# Patient Record
Sex: Male | Born: 1955 | Race: White | Hispanic: No | Marital: Married | Smoking: Former smoker
Health system: Southern US, Community
[De-identification: ages and names within clinical notes are randomized; demographics above are authoritative.]

## PROBLEM LIST (undated history)

## (undated) DIAGNOSIS — Z9289 Personal history of other medical treatment: Secondary | ICD-10-CM

## (undated) DIAGNOSIS — G4700 Insomnia, unspecified: Secondary | ICD-10-CM

## (undated) DIAGNOSIS — T7840XA Allergy, unspecified, initial encounter: Secondary | ICD-10-CM

## (undated) DIAGNOSIS — R030 Elevated blood-pressure reading, without diagnosis of hypertension: Secondary | ICD-10-CM

## (undated) DIAGNOSIS — I493 Ventricular premature depolarization: Secondary | ICD-10-CM

## (undated) DIAGNOSIS — E785 Hyperlipidemia, unspecified: Secondary | ICD-10-CM

## (undated) DIAGNOSIS — M549 Dorsalgia, unspecified: Secondary | ICD-10-CM

## (undated) DIAGNOSIS — K219 Gastro-esophageal reflux disease without esophagitis: Secondary | ICD-10-CM

## (undated) HISTORY — DX: Elevated blood-pressure reading, without diagnosis of hypertension: R03.0

## (undated) HISTORY — DX: Insomnia, unspecified: G47.00

## (undated) HISTORY — PX: TONSILLECTOMY: SUR1361

## (undated) HISTORY — DX: Gastro-esophageal reflux disease without esophagitis: K21.9

## (undated) HISTORY — PX: POLYPECTOMY: SHX149

## (undated) HISTORY — PX: CHOLECYSTECTOMY: SHX55

## (undated) HISTORY — DX: Ventricular premature depolarization: I49.3

## (undated) HISTORY — DX: Personal history of other medical treatment: Z92.89

## (undated) HISTORY — DX: Allergy, unspecified, initial encounter: T78.40XA

## (undated) HISTORY — DX: Dorsalgia, unspecified: M54.9

## (undated) HISTORY — DX: Hyperlipidemia, unspecified: E78.5

---

## 2003-04-24 DIAGNOSIS — Z9289 Personal history of other medical treatment: Secondary | ICD-10-CM

## 2003-04-24 HISTORY — DX: Personal history of other medical treatment: Z92.89

## 2005-07-06 ENCOUNTER — Ambulatory Visit: Payer: Self-pay | Admitting: Internal Medicine

## 2005-07-12 ENCOUNTER — Ambulatory Visit: Payer: Self-pay | Admitting: Internal Medicine

## 2005-07-19 ENCOUNTER — Ambulatory Visit: Payer: Self-pay | Admitting: Internal Medicine

## 2006-01-23 ENCOUNTER — Ambulatory Visit: Payer: Self-pay | Admitting: Internal Medicine

## 2006-02-06 ENCOUNTER — Ambulatory Visit: Payer: Self-pay | Admitting: Internal Medicine

## 2006-02-26 ENCOUNTER — Encounter: Admission: RE | Admit: 2006-02-26 | Discharge: 2006-02-26 | Payer: Self-pay | Admitting: Internal Medicine

## 2006-02-26 IMAGING — RF DG UGI W/ HIGH DENSITY W/KUB
19 of 22 series · 19 of 22 positions shown · non-contrast
Comparison: none

CLINICAL DATA: Difficulty swallowing.  Some reflux symptoms. 
 KUB:
 A preliminary film of the abdomen shows a nonspecific bowel gas pattern.  
 UPPER GI SERIES WITH HIGH DENSITY:
 A double contrast upper GI was performed.  The mucosa of the esophagus appears normal.  There is a small hiatal hernia present with moderate gastroesophageal reflux noted.  A barium pill was given at the end of the study which passed into the stomach without delay.  The stomach is normal in contour and peristalsis.   The duodenal bulb fills well with no ulceration but there is some duodenal bulb spasm present.  The duodenal loop is in normal position.

[Series 1: run · 1 of 1 slices shown (1 of 19)]
[im 1/1]
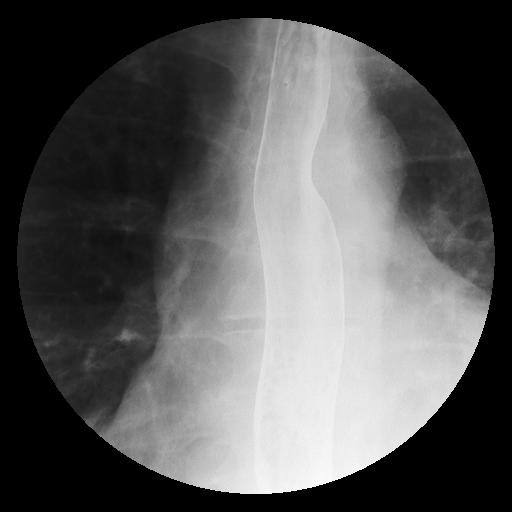

[Series 2: run · 1 of 1 slices shown (2 of 19)]
[im 1/1]
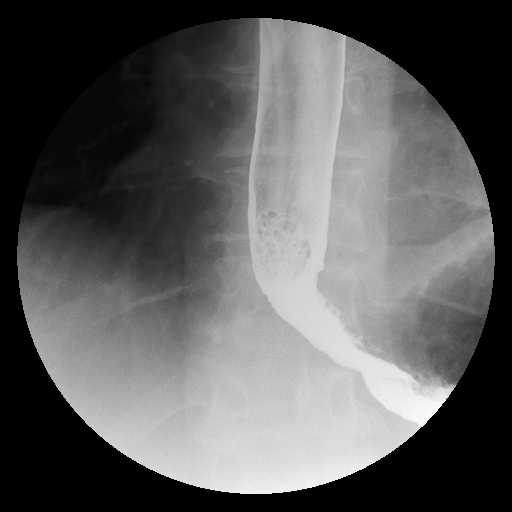

[Series 4: run · 1 of 1 slices shown (3 of 19)]
[im 1/1]
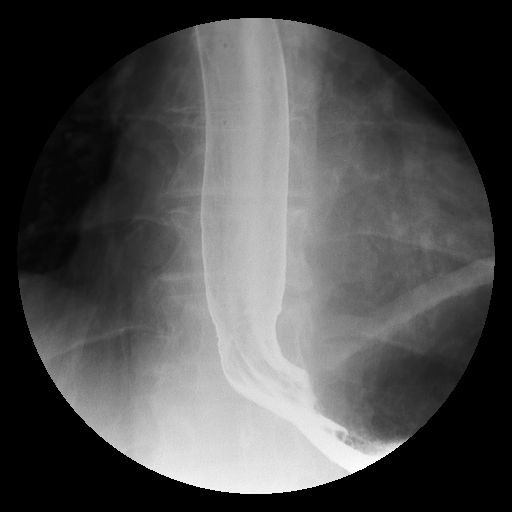

[Series 6: run · 1 of 1 slices shown (4 of 19)]
[im 1/1]
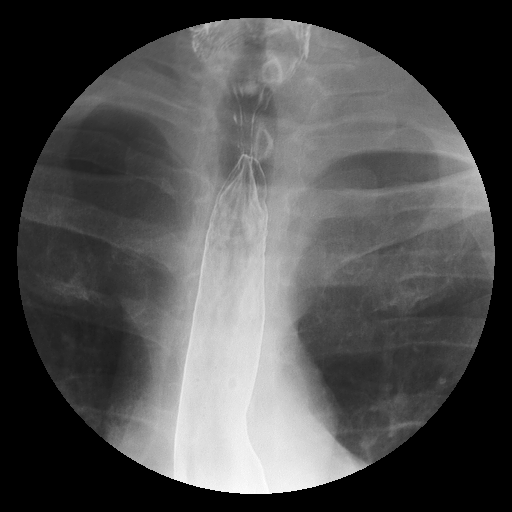

[Series 7: run · 1 of 1 slices shown (5 of 19)]
[im 1/1]
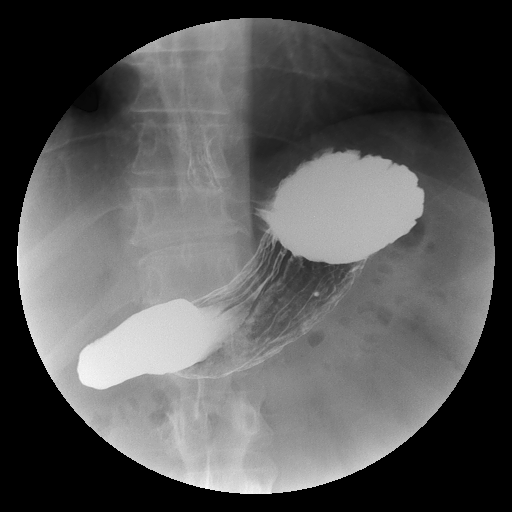

[Series 8: run · 1 of 1 slices shown (6 of 19)]
[im 1/1]
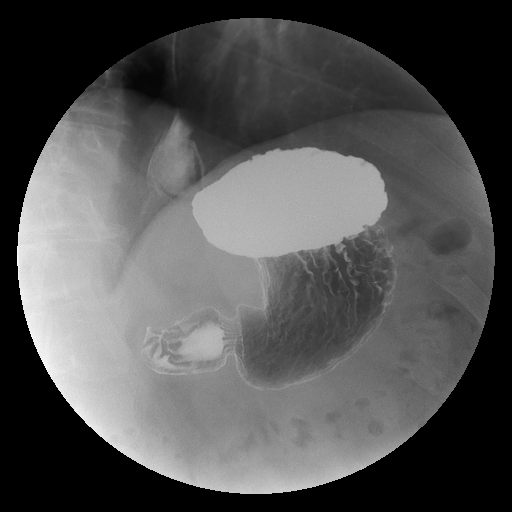

[Series 9: run · 1 of 1 slices shown (7 of 19)]
[im 1/1]
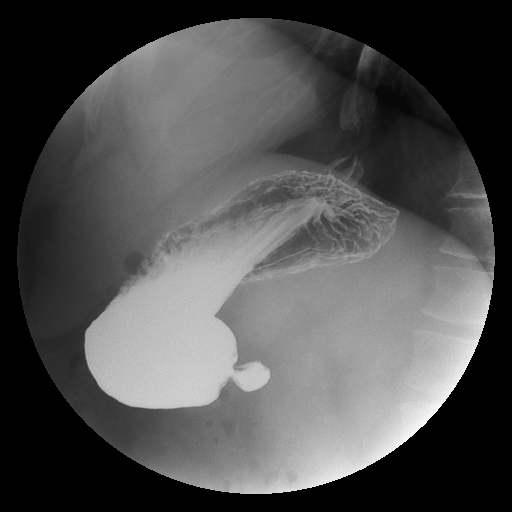

[Series 10: run · 1 of 1 slices shown (8 of 19)]
[im 1/1]
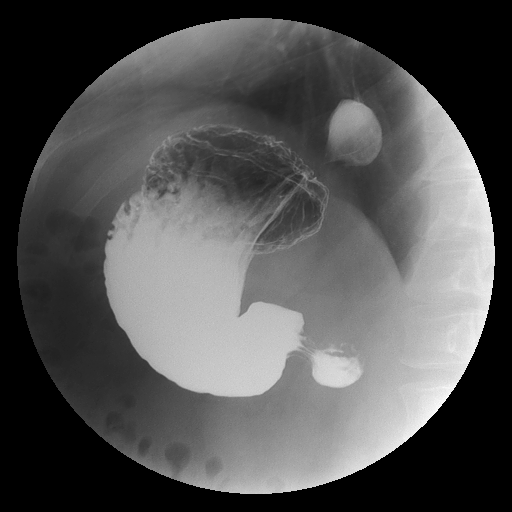

[Series 11: run · 1 of 1 slices shown (9 of 19)]
[im 1/1]
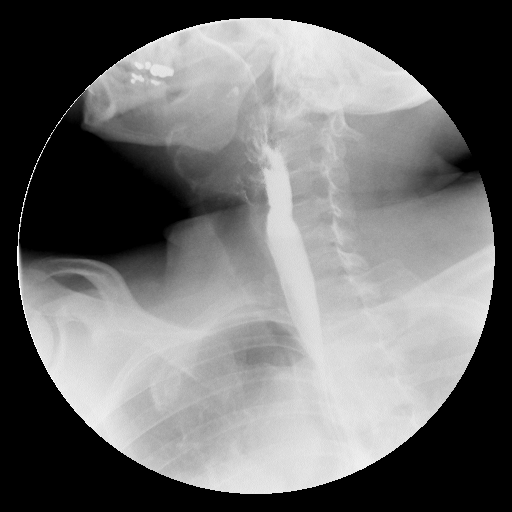

[Series 13: run · 1 of 1 slices shown (10 of 19)]
[im 1/1]
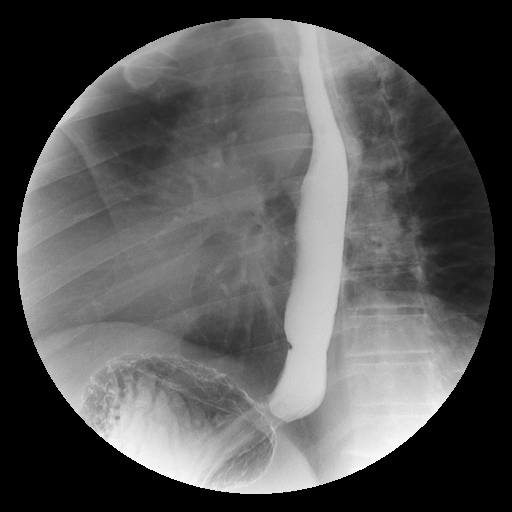

[Series 14: run · 1 of 1 slices shown (11 of 19)]
[im 1/1]
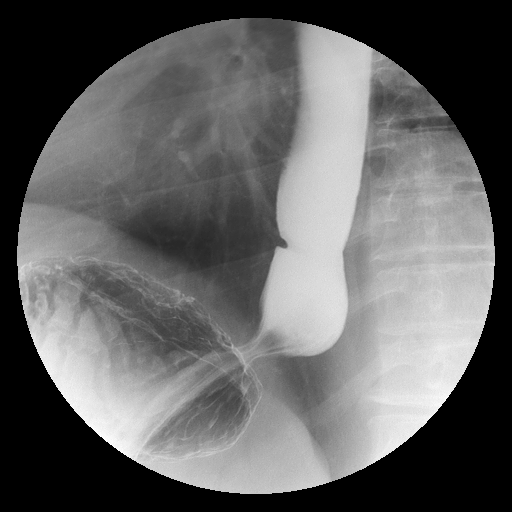

[Series 15: run · 1 of 1 slices shown (12 of 19)]
[im 1/1]
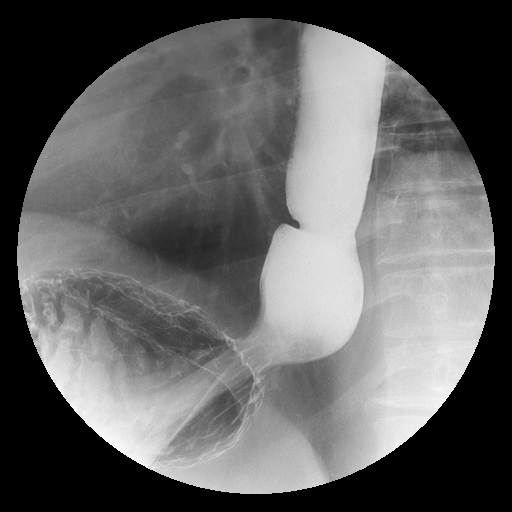

[Series 16: run · 1 of 1 slices shown (13 of 19)]
[im 1/1]
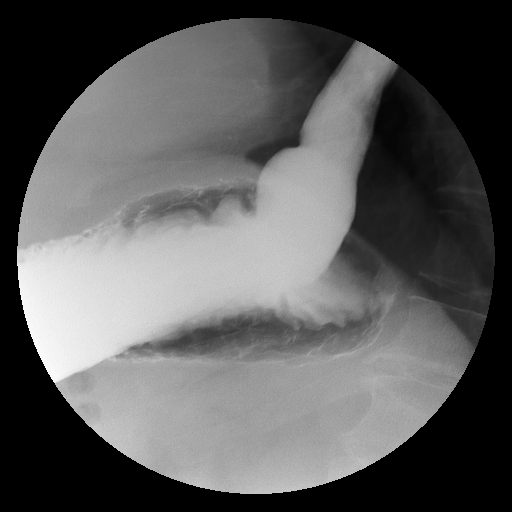

[Series 17: run · 1 of 1 slices shown (14 of 19)]
[im 1/1]
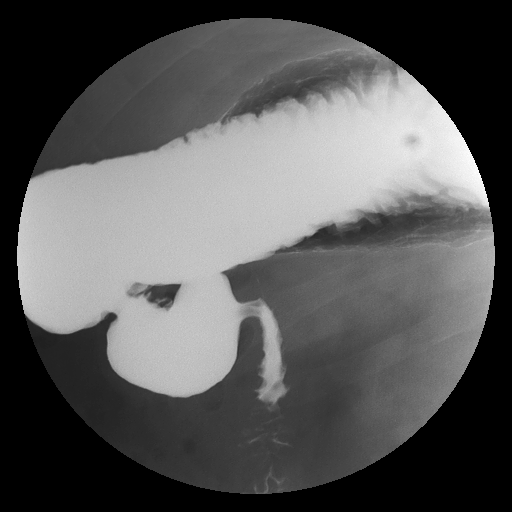

[Series 18: run · 1 of 1 slices shown (15 of 19)]
[im 1/1]
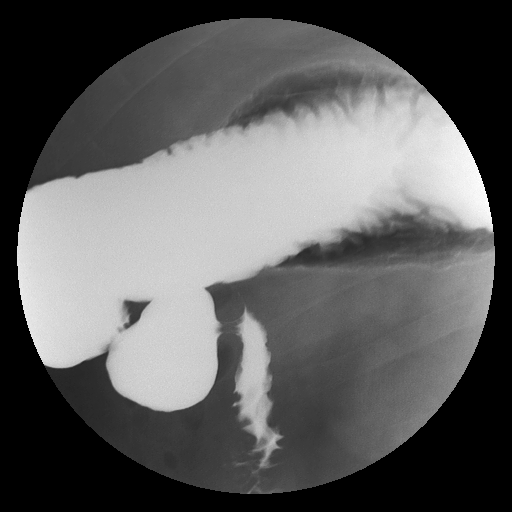

[Series 20: run · 1 of 1 slices shown (16 of 19)]
[im 1/1]
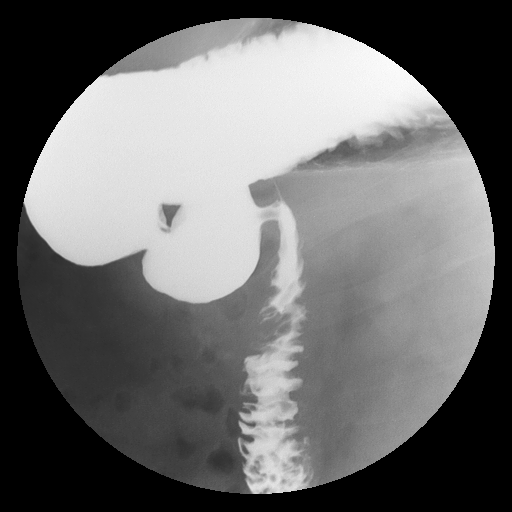

[Series 22: run · 1 of 1 slices shown (17 of 19)]
[im 1/1]
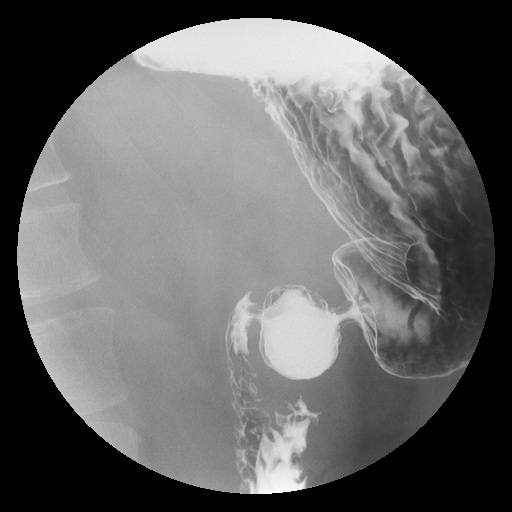

[Series 23: run · 1 of 1 slices shown (18 of 19)]
[im 1/1]
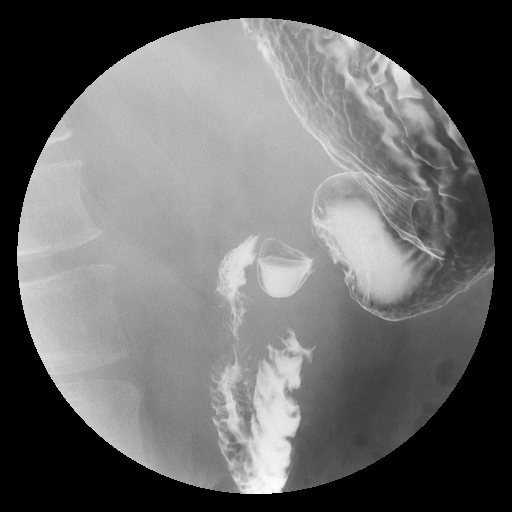

[Series 24: run · 1 of 1 slices shown (19 of 19)]
[im 1/1]
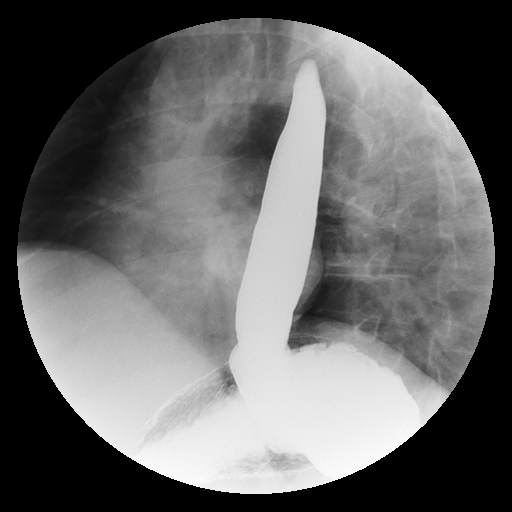

[19 of 22 positions shown; findings below may reference images not displayed]

IMPRESSION: 1.  Small hiatal hernia with moderate reflux.  Barium pill passes into the stomach without delay.
 2.  Duodenal bulb spasm but no ulcer disease is seen.

## 2006-02-26 IMAGING — CR DG UGI W/ HIGH DENSITY W/KUB
1 series · 1 of 1 positions shown · non-contrast
Comparison: none

CLINICAL DATA: Difficulty swallowing.  Some reflux symptoms. 
 KUB:
 A preliminary film of the abdomen shows a nonspecific bowel gas pattern.  
 UPPER GI SERIES WITH HIGH DENSITY:
 A double contrast upper GI was performed.  The mucosa of the esophagus appears normal.  There is a small hiatal hernia present with moderate gastroesophageal reflux noted.  A barium pill was given at the end of the study which passed into the stomach without delay.  The stomach is normal in contour and peristalsis.   The duodenal bulb fills well with no ulceration but there is some duodenal bulb spasm present.  The duodenal loop is in normal position.

[view not recorded]
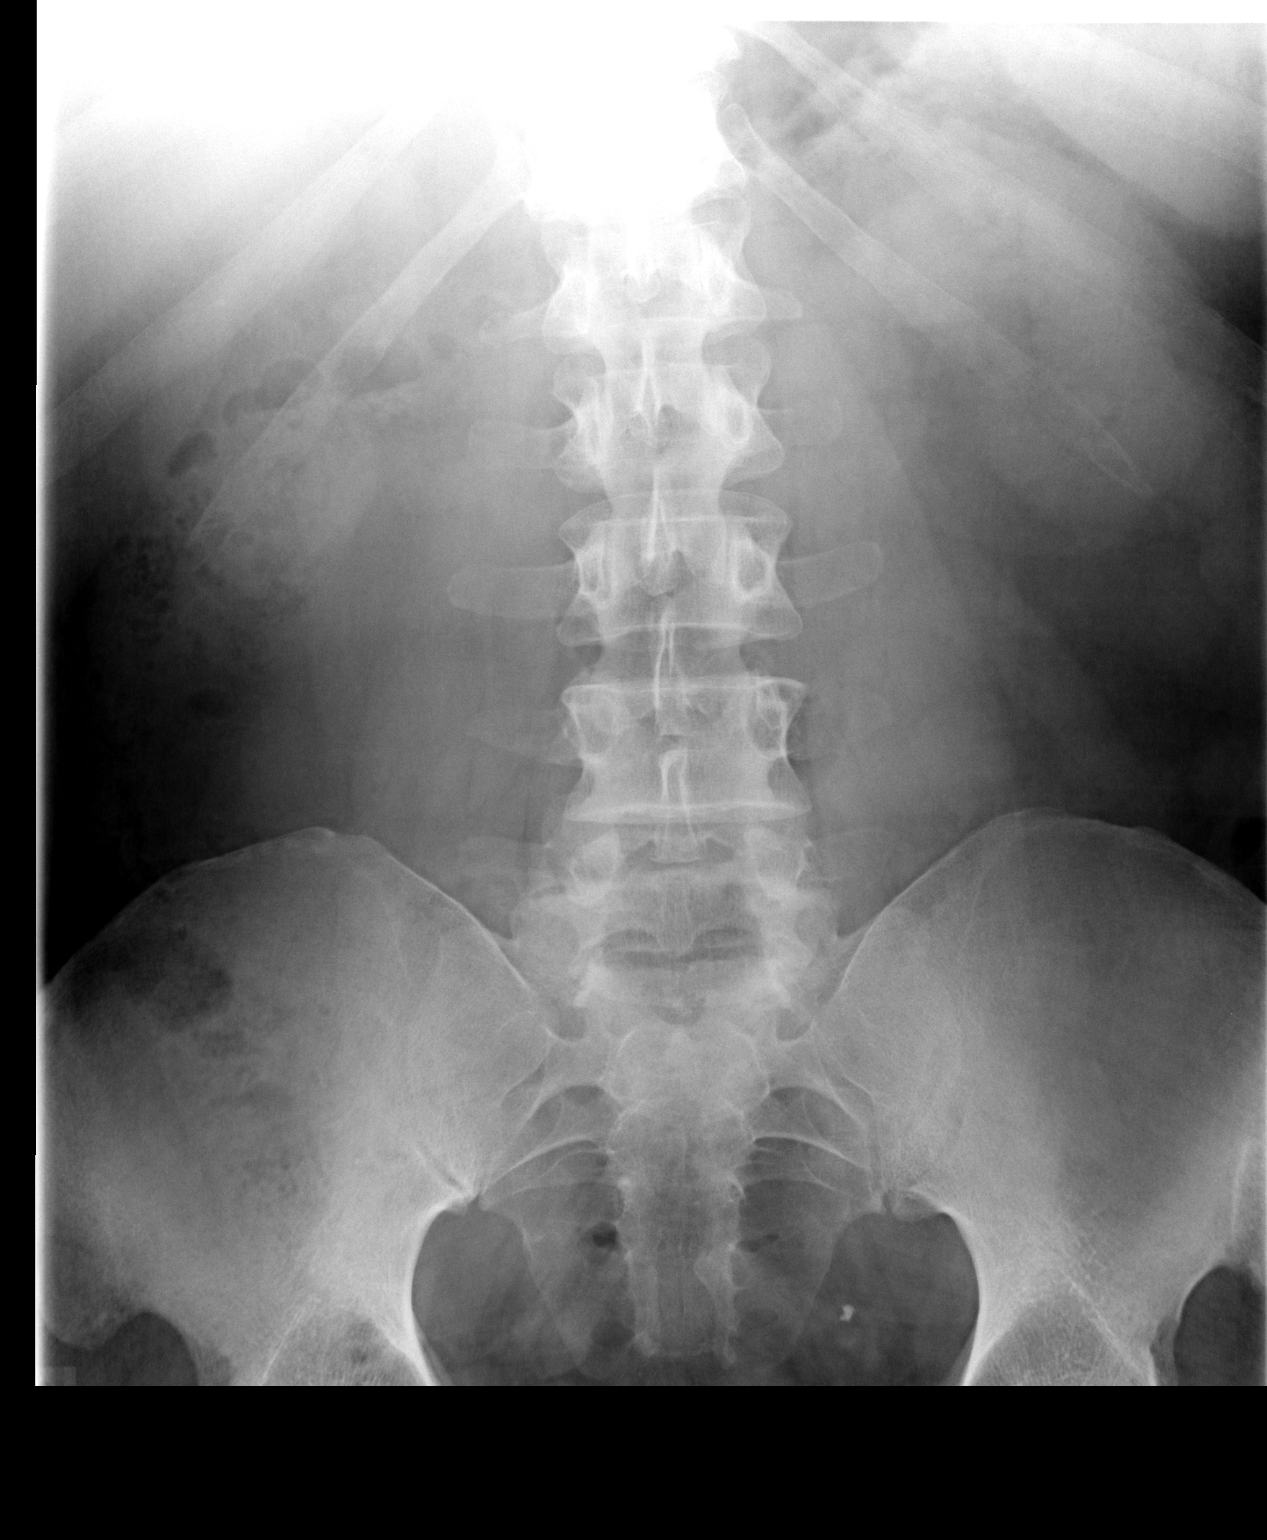

[1 of 1 positions shown; findings below may reference images not displayed]

IMPRESSION: 1.  Small hiatal hernia with moderate reflux.  Barium pill passes into the stomach without delay.
 2.  Duodenal bulb spasm but no ulcer disease is seen.

## 2006-08-13 DIAGNOSIS — E785 Hyperlipidemia, unspecified: Secondary | ICD-10-CM | POA: Insufficient documentation

## 2008-05-03 ENCOUNTER — Ambulatory Visit: Payer: Self-pay | Admitting: Internal Medicine

## 2008-05-03 ENCOUNTER — Encounter (INDEPENDENT_AMBULATORY_CARE_PROVIDER_SITE_OTHER): Payer: Self-pay | Admitting: *Deleted

## 2008-05-03 DIAGNOSIS — R030 Elevated blood-pressure reading, without diagnosis of hypertension: Secondary | ICD-10-CM | POA: Insufficient documentation

## 2008-05-03 HISTORY — DX: Elevated blood-pressure reading, without diagnosis of hypertension: R03.0

## 2008-05-05 LAB — CONVERTED CEMR LAB
ALT: 23 units/L (ref 0–53)
BUN: 19 mg/dL (ref 6–23)
Basophils Relative: 0 % (ref 0.0–3.0)
CO2: 29 meq/L (ref 19–32)
Calcium: 9.6 mg/dL (ref 8.4–10.5)
Creatinine, Ser: 1 mg/dL (ref 0.4–1.5)
Eosinophils Relative: 1.4 % (ref 0.0–5.0)
Glucose, Bld: 93 mg/dL (ref 70–99)
HCT: 42.3 % (ref 39.0–52.0)
Hemoglobin: 14 g/dL (ref 13.0–17.0)
Lymphocytes Relative: 29.1 % (ref 12.0–46.0)
Monocytes Absolute: 0.3 10*3/uL (ref 0.1–1.0)
Monocytes Relative: 6.9 % (ref 3.0–12.0)
Neutro Abs: 2.8 10*3/uL (ref 1.4–7.7)
RBC: 4.85 M/uL (ref 4.22–5.81)
TSH: 1.45 microintl units/mL (ref 0.35–5.50)
Total CHOL/HDL Ratio: 4.8
VLDL: 19 mg/dL (ref 0–40)

## 2008-06-08 ENCOUNTER — Ambulatory Visit: Payer: Self-pay | Admitting: Internal Medicine

## 2008-06-28 ENCOUNTER — Ambulatory Visit: Payer: Self-pay | Admitting: Internal Medicine

## 2008-07-02 ENCOUNTER — Encounter (INDEPENDENT_AMBULATORY_CARE_PROVIDER_SITE_OTHER): Payer: Self-pay | Admitting: *Deleted

## 2008-07-02 LAB — CONVERTED CEMR LAB: Fecal Occult Bld: NEGATIVE

## 2008-12-10 ENCOUNTER — Ambulatory Visit: Payer: Self-pay | Admitting: Internal Medicine

## 2010-03-28 ENCOUNTER — Ambulatory Visit: Payer: Self-pay | Admitting: Internal Medicine

## 2010-03-28 LAB — CONVERTED CEMR LAB
Inflenza A Ag: NEGATIVE
Influenza B Ag: NEGATIVE
Rapid Strep: NEGATIVE

## 2010-05-12 ENCOUNTER — Ambulatory Visit: Admit: 2010-05-12 | Payer: Self-pay | Admitting: Internal Medicine

## 2010-05-23 NOTE — Assessment & Plan Note (Signed)
Summary: flu-like symptoms/cbs   Vital Signs:  Patient profile:   55 year old male Height:      71 inches Weight:      216.13 pounds BMI:     30.25 O2 Sat:      97 % on Room air Temp:     99.8 degrees F oral Pulse rate:   83 / minute Pulse rhythm:   regular BP sitting:   122 / 80  (left arm) Cuff size:   large  Vitals Entered By: Army Fossa CMA (March 28, 2010 3:48 PM)  O2 Flow:  Room air CC: Pt here c/o flu like symptoms. Comments - chills - achey -dizziness - x 1 week [facility] [address]   History of Present Illness: "mild flu symptoms" x 1 week: aches, chils at night , slightly  dizzy not taking any meds   Current Medications (verified): 1)  None  Allergies (verified): No Known Drug Allergies  Past History:  Past Medical History: Reviewed history from 08/13/2006 and no changes required. Hyperlipidemia  Past Surgical History: Reviewed history from 05/03/2008 and no changes required. CARDIOLITE - NEG 2005  Social History: Married 2 kids tobacco--no  Review of Systems General:  mild fever  ~100 on- off. ENT:  Denies sore throat; no sinus congestion. CV:  mild cough, no sputum  no chest congestion . GI:  Denies nausea and vomiting. Neuro:  HA x 2 days , no intense .  Physical Exam  General:  alert, well-developed, and well-nourished.   Head:  face symetric , no tender  Ears:  R ear normal and L ear normal.   Nose:  not congested  Mouth:  tonsils absent, no d/c,slt red  Lungs:  normal respiratory effort, no intercostal retractions, no accessory muscle use, and normal breath sounds.   Heart:  normal rate, regular rhythm, and no murmur.     Impression & Recommendations:  Problem # 1:  VIRAL INFECTION-UNSPEC (ICD-079.99)  likely a viral illnes flu test neg check a strep test--- neg  see instructions  if no better consider a CBC ,  CXR  Orders: Flu A+B (16109) Rapid Strep (60454)  Patient Instructions: 1)  rest, fluids, tylenol ,  ibuprofen 2)  mucinex DM twice a day as needed 3)  call if no better in 4 to 5 days    Orders Added: 1)  Flu A+B [87400] 2)  Est. Patient Level III [09811] 3)  Rapid Strep [91478]    Laboratory Results    Other Tests  Rapid Strep: negative Influenza A: negative Influenza B: negative Comments: Army Fossa CMA  March 28, 2010 3:50 PM

## 2010-06-07 ENCOUNTER — Encounter: Payer: Self-pay | Admitting: Internal Medicine

## 2010-06-07 ENCOUNTER — Other Ambulatory Visit: Payer: Self-pay | Admitting: Internal Medicine

## 2010-06-07 ENCOUNTER — Encounter (INDEPENDENT_AMBULATORY_CARE_PROVIDER_SITE_OTHER): Payer: BC Managed Care – PPO | Admitting: Internal Medicine

## 2010-06-07 DIAGNOSIS — G4700 Insomnia, unspecified: Secondary | ICD-10-CM | POA: Insufficient documentation

## 2010-06-07 DIAGNOSIS — E785 Hyperlipidemia, unspecified: Secondary | ICD-10-CM

## 2010-06-07 DIAGNOSIS — Z0000 Encounter for general adult medical examination without abnormal findings: Secondary | ICD-10-CM

## 2010-06-07 LAB — BASIC METABOLIC PANEL
CO2: 28 mEq/L (ref 19–32)
Calcium: 9 mg/dL (ref 8.4–10.5)
Creatinine, Ser: 0.9 mg/dL (ref 0.4–1.5)
GFR: 89.67 mL/min (ref >60.00)

## 2010-06-07 LAB — LIPID PANEL
HDL: 40.8 mg/dL (ref >39.00)
Total CHOL/HDL Ratio: 6
VLDL: 42.2 mg/dL — ABNORMAL HIGH (ref 0.0–40.0)

## 2010-06-07 LAB — LDL CHOLESTEROL, DIRECT: Direct LDL: 156.9 mg/dL

## 2010-06-07 LAB — HEPATIC FUNCTION PANEL
Alkaline Phosphatase: 43 U/L (ref 39–117)
Bilirubin, Direct: 0.1 mg/dL (ref 0.0–0.3)

## 2010-06-08 LAB — CBC WITH DIFFERENTIAL/PLATELET
Basophils Absolute: 0 10*3/uL (ref 0.0–0.1)
Basophils Relative: 0.4 % (ref 0.0–3.0)
Eosinophils Absolute: 0.1 10*3/uL (ref 0.0–0.7)
Lymphocytes Relative: 38.1 % (ref 12.0–46.0)
MCHC: 34.8 g/dL (ref 30.0–36.0)
Neutrophils Relative %: 51.5 % (ref 43.0–77.0)
RBC: 4.7 Mil/uL (ref 4.22–5.81)

## 2010-06-14 NOTE — Assessment & Plan Note (Signed)
Summary: CPX AND FASTING LABS/SPH/PH   Vital Signs:  Patient profile:   55 year old male Height:      71 inches Weight:      211.50 pounds BMI:     29.60 Pulse rate:   78 / minute Pulse rhythm:   regular BP sitting:   128 / 84  (left arm) Cuff size:   large  Vitals Entered By: Army Fossa CMA (June 07, 2010 8:31 AM) CC: CPX, fasting  Comments no complaints  [facility] [address]  not had colonoscopy   History of Present Illness: CPX  Preventive Screening-Counseling & Management  Caffeine-Diet-Exercise     Does Patient Exercise: yes     Times/week: 3  Current Medications (verified): 1)  None  Allergies (verified): No Known Drug Allergies  Past History:  Past Medical History: Reviewed history from 08/13/2006 and no changes required. Hyperlipidemia  Past Surgical History: Reviewed history from 05/03/2008 and no changes required. CARDIOLITE - NEG 2005  Family History: CAD-- F, dx in his 37s. Smoker  hyperlipidemia -- F, M stroke - PGM lung Ca--F   HTN-- M DM -- distant family  colon Ca - no prostate Ca - no  Social History: Married 2 kids tobacco--no ETOH-- socially exercise-- 2-3/week [employer]) diet--  eats well most of the time  job-- Research scientist (medical) , travels a lot Does Patient Exercise:  yes  Review of Systems CV:  Denies chest pain or discomfort, palpitations, and swelling of feet. Resp:  Denies shortness of breath; occasionally cough @ night and chest congestion (sporadically) wonders if is a enviromental issue @ home . GI:  Denies bloody stools, diarrhea, nausea, and vomiting. GU:  Denies dysuria, hematuria, urinary frequency, and urinary hesitancy. Psych:  Denies anxiety and depression.  Physical Exam  General:  alert, well-developed, and well-nourished.   Neck:  no masses, no thyromegaly, and normal carotid upstroke.   Lungs:  normal respiratory effort, no intercostal retractions, no accessory muscle use, and normal breath  sounds.   Heart:  normal rate, regular rhythm, and no murmur.   Abdomen:  soft, non-tender, no distention, no masses, no guarding, and no rigidity.   Rectal:  few external hemorrhoids and skin tags noted. Normal sphincter tone. No rectal masses or tenderness. Brown stool, Hemoccult negative Prostate:  Prostate gland firm and smooth, no enlargement, nodularity, tenderness, mass, asymmetry or induration. Extremities:  no lower extremity edema   Impression & Recommendations:  Problem # 1:  HEALTH SCREENING (ICD-V70.0)  Td 07 had a flu shot  never had a cscope Hemoccult negative today, would like to schedule a colonoscopy for the summer,  referral done   Has a family history of heart disease, LDL goal should be less than 130, hopefully 100. Labs. The patient will consider statins if needed. diet &  exercise discussed EKG normal sinus rhythm, unchanged compared to previous EKGs  Orders: Venipuncture (16109) TLB-BMP (Basic Metabolic Panel-BMET) (80048-METABOL) TLB-CBC Platelet - w/Differential (85025-CBCD) TLB-Hepatic/Liver Function Pnl (80076-HEPATIC) TLB-Lipid Panel (80061-LIPID) TLB-PSA (Prostate Specific Antigen) (84153-PSA) TLB-TSH (Thyroid Stimulating Hormone) (84443-TSH) Specimen Handling (60454) EKG w/ Interpretation (93000) Gastroenterology Referral (GI)  Problem # 2:  INSOMNIA-SLEEP DISORDER-UNSPEC (ICD-780.52)  due to his job, he travels to [place] a lote, likes a sleep aid. In the past he tried [place] and he left a metallic taste  Does not like Xanax particularly trial with Ambien  His updated medication list for this problem includes:    Zolpidem Tartrate 10 Mg Tabs (Zolpidem tartrate) .Marland KitchenMarland KitchenMarland KitchenMarland Kitchen 1  at night as needed for insomnia  Patient Instructions: 1)  Please schedule a follow-up appointment in 1 year.  Prescriptions: ZOLPIDEM TARTRATE 10 MG TABS (ZOLPIDEM TARTRATE) 1 at night as needed for insomnia  #30 x 1   Entered and Authorized by:   Elita Quick E. Regan Mcbryar MD   Signed  by:   Nolon Rod. Kodi Guerrera MD on 06/07/2010   Method used:   Print then Give to Patient   RxID:   [id number]    Orders Added: 1)  Venipuncture [56213] 2)  TLB-BMP (Basic Metabolic Panel-BMET) [80048-METABOL] 3)  TLB-CBC Platelet - w/Differential [85025-CBCD] 4)  TLB-Hepatic/Liver Function Pnl [80076-HEPATIC] 5)  TLB-Lipid Panel [80061-LIPID] 6)  TLB-PSA (Prostate Specific Antigen) [84153-PSA] 7)  TLB-TSH (Thyroid Stimulating Hormone) [84443-TSH] 8)  Specimen Handling [99000] 9)  EKG w/ Interpretation [93000] 10)  Est. Patient age 4-64 [phone] 4)  Gastroenterology Referral [GI]   Immunization History:  Influenza Immunization History:    Influenza:  historical (01/21/2010)   Immunization History:  Influenza Immunization History:    Influenza:  Historical (01/21/2010)   Risk Factors:  Exercise:  yes    Times per week:  3

## 2010-07-21 ENCOUNTER — Other Ambulatory Visit (INDEPENDENT_AMBULATORY_CARE_PROVIDER_SITE_OTHER): Payer: BC Managed Care – PPO

## 2010-07-21 DIAGNOSIS — E785 Hyperlipidemia, unspecified: Secondary | ICD-10-CM

## 2010-07-21 LAB — ALT: ALT: 32 U/L (ref 0–53)

## 2010-07-21 LAB — LIPID PANEL
Cholesterol: 188 mg/dL (ref 0–200)
HDL: 39 mg/dL — ABNORMAL LOW (ref >39.00)
LDL Cholesterol: 109 mg/dL — ABNORMAL HIGH (ref 0–99)
Total CHOL/HDL Ratio: 5
Triglycerides: 199 mg/dL — ABNORMAL HIGH (ref 0.0–149.0)
VLDL: 39.8 mg/dL (ref 0.0–40.0)

## 2010-07-24 ENCOUNTER — Telehealth: Payer: Self-pay | Admitting: *Deleted

## 2010-07-24 MED ORDER — ATORVASTATIN CALCIUM 20 MG PO TABS: 20.0000 mg | ORAL_TABLET | Freq: Every day | ORAL | Status: DC

## 2010-07-24 NOTE — Telephone Encounter (Signed)
Message copied by Army Fossa on Mon Jul 24, 2010  3:16 PM ------      Message from: Willow Ora      Created: Mon Jul 24, 2010  2:48 PM       Advised patient      His cholesterol has improved significantly with Lipitor 20 mg daily.      The LDL is now 109, well below 130.      Triglycerides are still elevated      I recommend him to continue with Lipitor, come back in 8 months for a checkup.      Call a 1 year supply of meds

## 2010-07-24 NOTE — Telephone Encounter (Signed)
Message left for patient to return my call.  

## 2010-07-24 NOTE — Telephone Encounter (Signed)
Pt aware of labs and rx sent to pharmacy.

## 2010-09-25 ENCOUNTER — Ambulatory Visit (AMBULATORY_SURGERY_CENTER): Payer: BC Managed Care – PPO | Admitting: *Deleted

## 2010-09-25 ENCOUNTER — Encounter: Payer: Self-pay | Admitting: Gastroenterology

## 2010-09-25 VITALS — Ht 71.0 in | Wt 212.2 lb

## 2010-09-25 DIAGNOSIS — Z1211 Encounter for screening for malignant neoplasm of colon: Secondary | ICD-10-CM

## 2010-09-25 MED ORDER — PEG-KCL-NACL-NASULF-NA ASC-C 100 G PO SOLR: ORAL | Status: DC

## 2010-10-09 ENCOUNTER — Ambulatory Visit (AMBULATORY_SURGERY_CENTER): Payer: BC Managed Care – PPO | Admitting: Gastroenterology

## 2010-10-09 ENCOUNTER — Encounter: Payer: Self-pay | Admitting: Gastroenterology

## 2010-10-09 VITALS — BP 146/71 | HR 67 | Temp 97.0°F | Resp 20 | Ht 71.0 in | Wt 211.0 lb

## 2010-10-09 DIAGNOSIS — D126 Benign neoplasm of colon, unspecified: Secondary | ICD-10-CM

## 2010-10-09 DIAGNOSIS — Z139 Encounter for screening, unspecified: Secondary | ICD-10-CM

## 2010-10-09 DIAGNOSIS — Z1211 Encounter for screening for malignant neoplasm of colon: Secondary | ICD-10-CM

## 2010-10-09 MED ORDER — SODIUM CHLORIDE 0.9 % IV SOLN: 500.0000 mL | INTRAVENOUS | Status: DC

## 2010-10-09 NOTE — Patient Instructions (Signed)
Green and blue discharge instructions reviewed with patient and care partner.  Handout on polyps given.  Continue medications as you were previously taking them prior to your procedure.

## 2010-10-10 ENCOUNTER — Telehealth: Payer: Self-pay | Admitting: *Deleted

## 2010-10-10 NOTE — Telephone Encounter (Signed)

## 2011-03-22 ENCOUNTER — Encounter (HOSPITAL_BASED_OUTPATIENT_CLINIC_OR_DEPARTMENT_OTHER): Payer: Self-pay | Admitting: *Deleted

## 2011-03-22 ENCOUNTER — Emergency Department (HOSPITAL_BASED_OUTPATIENT_CLINIC_OR_DEPARTMENT_OTHER)
Admission: EM | Admit: 2011-03-22 | Discharge: 2011-03-22 | Disposition: A | Discharge status: Home / Self Care | Payer: BC Managed Care – PPO | Attending: Emergency Medicine | Admitting: Emergency Medicine

## 2011-03-22 DIAGNOSIS — R51 Headache: Secondary | ICD-10-CM | POA: Insufficient documentation

## 2011-03-22 DIAGNOSIS — K219 Gastro-esophageal reflux disease without esophagitis: Secondary | ICD-10-CM | POA: Insufficient documentation

## 2011-03-22 DIAGNOSIS — E785 Hyperlipidemia, unspecified: Secondary | ICD-10-CM | POA: Insufficient documentation

## 2011-03-22 DIAGNOSIS — R1110 Vomiting, unspecified: Secondary | ICD-10-CM

## 2011-03-22 MED ORDER — SODIUM CHLORIDE 0.9 % IV BOLUS (SEPSIS)
1000.0000 mL | Freq: Once | INTRAVENOUS | Status: DC
  Administered 2011-03-22: 1000 mL via INTRAVENOUS

## 2011-03-22 MED ORDER — ONDANSETRON HCL 4 MG/2ML IJ SOLN
INTRAMUSCULAR | Status: AC
  Administered 2011-03-22: 4 mg via INTRAVENOUS
  Filled 2011-03-22: qty 2

## 2011-03-22 MED ORDER — SODIUM CHLORIDE 0.9 % IV BOLUS (SEPSIS)
1000.0000 mL | Freq: Once | INTRAVENOUS | Status: AC
  Administered 2011-03-22: 1000 mL via INTRAVENOUS

## 2011-03-22 MED ORDER — METOCLOPRAMIDE HCL 10 MG PO TABS: 10.0000 mg | ORAL_TABLET | Freq: Four times a day (QID) | ORAL | Status: AC | PRN

## 2011-03-22 MED ORDER — OXYCODONE-ACETAMINOPHEN 5-325 MG PO TABS: 1.0000 | ORAL_TABLET | ORAL | Status: AC | PRN

## 2011-03-22 MED ORDER — DIPHENHYDRAMINE HCL 50 MG/ML IJ SOLN
25.0000 mg | Freq: Once | INTRAMUSCULAR | Status: AC
  Administered 2011-03-22: 25 mg via INTRAVENOUS
  Filled 2011-03-22: qty 1

## 2011-03-22 MED ORDER — SODIUM CHLORIDE 0.9 % IV BOLUS (SEPSIS)
1000.0000 mL | Freq: Once | INTRAVENOUS | Status: DC
Start: 2011-03-22 — End: 2011-03-22

## 2011-03-22 MED ORDER — HYDROMORPHONE HCL PF 1 MG/ML IJ SOLN
1.0000 mg | Freq: Once | INTRAMUSCULAR | Status: AC
  Administered 2011-03-22: 1 mg via INTRAVENOUS
  Filled 2011-03-22: qty 1

## 2011-03-22 MED ORDER — ONDANSETRON HCL 4 MG/2ML IJ SOLN
4.0000 mg | Freq: Once | INTRAMUSCULAR | Status: AC
  Administered 2011-03-22: 4 mg via INTRAVENOUS

## 2011-03-22 MED ORDER — METOCLOPRAMIDE HCL 5 MG/ML IJ SOLN
10.0000 mg | Freq: Once | INTRAMUSCULAR | Status: AC
  Administered 2011-03-22: 10 mg via INTRAVENOUS
  Filled 2011-03-22: qty 2

## 2011-03-22 NOTE — ED Notes (Signed)
Pt sts he was seen at [facility] today for back pain and received prescriptions for tramadol, flexeril and meloxicam. Pt sts since taking the meds this afternoon, he has developed a severe head ache and N/V. Pt has hx of migraines.

## 2011-03-22 NOTE — ED Provider Notes (Signed)
History     CSN: [id number] Arrival date & time: 11/Shane/2012  2:41 AM   First MD Initiated Contact with Patient 11/Shane/12 0319      Chief Complaint  Patient presents with  . Emesis  . Headache    (Consider location/radiation/quality/duration/timing/severity/associated sxs/prior treatment) Patient is a 55 y.o. Wood presenting with vomiting and headaches. The history is provided by the patient.  Emesis  Associated symptoms include headaches.  Headache  Associated symptoms include vomiting.   55 year old Wood developed a headache tonight at about 8 PM. He had seen a physician earlier today for lower back pain which she felt was related to having moved some Christmas trees several days ago. He was given prescriptions for meloxicam, tramadol, and cyclobenzaprine. He took a dose of tramadol and meloxicam this afternoon, and then this evening took a dose of tramadol. He then started developing a bitemporal throbbing headache which is rated at 9/10. He has a history of migraines and states that this headache feels similar, although there is no photophobia associated with this headache. There has been associated nausea and vomiting. Nothing makes the headache better nothing seems to make it worse. His back is feeling a little better after the medication. He has taken NSAIDs and cyclobenzaprine in the past and, and he is taking narcotic analgesics in the past without problems with headache or vomiting.  Past Medical History  Diagnosis Date  . Hyperlipidemia   . GERD (gastroesophageal reflux disease)   . Migraines     Past Surgical History  Procedure Date  . Tonsillectomy   . Wisdom tooth extraction     Family History  Problem Relation Age of Onset  . Lung cancer Father   . Diabetes Maternal Grandmother   . Diabetes Maternal Grandfather     History  Substance Use Topics  . Smoking status: Former Smoker    Quit date: 09/24/1988  . Smokeless tobacco: Not on file  . Alcohol Use: 2.4  oz/week    4 Glasses of wine per week      Review of Systems  Gastrointestinal: Positive for vomiting.  Neurological: Positive for headaches.  All other systems reviewed and are negative.    Allergies  Review of patient's allergies indicates no known allergies.  Home Medications   Current Outpatient Rx  Name Route Sig Dispense Refill  . CYCLOBENZAPRINE HCL 5 MG PO TABS Oral Take 5 mg by mouth at bedtime as needed.      . MELOXICAM 15 MG PO TABS Oral Take 15 mg by mouth daily.      . TRAMADOL HCL 50 MG PO TABS Oral Take 50 mg by mouth 2 (two) times daily. Maximum dose= 8 tablets per day     . ATORVASTATIN CALCIUM 20 MG PO TABS Oral Take 1 tablet (20 mg total) by mouth daily. 30 tablet 11  . ONE-DAILY MULTI VITAMINS PO TABS Oral Take 1 tablet by mouth daily.      [name] PEG-KCL-NACL-NASULF-NA ASC-C 100 G PO SOLR  Moviprep as directed 1 kit 0    Dispense as written.    BP 146/88  Pulse 83  Resp 20  SpO2 100%  Physical Exam  Nursing note and vitals reviewed.  55 year old Wood who appears uncomfortable. Vital signs are normal. Head is normocephalic and atraumatic. PERRLA, EOMI. Fundi show no hemorrhage, exudate, or papilledema. Oropharynx is clear. Neck is supple without adenopathy or JVD. Back is nontender without any palpable spasm. There is a negative straight leg raise. Lungs are  clear without rales, wheezes, rhonchi. Heart has regular rate rhythm without murmur. There is no chest wall tenderness. Abdomen is soft, flat, nontender without masses or hepatosplenomegaly extremities have no cyanosis or edema, full range of motion present. Skin is warm and moist without rashes. Neurologic: mental status is normal, cranial nerves are intact, there  are no motor  were sensory  deficits. Psychiatric: No abnormalities of mood or affect.   ED Course  Procedures (including critical care time)   Labs Reviewed  CBC  DIFFERENTIAL  COMPREHENSIVE METABOLIC PANEL   No results found.   No  diagnosis found. Reevaluation at 4:55 AM following IV fluids, IV Reglan, IV Toradol, and IV Benadryl: Headache is somewhat improved but still moderate in intensity. You will be given IV Dilaudid to try and get better pain relief.  Reevaluation at 5:25 AM: Headache is completely gone. The patient states that he is ready to go home. MDM   most likely migraine headache. Also lumbar strain.         Dione Booze, MD 11/Shane/12 [phone]

## 2012-06-02 ENCOUNTER — Ambulatory Visit (INDEPENDENT_AMBULATORY_CARE_PROVIDER_SITE_OTHER): Payer: BC Managed Care – PPO | Admitting: Internal Medicine

## 2012-06-02 ENCOUNTER — Encounter: Payer: Self-pay | Admitting: Internal Medicine

## 2012-06-02 VITALS — BP 140/84 | HR 69 | Temp 98.6°F | Ht 71.0 in | Wt 220.0 lb

## 2012-06-02 DIAGNOSIS — E785 Hyperlipidemia, unspecified: Secondary | ICD-10-CM

## 2012-06-02 DIAGNOSIS — Z0000 Encounter for general adult medical examination without abnormal findings: Secondary | ICD-10-CM

## 2012-06-02 LAB — CBC WITH DIFFERENTIAL/PLATELET
Basophils Relative: 0.3 % (ref 0.0–3.0)
Eosinophils Relative: 4.4 % (ref 0.0–5.0)
HCT: 40.8 % (ref 39.0–52.0)
Hemoglobin: 13.8 g/dL (ref 13.0–17.0)
Lymphs Abs: 1.4 10*3/uL (ref 0.7–4.0)
MCV: 84.4 fl (ref 78.0–100.0)
Monocytes Absolute: 0.5 10*3/uL (ref 0.1–1.0)
Neutro Abs: 3.7 10*3/uL (ref 1.4–7.7)
RBC: 4.84 Mil/uL (ref 4.22–5.81)
WBC: 5.8 10*3/uL (ref 4.5–10.5)

## 2012-06-02 LAB — LIPID PANEL
Total CHOL/HDL Ratio: 6
Triglycerides: 380 mg/dL — ABNORMAL HIGH (ref 0.0–149.0)

## 2012-06-02 LAB — COMPREHENSIVE METABOLIC PANEL
Alkaline Phosphatase: 60 U/L (ref 39–117)
BUN: 16 mg/dL (ref 6–23)
Glucose, Bld: 92 mg/dL (ref 70–99)
Total Bilirubin: 0.6 mg/dL (ref 0.3–1.2)

## 2012-06-02 LAB — LDL CHOLESTEROL, DIRECT: Direct LDL: 96.3 mg/dL

## 2012-06-02 LAB — TSH: TSH: 2.42 u[IU]/mL (ref 0.35–5.50)

## 2012-06-02 NOTE — Assessment & Plan Note (Addendum)
Td 07 had a flu shot Colonoscopy 09/2010, Dr. Christella Hartigan. Next per GI. Has a family history of heart disease, he has a very healthy lifestyle, negative Cardiolite 2005. EKG last year at baseline. Plan: Labs Continue healthy lifestyle.  Pt wonders about checking a testosterone , he has no symptoms except occasional fatigue. We agreed not to test however he will call if interested.

## 2012-06-02 NOTE — Progress Notes (Signed)
  Subjective:    Patient ID: Shane Wood, male    DOB: 1955-10-10, 57 y.o.   MRN: [id number]  HPI CPX Last OV 05-2010 Doing well, having a URI, mild symptoms, no fever.   Past Medical History  Diagnosis Date  . Hyperlipidemia   . H/O cardiovascular stress test 2005     (-)   Past Surgical History  Procedure Laterality Date  . No past surgeries      Family History: CAD-- F, dx in his 17s. Smoker  hyperlipidemia -- F, M stroke - PGM lung Ca--F  HTN-- M DM -- distant family  colon Ca - no prostate Ca - no  Social History: Married, 2 kids tobacco--quit in the early 90s ETOH-- socially exercise-- going to the gym 6/week [employer]) diet-- healthy , counting  calories  job-- Research scientist (medical)    Review of Systems  Respiratory: Negative for cough and shortness of breath.   Cardiovascular: Negative for chest pain and leg swelling.  Gastrointestinal: Negative for abdominal pain and blood in stool.  Genitourinary: Negative for hematuria and difficulty urinating.  Psychiatric/Behavioral:       No anxiety-depression, ++ stress        Objective:   Physical Exam General -- alert, well-developed, BMI 30 .   Neck --no thyromegaly , normal carotid pulse Lungs -- normal respiratory effort, no intercostal retractions, no accessory muscle use, and normal breath sounds.   Heart-- normal rate, regular rhythm, no murmur, and no gallop.   Abdomen--soft, non-tender, no distention, no masses, no HSM, no guarding, and no rigidity.   Extremities-- no pretibial edema bilaterally Rectal-- single skin tag noted at the perianal area. Normal sphincter tone. No rectal masses or tenderness. Brown stool. Prostate:  Prostate gland firm and smooth, no enlargement, nodularity, tenderness, mass, asymmetry or induration. Neurologic-- alert & oriented X3 and strength normal in all extremities. Psych-- Cognition and judgment appear intact. Alert and cooperative with normal attention span and  concentration.  not anxious appearing and not depressed appearing.       Assessment & Plan:

## 2012-06-02 NOTE — Assessment & Plan Note (Addendum)
based on the last cholesterol panel 2 years ago  was prescribed Lipitor which he took temporarily, cholesterol improved significantly. He felt slightly fatigued but no myalgias. Eventually self discontinue meds.

## 2012-06-02 NOTE — Patient Instructions (Addendum)
Check the  blood pressure 2 or 3 times a week, be sure it is between 110/60 and 140/85. If it is consistently higher or lower, let me know  

## 2012-06-07 ENCOUNTER — Encounter: Payer: Self-pay | Admitting: Internal Medicine

## 2012-06-07 ENCOUNTER — Other Ambulatory Visit: Payer: Self-pay

## 2013-02-26 ENCOUNTER — Other Ambulatory Visit: Payer: Self-pay

## 2013-12-18 ENCOUNTER — Ambulatory Visit (INDEPENDENT_AMBULATORY_CARE_PROVIDER_SITE_OTHER): Payer: BC Managed Care – PPO | Admitting: Internal Medicine

## 2013-12-18 ENCOUNTER — Encounter: Payer: Self-pay | Admitting: Internal Medicine

## 2013-12-18 ENCOUNTER — Encounter (HOSPITAL_BASED_OUTPATIENT_CLINIC_OR_DEPARTMENT_OTHER): Payer: Self-pay | Admitting: Emergency Medicine

## 2013-12-18 ENCOUNTER — Emergency Department (HOSPITAL_BASED_OUTPATIENT_CLINIC_OR_DEPARTMENT_OTHER)
Admission: EM | Admit: 2013-12-18 | Discharge: 2013-12-19 | Disposition: A | Discharge status: Home / Self Care | Payer: BC Managed Care – PPO | Attending: Emergency Medicine | Admitting: Emergency Medicine

## 2013-12-18 ENCOUNTER — Emergency Department (HOSPITAL_BASED_OUTPATIENT_CLINIC_OR_DEPARTMENT_OTHER): Payer: BC Managed Care – PPO

## 2013-12-18 VITALS — BP 114/62 | HR 63 | Temp 98.0°F | Wt 228.1 lb

## 2013-12-18 DIAGNOSIS — F419 Anxiety disorder, unspecified: Secondary | ICD-10-CM | POA: Insufficient documentation

## 2013-12-18 DIAGNOSIS — I4949 Other premature depolarization: Secondary | ICD-10-CM

## 2013-12-18 DIAGNOSIS — R002 Palpitations: Secondary | ICD-10-CM | POA: Insufficient documentation

## 2013-12-18 DIAGNOSIS — I493 Ventricular premature depolarization: Secondary | ICD-10-CM

## 2013-12-18 DIAGNOSIS — E785 Hyperlipidemia, unspecified: Secondary | ICD-10-CM | POA: Diagnosis not present

## 2013-12-18 DIAGNOSIS — Z87891 Personal history of nicotine dependence: Secondary | ICD-10-CM | POA: Diagnosis not present

## 2013-12-18 DIAGNOSIS — Z79899 Other long term (current) drug therapy: Secondary | ICD-10-CM | POA: Diagnosis not present

## 2013-12-18 DIAGNOSIS — F411 Generalized anxiety disorder: Secondary | ICD-10-CM

## 2013-12-18 LAB — CBC WITH DIFFERENTIAL/PLATELET
BASOS ABS: 0 10*3/uL (ref 0.0–0.1)
BASOS ABS: 0 10*3/uL (ref 0.0–0.1)
BASOS PCT: 0.2 % (ref 0.0–3.0)
BASOS PCT: 0 % (ref 0–1)
EOS ABS: 0.1 10*3/uL (ref 0.0–0.7)
EOS ABS: 0.2 10*3/uL (ref 0.0–0.7)
EOS PCT: 3 % (ref 0–5)
Eosinophils Relative: 2.3 % (ref 0.0–5.0)
HEMATOCRIT: 41.5 % (ref 39.0–52.0)
HEMATOCRIT: 39.6 % (ref 39.0–52.0)
HEMOGLOBIN: 13.9 g/dL (ref 13.0–17.0)
Hemoglobin: 14.1 g/dL (ref 13.0–17.0)
LYMPHS ABS: 1.9 10*3/uL (ref 0.7–4.0)
LYMPHS PCT: 30.7 % (ref 12.0–46.0)
Lymphocytes Relative: 33 % (ref 12–46)
Lymphs Abs: 2.3 10*3/uL (ref 0.7–4.0)
MCH: 29.6 pg (ref 26.0–34.0)
MCHC: 33.5 g/dL (ref 30.0–36.0)
MCHC: 35.6 g/dL (ref 30.0–36.0)
MCV: 84.8 fl (ref 78.0–100.0)
MCV: 83 fL (ref 78.0–100.0)
MONO ABS: 0.7 10*3/uL (ref 0.1–1.0)
MONOS PCT: 9.2 % (ref 3.0–12.0)
Monocytes Absolute: 0.6 10*3/uL (ref 0.1–1.0)
Monocytes Relative: 10 % (ref 3–12)
NEUTROS ABS: 3.5 10*3/uL (ref 1.4–7.7)
Neutro Abs: 3.7 10*3/uL (ref 1.7–7.7)
Neutrophils Relative %: 57.6 % (ref 43.0–77.0)
Neutrophils Relative %: 54 % (ref 43–77)
PLATELETS: 240 10*3/uL (ref 150–400)
Platelets: 218 10*3/uL (ref 150.0–400.0)
RBC: 4.89 Mil/uL (ref 4.22–5.81)
RBC: 4.77 MIL/uL (ref 4.22–5.81)
RDW: 14.2 % (ref 11.5–15.5)
RDW: 13.9 % (ref 11.5–15.5)
WBC: 6.1 10*3/uL (ref 4.0–10.5)
WBC: 6.9 10*3/uL (ref 4.0–10.5)

## 2013-12-18 LAB — BASIC METABOLIC PANEL
BUN: 29 mg/dL — AB (ref 6–23)
CHLORIDE: 106 meq/L (ref 96–112)
CO2: 23 mEq/L (ref 19–32)
CREATININE: 1.1 mg/dL (ref 0.4–1.5)
Calcium: 9.1 mg/dL (ref 8.4–10.5)
GFR: 71.45 mL/min (ref >60.00)
GLUCOSE: 90 mg/dL (ref 70–99)
POTASSIUM: 3.9 meq/L (ref 3.5–5.1)
Sodium: 138 mEq/L (ref 135–145)

## 2013-12-18 LAB — TSH: TSH: 0.47 u[IU]/mL (ref 0.35–4.50)

## 2013-12-18 IMAGING — CR DG CHEST 2V
2 series · 2 of 2 positions shown · non-contrast
Comparison: None.

CLINICAL DATA: Palpitations for 3 hr.

EXAM:
CHEST  2 VIEW

[w chest pa]
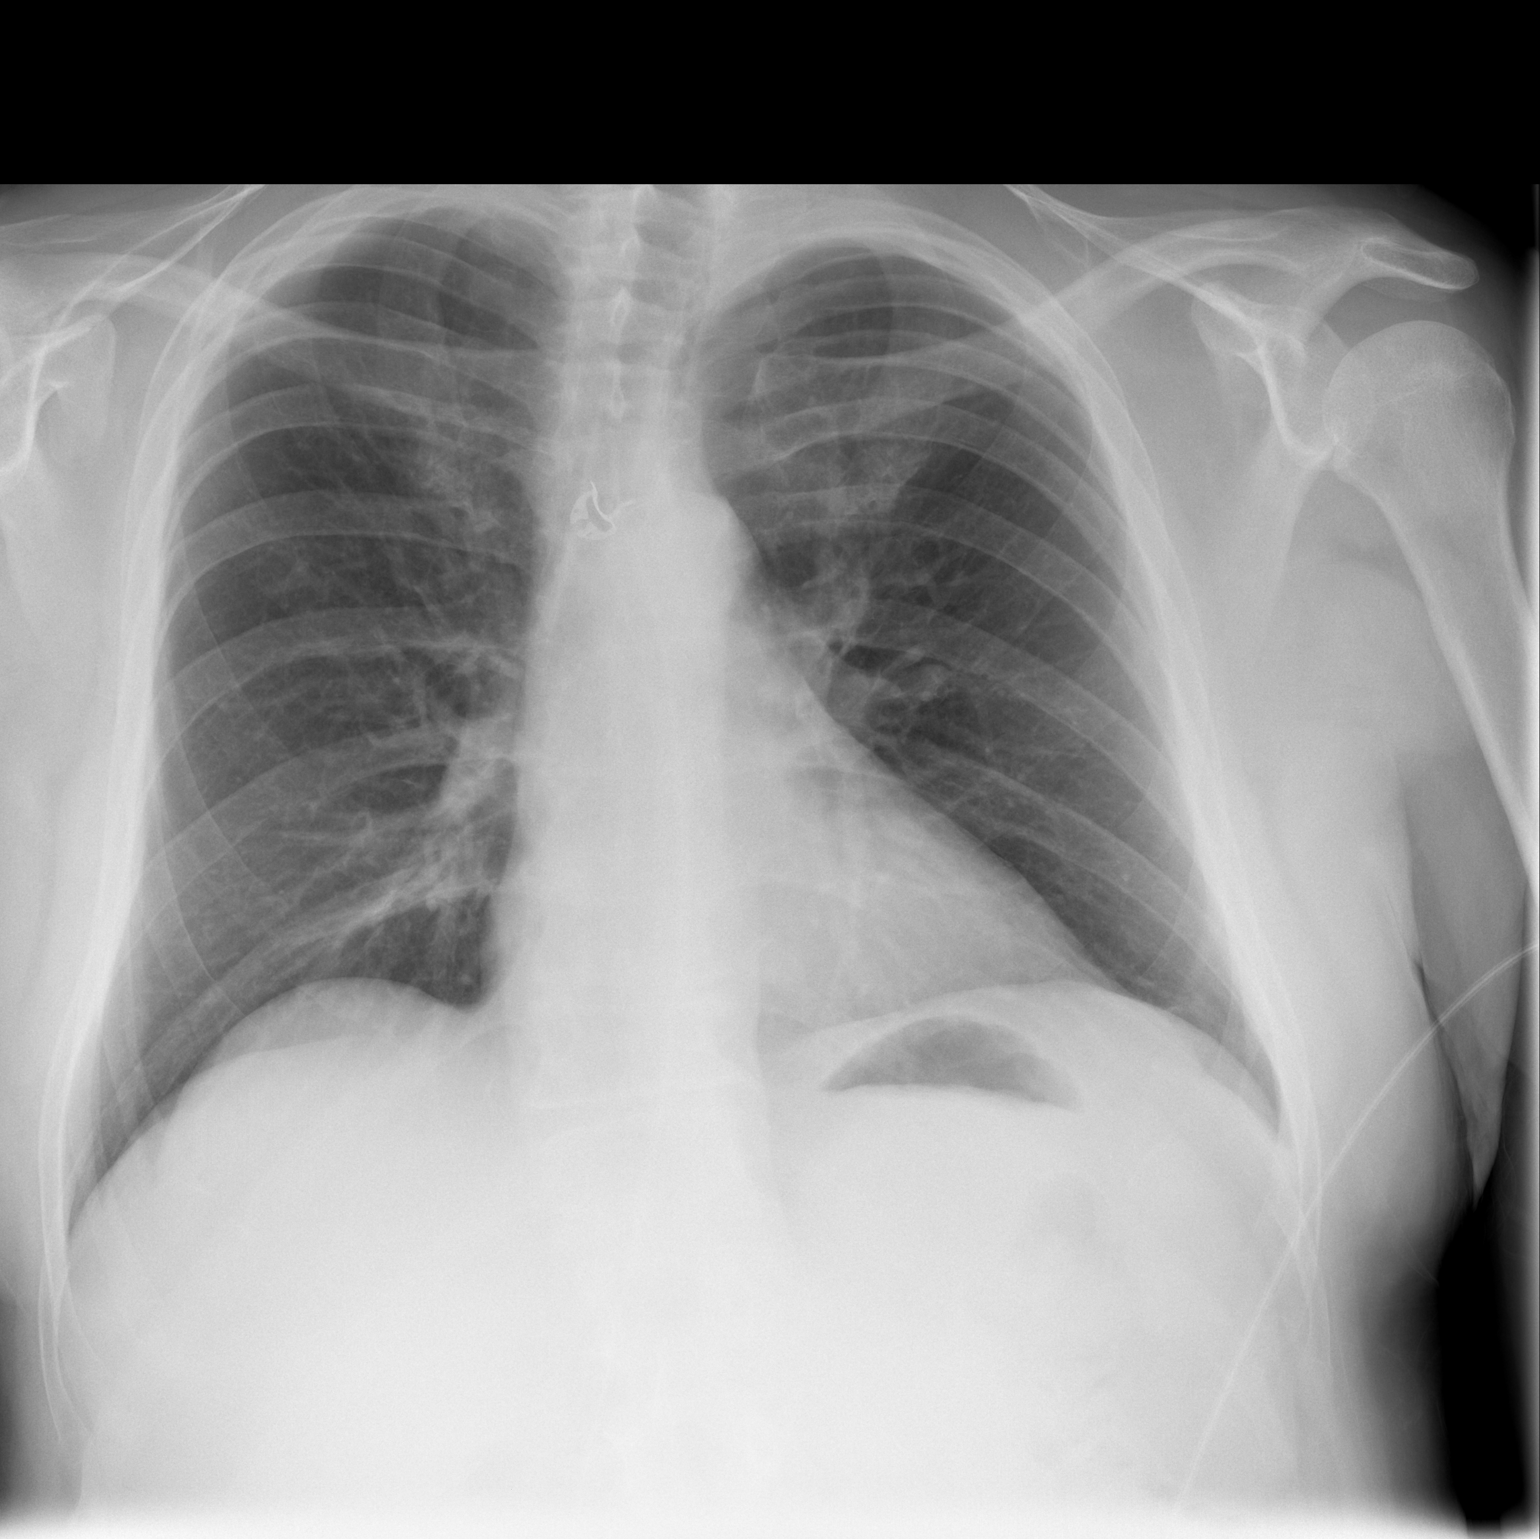

[w chest lat]
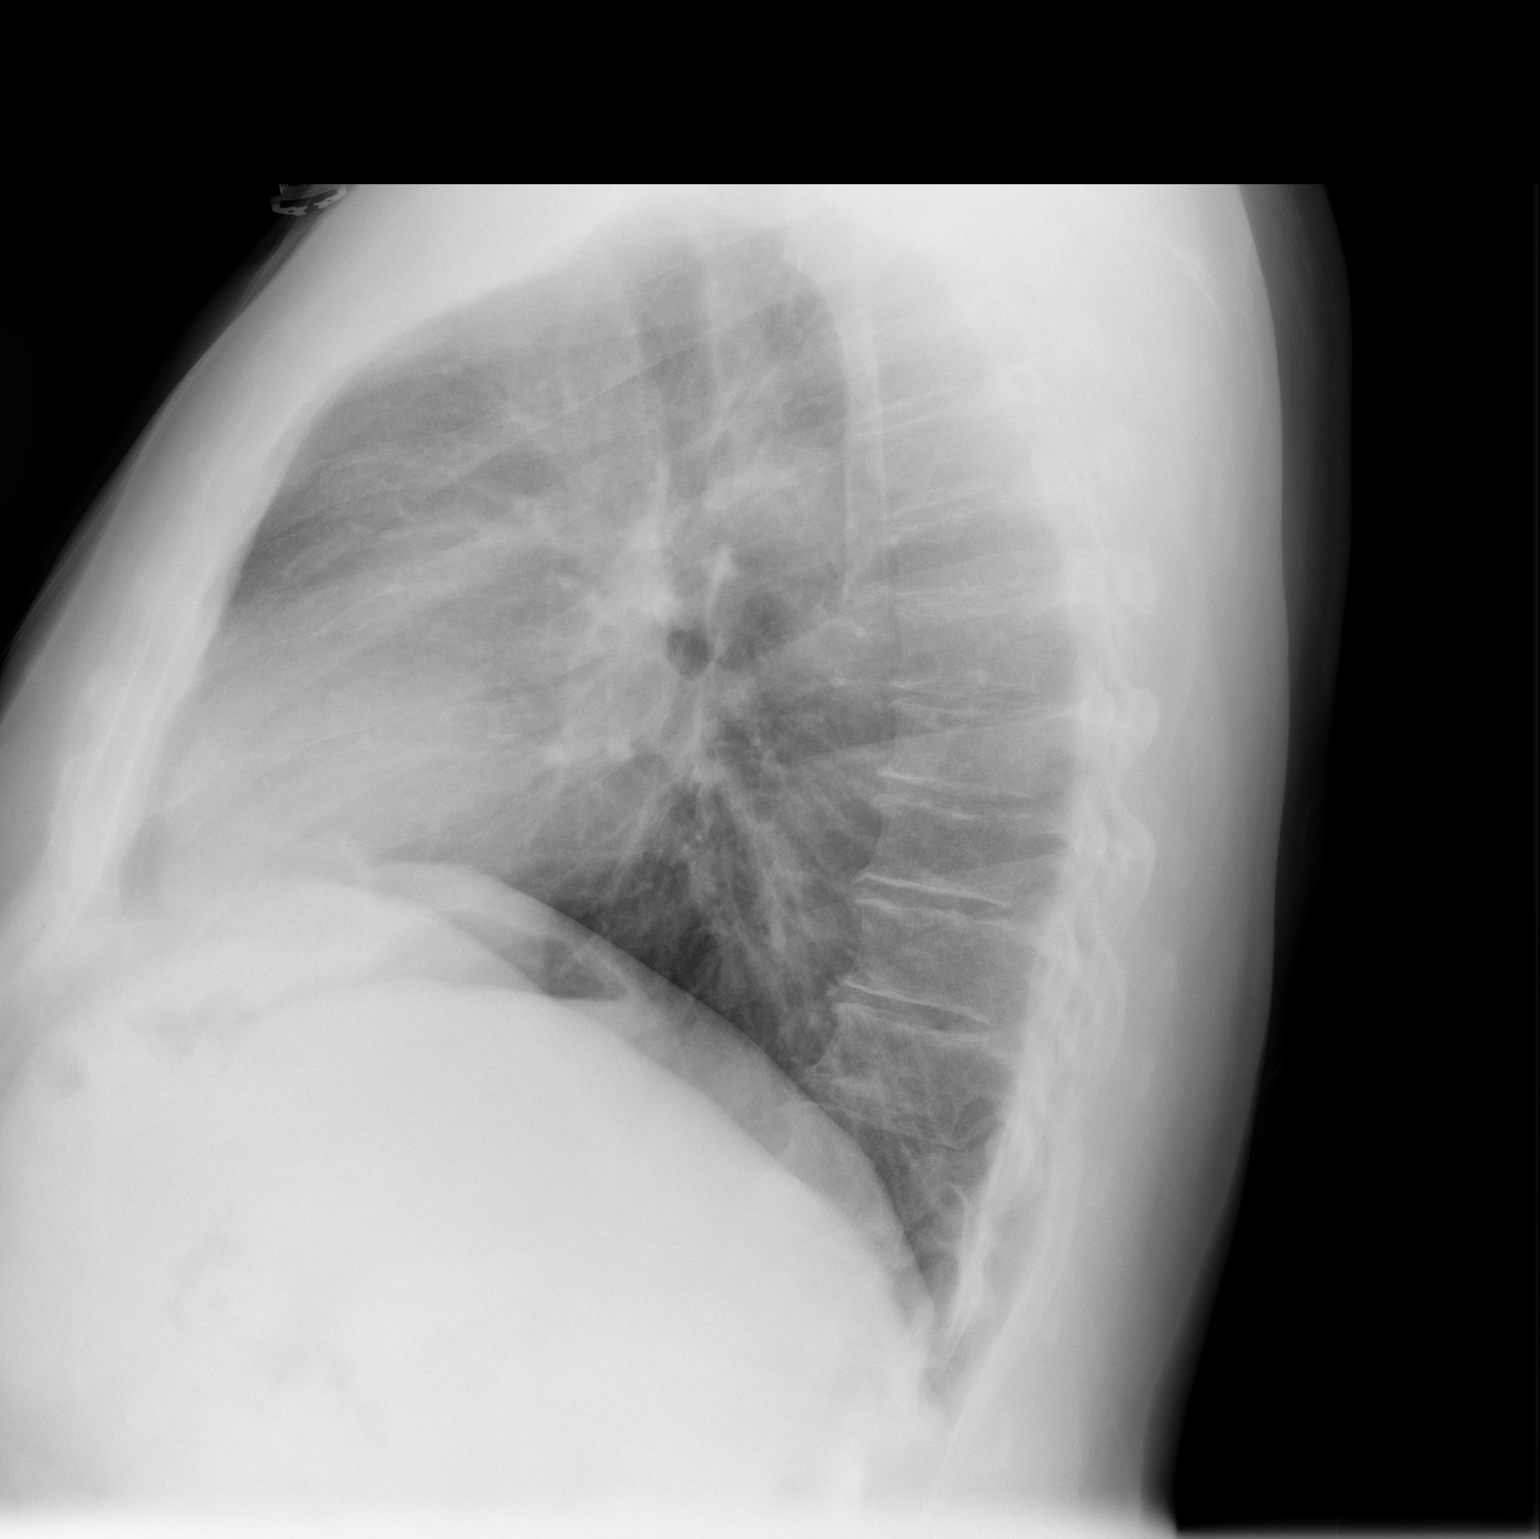

[2 of 2 positions shown; findings below may reference images not displayed]

FINDINGS: Normal heart size and pulmonary vascularity. No focal airspace
disease or consolidation in the lungs. No blunting of costophrenic
angles. No pneumothorax. Mediastinal contours appear intact. Mild
thoracic scoliosis convex towards the right. Degenerative changes in
the spine.
IMPRESSION: No active cardiopulmonary disease.

## 2013-12-18 NOTE — ED Notes (Addendum)
Palpitations x 3 hours. No sob. He was seen by Dr Larose Kells this am for same that he has had for a few days.

## 2013-12-18 NOTE — Assessment & Plan Note (Addendum)
Patient experiencing palpitations, history of PVCs, no red flag symptoms. EKG at baseline. Plan: TSH, CBC, BMP Treat anxiety, see below Holter  monitor Return to the office 6 weeks, call sooner if symptoms change or get worse

## 2013-12-18 NOTE — Progress Notes (Signed)
   Subjective:    Patient ID: Shane Wood, male    DOB: 10/22/1955, 58 y.o.   MRN: [id number]  DOS:  12/18/2013 Type of visit - description: acute History: Patient has a history of PVCs/palpitations. Under a lot of distress, a new job since December 2014, wife was involved in a serious motor vehicle accident in May 2015. Had symptoms back in December and again 3 times over the last week. Symptoms described as a feeling of his heart is skipping, does not feel like his heart is going fast. It may last several hours, no associated chest pain, difficulty breathing, headache, nausea, vomiting, diaphoresis or a fainting feeling.   ROS  Not taking much caffeine actually has cut down to 2 or 3 servings a day. He travels at least 40 minutes everyday to work, denies any leg pain or calf swelling.  Past Medical History  Diagnosis Date  . Hyperlipidemia   . H/O cardiovascular stress test 2005     (-)  . PVC (premature ventricular contraction)     Past Surgical History  Procedure Laterality Date  . No past surgeries      History   Social History  . Marital Status: Married    Spouse Name: N/A    Number of Children: 2  . Years of Education: N/A   Occupational History  . Programmer, multimedia for [employer] History Main Topics  . Smoking status: Former Smoker    Quit date: 09/24/1988  . Smokeless tobacco: Not on file  . Alcohol Use: 2.4 oz/week    4 Glasses of wine per week  . Drug Use: No  . Sexual Activity: Not on file   Other Topics Concern  . Not on file   Social History Narrative   Active when time allows, ~ 2 /week        Medication List       This list is accurate as of: 12/18/13 10:55 PM.  Always use your most recent med list.               ACAI BERRY PO  Take 2 tablets by mouth daily.     CHOLEST CARE PO  Take 1 tablet by mouth daily.     multivitamin tablet  Take 1 tablet by mouth daily.           Objective:   Physical Exam BP 114/62   Pulse 63  Temp(Src) 98 F (36.7 C) (Oral)  Wt 228 lb 2 oz (103.477 kg)  SpO2 98%  General -- alert, well-developed, NAD.  Neck --no thyromegaly  HEENT-- Not pale.  Lungs -- normal respiratory effort, no intercostal retractions, no accessory muscle use, and normal breath sounds.  Heart-- normal rate, regular rhythm, no murmur.  Abdomen-- Not distended, good bowel sounds,soft, non-tender. Extremities-- no pretibial edema bilaterally  Neurologic--  alert & oriented X3. Speech normal, gait appropriate for age, strength symmetric and appropriate for age.  Psych-- Cognition and judgment appear intact. Cooperative with normal attention span and concentration. No anxious or depressed appearing.         Assessment & Plan:

## 2013-12-18 NOTE — Patient Instructions (Signed)
Get your blood work before you leave   Next visit is for a physical exam in 6 weeks, fasting Please make an appointment

## 2013-12-18 NOTE — Assessment & Plan Note (Signed)
Anxious lately, see history of present illness. Treatment modalities discuss: Exercise, meditation, counseling (psychologist, family member), prn  medications and SSRIs. Patient would like to start with simple things like meditation, will call if needed rec to RTC for a CPX in 6 weeks, reasses then

## 2013-12-18 NOTE — Progress Notes (Signed)
Pre-visit discussion using our clinic review tool. No additional management support is needed unless otherwise documented below in the visit note.  

## 2013-12-19 LAB — COMPREHENSIVE METABOLIC PANEL
ALBUMIN: 3.8 g/dL (ref 3.5–5.2)
ALT: 29 U/L (ref 0–53)
AST: 22 U/L (ref 0–37)
Alkaline Phosphatase: 53 U/L (ref 39–117)
Anion gap: 15 (ref 5–15)
BUN: 25 mg/dL — ABNORMAL HIGH (ref 6–23)
CALCIUM: 9.3 mg/dL (ref 8.4–10.5)
CO2: 24 meq/L (ref 19–32)
CREATININE: 1.1 mg/dL (ref 0.50–1.35)
Chloride: 102 mEq/L (ref 96–112)
GFR calc Af Amer: 84 mL/min — ABNORMAL LOW (ref >90)
GFR, EST NON AFRICAN AMERICAN: 72 mL/min — AB (ref >90)
Glucose, Bld: 149 mg/dL — ABNORMAL HIGH (ref 70–99)
Potassium: 3.9 mEq/L (ref 3.7–5.3)
SODIUM: 141 meq/L (ref 137–147)
TOTAL PROTEIN: 7.2 g/dL (ref 6.0–8.3)
Total Bilirubin: <0.2 mg/dL — ABNORMAL LOW (ref 0.3–1.2)

## 2013-12-19 LAB — TROPONIN I

## 2013-12-19 NOTE — ED Provider Notes (Signed)
CSN: [id number]     Arrival date & time 12/18/13  2252 History   First MD Initiated Contact with Patient 12/18/13 2300   Chief Complaint  Patient presents with  . 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 He has had increased frequency of PVCs over the past several days, worse this evening. They're exacerbated by stress. There is no associated chest pain, shortness of breath, diaphoresis or nausea. When severe they occur every few seconds. He was seen by his PCP this morning but no PVCs were noted at that time. He discussed with his PCP the possibility of being placed on Holter monitor. The patient has cut down on his caffeine use but still drinks about 3 cups of coffee a day.  Past Medical History  Diagnosis Date  . Hyperlipidemia   . H/O cardiovascular stress test 2005     (-)  . PVC (premature ventricular contraction)    Past Surgical History  Procedure Laterality Date  . No past surgeries     Family History  Problem Relation Age of Onset  . Lung cancer Father   . Diabetes Maternal Grandmother   . Diabetes Maternal Grandfather    History  Substance Use Topics  . Smoking status: Former Smoker    Quit date: 09/24/1988  . Smokeless tobacco: Not on file  . Alcohol Use: 2.4 oz/week    4 Glasses of wine per week    Review of Systems  All other systems reviewed and are negative.   Allergies  Review of patient's allergies indicates no known allergies.  Home Medications   Prior to Admission medications   Medication Sig Start Date End Date Taking? Authorizing Provider  ACAI BERRY PO Take 2 tablets by mouth daily.    Historical Provider, MD  Multiple Vitamin (MULTIVITAMIN) tablet Take 1 tablet by mouth daily.      Historical Provider, MD  Phytosterol Esters (CHOLEST CARE PO) Take 1 tablet by mouth daily.    Historical Provider, MD   BP  151/83  Pulse 98  Temp(Src) 97.9 F (36.6 C) (Oral)  SpO2 99%  Physical Exam General: Well-developed, well-nourished male in no acute distress; appearance consistent with age of record HENT: normocephalic; atraumatic Eyes: pupils equal, round and reactive to light; extraocular muscles intact Neck: supple Heart: regular rate and rhythm; no murmur; frequent PVCs Lungs: clear to auscultation bilaterally Abdomen: soft; nondistended; nontender; no masses or hepatosplenomegaly; bowel sounds present Extremities: No deformity; full range of motion; pulses normal; no edema Neurologic: Awake, alert and oriented; motor function intact in all extremities and symmetric; no facial droop Skin: Warm and dry Psychiatric: Normal mood and affect    ED Course  Procedures (including critical care time)   EKG Interpretation   Date/Time:  Friday December 18 2013 23:10:43 EDT Ventricular Rate:  93 PR Interval:  146 QRS Duration: 86 QT Interval:  360 QTC Calculation: 447 R Axis:   3 Text Interpretation:  Sinus rhythm with occasional Premature ventricular  complexes Possible Left atrial enlargement Nonspecific ST abnormality  Abnormal ECG No previous ECGs available Confirmed by Elisandra Deshmukh  MD, Jenny Reichmann  [phone]) on 12/18/2013 11:29:45 PM      MDM   Nursing notes and vitals signs, including pulse oximetry, reviewed.  Summary of this visit's results, reviewed by myself:  Labs:  Results for orders placed during the hospital encounter of 12/18/13 (from the past 24 hour(s))  CBC WITH  DIFFERENTIAL     Status: None   Collection Time    12/18/13 11:29 PM      Result Value Ref Range   WBC 6.9  4.0 - 10.5 K/uL   RBC 4.77  4.22 - 5.81 MIL/uL   Hemoglobin 14.1  13.0 - 17.0 g/dL   HCT 39.6  39.0 - 52.0 %   MCV 83.0  78.0 - 100.0 fL   MCH 29.6  26.0 - 34.0 pg   MCHC 35.6  30.0 - 36.0 g/dL   RDW 13.9  11.5 - 15.5 %   Platelets 240  150 - 400 K/uL   Neutrophils Relative % 54  43 - 77 %   Neutro Abs 3.7  1.7  - 7.7 K/uL   Lymphocytes Relative 33  12 - 46 %   Lymphs Abs 2.3  0.7 - 4.0 K/uL   Monocytes Relative 10  3 - 12 %   Monocytes Absolute 0.7  0.1 - 1.0 K/uL   Eosinophils Relative 3  0 - 5 %   Eosinophils Absolute 0.2  0.0 - 0.7 K/uL   Basophils Relative 0  0 - 1 %   Basophils Absolute 0.0  0.0 - 0.1 K/uL  COMPREHENSIVE METABOLIC PANEL     Status: Abnormal   Collection Time    12/18/13 11:29 PM      Result Value Ref Range   Sodium 141  137 - 147 mEq/L   Potassium 3.9  3.7 - 5.3 mEq/L   Chloride 102  96 - 112 mEq/L   CO2 24  19 - 32 mEq/L   Glucose, Bld 149 (*) 70 - 99 mg/dL   BUN 25 (*) 6 - 23 mg/dL   Creatinine, Ser 1.10  0.50 - 1.35 mg/dL   Calcium 9.3  8.4 - 10.5 mg/dL   Total Protein 7.2  6.0 - 8.3 g/dL   Albumin 3.8  3.5 - 5.2 g/dL   AST 22  0 - 37 U/L   ALT 29  0 - 53 U/L   Alkaline Phosphatase 53  39 - 117 U/L   Total Bilirubin <0.2 (*) 0.3 - 1.2 mg/dL   GFR calc non Af Amer 72 (*) >90 mL/min   GFR calc Af Amer 84 (*) >90 mL/min   Anion gap 15  5 - 15  TROPONIN I     Status: None   Collection Time    12/18/13 11:29 PM      Result Value Ref Range   Troponin I <0.30  <0.30 ng/mL    Imaging Studies: Dg Chest 2 View  12/19/2013   CLINICAL DATA:  Palpitations for 3 hr.  EXAM: CHEST  2 VIEW  COMPARISON:  None.  FINDINGS: Normal heart size and pulmonary vascularity. No focal airspace disease or consolidation in the lungs. No blunting of costophrenic angles. No pneumothorax. Mediastinal contours appear intact. Mild thoracic scoliosis convex towards the right. Degenerative changes in the spine.  IMPRESSION: No active cardiopulmonary disease.   Electronically Signed   By: Lucienne Capers M.D.   On: 12/19/2013 00:07   12:48 AM The patient's rhythm strip in the ED was reviewed. He is having frequent PVCs with rare couplets but no triplets or longer runs. We will refer him to Cardiology for further evaluation and treatment.    Wynetta Fines, MD 12/19/13 [phone]

## 2013-12-21 ENCOUNTER — Encounter: Payer: Self-pay | Admitting: Internal Medicine

## 2013-12-21 ENCOUNTER — Encounter: Payer: Self-pay | Admitting: Cardiovascular Disease

## 2013-12-21 ENCOUNTER — Encounter: Payer: Self-pay | Admitting: *Deleted

## 2013-12-22 ENCOUNTER — Encounter: Payer: Self-pay | Admitting: Cardiovascular Disease

## 2013-12-23 ENCOUNTER — Encounter: Payer: Self-pay | Admitting: Cardiovascular Disease

## 2013-12-23 ENCOUNTER — Ambulatory Visit (INDEPENDENT_AMBULATORY_CARE_PROVIDER_SITE_OTHER): Payer: BC Managed Care – PPO | Admitting: Cardiovascular Disease

## 2013-12-23 VITALS — BP 130/70 | HR 82 | Wt 226.0 lb

## 2013-12-23 DIAGNOSIS — I4949 Other premature depolarization: Secondary | ICD-10-CM

## 2013-12-23 DIAGNOSIS — R002 Palpitations: Secondary | ICD-10-CM

## 2013-12-23 DIAGNOSIS — I493 Ventricular premature depolarization: Secondary | ICD-10-CM

## 2013-12-23 MED ORDER — ATENOLOL 25 MG PO TABS: 25.0000 mg | ORAL_TABLET | Freq: Every day | ORAL | Status: DC

## 2013-12-23 NOTE — Assessment & Plan Note (Signed)
Chronic and benign sounding  Related to stress STart Atenolol 25 mg until social issues improved.  ETT and echo to r/o structural heart disease

## 2013-12-23 NOTE — Assessment & Plan Note (Signed)
Cholesterol is at goal.  Continue current dose of statin and diet Rx.  No myalgias or side effects.  F/U  LFT's in 6 months. Lab Results  Component Value Date   LDLCALC 109* 07/21/2010   Labs with primary in [place]

## 2013-12-23 NOTE — Progress Notes (Signed)
Patient ID: Shane Wood, male   DOB: Jan 25, 1956, 58 y.o.   MRN: [id number]   58 yo self referred from ED in [place] for palpitations and PVCS  Has over 15 year history of such  . He has had increased frequency of PVCs over the past several days, worse this 8/29 . They're exacerbated by stress. There is no associated chest pain, shortness of breath, diaphoresis or nausea. When severe they occur every few seconds. He was seen by his PCP  but no PVCs were noted at that time. He discussed with his PCP the possibility of being placed on Holter monitor. The patient has cut down on his caffeine use but still drinks about 3 cups of coffee a day.  In ER CXR with NAD, R/O and labs ok No TSH seen  Clearly has had more stress.  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  Wife in bad car accident May  Denies excess ETOH, stimulants or other drugs         ROS: Denies fever, malais, weight loss, blurry vision, decreased visual acuity, cough, sputum, SOB, hemoptysis, pleuritic pain, palpitaitons, heartburn, abdominal pain, melena, lower extremity edema, claudication, or rash.  All other systems reviewed and negative   General: Affect appropriate Healthy:  appears stated age 58: normal Neck supple with no adenopathy JVP normal no bruits no thyromegaly Lungs clear with no wheezing and good diaphragmatic motion Heart:  S1/S2 no murmur,rub, gallop or click PMI normal Abdomen: benighn, BS positve, no tenderness, no AAA no bruit.  No HSM or HJR Distal pulses intact with no bruits No edema Neuro non-focal Skin warm and dry No muscular weakness  Medications Current Outpatient Prescriptions  Medication Sig Dispense Refill  . ACAI BERRY PO Take 2 tablets by mouth daily.      . Multiple Vitamin (MULTIVITAMIN) tablet Take 1 tablet by mouth daily.        [name] Phytosterol Esters (CHOLEST CARE PO) Take 1 tablet by mouth daily.       No current facility-administered  medications for this visit.    Allergies Review of patient's allergies indicates no known allergies.  Family History: Family History  Problem Relation Age of Onset  . Lung cancer Father   . Diabetes Maternal Grandmother   . Diabetes Maternal Grandfather     Social History: History   Social History  . Marital Status: Married    Spouse Name: N/A    Number of Children: 2  . Years of Education: N/A   Occupational History  . Programmer, multimedia for [employer] History Main Topics  . Smoking status: Former Smoker    Quit date: 09/24/1988  . Smokeless tobacco: Not on file  . Alcohol Use: 2.4 oz/week    4 Glasses of wine per week  . Drug Use: No  . Sexual Activity: Not on file   Other Topics Concern  . Not on file   Social History Narrative   Active when time allows, ~ 2 /week    Electrocardiogram:  SR rate 93 PVC LAE nonspecific ST changes  Assessment and Plan

## 2013-12-23 NOTE — Patient Instructions (Signed)
Your physician recommends that you schedule a follow-up appointment in: NEXT  AVAILABLE  WITH DR [facility]  Your physician has recommended you make the following change in your medication:  START  ATENOLOL  25 MG  [place] has requested that you have an echocardiogram. Echocardiography is a painless test that uses sound waves to create images of your heart. It provides your doctor with information about the size and shape of your heart and how well your heart's chambers and valves are working. This procedure takes approximately one hour. There are no restrictions for this procedure.  Your physician has requested that you have an exercise tolerance test. For further information please visit [email or web address]. Please also follow instruction sheet, as given.

## 2014-01-28 ENCOUNTER — Ambulatory Visit (INDEPENDENT_AMBULATORY_CARE_PROVIDER_SITE_OTHER): Payer: BC Managed Care – PPO

## 2014-01-28 ENCOUNTER — Ambulatory Visit (HOSPITAL_COMMUNITY): Payer: BC Managed Care – PPO | Attending: Cardiology

## 2014-01-28 DIAGNOSIS — E785 Hyperlipidemia, unspecified: Secondary | ICD-10-CM | POA: Diagnosis not present

## 2014-01-28 DIAGNOSIS — Z87891 Personal history of nicotine dependence: Secondary | ICD-10-CM | POA: Diagnosis not present

## 2014-01-28 DIAGNOSIS — F419 Anxiety disorder, unspecified: Secondary | ICD-10-CM | POA: Insufficient documentation

## 2014-01-28 DIAGNOSIS — I493 Ventricular premature depolarization: Secondary | ICD-10-CM

## 2014-01-28 DIAGNOSIS — R002 Palpitations: Secondary | ICD-10-CM

## 2014-01-28 DIAGNOSIS — M549 Dorsalgia, unspecified: Secondary | ICD-10-CM | POA: Insufficient documentation

## 2014-01-28 DIAGNOSIS — G4700 Insomnia, unspecified: Secondary | ICD-10-CM | POA: Insufficient documentation

## 2014-01-28 NOTE — Progress Notes (Signed)
2D Echo completed. 01/28/2014  

## 2014-01-28 NOTE — Progress Notes (Signed)
Exercise Treadmill Test  Pre-Exercise Testing Evaluation Rhythm: normal sinus  Rate: 68 bpm     Test  Exercise Tolerance Test Ordering MD: Jenkins Rouge, MD  Interpreting MD: Angelena Form, PA-C  Unique Test No: 1  Treadmill:  1  Indication for ETT: Palps  Contraindication to ETT: No   Stress Modality: exercise - treadmill  Cardiac Imaging Performed: non   Protocol: standard Bruce - maximal  Max BP:  216/77  Max MPHR (bpm):  162 85% MPR (bpm):  138  MPHR obtained (bpm):  144 % MPHR obtained: 88%  Reached 85% MPHR (min:sec):  7:34 Total Exercise Time (min-sec):  9 min  Workload in METS:  10.1 Borg Scale: 16  Reason ETT Terminated:  patient's desire to stop; reached target HR    ST Segment Analysis At Rest: normal ST segments - no evidence of significant ST depression With Exercise: borderline ST changes  Upsloping ST changes in I and II   Other Information Arrhythmia:  Yes Angina during ETT:  absent (0) Quality of ETT:  diagnostic  ETT Interpretation:  Diagnostic. Normal with some non-specific upsloping ST Depression in lead I and II. Freq ventricular ectopy from RV outflow tract ( reviewed by Dr. Caryl Comes).   Comments: Patient was hypertensive prior to starting the test. 176/108. This was repeated and it went down to 165/101. We still proceeded with the ETT with caution. His BP continued to rise with exercise but within normal ranges. Patient started have ventricular ectopy around 5 minutes around HR 115. Progressively had more PVCs, couplets, triplets as well as bigeminy and trigeminy. Patient symptomatic.  Some upsloping ST depression in Lead I and II. Reviewed by Dr. Caryl Comes and thought to be benign.   Recommendations: Follow up with Dr. Johnsie Cancel. Continue current medical therapy.

## 2014-02-02 ENCOUNTER — Telehealth: Payer: Self-pay | Admitting: Cardiovascular Disease

## 2014-02-02 ENCOUNTER — Telehealth: Payer: Self-pay | Admitting: *Deleted

## 2014-02-02 DIAGNOSIS — I10 Essential (primary) hypertension: Secondary | ICD-10-CM

## 2014-02-02 NOTE — Telephone Encounter (Signed)
Message copied by Richmond Campbell on Tue Feb 02, 2014  1:47 PM ------      Message from: Josue Hector      Created: Fri Jan 29, 2014  8:36 AM       Needs to come back for doppler part of echo especially aortic valve            ----- Message -----         From: [facility]         Sent: 01/28/2014   4:34 PM           To: Josue Hector, MD                   ------

## 2014-02-02 NOTE — Telephone Encounter (Signed)
PT  AWARE  NEED TO  COME  BACK  FOR  COMPLETE  ECHO  PREVIOUS  STUDY  NOT  DONE   NEED DOPPLER   TO AORTIC  VALVE .Adonis Housekeeper

## 2014-02-02 NOTE — Telephone Encounter (Signed)
New problem ° ° °Pt returning your call. °

## 2014-02-02 NOTE — Telephone Encounter (Signed)
PT  AWARE./CY 

## 2014-02-04 ENCOUNTER — Ambulatory Visit (HOSPITAL_COMMUNITY): Payer: BC Managed Care – PPO | Attending: Cardiology | Admitting: Radiology

## 2014-02-04 DIAGNOSIS — I10 Essential (primary) hypertension: Secondary | ICD-10-CM

## 2014-02-04 DIAGNOSIS — R0989 Other specified symptoms and signs involving the circulatory and respiratory systems: Secondary | ICD-10-CM

## 2014-02-04 NOTE — Progress Notes (Signed)
Limited Echocardiogram performed to re-evaluate the aortic valve with no charge to patient per cardiologist.

## 2014-02-05 ENCOUNTER — Other Ambulatory Visit: Payer: Self-pay

## 2014-02-08 ENCOUNTER — Ambulatory Visit (INDEPENDENT_AMBULATORY_CARE_PROVIDER_SITE_OTHER): Payer: BC Managed Care – PPO | Admitting: Cardiovascular Disease

## 2014-02-08 ENCOUNTER — Encounter: Payer: Self-pay | Admitting: Cardiovascular Disease

## 2014-02-08 VITALS — BP 162/102 | HR 77 | Ht 71.0 in | Wt 228.4 lb

## 2014-02-08 DIAGNOSIS — R002 Palpitations: Secondary | ICD-10-CM

## 2014-02-08 DIAGNOSIS — Z79899 Other long term (current) drug therapy: Secondary | ICD-10-CM

## 2014-02-08 DIAGNOSIS — R030 Elevated blood-pressure reading, without diagnosis of hypertension: Secondary | ICD-10-CM

## 2014-02-08 MED ORDER — ATENOLOL 50 MG PO TABS: 50.0000 mg | ORAL_TABLET | Freq: Every day | ORAL | Status: DC

## 2014-02-08 MED ORDER — LOSARTAN POTASSIUM 25 MG PO TABS: 25.0000 mg | ORAL_TABLET | Freq: Every day | ORAL | Status: DC

## 2014-02-08 NOTE — Patient Instructions (Signed)
Your physician recommends that you schedule a follow-up appointment in:  [name] physician has recommended you make the following change in your medication: START  LOSARTAN   25 [place] METOPROLOL TO  50 MG  EVERY DAY   Your physician recommends that you return for lab work in:  BMET  IN [place]

## 2014-02-08 NOTE — Assessment & Plan Note (Signed)
Cholesterol is at goal.  Continue current dose of statin and diet Rx.  No myalgias or side effects.  F/U  LFT's in 6 months. Lab Results  Component Value Date   LDLCALC 109* 07/21/2010

## 2014-02-08 NOTE — Assessment & Plan Note (Signed)
HTN response to exercise as well increase atenolol to 50 mg in am and add cozaar 25 in afternoon  BMET in 3 weeks and f/u with me next available  Encouraged him to get BP cuff and make home readings

## 2014-02-08 NOTE — Progress Notes (Signed)
Patient ID: Shane Wood, male   DOB: 05/12/1955, 58 y.o.   MRN: [id number] 58 yo self referred from ED in [place] for palpitations and PVCS Has over 15 year history of such . He has had increased frequency of PVCs over the past several days, worse this 8/29 . They're exacerbated by stress. There is no associated chest pain, shortness of breath, diaphoresis or nausea. When severe they occur every few seconds. He was seen by his PCP but no PVCs were noted at that time. He discussed with his PCP the possibility of being placed on Holter monitor. The patient has cut down on his caffeine use but still drinks about 3 cups of coffee a day. In ER CXR with NAD, R/O and labs ok No TSH seen Clearly has had more stress. 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 Wife in bad car accident May Denies excess ETOH, stimulants or other drugs   Started on atenolol 25 mg in September F/U ETT non ishcemic HTN response and PVC;s  Echo reviewed 10/15  Normal EF AV sclerosis  BP still high not taking atenolol regularly  Salt intake ok  Sedentary denies excess ETOH    ROS: Denies fever, malais, weight loss, blurry vision, decreased visual acuity, cough, sputum, SOB, hemoptysis, pleuritic pain, palpitaitons, heartburn, abdominal pain, melena, lower extremity edema, claudication, or rash.  All other systems reviewed and negative  General: Affect appropriate Healthy:  appears stated age 2: normal Neck supple with no adenopathy JVP normal no bruits no thyromegaly Lungs clear with no wheezing and good diaphragmatic motion Heart:  S1/S2 no murmur, no rub, gallop or click PMI normal Abdomen: benighn, BS positve, no tenderness, no AAA no bruit.  No HSM or HJR Distal pulses intact with no bruits No edema Neuro non-focal Skin warm and dry No muscular weakness   Current Outpatient Prescriptions  Medication Sig Dispense Refill  . ACAI BERRY PO Take 2 tablets by mouth daily.       [name] atenolol (TENORMIN) 25 MG tablet Take 1 tablet (25 mg total) by mouth daily.  90 tablet  3  . Multiple Vitamin (MULTIVITAMIN) tablet Take 1 tablet by mouth daily.        [name] Phytosterol Esters (CHOLEST CARE PO) Take 1 tablet by mouth daily.       No current facility-administered medications for this visit.    Allergies  Review of patient's allergies indicates no known allergies.  Electrocardiogram:  SR rate 93  Nonspecific ST changes PVC  LAE   Assessment and Plan

## 2014-03-12 ENCOUNTER — Other Ambulatory Visit: Payer: BC Managed Care – PPO

## 2014-03-15 ENCOUNTER — Encounter: Payer: BC Managed Care – PPO | Admitting: Internal Medicine

## 2014-03-16 ENCOUNTER — Other Ambulatory Visit: Payer: BC Managed Care – PPO

## 2014-03-23 ENCOUNTER — Other Ambulatory Visit: Payer: Self-pay

## 2014-04-01 ENCOUNTER — Other Ambulatory Visit (INDEPENDENT_AMBULATORY_CARE_PROVIDER_SITE_OTHER): Payer: BC Managed Care – PPO | Admitting: *Deleted

## 2014-04-01 DIAGNOSIS — Z79899 Other long term (current) drug therapy: Secondary | ICD-10-CM

## 2014-04-01 LAB — BASIC METABOLIC PANEL
BUN: 23 mg/dL (ref 6–23)
CALCIUM: 9.2 mg/dL (ref 8.4–10.5)
CO2: 24 meq/L (ref 19–32)
Chloride: 105 mEq/L (ref 96–112)
Creatinine, Ser: 1.1 mg/dL (ref 0.4–1.5)
GFR: 71.38 mL/min (ref >60.00)
Glucose, Bld: 97 mg/dL (ref 70–99)
POTASSIUM: 4.5 meq/L (ref 3.5–5.1)
SODIUM: 135 meq/L (ref 135–145)

## 2014-04-05 NOTE — Progress Notes (Signed)
Patient ID: Shane Wood, male   DOB: 1955-05-05, 58 y.o.   MRN: [id number] 58 yo self referred from ED in [place] for palpitations and PVCS Has over 15 year history of such . He has had increased frequency of PVCs over the past several days, worse this 8/29 . They're exacerbated by stress. There is no associated chest pain, shortness of breath, diaphoresis or nausea. When severe they occur every few seconds. He was seen by his PCP but no PVCs were noted at that time. He discussed with his PCP the possibility of being placed on Holter monitor. The patient has cut down on his caffeine use but still drinks about 3 cups of coffee a day. In ER CXR with NAD, R/O and labs ok No TSH seen Clearly has had more stress. 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 Wife in bad car accident May Denies excess ETOH, stimulants or other drugs   Started on atenolol 9/15 Echo 10/15 AV sclerosis normal EF Reviewed Study Conclusions  - Left ventricle: False tendon in LV apex of no clinical significance. The cavity size was normal. Systolic function was normal. The estimated ejection fraction was in the range of 55% to 60%. Wall motion was normal; there were no regional wall motion abnormalities. - Aortic valve: No doppler done of the AV to assess peak and mean gradient and AV velocity. Need limited study to assess. Moderate diffuse thickening and calcification, consistent with sclerosis. Valve area (VTI): 2.75 cm^2. Valve area (Vmax): 2.35 cm^2. Valve area (Vmean): 2.73 cm^2. - Aorta: Aortic root dimension: 39 mm (ED). - Ascending aorta: The ascending aorta was mildly dilated. - Left atrium: The atrium was mildly dilated. - Atrial septum: There was increased thickness of the septum, consistent with lipomatous hypertrophy.  ETT normal no Ischemia HTN and PVC;s  BMET 12/15 normal  Working at [employer] in [place] lots of stress    ROS: Denies fever, malais,  weight loss, blurry vision, decreased visual acuity, cough, sputum, SOB, hemoptysis, pleuritic pain, palpitaitons, heartburn, abdominal pain, melena, lower extremity edema, claudication, or rash.  All other systems reviewed and negative  General: Affect appropriate Healthy:  appears stated age 10: normal Neck supple with no adenopathy JVP normal no bruits no thyromegaly Lungs clear with no wheezing and good diaphragmatic motion Heart:  S1/S2 no murmur, no rub, gallop or click PMI normal Abdomen: benighn, BS positve, no tenderness, no AAA no bruit.  No HSM or HJR Distal pulses intact with no bruits No edema Neuro non-focal Skin warm and dry No muscular weakness   Current Outpatient Prescriptions  Medication Sig Dispense Refill  . atenolol (TENORMIN) 50 MG tablet Take 1 tablet (50 mg total) by mouth daily. 90 tablet 3  . losartan (COZAAR) 25 MG tablet Take 1 tablet (25 mg total) by mouth daily. 90 tablet 3  . Multiple Vitamin (MULTIVITAMIN) tablet Take 1 tablet by mouth daily.      [name] Phytosterol Esters (CHOLEST CARE PO) Take 1 tablet by mouth daily.     No current facility-administered medications for this visit.    Allergies  Review of patient's allergies indicates no known allergies.  Electrocardiogram: 8/15  SR PVC LAE nonspecific ST/T wave changes   Assessment and Plan

## 2014-04-06 ENCOUNTER — Ambulatory Visit (INDEPENDENT_AMBULATORY_CARE_PROVIDER_SITE_OTHER): Payer: BC Managed Care – PPO | Admitting: Cardiovascular Disease

## 2014-04-06 ENCOUNTER — Encounter: Payer: Self-pay | Admitting: Cardiovascular Disease

## 2014-04-06 VITALS — BP 116/70 | HR 60 | Ht 71.0 in | Wt 224.8 lb

## 2014-04-06 DIAGNOSIS — F419 Anxiety disorder, unspecified: Secondary | ICD-10-CM

## 2014-04-06 DIAGNOSIS — R030 Elevated blood-pressure reading, without diagnosis of hypertension: Secondary | ICD-10-CM

## 2014-04-06 DIAGNOSIS — E785 Hyperlipidemia, unspecified: Secondary | ICD-10-CM

## 2014-04-06 NOTE — Assessment & Plan Note (Signed)
Related to job stress High adrenergic tone BP and palpitations improved with beta blocker will continue

## 2014-04-06 NOTE — Assessment & Plan Note (Signed)
Well controlled.  Continue current medications and low sodium Dash type diet.    

## 2014-04-06 NOTE — Assessment & Plan Note (Signed)
Cholesterol is at goal.  Continue current dose of statin and diet Rx.  No myalgias or side effects.  F/U  LFT's in 6 months. Lab Results  Component Value Date   LDLCALC 109* 07/21/2010  Labs with Dr Larose Kells in 2/16

## 2014-04-06 NOTE — Patient Instructions (Signed)
Your physician wants you to follow-up in: YEAR WITH DR NISHAN  You will receive a reminder letter in the mail two months in advance. If you don't receive a letter, please call our office to schedule the follow-up appointment.  Your physician recommends that you continue on your current medications as directed. Please refer to the Current Medication list given to you today. 

## 2014-05-27 ENCOUNTER — Encounter: Payer: Self-pay | Admitting: Internal Medicine

## 2014-05-27 ENCOUNTER — Ambulatory Visit (INDEPENDENT_AMBULATORY_CARE_PROVIDER_SITE_OTHER): Payer: BLUE CROSS/BLUE SHIELD | Admitting: Internal Medicine

## 2014-05-27 DIAGNOSIS — Z0000 Encounter for general adult medical examination without abnormal findings: Secondary | ICD-10-CM

## 2014-05-27 LAB — LIPID PANEL
CHOL/HDL RATIO: 6
Cholesterol: 242 mg/dL — ABNORMAL HIGH (ref 0–200)
HDL: 41.4 mg/dL (ref >39.00)
NonHDL: 200.6
TRIGLYCERIDES: 291 mg/dL — AB (ref 0.0–149.0)
VLDL: 58.2 mg/dL — AB (ref 0.0–40.0)

## 2014-05-27 LAB — LDL CHOLESTEROL, DIRECT: Direct LDL: 158 mg/dL

## 2014-05-27 LAB — PSA: PSA: 1.41 ng/mL (ref 0.10–4.00)

## 2014-05-27 MED ORDER — CARVEDILOL 12.5 MG PO TABS: 12.5000 mg | ORAL_TABLET | Freq: Two times a day (BID) | ORAL | Status: DC

## 2014-05-27 NOTE — Patient Instructions (Signed)
Get your blood work before you leave    Stop atenolol, start carvedilol twice a day  Watch for increased BP and palpitations. Check your BP to 3 times a week, should be less than 145/ 85  Please come back to the office in 4 months for a routine check up       If you need more information about a healthy diet visit  the American Heart Association, it  is a great resource online at:  http://www.richard-flynn.net/

## 2014-05-27 NOTE — Assessment & Plan Note (Addendum)
Td 07 had a flu shot Colonoscopy 09/2010, Dr. Ardis Hughs. Next 5 years DRE normal check a PSA  Exercise, doing better, going to the [employer] or 4 times a week Diet discussed  Other issues: Hypertension and PVCs, follow-up by cardiology, symptoms are under excellent control, has noted some ED since he started losartan and beta blockers, will change atenolol to carvedilol to see if that helps with ED. Anxiety, under a lot of stress at work, we talk about yoga, counseling, meditation and exercise. He will look into those things and call if medication is needed F/u 4 months

## 2014-05-27 NOTE — Progress Notes (Signed)
Pre visit review using our clinic review tool, if applicable. No additional management support is needed unless otherwise documented below in the visit note. 

## 2014-05-27 NOTE — Progress Notes (Signed)
Subjective:    Patient ID: Shane Wood, male    DOB: Oct 03, 1955, 59 y.o.   MRN: [id number]  DOS:  05/27/2014 Type of visit - description : cpx Interval history: Since I saw him in August, he went to the ER, had palpitations, eventually saw cardiology, diagnosed with PVCs, he was also noted to be elevated, currently on losartan and atenolol. Overall feeling better. He is under a lot of stress mostly work-related, he is managing the stress okay. Ambulatory BPs are good.    Review of Systems No fever chills or weight loss No runny nose or sore throat Currently without chest pain, difficulty breathing and no palpitations No nausea, vomiting, diarrhea or blood in the stools No cough or sputum production No dysuria, gross hematuria difficulty urinating No headache or dizziness He is concerned about decreased libido and ED, thinks symptoms started after he started BP meds.  Past Medical History  Diagnosis Date  . Hyperlipidemia   . H/O cardiovascular stress test 2005     (-)  . PVC (premature ventricular contraction)   . BACK PAIN   . INSOMNIA-SLEEP DISORDER-UNSPEC   . Anxiety state, unspecified   . ELEVATED BP READING WITHOUT DX HYPERTENSION 05/03/2008    Qualifier: Diagnosis of  By: Larose Kells MD, [place]     Past Surgical History  Procedure Laterality Date  . No past surgeries      History   Social History  . Marital Status: Married    Spouse Name: N/A    Number of Children: 2  . Years of Education: N/A   Occupational History  . 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 Smoking status: Former Smoker    Quit date: 09/24/1988  . Smokeless tobacco: Not on file  . Alcohol Use: 2.4 oz/week    4 Glasses of wine per week  . Drug Use: No  . Sexual Activity: Not on file   Other Topics Concern  . Not on file   Social History Narrative   Household pt and wife      Family History  Problem Relation Age of Onset  . Lung cancer Father   . Diabetes  Maternal Grandmother   . Diabetes Maternal Grandfather   . Colon cancer Neg Hx   . Prostate cancer Neg Hx   . Heart attack Other     uncle MI at age 21s  . Stroke Other     GM, in her 41       Medication List       This list is accurate as of: 05/27/14  3:24 PM.  Always use your most recent med list.               carvedilol 12.5 MG tablet  Commonly known as:  COREG  Take 1 tablet (12.5 mg total) by mouth 2 (two) times daily with a meal.     CHOLEST CARE PO  Take 1 tablet by mouth daily.     losartan 25 MG tablet  Commonly known as:  COZAAR  Take 1 tablet (25 mg total) by mouth daily.     multivitamin tablet  Take 1 tablet by mouth daily.     OMEGA 3-6-9 PO  Take 1 tablet by mouth daily.     VITAMIN B-12 PO  Take 5,000 mcg by mouth daily.           Objective:   Physical Exam  Constitutional: He is oriented to  person, place, and time. He appears well-developed. No distress.  HENT:  Head: Normocephalic and atraumatic.  Neck: Normal range of motion. Neck supple. No thyromegaly present.  Normal carotid pulses  Cardiovascular:  RRR, no murmur, rub or gallop  Pulmonary/Chest: Effort normal. No stridor. No respiratory distress.  CTA B  Abdominal: Soft. Bowel sounds are normal. He exhibits no distension and no mass. There is no tenderness. There is no rebound and no guarding.  Genitourinary:  Rectal: No external abnormalities noted. Normal sphincter tone. No rectal masses or tenderness.  Stool: brown Prostate: Prostate gland firm and smooth, no enlargement, nodularity, tenderness, mass, asymmetry or induration.  Musculoskeletal: Normal range of motion. He exhibits no tenderness. Edema: trace edema.  Lymphadenopathy:    He has no cervical adenopathy.  Neurological: He is alert and oriented to person, place, and time. No cranial nerve deficit. He exhibits normal muscle tone. Coordination normal.  Speech normal, gait unassisted and normal for age, motor strength  appropriate for age   Skin: Skin is warm and dry. No pallor.  No jaundice  Psychiatric: He has a normal mood and affect. His behavior is normal. Judgment and thought content normal.  Vitals reviewed.        Assessment & Plan:   Problem List Items Addressed This Visit      Other   Annual physical exam    Td 07 had a flu shot Colonoscopy 09/2010, Dr. Ardis Hughs. Next 5 years DRE normal check a PSA  Exercise, doing better, going to the [facility] or 4 times a week Diet discussed  Other issues: Hypertension and PVCs, follow-up by cardiology, symptoms are under excellent control, has noted some rED since he started losartan and beta blockers, will change atenolol to carvedilol to see if that helps with ED. Anxiety, under a lot of stress at work, we took about yoga, counseling, meditation and exercise. He will look into those things and call if medication is needed F/u 4 months       Relevant Medications   carvedilol (COREG) tablet   Other Relevant Orders   Lipid panel   PSA   Testosterone, free, total

## 2014-05-28 LAB — TESTOSTERONE, FREE, TOTAL, SHBG
Sex Hormone Binding: 18 nmol/L — ABNORMAL LOW (ref 22–77)
TESTOSTERONE FREE: 86.6 pg/mL (ref 47.0–244.0)
TESTOSTERONE: 323 ng/dL (ref 300–890)
Testosterone-% Free: 2.7 % (ref 1.6–2.9)

## 2014-06-02 ENCOUNTER — Telehealth: Payer: Self-pay | Admitting: Internal Medicine

## 2014-06-02 ENCOUNTER — Encounter: Payer: Self-pay | Admitting: Internal Medicine

## 2014-06-02 ENCOUNTER — Other Ambulatory Visit: Payer: Self-pay

## 2014-06-02 MED ORDER — ATORVASTATIN CALCIUM 20 MG PO TABS: 20.0000 mg | ORAL_TABLET | Freq: Every day | ORAL | Status: DC

## 2014-06-02 NOTE — Telephone Encounter (Signed)
Caller name: [name], [name] Relation to pt: self  Call back number: [phone] Pharmacy:  Reason for call:  Pt returning your call regarding lab results. Pt viewed on mychart in need of clarification

## 2014-06-02 NOTE — Telephone Encounter (Signed)
Spoke with Pt, informed him of lab results. Instructed him to restart Lipitor 20 mg 1 tablet daily. Informed him I have sent Lipitor to [facility]. Pt verbalized understanding.

## 2014-06-09 ENCOUNTER — Encounter: Payer: Self-pay | Admitting: Internal Medicine

## 2014-06-13 ENCOUNTER — Encounter: Payer: Self-pay | Admitting: Internal Medicine

## 2014-06-17 ENCOUNTER — Telehealth: Payer: Self-pay | Admitting: Internal Medicine

## 2014-06-17 NOTE — Telephone Encounter (Signed)
Called pt to see how he is doing, we tried to stop carvedilol to avoid erectile dysfunction but he had palpitations so he is back on carvedilol. He is in the a lot of stress, we talk about possible medication, he is not ready yet. He actually has an appointment to see one of our counselors. Recommend the patient to keep me posted, if he or the  counselor believe he may benefit from medications they will let me know.

## 2014-06-29 ENCOUNTER — Ambulatory Visit (INDEPENDENT_AMBULATORY_CARE_PROVIDER_SITE_OTHER): Payer: BLUE CROSS/BLUE SHIELD | Admitting: Licensed Clinical Social Worker

## 2014-06-29 DIAGNOSIS — F321 Major depressive disorder, single episode, moderate: Secondary | ICD-10-CM | POA: Diagnosis not present

## 2014-07-06 ENCOUNTER — Ambulatory Visit (INDEPENDENT_AMBULATORY_CARE_PROVIDER_SITE_OTHER): Payer: BLUE CROSS/BLUE SHIELD | Admitting: Licensed Clinical Social Worker

## 2014-07-06 DIAGNOSIS — F321 Major depressive disorder, single episode, moderate: Secondary | ICD-10-CM | POA: Diagnosis not present

## 2014-07-09 ENCOUNTER — Other Ambulatory Visit: Payer: Self-pay

## 2014-07-09 ENCOUNTER — Encounter: Payer: Self-pay | Admitting: Internal Medicine

## 2014-07-09 ENCOUNTER — Ambulatory Visit (INDEPENDENT_AMBULATORY_CARE_PROVIDER_SITE_OTHER): Payer: BLUE CROSS/BLUE SHIELD | Admitting: Internal Medicine

## 2014-07-09 VITALS — BP 132/78 | HR 65 | Temp 98.0°F | Ht 71.0 in | Wt 216.5 lb

## 2014-07-09 DIAGNOSIS — I493 Ventricular premature depolarization: Secondary | ICD-10-CM

## 2014-07-09 DIAGNOSIS — F419 Anxiety disorder, unspecified: Secondary | ICD-10-CM

## 2014-07-09 NOTE — Assessment & Plan Note (Signed)
Since last visit, he is doing a little better, sees Almyra Free one of our counselors. He will come back in June for a checkup . So far he decided not to use medication but knows to call me if/when ready for meds

## 2014-07-09 NOTE — Progress Notes (Signed)
Subjective:    Patient ID: Shane Wood, male    DOB: 01/12/1956, 59 y.o.   MRN: [id number]  DOS:  07/09/2014 Type of visit - description : f/u Interval history:  Since the last visit, we change atenolol to carvedilol to see some of the side effects could be avoided but he continue with mild erectile dysfunction. Also he tried to stop carvedilol and 36 hours later he had symptomatic PVCs. He is back on carvedilol, no further symptoms. Ambulatory BPs consistently 135/70 on average  Review of Systems Denies chest pain, difficulty breathing or lower stomach edema Still having anxiety, depression issues, actually seeing a counselor. Not suicidal ideas He got a stationary bike, more  active physically.  Past Medical History  Diagnosis Date  . Hyperlipidemia   . H/O cardiovascular stress test 2005     (-)  . PVC (premature ventricular contraction)   . BACK PAIN   . INSOMNIA-SLEEP DISORDER-UNSPEC   . Anxiety state, unspecified   . ELEVATED BP READING WITHOUT DX HYPERTENSION 05/03/2008    Qualifier: Diagnosis of  By: Larose Kells MD, [place]     Past Surgical History  Procedure Laterality Date  . No past surgeries      History   Social History  . Marital Status: Married    Spouse Name: N/A  . Number of Children: 2  . Years of Education: N/A   Occupational History  . 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 Smoking status: Former Smoker    Quit date: 09/24/1988  . Smokeless tobacco: Not on file  . Alcohol Use: 2.4 oz/week    4 Glasses of wine per week  . Drug Use: No  . Sexual Activity: Not on file   Other Topics Concern  . Not on file   Social History Narrative   Household pt and wife         Medication List       This list is accurate as of: 07/09/14 11:59 PM.  Always use your most recent med list.               atorvastatin 20 MG tablet  Commonly known as:  LIPITOR  Take 1 tablet (20 mg total) by mouth daily.     carvedilol 12.5 MG  tablet  Commonly known as:  COREG  Take 1 tablet (12.5 mg total) by mouth 2 (two) times daily with a meal.     CHOLEST CARE PO  Take 1 tablet by mouth daily.     losartan 25 MG tablet  Commonly known as:  COZAAR  Take 1 tablet (25 mg total) by mouth daily.     multivitamin tablet  Take 1 tablet by mouth daily.     OMEGA 3-6-9 PO  Take 1 tablet by mouth daily.     VITAMIN B-12 PO  Take 5,000 mcg by mouth daily.           Objective:   Physical Exam BP 132/78 mmHg  Pulse 65  Temp(Src) 98 F (36.7 C) (Oral)  Ht 5\' 11"  (1.803 m)  Wt 216 lb 8 oz (98.204 kg)  BMI 30.21 kg/m2  SpO2 98% General:   Well developed, well nourished . NAD.  HEENT:  Normocephalic . Face symmetric, atraumatic Lungs:  CTA B Normal respiratory effort, no intercostal retractions, no accessory muscle use. Heart: RRR,  no murmur.  Muscle skeletal: no pretibial edema bilaterally  Skin: Not pale. Not jaundice Neurologic:  alert & oriented X3.  Speech normal, gait appropriate for age and unassisted Psych--  Cognition and judgment appear intact.  Cooperative with normal attention span and concentration.   He seems to be better emotionally today compared to previous visits     Assessment & Plan:

## 2014-07-09 NOTE — Progress Notes (Signed)
Pre visit review using our clinic review tool, if applicable. No additional management support is needed unless otherwise documented below in the visit note. 

## 2014-07-09 NOTE — Patient Instructions (Signed)
Try to drop carvedilol to 1 tab a day or 1/2 tab twice a day

## 2014-07-09 NOTE — Assessment & Plan Note (Signed)
Since the last visit, we switch atenolol to carvedilol trying to prevent PVCs without causing ED.  ED is not much better, he did try to come off carvedilol and PVCs resurface. Plan: Continue with carvedilol, we agreed he will try to decrease dose to a minimum (1 tablet daily or half tablet twice a day).

## 2014-07-27 ENCOUNTER — Ambulatory Visit (INDEPENDENT_AMBULATORY_CARE_PROVIDER_SITE_OTHER): Payer: BLUE CROSS/BLUE SHIELD | Admitting: Licensed Clinical Social Worker

## 2014-07-27 DIAGNOSIS — F321 Major depressive disorder, single episode, moderate: Secondary | ICD-10-CM | POA: Diagnosis not present

## 2014-08-10 ENCOUNTER — Ambulatory Visit (INDEPENDENT_AMBULATORY_CARE_PROVIDER_SITE_OTHER): Payer: BLUE CROSS/BLUE SHIELD | Admitting: Licensed Clinical Social Worker

## 2014-08-10 DIAGNOSIS — F321 Major depressive disorder, single episode, moderate: Secondary | ICD-10-CM | POA: Diagnosis not present

## 2014-08-17 ENCOUNTER — Encounter: Payer: Self-pay | Admitting: Internal Medicine

## 2014-08-19 ENCOUNTER — Other Ambulatory Visit: Payer: Self-pay

## 2014-08-20 ENCOUNTER — Other Ambulatory Visit: Payer: Self-pay | Admitting: Internal Medicine

## 2014-08-31 ENCOUNTER — Ambulatory Visit (INDEPENDENT_AMBULATORY_CARE_PROVIDER_SITE_OTHER): Payer: BLUE CROSS/BLUE SHIELD | Admitting: Licensed Clinical Social Worker

## 2014-08-31 DIAGNOSIS — F321 Major depressive disorder, single episode, moderate: Secondary | ICD-10-CM

## 2014-10-04 ENCOUNTER — Encounter: Payer: Self-pay | Admitting: Internal Medicine

## 2014-10-04 ENCOUNTER — Ambulatory Visit (INDEPENDENT_AMBULATORY_CARE_PROVIDER_SITE_OTHER): Payer: BLUE CROSS/BLUE SHIELD | Admitting: Licensed Clinical Social Worker

## 2014-10-04 ENCOUNTER — Ambulatory Visit (INDEPENDENT_AMBULATORY_CARE_PROVIDER_SITE_OTHER): Payer: BLUE CROSS/BLUE SHIELD | Admitting: Internal Medicine

## 2014-10-04 VITALS — BP 132/88 | HR 62 | Temp 97.5°F | Ht 71.0 in | Wt 200.2 lb

## 2014-10-04 DIAGNOSIS — E785 Hyperlipidemia, unspecified: Secondary | ICD-10-CM | POA: Diagnosis not present

## 2014-10-04 DIAGNOSIS — F321 Major depressive disorder, single episode, moderate: Secondary | ICD-10-CM | POA: Diagnosis not present

## 2014-10-04 DIAGNOSIS — I493 Ventricular premature depolarization: Secondary | ICD-10-CM

## 2014-10-04 DIAGNOSIS — F419 Anxiety disorder, unspecified: Secondary | ICD-10-CM

## 2014-10-04 DIAGNOSIS — R030 Elevated blood-pressure reading, without diagnosis of hypertension: Secondary | ICD-10-CM

## 2014-10-04 DIAGNOSIS — D229 Melanocytic nevi, unspecified: Secondary | ICD-10-CM | POA: Diagnosis not present

## 2014-10-04 LAB — LIPID PANEL
CHOLESTEROL: 151 mg/dL (ref 0–200)
HDL: 48 mg/dL (ref >39.00)
LDL Cholesterol: 85 mg/dL (ref 0–99)
NonHDL: 103
Total CHOL/HDL Ratio: 3
Triglycerides: 91 mg/dL (ref 0.0–149.0)
VLDL: 18.2 mg/dL (ref 0.0–40.0)

## 2014-10-04 LAB — AST: AST: 14 U/L (ref 0–37)

## 2014-10-04 LAB — ALT: ALT: 17 U/L (ref 0–53)

## 2014-10-04 NOTE — Assessment & Plan Note (Signed)
H/o Elevated BP, Since the last visit he stopped taking medications, doing great with exercise, has lost 16 pounds and feels great. Plan: Continue watching his diet, checking BPs and exercising

## 2014-10-04 NOTE — Assessment & Plan Note (Signed)
  2 moles in the back reportedly have increase in size, refer to dermatology

## 2014-10-04 NOTE — Assessment & Plan Note (Signed)
High cholesterol, Started Lipitor based on the last FLP, will check labs

## 2014-10-04 NOTE — Assessment & Plan Note (Signed)
Anxiety, Improved, currently taking no medication, still doing some counseling.

## 2014-10-04 NOTE — Progress Notes (Signed)
Subjective:    Patient ID: Shane Wood, male    DOB: [birth year], 59 y.o.   MRN: [id number]  DOS:  10/04/2014 Type of visit - description : rov Interval history: Anxiety, much improved lately, not taking any medication. Changing jobs. Palpitations, essentially asymptomatic, he is changing jobs and buying a house, obviously some stress, had a few palpitations this weekend otherwise no symptoms Has 2 moles in the back, increasing  in size? High cholesterol, good compliance with medications, no apparent side effects  elevated BP, ambulatory blood pressures consistently 120-130/70 Developing a cough, wonders what medications he can take  Review of Systems No fever chills No chest congestion, mild cough. No nausea, vomiting, diarrhea  Past Medical History  Diagnosis Date  . Hyperlipidemia   . H/O cardiovascular stress test 2005     (-)  . PVC (premature ventricular contraction)   . BACK PAIN   . INSOMNIA-SLEEP DISORDER-UNSPEC   . Anxiety state, unspecified   . ELEVATED BP READING WITHOUT DX HYPERTENSION 05/03/2008    Qualifier: Diagnosis of  By: Larose Kells MD, [place]     Past Surgical History  Procedure Laterality Date  . No past surgeries      History   Social History  . Marital Status: Married    Spouse Name: N/A  . Number of Children: 2  . Years of Education: N/A   Occupational History  . 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 Smokeless tobacco: Not on file  . Alcohol Use: 2.4 oz/week    4 Glasses of wine per week  . Drug Use: No  . Sexual Activity: Not on file   Other Topics Concern  . Not on file   Social History Narrative   Household pt and wife         Medication List       This list is accurate as of: 10/04/14  8:36 AM.  Always use your most recent med list.               atorvastatin 20 MG tablet  Commonly known as:  LIPITOR  Take 1 tablet (20 mg total) by mouth  daily.     carvedilol 12.5 MG tablet  Commonly known as:  COREG  Take 1 tablet (12.5 mg total) by mouth 2 (two) times daily with a meal.     CHOLEST CARE PO  Take 1 tablet by mouth daily.     losartan 25 MG tablet  Commonly known as:  COZAAR  Take 1 tablet (25 mg total) by mouth daily.     multivitamin tablet  Take 1 tablet by mouth daily.     OMEGA 3-6-9 PO  Take 1 tablet by mouth daily.     VITAMIN B-12 PO  Take 5,000 mcg by mouth daily.           Objective:   Physical Exam BP 132/88 mmHg  Pulse 62  Temp(Src) 97.5 F (36.4 C) (Oral)  Ht 5\' 11"  (1.803 m)  Wt 200 lb 4 oz (90.833 kg)  BMI 27.94 kg/m2  SpO2 98% General:   Well developed, well nourished . NAD.  HEENT:  Normocephalic . Face symmetric, atraumatic Right TM obscured by wax, left TM normal not, nose is slightly congested, throat without redness Lungs:  CTA B Normal respiratory effort, no intercostal retractions, no accessory muscle use. Heart: RRR,  no murmur.  No pretibial edema bilaterally  Skin: Not pale. Not jaundice; multiple moles in the back, some look like SK other simply nevus  Neurologic:  alert & oriented X3.  Speech normal, gait appropriate for age and unassisted Psych--  Cognition and judgment appear intact.  Cooperative with normal attention span and concentration.  Behavior appropriate. No anxious or depressed appearing.         Assessment & Plan:    URI, conservative treatment

## 2014-10-04 NOTE — Progress Notes (Signed)
Pre visit review using our clinic review tool, if applicable. No additional management support is needed unless otherwise documented below in the visit note. 

## 2014-10-04 NOTE — Assessment & Plan Note (Signed)
Palpitations not an issue at this point, recommend carvedilol prn

## 2014-10-04 NOTE — Patient Instructions (Addendum)
Go to tthe lab for blood work   Rest, fluids , tylenol If  cough, take Mucinex DM twice a day as needed  If nasal  congestion use OTC Nasocort or Flonase : 2 nasal sprays on each side of the nose daily until you feel better

## 2014-10-20 ENCOUNTER — Encounter: Payer: Self-pay | Admitting: Internal Medicine

## 2014-11-08 ENCOUNTER — Telehealth: Payer: Self-pay

## 2014-11-08 NOTE — Telephone Encounter (Signed)
Letter printed and mailed to Pt.  

## 2014-11-08 NOTE — Telephone Encounter (Signed)
-----   Message from Colon Branch, MD sent at 11/08/2014  7:57 AM EDT ----- Regarding: RE: proof  Ok, just send him the letter jp ----- Message -----    From: Wilfrid Lund, CMA    Sent: 11/08/2014   7:44 AM      To: Colon Branch, MD Subject: RE: proof                                      Pt was going to fax [name] his insurance biometrix form, but I have not seen it yet.   ----- Message -----    From: Colon Branch, MD    Sent: 11/07/2014  11:59 AM      To: Wilfrid Lund, CMA Subject: proof                                          Have we been able to help with this patient's proof of CPX?[name]  If not, write a simple  Letter:  To whom it may concern  Mr.------ had a complete physical exam on -----

## 2014-12-13 ENCOUNTER — Ambulatory Visit (INDEPENDENT_AMBULATORY_CARE_PROVIDER_SITE_OTHER): Payer: BLUE CROSS/BLUE SHIELD | Admitting: Licensed Clinical Social Worker

## 2014-12-13 DIAGNOSIS — F321 Major depressive disorder, single episode, moderate: Secondary | ICD-10-CM

## 2015-03-09 ENCOUNTER — Ambulatory Visit (INDEPENDENT_AMBULATORY_CARE_PROVIDER_SITE_OTHER): Payer: BLUE CROSS/BLUE SHIELD | Admitting: Internal Medicine

## 2015-03-09 ENCOUNTER — Encounter: Payer: Self-pay | Admitting: Internal Medicine

## 2015-03-09 VITALS — BP 124/72 | HR 58 | Temp 97.5°F | Ht 71.0 in | Wt 208.5 lb

## 2015-03-09 DIAGNOSIS — F419 Anxiety disorder, unspecified: Secondary | ICD-10-CM | POA: Diagnosis not present

## 2015-03-09 DIAGNOSIS — D229 Melanocytic nevi, unspecified: Secondary | ICD-10-CM | POA: Diagnosis not present

## 2015-03-09 DIAGNOSIS — E785 Hyperlipidemia, unspecified: Secondary | ICD-10-CM | POA: Diagnosis not present

## 2015-03-09 DIAGNOSIS — Z09 Encounter for follow-up examination after completed treatment for conditions other than malignant neoplasm: Secondary | ICD-10-CM

## 2015-03-09 NOTE — Progress Notes (Signed)
Pre visit review using our clinic review tool, if applicable. No additional management support is needed unless otherwise documented below in the visit note. 

## 2015-03-09 NOTE — Assessment & Plan Note (Signed)
Hyperlipidemia: Started statins few months ago, excellent response, no apparent side effects. No change Anxiety: On no medications, had counseling, doing well and present time. Palpitations: Not a major issue at this point. Nevus: Was referred to dermatology at the last visit, saw them, was recommended no biopsy. Shoulder pain: Exam is normal, recommend OTCs, will call if not improving for a sports medicine referral RTC 05-2015 for a CPX

## 2015-03-09 NOTE — Patient Instructions (Signed)
  Next visit  for a physical exam, fasting by 05-2015 Please schedule an appointment at the front desk

## 2015-03-09 NOTE — Progress Notes (Signed)
Subjective:    Patient ID: Shane Wood, male    DOB: 1955/06/13, 59 y.o.   MRN: [id number]  DOS:  03/09/2015 Type of visit - description : Routine office visit Interval history: Hyperlipidemia: Started statins, good results, no apparent side effects Anxiety: Currently on no medications, managing symptoms well, stress sometimes high at work Left shoulder pain for 3 or 4 weeks, at different places (lateral, anterior etc.) no neck pain, symptoms not severe. No injury but symptoms started after a business trip when he carried a bag.  Review of Systems No chest pain or difficulty breathing No urgent edema Occasional palpitations, not severe, usually associated with stress.  No myalgias per se  Past Medical History  Diagnosis Date  . Hyperlipidemia   . H/O cardiovascular stress test 2005     (-)  . PVC (premature ventricular contraction)   . BACK PAIN   . INSOMNIA-SLEEP DISORDER-UNSPEC   . Anxiety state, unspecified   . ELEVATED BP READING WITHOUT DX HYPERTENSION 05/03/2008    Qualifier: Diagnosis of  By: Larose Kells MD, [place]     Past Surgical History  Procedure Laterality Date  . No past surgeries      Social History   Social History  . Marital Status: Married    Spouse Name: N/A  . Number of Children: 2  . Years of Education: N/A   Occupational History  . 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 Smokeless tobacco: Not on file  . Alcohol Use: 2.4 oz/week    4 Glasses of wine per week  . Drug Use: No  . Sexual Activity: Not on file   Other Topics Concern  . Not on file   Social History Narrative   Household pt and wife         Medication List       This list is accurate as of: 03/09/15  5:09 PM.  Always use your most recent med list.               atorvastatin 20 MG tablet  Commonly known as:  LIPITOR  Take 1 tablet (20 mg total) by mouth daily.     carvedilol 12.5 MG  tablet  Commonly known as:  COREG  Take 6.25-12.5 mg by mouth 2 (two) times daily as needed (palpitations).     CHOLEST CARE PO  Take 1 tablet by mouth daily.     multivitamin tablet  Take 1 tablet by mouth daily.     OMEGA 3-6-9 PO  Take 1 tablet by mouth daily.     VITAMIN B-12 PO  Take 5,000 mcg by mouth daily.           Objective:   Physical Exam BP 124/72 mmHg  Pulse 58  Temp(Src) 97.5 F (36.4 C) (Oral)  Ht 5\' 11"  (1.803 m)  Wt 208 lb 8 oz (94.575 kg)  BMI 29.09 kg/m2  SpO2 98% General:   Well developed, well nourished . NAD.  HEENT:  Normocephalic . Face symmetric, atraumatic Lungs:  CTA B Normal respiratory effort, no intercostal retractions, no accessory muscle use. MSK: Shoulders symmetric, range of motion normal. No TTP Heart: RRR,  no murmur.  No pretibial edema bilaterally  Skin: Not pale. Not jaundice Neurologic:  alert & oriented X3.  Speech normal, gait appropriate for age and unassisted Psych--  Cognition and judgment appear intact.  Cooperative  with normal attention span and concentration.  Behavior appropriate. No anxious or depressed appearing.      Assessment & Plan:   Assessment> Hyperlipidemia Elevated BP Anxiety, insomnia PVCs -palpitations-- BB prn, sx usually related to anxiety Stress test 2005 negative  PLAN Hyperlipidemia: Started statins few months ago, excellent response, no apparent side effects. No change Anxiety: On no medications, had counseling, doing well and present time. Palpitations: Not a major issue at this point. Nevus: Was referred to dermatology at the last visit, saw them, was recommended no biopsy. Shoulder pain: Exam is normal, recommend OTCs, will call if not improving for a sports medicine referral RTC 05-2015 for a CPX

## 2015-03-31 ENCOUNTER — Other Ambulatory Visit: Payer: Self-pay | Admitting: Internal Medicine

## 2015-05-02 ENCOUNTER — Ambulatory Visit: Payer: BLUE CROSS/BLUE SHIELD | Admitting: Cardiovascular Disease

## 2015-05-05 NOTE — Progress Notes (Signed)
Cardiology Office Note   Date:  05/06/2015   ID:  Shane Wood, DOB [birth year], MRN [id number]  PCP:  Kathlene November, MD  Cardiologist:  Dr. Johnsie Cancel    No chief complaint on file.     History of Present Illness: Shane Wood is a 60 y.o. male who presents for palpitations.     He has a hx of increased frequency of PVCs over the past several days, worse this 8/29 . They're exacerbated by stress. There is no associated chest pain, shortness of breath, diaphoresis or nausea. When severe they occur every few seconds. He was seen by his PCP but no PVCs were noted at that time. He discussed with his PCP the possibility of being placed on Holter monitor. The patient has cut down on his caffeine use but still drinks about 3 cups of coffee a day.  Was placed on atenolol--now coreg prn..   Echo 10/15 AV sclerosis normal EF Reviewed Study Conclusions  - Left ventricle: False tendon in LV apex of no clinical significance. The cavity size was normal. Systolic function was normal. The estimated ejection fraction was in the range of 55% to 60%. Wall motion was normal; there were no regional wall motion abnormalities. - Aortic valve: No doppler done of the AV to assess peak and mean gradient and AV velocity. Need limited study to assess. Moderate diffuse thickening and calcification, consistent with sclerosis. Valve area (VTI): 2.75 cm^2. Valve area (Vmax): 2.35 cm^2. Valve area (Vmean): 2.73 cm^2. - Aorta: Aortic root dimension: 39 mm (ED). - Ascending aorta: The ascending aorta was mildly dilated. - Left atrium: The atrium was mildly dilated. - Atrial septum: There was increased thickness of the septum, consistent with lipomatous hypertrophy.  ETT normal no Ischemia HTN and PVC;s  BMET 12/15 normal   Recent lipids stable. Followed by Dr. Larose Kells.   Today he has had once episode of increased PVCs.  He took the coreg for a couple of days then they were resolved.  No  pain or SOB.  Has slowed exercising due to cold but will get back to it, use bike with computer to be like spin class.    Past Medical History  Diagnosis Date  . Hyperlipidemia   . H/O cardiovascular stress test 2005     (-)  . PVC (premature ventricular contraction)   . BACK PAIN   . INSOMNIA-SLEEP DISORDER-UNSPEC   . Anxiety state, unspecified   . ELEVATED BP READING WITHOUT DX HYPERTENSION 05/03/2008    Qualifier: Diagnosis of  By: Larose Kells MD, [place]     Past Surgical History  Procedure Laterality Date  . No past surgeries       Current Outpatient Prescriptions  Medication Sig Dispense Refill  . Ascorbic Acid (VITAMIN C) 1000 MG tablet Take 1,000 mg by mouth daily.    [name] atorvastatin (LIPITOR) 20 MG tablet Take 1 tablet (20 mg total) by mouth daily. 90 tablet 2  . carvedilol (COREG) 12.5 MG tablet Take 6.25-12.5 mg by mouth 2 (two) times daily as needed (palpitations).    . Cyanocobalamin (VITAMIN B-12 PO) Take 5,000 mcg by mouth daily.    . Multiple Vitamin (MULTIVITAMIN) tablet Take 1 tablet by mouth daily.      Ernestine Conrad 3-6-9 Fatty Acids (OMEGA 3-6-9 PO) Take 1 tablet by mouth daily.     No current facility-administered medications for this visit.    Allergies:   Review of patient's allergies indicates no known allergies.  Social History:  The patient  reports that he quit smoking about 26 years ago. He does not have any smokeless tobacco history on file. He reports that he drinks about 2.4 oz of alcohol per week. He reports that he does not use illicit drugs.   Family History:  The patient's family history includes Diabetes in his maternal grandfather and maternal grandmother; Heart attack in his other; Hypertension in his mother; Lung cancer in his father; Stroke in his other. There is no history of Colon cancer or Prostate cancer.    ROS:  General: recent cold no fevers, no weight changes Skin:no rashes or ulcers HEENT:no blurred vision, no congestion CV:see  HPI PUL:see HPI GI:no diarrhea constipation or melena, no indigestion GU:no hematuria, no dysuria MS:no joint pain, no claudication Neuro:no syncope, no lightheadedness Endo:no diabetes, no thyroid disease  Wt Readings from Last 3 Encounters:  05/06/15 211 lb (95.709 kg)  03/09/15 208 lb 8 oz (94.575 kg)  10/04/14 200 lb 4 oz (90.833 kg)     PHYSICAL EXAM: VS:  BP 130/80 mmHg  Pulse 80  Ht 5\' 11"  (1.803 m)  Wt 211 lb (95.709 kg)  BMI 29.44 kg/m2 , BMI Body mass index is 29.44 kg/(m^2). General:Pleasant affect, NAD Skin:Warm and dry, brisk capillary refill HEENT:normocephalic, sclera clear, mucus membranes moist Neck:supple, no JVD, no bruits  Heart:S1S2 RRR without murmur, gallup, rub or click Lungs:clear without rales, rhonchi, or wheezes [id number], non tender, + BS, do not palpate liver spleen or masses Ext:no lower ext edema, 2+ pedal pulses, 2+ radial pulses Neuro:alert and oriented, MAE, follows commands, + facial symmetry    EKG:  EKG is ordered today. The ekg ordered today demonstrates SR Lt atrial enlargement. No acute changes.   Recent Labs: 10/04/2014: ALT 17    Lipid Panel    Component Value Date/Time   CHOL 151 10/04/2014 0852   TRIG 91.0 10/04/2014 0852   HDL 48.00 10/04/2014 0852   CHOLHDL 3 10/04/2014 0852   VLDL 18.2 10/04/2014 0852   LDLCALC 85 10/04/2014 0852   LDLDIRECT 158.0 05/27/2014 1051       Other studies Reviewed: Additional studies/ records that were reviewed today include: .   ASSESSMENT AND PLAN:  1. Palpitations -PVCs one episode controlled with coreg- he has them with stress.  2.  Elevated lipids now controlled on lipitor followed by Dr. Larose Kells    3. Anxiety- improved.   Current medicines are reviewed with the patient today.  The patient Has no concerns regarding medicines.  The following changes have been made:  See above Labs/ tests ordered today include:see above  Disposition:   FU:  see  above  Lennie Muckle, NP  05/06/2015 8:46 AM    [facility] [place], [place], [place], [place] Phone: [phone]; Fax: [phone]

## 2015-05-06 ENCOUNTER — Ambulatory Visit (INDEPENDENT_AMBULATORY_CARE_PROVIDER_SITE_OTHER): Payer: BLUE CROSS/BLUE SHIELD | Admitting: Cardiology

## 2015-05-06 ENCOUNTER — Encounter: Payer: Self-pay | Admitting: Cardiology

## 2015-05-06 VITALS — BP 130/80 | HR 80 | Ht 71.0 in | Wt 211.0 lb

## 2015-05-06 DIAGNOSIS — I493 Ventricular premature depolarization: Secondary | ICD-10-CM

## 2015-05-06 DIAGNOSIS — R002 Palpitations: Secondary | ICD-10-CM

## 2015-05-06 DIAGNOSIS — F419 Anxiety disorder, unspecified: Secondary | ICD-10-CM | POA: Diagnosis not present

## 2015-05-06 DIAGNOSIS — E785 Hyperlipidemia, unspecified: Secondary | ICD-10-CM

## 2015-05-06 DIAGNOSIS — I10 Essential (primary) hypertension: Secondary | ICD-10-CM | POA: Diagnosis not present

## 2015-05-06 NOTE — Patient Instructions (Signed)
Medication Instructions:  Your physician recommends that you continue on your current medications as directed. Please refer to the Current Medication list given to you today.   Labwork: NONE ORDERED  Testing/Procedures: NONE ORDERED   Follow-Up: Your physician wants you to follow-up in: 1 YEAR WITH DR. NISHAN You will receive a reminder letter in the mail two months in advance. If you don't receive a letter, please call our office to schedule the follow-up appointment.   Any Other Special Instructions Will Be Listed Below (If Applicable).     If you need a refill on your cardiac medications before your next appointment, please call your pharmacy.   

## 2015-07-07 ENCOUNTER — Telehealth: Payer: Self-pay | Admitting: *Deleted

## 2015-07-07 NOTE — Telephone Encounter (Signed)
Unable to reach patient at time of pre-visit call. Left message for patient to return call when available.  

## 2015-07-08 ENCOUNTER — Ambulatory Visit (INDEPENDENT_AMBULATORY_CARE_PROVIDER_SITE_OTHER): Payer: BLUE CROSS/BLUE SHIELD | Admitting: Internal Medicine

## 2015-07-08 ENCOUNTER — Encounter: Payer: Self-pay | Admitting: Internal Medicine

## 2015-07-08 VITALS — BP 124/76 | HR 60 | Temp 97.8°F | Ht 71.0 in | Wt 213.1 lb

## 2015-07-08 DIAGNOSIS — Z09 Encounter for follow-up examination after completed treatment for conditions other than malignant neoplasm: Secondary | ICD-10-CM

## 2015-07-08 DIAGNOSIS — Z23 Encounter for immunization: Secondary | ICD-10-CM | POA: Diagnosis not present

## 2015-07-08 DIAGNOSIS — Z0000 Encounter for general adult medical examination without abnormal findings: Secondary | ICD-10-CM | POA: Diagnosis not present

## 2015-07-08 LAB — CBC WITH DIFFERENTIAL/PLATELET
Basophils Absolute: 0 10*3/uL (ref 0.0–0.1)
Basophils Relative: 0.2 % (ref 0.0–3.0)
Eosinophils Absolute: 0.2 10*3/uL (ref 0.0–0.7)
Eosinophils Relative: 3.4 % (ref 0.0–5.0)
HCT: 40.5 % (ref 39.0–52.0)
HEMOGLOBIN: 13.6 g/dL (ref 13.0–17.0)
LYMPHS PCT: 33.2 % (ref 12.0–46.0)
Lymphs Abs: 2.2 10*3/uL (ref 0.7–4.0)
MCHC: 33.6 g/dL (ref 30.0–36.0)
MCV: 85.1 fl (ref 78.0–100.0)
MONOS PCT: 6.4 % (ref 3.0–12.0)
Monocytes Absolute: 0.4 10*3/uL (ref 0.1–1.0)
Neutro Abs: 3.8 10*3/uL (ref 1.4–7.7)
Neutrophils Relative %: 56.8 % (ref 43.0–77.0)
Platelets: 225 10*3/uL (ref 150.0–400.0)
RBC: 4.76 Mil/uL (ref 4.22–5.81)
RDW: 14.7 % (ref 11.5–15.5)
WBC: 6.8 10*3/uL (ref 4.0–10.5)

## 2015-07-08 LAB — LIPID PANEL
CHOL/HDL RATIO: 5
Cholesterol: 209 mg/dL — ABNORMAL HIGH (ref 0–200)
HDL: 44.3 mg/dL (ref >39.00)
LDL CALC: 129 mg/dL — AB (ref 0–99)
NonHDL: 164.83
TRIGLYCERIDES: 179 mg/dL — AB (ref 0.0–149.0)
VLDL: 35.8 mg/dL (ref 0.0–40.0)

## 2015-07-08 LAB — BASIC METABOLIC PANEL
BUN: 21 mg/dL (ref 6–23)
CHLORIDE: 104 meq/L (ref 96–112)
CO2: 26 meq/L (ref 19–32)
Calcium: 9.2 mg/dL (ref 8.4–10.5)
Creatinine, Ser: 0.95 mg/dL (ref 0.40–1.50)
GFR: 85.94 mL/min (ref >60.00)
Glucose, Bld: 84 mg/dL (ref 70–99)
Potassium: 3.8 mEq/L (ref 3.5–5.1)
Sodium: 139 mEq/L (ref 135–145)

## 2015-07-08 LAB — TSH: TSH: 1.62 u[IU]/mL (ref 0.35–4.50)

## 2015-07-08 LAB — AST: AST: 15 U/L (ref 0–37)

## 2015-07-08 LAB — PSA: PSA: 0.92 ng/mL (ref 0.10–4.00)

## 2015-07-08 LAB — ALT: ALT: 19 U/L (ref 0–53)

## 2015-07-08 NOTE — Assessment & Plan Note (Addendum)
Td 07 and today Pro-cons of zostavax discussed, elected to proceed  Colonoscopy 09/2010, Dr. Ardis Hughs. Will wait for GI letter  PSAs reviewed, slt increase the last time was checked, DRE today wnl, recheck a PSA  Exercise,diet discussed   Labs: BMP, AST, ALT, FLP, CBC, TSH, PSA, HIV

## 2015-07-08 NOTE — Progress Notes (Signed)
Subjective:    Patient ID: Shane Wood, male    DOB: 01-Jun-1955, 60 y.o.   MRN: [id number]  DOS:  07/08/2015 Type of visit - description : CPX Interval history: Feeling very well, no concerns.    Review of Systems Constitutional: No fever. No chills. No unexplained wt changes. No unusual sweats  HEENT: No dental problems, no ear discharge, no facial swelling, no voice changes. No eye discharge, no eye  redness , no  intolerance to light   Respiratory: No wheezing , no  difficulty breathing. No cough , no mucus production  Cardiovascular: No CP, no leg swelling , no  Palpitations  GI: no nausea, no vomiting, no diarrhea , no  abdominal pain.  No blood in the stools. No dysphagia, no odynophagia    Endocrine: No polyphagia, no polyuria , no polydipsia  GU: No dysuria, gross hematuria, difficulty urinating. No urinary urgency, no frequency.  Musculoskeletal: No joint swellings or unusual aches or pains  Skin: No change in the color of the skin, palor , no  Rash  Allergic, immunologic: No environmental allergies , no  food allergies  Neurological: No dizziness no  syncope. No headaches. No diplopia, no slurred, no slurred speech, no motor deficits, no facial  Numbness  Hematological: No enlarged lymph nodes, no easy bruising , no unusual bleedings  Psychiatry: No suicidal ideas, no hallucinations, no beavior problems, no confusion.  No unusual/severe anxiety, no depression   Past Medical History  Diagnosis Date  . Hyperlipidemia   . H/O cardiovascular stress test 2005     (-)  . PVC (premature ventricular contraction)   . BACK PAIN   . INSOMNIA-SLEEP DISORDER-UNSPEC   . Anxiety state, unspecified   . ELEVATED BP READING WITHOUT DX HYPERTENSION 05/03/2008    Qualifier: Diagnosis of  By: Larose Kells MD, [place]     Past Surgical History  Procedure Laterality Date  . Tonsillectomy      age 5 y/o    Social History   Social History  . Marital Status: Married   Spouse Name: N/A  . Number of Children: 2  . Years of Education: N/A   Occupational History  . Programmer, multimedia for [employer] History Main Topics  . Smoking status: Former Smoker    Quit date: 09/24/1988  . Smokeless tobacco: Not on file  . Alcohol Use: 2.4 oz/week    4 Glasses of wine per week  . Drug Use: No  . Sexual Activity: Not on file   Other Topics Concern  . Not on file   Social History Narrative   Household pt and wife      Family History  Problem Relation Age of Onset  . Lung cancer Father     smoker  . Diabetes Maternal Grandmother   . Diabetes Maternal Grandfather   . Colon cancer Neg Hx   . Prostate cancer Neg Hx   . Heart attack Other     uncle MI at age 1s  . Stroke Other     GM, in her 34  . Hypertension Mother        Medication List       This list is accurate as of: 07/08/15 11:59 PM.  Always use your most recent med list.               atorvastatin 20 MG tablet  Commonly known as:  LIPITOR  Take 1 tablet (20 mg total) by mouth daily.  carvedilol 12.5 MG tablet  Commonly known as:  COREG  Take 6.25-12.5 mg by mouth 2 (two) times daily as needed (palpitations). Reported on 07/08/2015     multivitamin tablet  Take 1 tablet by mouth daily.     OMEGA 3-6-9 PO  Take 1 tablet by mouth daily.     VITAMIN B-12 PO  Take 5,000 mcg by mouth daily.     vitamin C 1000 MG tablet  Take 1,000 mg by mouth daily.           Objective:   Physical Exam BP 124/76 mmHg  Pulse 60  Temp(Src) 97.8 F (36.6 C) (Oral)  Ht 5\' 11"  (1.803 m)  Wt 213 lb 2 oz (96.673 kg)  BMI 29.74 kg/m2  SpO2 97% General:   Well developed, well nourished . NAD.  HEENT:  Normocephalic . Face symmetric, atraumatic Lungs:  CTA B Normal respiratory effort, no intercostal retractions, no accessory muscle use. Heart: RRR,  no murmur.  no pretibial edema bilaterally  Abdomen:  Not distended, soft, non-tender. No rebound or rigidity.  Skin: Not pale. Not  jaundice Rectal:  External abnormalities: none. Normal sphincter tone. No rectal masses or tenderness.  Stool brown  Prostate: Prostate gland firm and smooth, no enlargement, nodularity, tenderness, mass, asymmetry or induration.  Neurologic:  alert & oriented X3.  Speech normal, gait appropriate for age and unassisted Psych--  Cognition and judgment appear intact.  Cooperative with normal attention span and concentration.  Behavior appropriate. No anxious or depressed appearing.    Assessment & Plan:   Assessment> Hyperlipidemia Elevated BP Anxiety, insomnia PVCs -palpitations-- BB prn, sx usually related to anxiety Stress test 2005 negative  PLAN Hyperlipidemia: Check labs Elevated BP: Not an issue at this time RTC 1 year

## 2015-07-08 NOTE — Patient Instructions (Signed)
GO TO THE LAB :      Get the blood work     GO TO THE FRONT DESK Schedule your next appointment for a  Physical exam When?   1 year Fasting?  Yes       Check the  blood pressure   Monthly  Be sure your blood pressure is between 110/65 and  145/85. If it is consistently higher or lower, let me know

## 2015-07-08 NOTE — Progress Notes (Signed)
Pre visit review using our clinic review tool, if applicable. No additional management support is needed unless otherwise documented below in the visit note. 

## 2015-07-09 LAB — HIV ANTIBODY (ROUTINE TESTING W REFLEX): HIV 1&2 Ab, 4th Generation: NONREACTIVE

## 2015-07-09 NOTE — Assessment & Plan Note (Signed)
Hyperlipidemia: Check labs Elevated BP: Not an issue at this time RTC 1 year

## 2015-08-23 ENCOUNTER — Encounter: Payer: Self-pay | Admitting: Gastroenterology

## 2015-11-29 DIAGNOSIS — G43109 Migraine with aura, not intractable, without status migrainosus: Secondary | ICD-10-CM | POA: Diagnosis not present

## 2016-01-03 ENCOUNTER — Other Ambulatory Visit: Payer: Self-pay | Admitting: Internal Medicine

## 2016-04-07 DIAGNOSIS — Z23 Encounter for immunization: Secondary | ICD-10-CM | POA: Diagnosis not present

## 2016-07-02 ENCOUNTER — Other Ambulatory Visit: Payer: Self-pay | Admitting: Internal Medicine

## 2016-07-10 ENCOUNTER — Ambulatory Visit (INDEPENDENT_AMBULATORY_CARE_PROVIDER_SITE_OTHER): Payer: BLUE CROSS/BLUE SHIELD | Admitting: Internal Medicine

## 2016-07-10 ENCOUNTER — Encounter: Payer: Self-pay | Admitting: Internal Medicine

## 2016-07-10 VITALS — BP 118/78 | HR 66 | Temp 98.1°F | Resp 14 | Ht 71.0 in | Wt 225.4 lb

## 2016-07-10 DIAGNOSIS — Z0000 Encounter for general adult medical examination without abnormal findings: Secondary | ICD-10-CM | POA: Diagnosis not present

## 2016-07-10 DIAGNOSIS — Z1159 Encounter for screening for other viral diseases: Secondary | ICD-10-CM

## 2016-07-10 LAB — LIPID PANEL
CHOL/HDL RATIO: 4
Cholesterol: 209 mg/dL — ABNORMAL HIGH (ref 0–200)
HDL: 46.6 mg/dL (ref >39.00)
LDL CALC: 124 mg/dL — AB (ref 0–99)
NONHDL: 161.96
Triglycerides: 190 mg/dL — ABNORMAL HIGH (ref 0.0–149.0)
VLDL: 38 mg/dL (ref 0.0–40.0)

## 2016-07-10 LAB — BASIC METABOLIC PANEL
BUN: 20 mg/dL (ref 6–23)
CALCIUM: 9.7 mg/dL (ref 8.4–10.5)
CO2: 27 mEq/L (ref 19–32)
CREATININE: 1.08 mg/dL (ref 0.40–1.50)
Chloride: 107 mEq/L (ref 96–112)
GFR: 73.87 mL/min (ref >60.00)
Glucose, Bld: 102 mg/dL — ABNORMAL HIGH (ref 70–99)
Potassium: 4.4 mEq/L (ref 3.5–5.1)
Sodium: 140 mEq/L (ref 135–145)

## 2016-07-10 LAB — AST: AST: 23 U/L (ref 0–37)

## 2016-07-10 LAB — HEPATITIS C ANTIBODY: HCV Ab: NEGATIVE

## 2016-07-10 LAB — ALT: ALT: 41 U/L (ref 0–53)

## 2016-07-10 NOTE — Assessment & Plan Note (Signed)
Hyperlipidemia: Check labs PVCs: Asx for the last year. Back pain: No red flag symptoms, then occasional use of ibuprofen or Tylenol, rx  self physical therapy, stretching. Call if not better. Formal PT?. RTC one year

## 2016-07-10 NOTE — Assessment & Plan Note (Addendum)
--  Td 2017, had a flu shot ; s/p zostavax  --CCS:  Colonoscopy 09/2010, Dr. Ardis Hughs. GI letter printed, advise patient to call and schedule a colonoscopy --Prostate ca screening: DRE and PSA wnl 2017 --Exercise,diet discussed: Doing well, some room for improvement on diet, exercises 3 times a week on a stationary bike --Labs: BMP, AST, ALT, FLP, hep C RTC  1 year

## 2016-07-10 NOTE — Patient Instructions (Signed)
GO TO THE LAB : Get the blood work     GO TO THE FRONT DESK Schedule your next appointment for a  Physical in 1 year    [place] with information about home physical therapy for back pain: [email or web address]   Back Exercises If you have pain in your back, do these exercises 2-3 times each day or as told by your doctor. When the pain goes away, do the exercises once each day, but repeat the steps more times for each exercise (do more repetitions). If you do not have pain in your back, do these exercises once each day or as told by your doctor. Exercises Single Knee to Chest   Do these steps 3-5 times in a row for each leg: 1. Lie on your back on a firm bed or the floor with your legs stretched out. 2. Bring one knee to your chest. 3. Hold your knee to your chest by grabbing your knee or thigh. 4. Pull on your knee until you feel a gentle stretch in your lower back. 5. Keep doing the stretch for 10-30 seconds. 6. Slowly let go of your leg and straighten it.      Exercise J: Single leg lower with bent knees  1. Lie on your back on a firm surface. 2. Tense your abdominal muscles and lift your feet off the floor, one foot at a time, so your knees and hips are bent in an "L" shape (at about 90 degrees).  Your knees should be over your hips and your lower legs should be parallel to the floor. 3. Keeping your abdominal muscles tense and your knee bent, slowly lower one of your legs so your toe touches the ground. 4. Lift your leg back up to return to the starting position.  Do not hold your breath.  Do not let your back arch. Keep your back flat against the ground. 5. Repeat with your other leg. Repeat __________ times. Complete this exercise __________ times a day. Posture and body mechanics   Body mechanics refers to the movements and positions of your body while you do your daily activities.  Posture is part of body mechanics. Good posture and healthy body mechanics can help to relieve stress in your body's tissues and joints. Good posture means that your spine is in its natural S-curve position (your spine is neutral), your shoulders are pulled back slightly, and your head is not tipped forward. The following are general guidelines for applying improved posture and body mechanics to your everyday activities. Standing    When standing, keep your spine neutral and your feet about hip-width apart. Keep a slight bend in your knees. Your ears, shoulders, and hips should line up.  When you do a task in which you stand in one place for a long time, place one foot up on a stable object that is 2-4 inches (5-10 cm) high, such as a footstool. This helps keep your spine neutral. Sitting    When sitting, keep your spine neutral and keep your feet flat on the floor. Use a footrest, if necessary, and keep your thighs parallel to the floor. Avoid rounding your shoulders, and avoid tilting your head forward.  When working at a desk or a computer, keep your desk at a height where your hands are slightly lower than your elbows. Slide your chair under your desk so you are close enough to maintain good posture.  When working at a computer, place your monitor at a  height where you are looking straight ahead and you do not have to tilt your head forward or downward to look at the screen. Resting    When lying down and resting, avoid positions that are most painful for you.  If you have pain with activities such as sitting, bending, stooping, or squatting (flexion-based activities), lie in a position in which your body does not bend very much. For example, avoid curling up on your side with your arms and knees near your chest (fetal position).  If you have pain with activities such as standing for a long time or reaching with your arms (extension-based activities), lie with your spine in a neutral  position and bend your knees slightly. Try the following positions:  Lying on your side with a pillow between your knees.  Lying on your back with a pillow under your knees. Lifting    When lifting objects, keep your feet at least shoulder-width apart and tighten your abdominal muscles.  Bend your knees and hips and keep your spine neutral. It is important to lift using the strength of your legs, not your back. Do not lock your knees straight out.  Always ask for help to lift heavy or awkward objects. This information is not intended to replace advice given to you by your health care provider. Make sure you discuss any questions you have with your health care provider. Document Released: 04/09/2005 Document Revised: 12/15/2015 Document Reviewed: 01/19/2015 Elsevier Interactive Patient Education  2017 [employer].

## 2016-07-10 NOTE — Progress Notes (Signed)
Pre visit review using our clinic review tool, if applicable. No additional management support is needed unless otherwise documented below in the visit note. 

## 2016-07-10 NOTE — Progress Notes (Signed)
Subjective:    Patient ID: Shane Wood, male    DOB: Aug 27, 1955, 61 y.o.   MRN: [id number]  DOS:  07/10/2016 Type of visit - description : cpx Interval history: No major concerns    Review of Systems  most morning wakes up w/ back pain, mid-low area, last few hours and goes away, no radiation, no f/c, no severe sx, no paresthesias   Other than above, a 14 point review of systems is negative     Past Medical History:  Diagnosis Date  . Anxiety state, unspecified   . BACK PAIN   . ELEVATED BP READING WITHOUT DX HYPERTENSION 05/03/2008   Qualifier: Diagnosis of  By: Larose Kells MD, [place] H/O cardiovascular stress test 2005    (-)  . Hyperlipidemia   . INSOMNIA-SLEEP DISORDER-UNSPEC   . PVC (premature ventricular contraction)     Past Surgical History:  Procedure Laterality Date  . TONSILLECTOMY     age 89 y/o    Social History   Social History  . Marital status: Married    Spouse name: N/A  . Number of children: 2  . Years of education: N/A   Occupational History  . finances , used to work for [employer]  . Smoking status: Former Smoker    Quit date: 09/24/1988  . Smokeless tobacco: Never Used  . Alcohol use 2.4 oz/week    4 Glasses of wine per week  . Drug use: No  . Sexual activity: Not on file   Other Topics Concern  . Not on file   Social History Narrative   Household pt and wife    2 adult married children     Family History  Problem Relation Age of Onset  . Lung cancer Father     smoker  . Diabetes Maternal Grandmother   . Diabetes Maternal Grandfather   . Hypertension Mother   . Heart attack Other     uncle MI at age 70s  . Stroke Other     GM, in her 40  . Colon cancer Neg Hx   . Prostate cancer Neg Hx      Allergies as of 07/10/2016   No Known Allergies     Medication List       Accurate as of 07/10/16  6:12 PM. Always use your most recent med list.          atorvastatin 20 MG tablet Commonly known  as:  LIPITOR Take 1 tablet (20 mg total) by mouth daily.   carvedilol 12.5 MG tablet Commonly known as:  COREG Take 6.25-12.5 mg by mouth 2 (two) times daily as needed (palpitations). Reported on 07/08/2015   multivitamin tablet Take 1 tablet by mouth daily.   OMEGA 3-6-9 PO Take 1 tablet by mouth daily.   VITAMIN B-12 PO Take 5,000 mcg by mouth daily.   vitamin C 1000 MG tablet Take 1,000 mg by mouth daily.          Objective:   Physical Exam BP 118/78 (BP Location: Left Arm, Patient Position: Sitting, Cuff Size: Normal)   Pulse 66   Temp 98.1 F (36.7 C) (Oral)   Resp 14   Ht 5\' 11"  (1.803 m)   Wt 225 lb 6 oz (102.2 kg)   SpO2 97%   BMI 31.43 kg/m  General:   Well developed, well nourished . NAD.  Neck: No  thyromegaly  HEENT:  Normocephalic .  Face symmetric, atraumatic Lungs:  CTA B Normal respiratory effort, no intercostal retractions, no accessory muscle use. Heart: RRR,  no murmur.  No pretibial edema bilaterally  Abdomen:  Not distended, soft, non-tender. No rebound or rigidity.   Skin: Exposed areas without rash. Not pale. Not jaundice Neurologic:  alert & oriented X3.  Speech normal, gait appropriate for age and unassisted Strength symmetric and appropriate for age. DTRs  symmetric. Psych: Cognition and judgment appear intact.  Cooperative with normal attention span and concentration.  Behavior appropriate. No anxious or depressed appearing.     Assessment & Plan:   Assessment  Hyperlipidemia Elevated BP Anxiety, insomnia PVCs -palpitations-- BB prn, sx usually related to anxiety Stress test 2005 negative  PLAN Hyperlipidemia: Check labs PVCs: Asx for the last year. Back pain: No red flag symptoms, then occasional use of ibuprofen or Tylenol, rx  self physical therapy, stretching. Call if not better. Formal PT?. RTC one year

## 2016-09-29 ENCOUNTER — Other Ambulatory Visit: Payer: Self-pay | Admitting: Internal Medicine

## 2016-10-07 ENCOUNTER — Other Ambulatory Visit: Payer: Self-pay | Admitting: Internal Medicine

## 2016-10-10 ENCOUNTER — Encounter: Payer: Self-pay | Admitting: Gastroenterology

## 2016-12-05 ENCOUNTER — Ambulatory Visit (AMBULATORY_SURGERY_CENTER): Payer: Self-pay | Admitting: *Deleted

## 2016-12-05 ENCOUNTER — Encounter: Payer: Self-pay | Admitting: Gastroenterology

## 2016-12-05 VITALS — Ht 71.0 in | Wt 219.2 lb

## 2016-12-05 DIAGNOSIS — Z86010 Personal history of colonic polyps: Secondary | ICD-10-CM

## 2016-12-05 MED ORDER — NA SULFATE-K SULFATE-MG SULF 17.5-3.13-1.6 GM/177ML PO SOLN: 1.0000 [IU] | Freq: Once | ORAL | 0 refills | Status: AC

## 2016-12-05 NOTE — Progress Notes (Signed)
No egg or soy allergy known to patient  No issues with past sedation with any surgeries  or procedures, no intubation problems  No diet pills per patient No home 02 use per patient  No blood thinners per patient  Pt denies issues with constipation  No A fib or A flutter   (PVCs) EMMI video sent to pt's e mail

## 2016-12-19 ENCOUNTER — Encounter: Payer: Self-pay | Admitting: Gastroenterology

## 2016-12-19 ENCOUNTER — Ambulatory Visit (AMBULATORY_SURGERY_CENTER): Payer: BLUE CROSS/BLUE SHIELD | Admitting: Gastroenterology

## 2016-12-19 VITALS — BP 129/74 | HR 63 | Temp 97.3°F | Resp 18 | Wt 219.0 lb

## 2016-12-19 DIAGNOSIS — Z86010 Personal history of colonic polyps: Secondary | ICD-10-CM | POA: Diagnosis not present

## 2016-12-19 DIAGNOSIS — K635 Polyp of colon: Secondary | ICD-10-CM | POA: Diagnosis not present

## 2016-12-19 DIAGNOSIS — D125 Benign neoplasm of sigmoid colon: Secondary | ICD-10-CM

## 2016-12-19 MED ORDER — SODIUM CHLORIDE 0.9 % IV SOLN: 500.0000 mL | INTRAVENOUS | Status: DC

## 2016-12-19 NOTE — Patient Instructions (Signed)
Discharge instructions given. Handout on polyps. Resume previous medications. YOU HAD AN ENDOSCOPIC PROCEDURE TODAY AT THE [facility]:   Refer to the procedure report that was given to you for any specific questions about what was found during the examination.  If the procedure report does not answer your questions, please call your gastroenterologist to clarify.  If you requested that your care partner not be given the details of your procedure findings, then the procedure report has been included in a sealed envelope for you to review at your convenience later.  YOU SHOULD EXPECT: Some feelings of bloating in the abdomen. Passage of more gas than usual.  Walking can help get rid of the air that was put into your GI tract during the procedure and reduce the bloating. If you had a lower endoscopy (such as a colonoscopy or flexible sigmoidoscopy) you may notice spotting of blood in your stool or on the toilet paper. If you underwent a bowel prep for your procedure, you may not have a normal bowel movement for a few days.  Please Note:  You might notice some irritation and congestion in your nose or some drainage.  This is from the oxygen used during your procedure.  There is no need for concern and it should clear up in a day or so.  SYMPTOMS TO REPORT IMMEDIATELY:   Following lower endoscopy (colonoscopy or flexible sigmoidoscopy):  Excessive amounts of blood in the stool  Significant tenderness or worsening of abdominal pains  Swelling of the abdomen that is new, acute  Fever of 100F or higher   For urgent or emergent issues, a gastroenterologist can be reached at any hour by calling [phone].   DIET:  We do recommend a small meal at first, but then you may proceed to your regular diet.  Drink plenty of fluids but you should avoid alcoholic beverages for 24 hours.  ACTIVITY:  You should plan to take it easy for the rest of today and you should NOT DRIVE or use heavy  machinery until tomorrow (because of the sedation medicines used during the test).    FOLLOW UP: Our staff will call the number listed on your records the next business day following your procedure to check on you and address any questions or concerns that you may have regarding the information given to you following your procedure. If we do not reach you, we will leave a message.  However, if you are feeling well and you are not experiencing any problems, there is no need to return our call.  We will assume that you have returned to your regular daily activities without incident.  If any biopsies were taken you will be contacted by phone or by letter within the next 1-3 weeks.  Please call us at [phone] if you have not heard about the biopsies in 3 weeks.    SIGNATURES/CONFIDENTIALITY: You and/or your care partner have signed paperwork which will be entered into your electronic medical record.  These signatures attest to the fact that that the information above on your After Visit Summary has been reviewed and is understood.  Full responsibility of the confidentiality of this discharge information lies with you and/or your care-partner. 

## 2016-12-19 NOTE — Op Note (Signed)
[facility] Patient Name: Shane Wood Procedure Date: 12/19/2016 2:41 PM MRN: [id number] Endoscopist: Milus Banister , MD Age: 61 Referring MD:  Date of Birth: 09-30-1955 Gender: Male Account #: [id number] Procedure:                Colonoscopy Indications:              High risk colon cancer surveillance: Personal                            history of colonic polyps; 2012 colonoscopy found                            three subCM polyps (two were adenomas) Medicines:                Monitored Anesthesia Care Procedure:                Pre-Anesthesia Assessment:                           - Prior to the procedure, a History and Physical                            was performed, and patient medications and                            allergies were reviewed. The patient's tolerance of                            previous anesthesia was also reviewed. The risks                            and benefits of the procedure and the sedation                            options and risks were discussed with the patient.                            All questions were answered, and informed consent                            was obtained. Prior Anticoagulants: The patient has                            taken no previous anticoagulant or antiplatelet                            agents. ASA Grade Assessment: II - A patient with                            mild systemic disease. After reviewing the risks                            and benefits, the patient was deemed in  satisfactory condition to undergo the procedure.                           After obtaining informed consent, the colonoscope                            was passed under direct vision. Throughout the                            procedure, the patient's blood pressure, pulse, and                            oxygen saturations were monitored continuously. The                            Colonoscope was introduced  through the anus and                            advanced to the the cecum, identified by                            appendiceal orifice and ileocecal valve. The                            colonoscopy was performed without difficulty. The                            patient tolerated the procedure well. The quality                            of the bowel preparation was good. The ileocecal                            valve, appendiceal orifice, and rectum were                            photographed. Scope In: 2:48:14 PM Scope Out: 2:59:20 PM Scope Withdrawal Time: 0 hours 8 minutes 15 seconds  Total Procedure Duration: 0 hours 11 minutes 6 seconds  Findings:                 A 3 mm polyp was found in the sigmoid colon. The                            polyp was sessile. The polyp was removed with a                            cold snare. Resection and retrieval were complete.                           The exam was otherwise without abnormality on                            direct and retroflexion views. Complications:  No immediate complications. Estimated blood loss:                            None. Estimated Blood Loss:     Estimated blood loss: none. Impression:               - One 3 mm polyp in the sigmoid colon, removed with                            a cold snare. Resected and retrieved.                           - The examination was otherwise normal on direct                            and retroflexion views. Recommendation:           - Patient has a contact number available for                            emergencies. The signs and symptoms of potential                            delayed complications were discussed with the                            patient. Return to normal activities tomorrow.                            Written discharge instructions were provided to the                            patient.                           - Resume previous diet.                            - Continue present medications.                           You will receive a letter within 2-3 weeks with the                            pathology results and my final recommendations.                           If the polyp(s) is proven to be 'pre-cancerous' on                            pathology, you will need repeat colonoscopy in 5                            years. If the polyp(s) is NOT 'precancerous' on  pathology then you should repeat colon cancer                            screening in 10 years with colonoscopy without need                            for colon cancer screening by any method prior to                            then (including stool testing). Milus Banister, MD 12/19/2016 3:02:10 PM This report has been signed electronically.

## 2016-12-19 NOTE — Progress Notes (Signed)
Pt's states no medical or surgical changes since previsit or office visit. maw 

## 2016-12-19 NOTE — Progress Notes (Signed)
Called to room to assist during endoscopic procedure.  Patient ID and intended procedure confirmed with present staff. Received instructions for my participation in the procedure from the performing physician.  

## 2016-12-19 NOTE — Progress Notes (Signed)
Report to PACU, RN, vss, BBS= Clear.  

## 2016-12-20 ENCOUNTER — Telehealth: Payer: Self-pay | Admitting: *Deleted

## 2016-12-20 NOTE — Telephone Encounter (Signed)
  Follow up Call-  Call back number 12/19/2016  Post procedure Call Back phone  # [phone] cell  Permission to leave phone message Yes  Some recent data might be hidden     Patient questions:  Do you have a fever, pain , or abdominal swelling? No. Pain Score  0 *  Have you tolerated food without any problems? Yes.    Have you been able to return to your normal activities? Yes.    Do you have any questions about your discharge instructions: Diet   No. Medications  No. Follow up visit  No.  Do you have questions or concerns about your Care? No.  Actions: * If pain score is 4 or above: No action needed, pain <4.

## 2016-12-30 ENCOUNTER — Encounter: Payer: Self-pay | Admitting: Gastroenterology

## 2017-03-06 ENCOUNTER — Ambulatory Visit: Payer: BLUE CROSS/BLUE SHIELD | Admitting: Internal Medicine

## 2017-03-06 ENCOUNTER — Encounter: Payer: Self-pay | Admitting: Internal Medicine

## 2017-03-06 ENCOUNTER — Ambulatory Visit (HOSPITAL_BASED_OUTPATIENT_CLINIC_OR_DEPARTMENT_OTHER)
Admission: RE | Admit: 2017-03-06 | Discharge: 2017-03-06 | Disposition: A | Discharge status: Home / Self Care | Payer: BLUE CROSS/BLUE SHIELD | Source: Ambulatory Visit | Attending: Internal Medicine | Admitting: Internal Medicine

## 2017-03-06 VITALS — BP 142/78 | HR 58 | Temp 97.6°F | Resp 14 | Ht 71.0 in | Wt 218.5 lb

## 2017-03-06 DIAGNOSIS — R079 Chest pain, unspecified: Secondary | ICD-10-CM | POA: Insufficient documentation

## 2017-03-06 DIAGNOSIS — K219 Gastro-esophageal reflux disease without esophagitis: Secondary | ICD-10-CM | POA: Diagnosis not present

## 2017-03-06 DIAGNOSIS — L989 Disorder of the skin and subcutaneous tissue, unspecified: Secondary | ICD-10-CM | POA: Diagnosis not present

## 2017-03-06 DIAGNOSIS — R0789 Other chest pain: Secondary | ICD-10-CM | POA: Diagnosis not present

## 2017-03-06 IMAGING — DX DG CHEST 2V
2 series · 2 of 2 positions shown · non-contrast
Comparison: Chest x-ray of [DATE]

CLINICAL DATA: Left lower anterior chest wall pain for the past 3
days. History of hypertension, gastroesophageal reflux, former
smoker.

EXAM:
CHEST  2 VIEW

[chest pa]
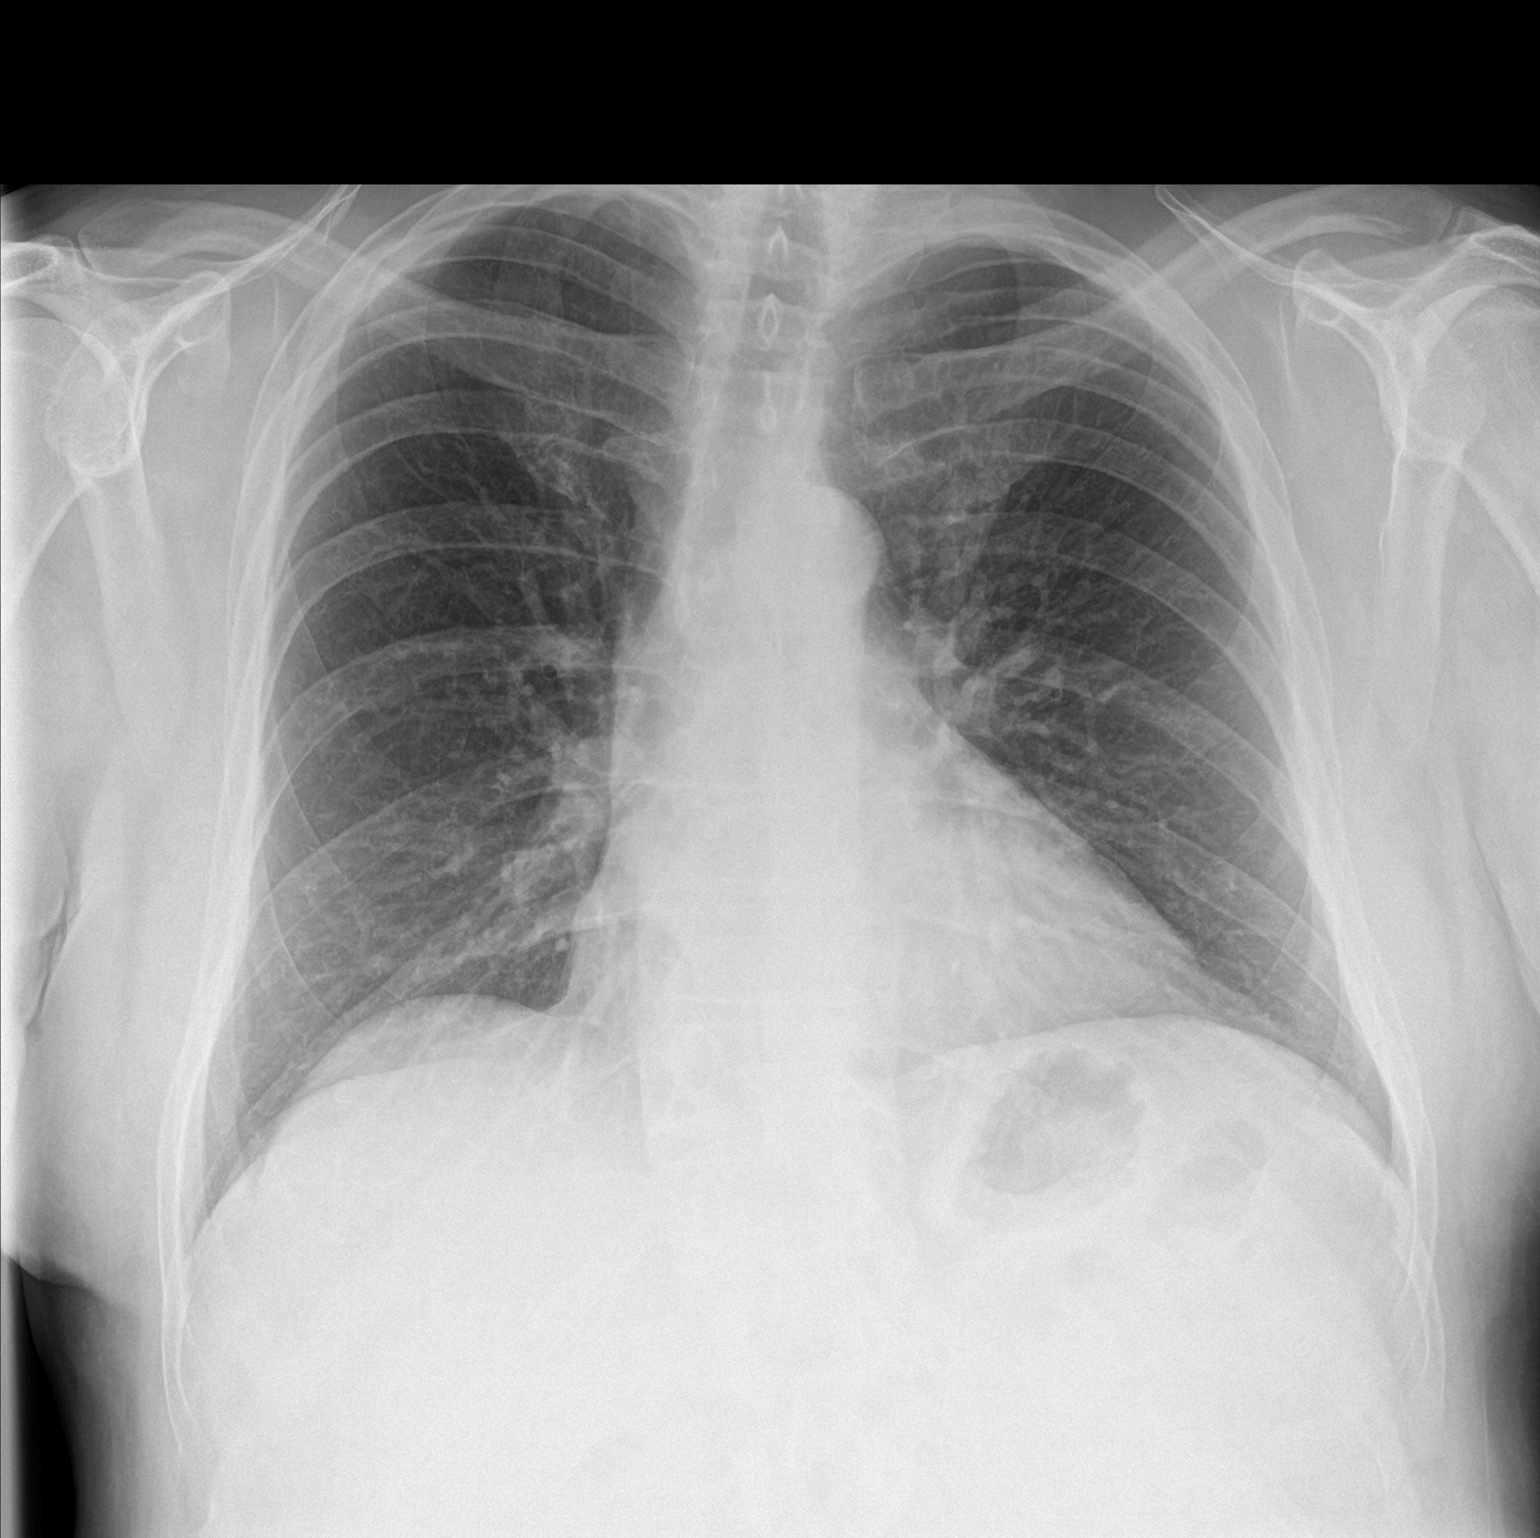

[chest lat]
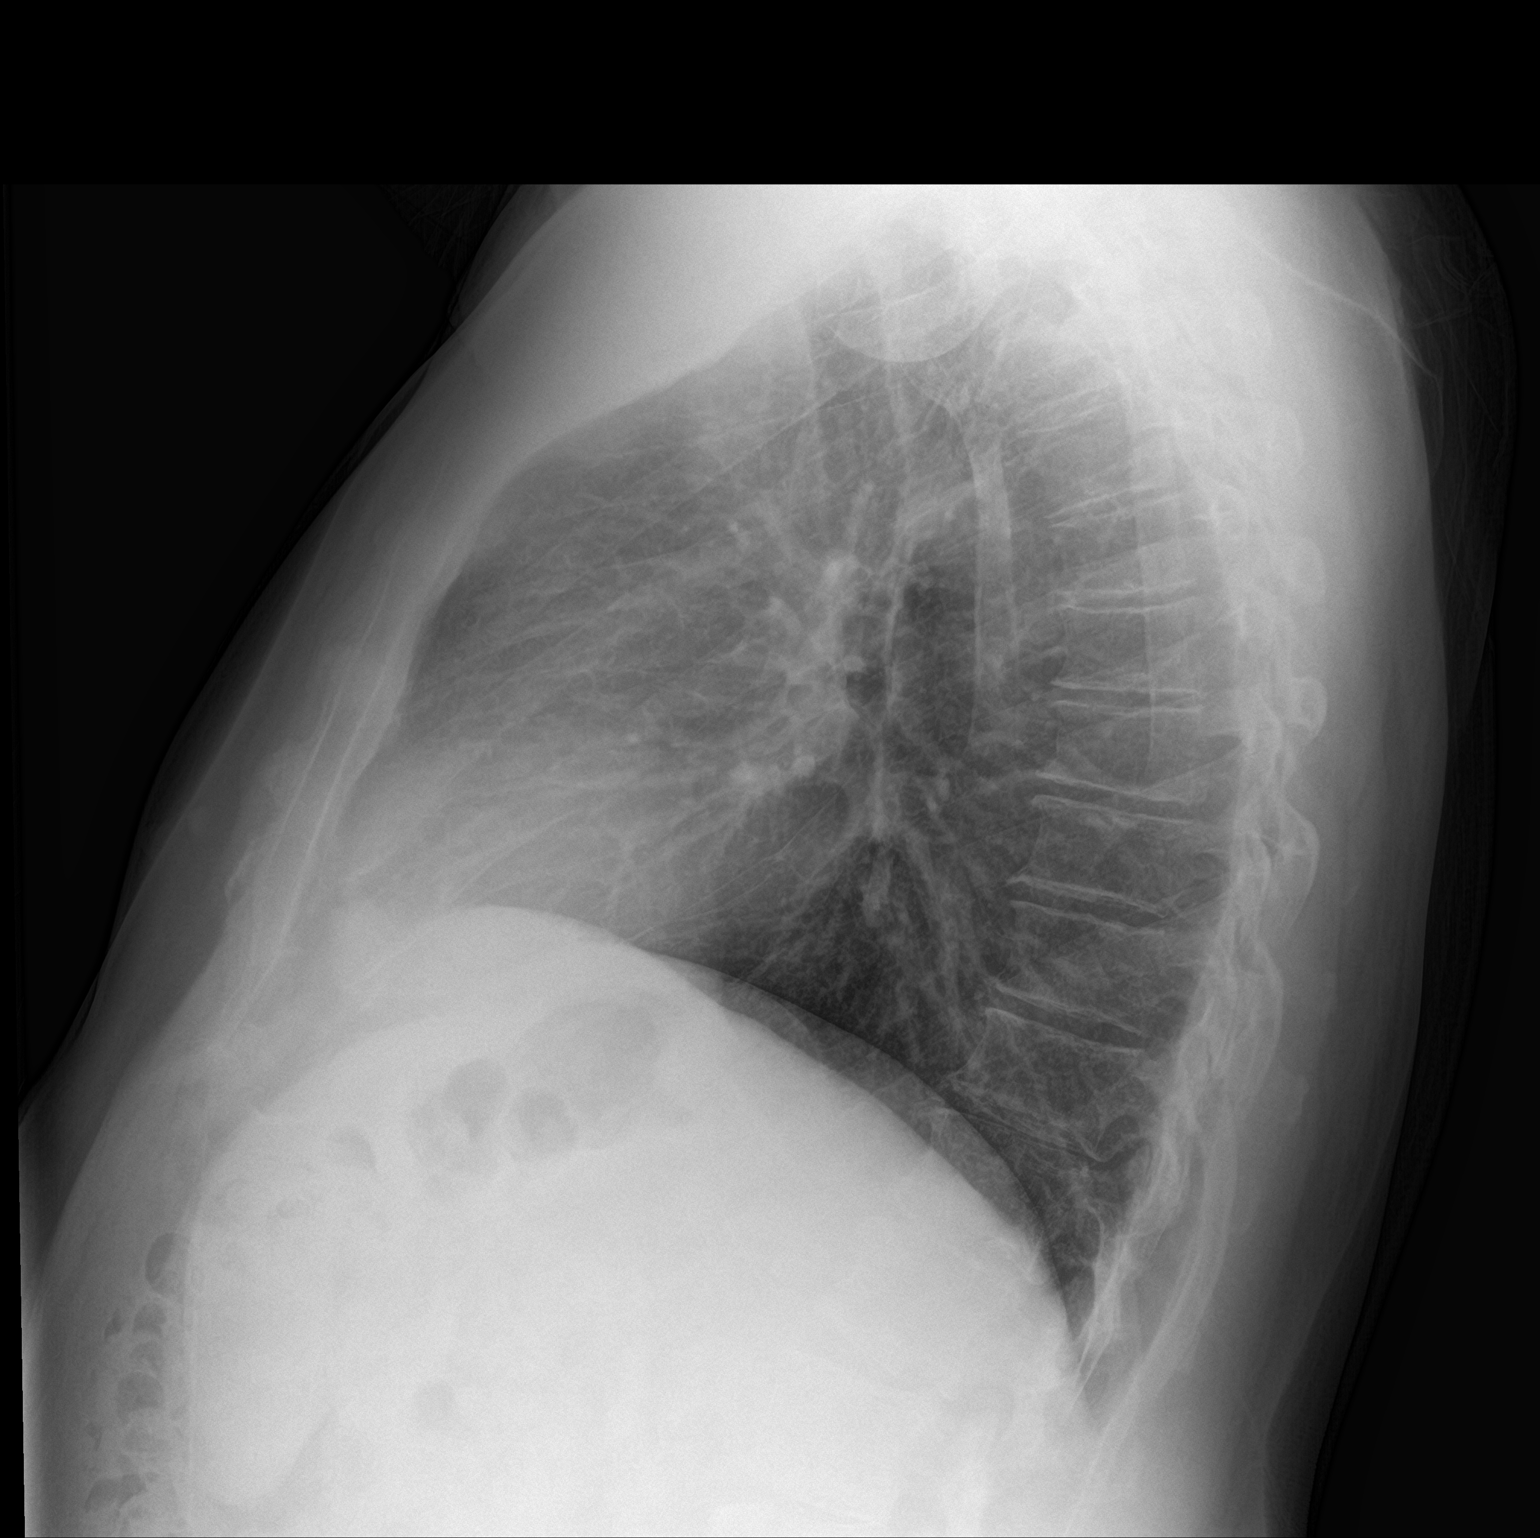

[2 of 2 positions shown; findings below may reference images not displayed]

FINDINGS: The lungs are well-expanded and clear. The heart and pulmonary
vascularity are normal. The mediastinum is normal in width. The
trachea is midline. The bony thorax exhibits no acute abnormality.
IMPRESSION: There is no active cardiopulmonary disease.

## 2017-03-06 MED ORDER — RANITIDINE HCL 300 MG PO TABS: 300.0000 mg | ORAL_TABLET | Freq: Every day | ORAL | 3 refills | Status: DC

## 2017-03-06 NOTE — Progress Notes (Signed)
Pre visit review using our clinic review tool, if applicable. No additional management support is needed unless otherwise documented below in the visit note. 

## 2017-03-06 NOTE — Patient Instructions (Signed)
STOP BY THE FIRST FLOOR:  get the XR    Prevacid in the morning on a empty stomach x 6 weeks  Then Zantac 300 mg every night  Ok to go back on prevacid x 6 weeks as needed   Next visit 06-2017, physical exam    Food Choices for Gastroesophageal Reflux Disease, Adult When you have gastroesophageal reflux disease (GERD), the foods you eat and your eating habits are very important. Choosing the right foods can help ease the discomfort of GERD. Consider working with a diet and nutrition specialist (dietitian) to help you make healthy food choices. What general guidelines should I follow? Eating plan  Choose healthy foods low in fat, such as fruits, vegetables, whole grains, low-fat dairy products, and lean meat, fish, and poultry.  Eat frequent, small meals instead of three large meals each day. Eat your meals slowly, in a relaxed setting. Avoid bending over or lying down until 2-3 hours after eating.  Limit high-fat foods such as fatty meats or fried foods.  Limit your intake of oils, butter, and shortening to less than 8 teaspoons each day.  Avoid the following: ? Foods that cause symptoms. These may be different for different people. Keep a food diary to keep track of foods that cause symptoms. ? Alcohol. ? Drinking large amounts of liquid with meals. ? Eating meals during the 2-3 hours before bed.  Cook foods using methods other than frying. This may include baking, grilling, or broiling. Lifestyle   Maintain a healthy weight. Ask your health care provider what weight is healthy for you. If you need to lose weight, work with your health care provider to do so safely.  Exercise for at least 30 minutes on 5 or more days each week, or as told by your health care provider.  Avoid wearing clothes that fit tightly around your waist and chest.  Do not use any products that contain nicotine or tobacco, such as cigarettes and e-cigarettes. If you need help quitting, ask your health  care provider.  Sleep with the head of your bed raised. Use a wedge under the mattress or blocks under the bed frame to raise the head of the bed. What foods are not recommended? The items listed may not be a complete list. Talk with your dietitian about what dietary choices are best for you. Grains Pastries or quick breads with added fat. [place] toast. Vegetables Deep fried vegetables. [place] fries. Any vegetables prepared with added fat. Any vegetables that cause symptoms. For some people this may include tomatoes and tomato products, chili peppers, onions and garlic, and horseradish. Fruits Any fruits prepared with added fat. Any fruits that cause symptoms. For some people this may include citrus fruits, such as oranges, grapefruit, pineapple, and lemons. Meats and other protein foods High-fat meats, such as fatty beef or pork, hot dogs, ribs, ham, sausage, salami and bacon. Fried meat or protein, including fried fish and fried chicken. Nuts and nut butters. Dairy Whole milk and chocolate milk. Sour cream. Cream. Ice cream. Cream cheese. Milk shakes. Beverages Coffee and tea, with or without caffeine. Carbonated beverages. Sodas. Energy drinks. Fruit juice made with acidic fruits (such as orange or grapefruit). Tomato juice. Alcoholic drinks. Fats and oils Butter. Margarine. Shortening. Ghee. Sweets and desserts Chocolate and cocoa. Donuts. Seasoning and other foods Pepper. Peppermint and spearmint. Any condiments, herbs, or seasonings that cause symptoms. For some people, this may include curry, hot sauce, or vinegar-based salad dressings. Summary  When you have  gastroesophageal reflux disease (GERD), food and lifestyle choices are very important to help ease the discomfort of GERD.  Eat frequent, small meals instead of three large meals each day. Eat your meals slowly, in a relaxed setting. Avoid bending over or lying down until 2-3 hours after eating.  Limit high-fat foods such as  fatty meat or fried foods. This information is not intended to replace advice given to you by your health care provider. Make sure you discuss any questions you have with your health care provider. Document Released: 04/09/2005 Document Revised: 04/10/2016 Document Reviewed: 04/10/2016 Elsevier Interactive Patient Education  2017 [employer].

## 2017-03-06 NOTE — Assessment & Plan Note (Signed)
PLAN Chest pain: Distal anterior left chest pain, likely MSK.  He did have 2 recent airplane trips, ddx includes PE, but very low suspicion as he has no associated sx and is actually feeling better.  We agreed not to explore that DX unless he has continue or severe SX . If that is the case we will do a BMP and d-dimer.  Also call if fever, chills, cough.  Otherwise we will get a chest x-ray, Tylenol, ibuprofen as needed. GERD: Chronic on and off, slightly worse lately.  Precautions discussed.  He is somewhat hesitant to take chronic PPIs.  Recommend Prevacid daily times 6 weeks, then switch to Zantac 300 mg at night.  Okay to go back to PPIs sporadically. Skin lesions: Recommend self monitoring (wife) monthly, call his dermatologist for any red flag symptoms. RTC 3-19 CPX

## 2017-03-06 NOTE — Progress Notes (Signed)
Subjective:    Patient ID: Shane Wood, male    DOB: 1955/05/10, 61 y.o.   MRN: [id number]  DOS:  03/06/2017 Type of visit - description : acute Interval history: Multiple concerns 4 days ago developed sharp, on and off pain at the distal left anterior chest.  The pain was triggered by certain movements and the area was somewhat tender to palpation.  Denies any injury or rash.  Symptoms have actually been decreasing since. Long history of heartburn, on and off for years, has taken Prevacid daily with good results but is somewhat concerned about taking PPIs. Has moles, multiple on his back.  Wife is concerned about them  Review of Systems  No fever chills No difficulty breathing, lower extremity edema palpitations No dysphasia or odynophagia. Has mild bloating, upper abdomen, on and off. No change in the color of the stools or weight loss No cough or sputum production Had 2 recent airplane trips:  September and the last flight was February 15, 2017 from [place]. Denies any lower extremity edema or calf pain.  Past Medical History:  Diagnosis Date  . Allergy   . BACK PAIN   . ELEVATED BP READING WITHOUT DX HYPERTENSION 05/03/2008   Qualifier: Diagnosis of  By: Larose Kells MD, [place] GERD (gastroesophageal reflux disease)   . H/O cardiovascular stress test 2005    (-)  . Hyperlipidemia   . INSOMNIA-SLEEP DISORDER-UNSPEC   . PVC (premature ventricular contraction)     Past Surgical History:  Procedure Laterality Date  . CHOLECYSTECTOMY    . POLYPECTOMY    . TONSILLECTOMY     age 35 y/o    Social History   Socioeconomic History  . Marital status: Married    Spouse name: Not on file  . Number of children: 2  . Years of education: Not on file  . Highest education level: Not on file  Social Needs  . Financial resource strain: Not on file  . Food insecurity - worry: Not on file  . Food insecurity - inability: Not on file  . Transportation needs - medical: Not on file    . Transportation needs - non-medical: Not on file  Occupational History  . Occupation: finances , used to work for [employer]  . Smoking status: Former Smoker    Last attempt to quit: 09/24/1988    Years since quitting: 28.4  . Smokeless tobacco: Former Systems developer    Types: Chew    Quit date: 1970  Substance and Sexual Activity  . Alcohol use: Yes    Alcohol/week: 2.4 oz    Types: 4 Glasses of wine per week    Comment: occasionally on weekends  . Drug use: No  . Sexual activity: Not on file  Other Topics Concern  . Not on file  Social History Narrative   Household pt and wife    2 adult married children      Allergies as of 03/06/2017   No Known Allergies     Medication List        Accurate as of 03/06/17  5:26 PM. Always use your most recent med list.          atorvastatin 20 MG tablet Commonly known as:  LIPITOR Take 1 tablet (20 mg total) by mouth daily.   lansoprazole 15 MG capsule Commonly known as:  PREVACID Take 15 mg by mouth daily at 12 noon.   multivitamin tablet Take 1 tablet by mouth  daily.   ranitidine 300 MG tablet Commonly known as:  ZANTAC Take 1 tablet (300 mg total) at bedtime by mouth.   vitamin C 1000 MG tablet Take 1,000 mg by mouth daily.          Objective:   Physical Exam  Abdominal:     BP (!) 142/78 (BP Location: Left Arm, Patient Position: Sitting, Cuff Size: Small)   Pulse (!) 58   Temp 97.6 F (36.4 C) (Oral)   Resp 14   Ht 5\' 11"  (1.803 m)   Wt 218 lb 8 oz (99.1 kg)   SpO2 98%   BMI 30.47 kg/m  General:   Well developed, well nourished . NAD.  HEENT:  Normocephalic . Face symmetric, atraumatic Lungs:  CTA B Normal respiratory effort, no intercostal retractions, no accessory muscle use. Heart: RRR,  no murmur.  no pretibial edema bilaterally.  Calves symmetric and not tender Abdomen:  Not distended, soft, non-tender. No rebound or rigidity.   Skin: Not pale. Not jaundice.  Multiple skin lesions in the  back, he had at least 3 that look like SKs Neurologic:  alert & oriented X3.  Speech normal, gait appropriate for age and unassisted Psych--  Cognition and judgment appear intact.  Cooperative with normal attention span and concentration.  Behavior appropriate. No anxious or depressed appearing.     Assessment & Plan:  Assessment  GERD Hyperlipidemia Elevated BP Anxiety, insomnia PVCs -palpitations-- BB prn, sx usually related to anxiety Stress test 2005 negative  PLAN Chest pain: Distal anterior left chest pain, likely MSK.  He did have 2 recent airplane trips, ddx includes PE, but very low suspicion as he has no associated sx and is actually feeling better.  We agreed not to explore that DX unless he has continue or severe SX . If that is the case we will do a BMP and d-dimer.  Also call if fever, chills, cough.  Otherwise we will get a chest x-ray, Tylenol, ibuprofen as needed. GERD: Chronic on and off, slightly worse lately.  Precautions discussed.  He is somewhat hesitant to take chronic PPIs.  Recommend Prevacid daily times 6 weeks, then switch to Zantac 300 mg at night.  Okay to go back to PPIs sporadically. Skin lesions: Recommend self monitoring (wife) monthly, call his dermatologist for any red flag symptoms. RTC 3-19 CPX

## 2017-07-15 ENCOUNTER — Ambulatory Visit (INDEPENDENT_AMBULATORY_CARE_PROVIDER_SITE_OTHER): Payer: BLUE CROSS/BLUE SHIELD | Admitting: Internal Medicine

## 2017-07-15 ENCOUNTER — Encounter: Payer: Self-pay | Admitting: Internal Medicine

## 2017-07-15 VITALS — BP 126/80 | HR 69 | Temp 97.9°F | Resp 14 | Ht 71.0 in | Wt 226.0 lb

## 2017-07-15 DIAGNOSIS — H5712 Ocular pain, left eye: Secondary | ICD-10-CM

## 2017-07-15 DIAGNOSIS — Z0000 Encounter for general adult medical examination without abnormal findings: Secondary | ICD-10-CM

## 2017-07-15 LAB — COMPREHENSIVE METABOLIC PANEL
ALT: 21 U/L (ref 0–53)
AST: 15 U/L (ref 0–37)
Albumin: 3.9 g/dL (ref 3.5–5.2)
Alkaline Phosphatase: 51 U/L (ref 39–117)
BUN: 25 mg/dL — AB (ref 6–23)
CO2: 26 meq/L (ref 19–32)
CREATININE: 0.94 mg/dL (ref 0.40–1.50)
Calcium: 9.1 mg/dL (ref 8.4–10.5)
Chloride: 105 mEq/L (ref 96–112)
GFR: 86.42 mL/min (ref >60.00)
GLUCOSE: 100 mg/dL — AB (ref 70–99)
Potassium: 4.2 mEq/L (ref 3.5–5.1)
SODIUM: 140 meq/L (ref 135–145)
Total Bilirubin: 0.4 mg/dL (ref 0.2–1.2)
Total Protein: 7.2 g/dL (ref 6.0–8.3)

## 2017-07-15 LAB — LDL CHOLESTEROL, DIRECT: LDL DIRECT: 105 mg/dL

## 2017-07-15 LAB — TSH: TSH: 2.14 u[IU]/mL (ref 0.35–4.50)

## 2017-07-15 LAB — CBC WITH DIFFERENTIAL/PLATELET
BASOS PCT: 0.3 % (ref 0.0–3.0)
Basophils Absolute: 0 10*3/uL (ref 0.0–0.1)
EOS ABS: 0.2 10*3/uL (ref 0.0–0.7)
Eosinophils Relative: 2.6 % (ref 0.0–5.0)
HCT: 42.8 % (ref 39.0–52.0)
Hemoglobin: 14.6 g/dL (ref 13.0–17.0)
Lymphocytes Relative: 28.1 % (ref 12.0–46.0)
Lymphs Abs: 1.7 10*3/uL (ref 0.7–4.0)
MCHC: 34 g/dL (ref 30.0–36.0)
MCV: 84.2 fl (ref 78.0–100.0)
Monocytes Absolute: 0.4 10*3/uL (ref 0.1–1.0)
Monocytes Relative: 7 % (ref 3.0–12.0)
NEUTROS ABS: 3.7 10*3/uL (ref 1.4–7.7)
Neutrophils Relative %: 62 % (ref 43.0–77.0)
PLATELETS: 222 10*3/uL (ref 150.0–400.0)
RBC: 5.08 Mil/uL (ref 4.22–5.81)
RDW: 14.5 % (ref 11.5–15.5)
WBC: 6 10*3/uL (ref 4.0–10.5)

## 2017-07-15 LAB — LIPID PANEL
Cholesterol: 170 mg/dL (ref 0–200)
HDL: 39.8 mg/dL (ref >39.00)
NONHDL: 130.25
Total CHOL/HDL Ratio: 4
Triglycerides: 205 mg/dL — ABNORMAL HIGH (ref 0.0–149.0)
VLDL: 41 mg/dL — ABNORMAL HIGH (ref 0.0–40.0)

## 2017-07-15 LAB — VITAMIN B12: Vitamin B-12: 278 pg/mL (ref 211–911)

## 2017-07-15 NOTE — Assessment & Plan Note (Addendum)
--  Td 2017,  s/p zostavax  --CCS:  Colonoscopy 09/2010, cscope again 11-2016 Dr. Ardis Hughs. Next per GI --Prostate ca screening: DRE and PSA wnl 2017 --Exercise,diet discussed: +exercises 3 times a week on a stationary bike.  Having problems losing weight, weight loss clinic number provided. --Labs: CMP, FLP, CBC, TSH, B12.

## 2017-07-15 NOTE — Assessment & Plan Note (Signed)
Hyperlipidemia, on Lipitor, checking labs Elevated BP: BP normal GERD: Going on for many years, ranitidine did not help.  On chronic PPIs with good control of sx, I reinforced the need of precautions. Ok PPIs long term for now, pro/cons discussed  Mild tinnitus: As above, red flag symptoms discussed such as severe or unilateral sxs w/ HOH Eye pain, left-sided, mild, unclear etiology, refer to ophthalmology. Moles: He has a variety of moles including SKs in the back, warning symptoms discussed, see a dermatologist is a very good option. RTC 1 year

## 2017-07-15 NOTE — Progress Notes (Signed)
Subjective:    Patient ID: Shane Wood, male    DOB: January 15, 1956, 62 y.o.   MRN: [id number]  DOS:  07/15/2017 Type of visit - description : CPX Interval history: In general doing well   Review of Systems If he is active and trying to eat relatively healthy yet unable to lose weight Occasionally has tinnitus, mild, bilateral, described as a buzzing, not associated with HOH. Occasionally has pain or discomfort behind the left eye, mild, saw the optometrist, they question if pain is a migraine.  No vision problems per se.  No pain with eyeball movement.  Other than above, a 14 point review of systems is negative      Past Medical History:  Diagnosis Date  . Allergy   . BACK PAIN   . ELEVATED BP READING WITHOUT DX HYPERTENSION 05/03/2008   Qualifier: Diagnosis of  By: Larose Kells MD, [place] GERD (gastroesophageal reflux disease)   . H/O cardiovascular stress test 2005    (-)  . Hyperlipidemia   . INSOMNIA-SLEEP DISORDER-UNSPEC   . PVC (premature ventricular contraction)     Past Surgical History:  Procedure Laterality Date  . CHOLECYSTECTOMY    . POLYPECTOMY    . TONSILLECTOMY     age 28 y/o    Social History   Socioeconomic History  . Marital status: Married    Spouse name: Not on file  . Number of children: 2  . Years of education: Not on file  . Highest education level: Not on file  Occupational History  . Occupation: finances , used to work for [employer]  . Financial resource strain: Not on file  . Food insecurity:    Worry: Not on file    Inability: Not on file  . Transportation needs:    Medical: Not on file    Non-medical: Not on file  Tobacco Use  . Smoking status: Former Smoker    Last attempt to quit: 09/24/1988    Years since quitting: 28.8  . Smokeless tobacco: Former Systems developer    Types: Chew    Quit date: 1970  Substance and Sexual Activity  . Alcohol use: Yes    Alcohol/week: 2.4 oz    Types: 4 Glasses of wine per week    Comment:  occasionally on weekends  . Drug use: No  . Sexual activity: Not on file  Lifestyle  . Physical activity:    Days per week: Not on file    Minutes per session: Not on file  . Stress: Not on file  Relationships  . Social connections:    Talks on phone: Not on file    Gets together: Not on file    Attends religious service: Not on file    Active member of club or organization: Not on file    Attends meetings of clubs or organizations: Not on file    Relationship status: Not on file  . Intimate partner violence:    Fear of current or ex partner: Not on file    Emotionally abused: Not on file    Physically abused: Not on file    Forced sexual activity: Not on file  Other Topics Concern  . Not on file  Social History Narrative   Household pt and wife    2 adult married children   Family History  Problem Relation Age of Onset  . Lung cancer Father        smoker  . Diabetes  Maternal Grandmother   . Diabetes Maternal Grandfather   . Hypertension Mother   . Heart attack Other        uncle MI at age 71s  . Stroke Other        GM, in her 58  . Colon cancer Neg Hx   . Prostate cancer Neg Hx   . Colon polyps Neg Hx   . Esophageal cancer Neg Hx   . Rectal cancer Neg Hx   . Stomach cancer Neg Hx   . Pancreatic cancer Neg Hx       Allergies as of 07/15/2017   No Known Allergies     Medication List        Accurate as of 07/15/17  4:59 PM. Always use your most recent med list.          atorvastatin 20 MG tablet Commonly known as:  LIPITOR Take 1 tablet (20 mg total) by mouth daily.   lansoprazole 15 MG capsule Commonly known as:  PREVACID Take 15 mg by mouth daily at 12 noon.   multivitamin tablet Take 1 tablet by mouth daily.   vitamin C 1000 MG tablet Take 1,000 mg by mouth daily.          Objective:   Physical Exam BP 126/80 (BP Location: Left Arm, Patient Position: Sitting, Cuff Size: Normal)   Pulse 69   Temp 97.9 F (36.6 C) (Oral)   Resp 14   Ht  5\' 11"  (1.803 m)   Wt 226 lb (102.5 kg)   SpO2 97%   BMI 31.52 kg/m  General:   Well developed, well nourished . NAD.  Neck: No  thyromegaly  HEENT:  Normocephalic . Face symmetric, atraumatic Lungs:  CTA B Normal respiratory effort, no intercostal retractions, no accessory muscle use. Heart: RRR,  no murmur.  No pretibial edema bilaterally  Abdomen:  Not distended, soft, non-tender. No rebound or rigidity.   Skin: Multiple skin lesions in the back, some of them look like SKs. Neurologic:  alert & oriented X3.  Speech normal, gait appropriate for age and unassisted Strength symmetric and appropriate for age.  Psych: Cognition and judgment appear intact.  Cooperative with normal attention span and concentration.  Behavior appropriate. No anxious or depressed appearing.     Assessment & Plan:   Assessment  Hyperlipidemia Elevated BP Anxiety, insomnia PVCs -palpitations-- BB prn, sx usually related to anxiety Stress test 2005 negative  PLAN Hyperlipidemia, on Lipitor, checking labs Elevated BP: BP normal GERD: Going on for many years, ranitidine did not help.  On chronic PPIs with good control of sx, I reinforced the need of precautions. Ok PPIs long term for now, pro/cons discussed  Mild tinnitus: As above, red flag symptoms discussed such as severe or unilateral sxs w/ HOH Eye pain, left-sided, mild, unclear etiology, refer to ophthalmology. Moles: He has a variety of moles including SKs in the back, warning symptoms discussed, see a dermatologist is a very good option. RTC 1 year

## 2017-07-15 NOTE — Progress Notes (Signed)
Pre visit review using our clinic review tool, if applicable. No additional management support is needed unless otherwise documented below in the visit note. 

## 2017-07-15 NOTE — Patient Instructions (Signed)
GO TO THE LAB : Get the blood work     GO TO THE FRONT DESK Schedule your next appointment for a  Physical exam fasting in 1 year     Mole A mole is a colored (pigmented) growth on the skin. Moles are very common. They are usually harmless, but some moles can become cancerous over time. What are the causes? Moles occur when pigmented skin cells grow together in clusters instead of spreading out in the skin as they normally do. The reason why the skin cells grow together in clusters is not known. What are the signs or symptoms? A mole may be:  [place]ark or black.  Flat or raised.  Smooth or wrinkled.  How is this diagnosed? A mole is diagnosed with a skin exam. If your health care provider thinks a mole may be cancerous, a piece of the mole will be removed for testing. How is this treated? Treatment is not needed unless a mole is cancerous. If a mole is cancerous, it will be removed. If a mole is causing pain or you do not like the way it looks, you may choose to have it removed. Follow these instructions at home:  Every month, look for new moles and check your existing moles for changes. This is important because a change in a mole can mean that the mole has become cancerous. Look for changes in: ? Size. Look for moles that are more than  in (0.64 cm) wide (in diameter). ? Shape. Look for moles that are not round or oval. ? Borders. Look for moles that are not symmetrical. ? Color. Note that it is normal for moles to get darker during pregnancy or when you take birth control pills.  When you are outdoors, wear sunscreen with SPF 30 (sun protection factor 30) or higher. Reapply the sunscreen every 2-3 hours.  If you have a large number of moles, see a skin doctor (dermatologist) at least one time every year. Contact a health care provider if:  The size, shape, borders, or color of your mole change.  Your mole, or the skin near the mole, becomes painful, sore, red, or  swollen.  Your mole: ? Develops more than one color. ? Itches or bleeds. ? Becomes scaly, sheds skin, or oozes fluid. ? Becomes flat or develops raised areas. ? Becomes hard or soft.  You develop a new mole. This information is not intended to replace advice given to you by your health care provider. Make sure you discuss any questions you have with your health care provider. Document Released: 01/02/2001 Document Revised: 09/21/2015 Document Reviewed: 01/28/2015 Elsevier Interactive Patient Education  [employer].

## 2017-07-17 DIAGNOSIS — H43812 Vitreous degeneration, left eye: Secondary | ICD-10-CM | POA: Diagnosis not present

## 2017-07-17 DIAGNOSIS — H16403 Unspecified corneal neovascularization, bilateral: Secondary | ICD-10-CM | POA: Diagnosis not present

## 2017-07-17 DIAGNOSIS — H5712 Ocular pain, left eye: Secondary | ICD-10-CM | POA: Diagnosis not present

## 2017-07-17 DIAGNOSIS — H2513 Age-related nuclear cataract, bilateral: Secondary | ICD-10-CM | POA: Diagnosis not present

## 2017-08-01 ENCOUNTER — Encounter (INDEPENDENT_AMBULATORY_CARE_PROVIDER_SITE_OTHER): Payer: Self-pay

## 2017-08-08 ENCOUNTER — Ambulatory Visit (INDEPENDENT_AMBULATORY_CARE_PROVIDER_SITE_OTHER): Payer: BLUE CROSS/BLUE SHIELD | Admitting: Family Medicine

## 2017-08-08 ENCOUNTER — Encounter (INDEPENDENT_AMBULATORY_CARE_PROVIDER_SITE_OTHER): Payer: Self-pay | Admitting: Family Medicine

## 2017-08-08 VITALS — BP 145/77 | HR 61 | Temp 98.0°F | Ht 71.0 in | Wt 216.0 lb

## 2017-08-08 DIAGNOSIS — J4521 Mild intermittent asthma with (acute) exacerbation: Secondary | ICD-10-CM

## 2017-08-08 DIAGNOSIS — R739 Hyperglycemia, unspecified: Secondary | ICD-10-CM

## 2017-08-08 DIAGNOSIS — R5383 Other fatigue: Secondary | ICD-10-CM

## 2017-08-08 DIAGNOSIS — Z9189 Other specified personal risk factors, not elsewhere classified: Secondary | ICD-10-CM

## 2017-08-08 DIAGNOSIS — Z1331 Encounter for screening for depression: Secondary | ICD-10-CM

## 2017-08-08 DIAGNOSIS — R030 Elevated blood-pressure reading, without diagnosis of hypertension: Secondary | ICD-10-CM | POA: Diagnosis not present

## 2017-08-08 DIAGNOSIS — Z0289 Encounter for other administrative examinations: Secondary | ICD-10-CM

## 2017-08-08 DIAGNOSIS — Z6831 Body mass index (BMI) 31.0-31.9, adult: Secondary | ICD-10-CM | POA: Diagnosis not present

## 2017-08-08 DIAGNOSIS — E669 Obesity, unspecified: Secondary | ICD-10-CM | POA: Diagnosis not present

## 2017-08-09 LAB — INSULIN, RANDOM: INSULIN: 11.8 u[IU]/mL (ref 2.6–24.9)

## 2017-08-09 LAB — COMPREHENSIVE METABOLIC PANEL
ALK PHOS: 65 IU/L (ref 39–117)
ALT: 23 IU/L (ref 0–44)
AST: 22 IU/L (ref 0–40)
Albumin/Globulin Ratio: 1.8 (ref 1.2–2.2)
Albumin: 4.6 g/dL (ref 3.6–4.8)
BILIRUBIN TOTAL: 0.3 mg/dL (ref 0.0–1.2)
BUN/Creatinine Ratio: 22 (ref 10–24)
BUN: 24 mg/dL (ref 8–27)
CHLORIDE: 104 mmol/L (ref 96–106)
CO2: 24 mmol/L (ref 20–29)
Calcium: 9.2 mg/dL (ref 8.6–10.2)
Creatinine, Ser: 1.1 mg/dL (ref 0.76–1.27)
GFR calc non Af Amer: 72 mL/min/{1.73_m2} (ref >59)
GFR, EST AFRICAN AMERICAN: 83 mL/min/{1.73_m2} (ref >59)
Globulin, Total: 2.5 g/dL (ref 1.5–4.5)
Glucose: 95 mg/dL (ref 65–99)
Potassium: 4.3 mmol/L (ref 3.5–5.2)
Sodium: 142 mmol/L (ref 134–144)
TOTAL PROTEIN: 7.1 g/dL (ref 6.0–8.5)

## 2017-08-09 LAB — FOLATE: Folate: 18.5 ng/mL (ref >3.0)

## 2017-08-09 LAB — VITAMIN B12: Vitamin B-12: 828 pg/mL (ref 232–1245)

## 2017-08-09 LAB — VITAMIN D 25 HYDROXY (VIT D DEFICIENCY, FRACTURES): Vit D, 25-Hydroxy: 21.6 ng/mL — ABNORMAL LOW (ref 30.0–100.0)

## 2017-08-09 LAB — T4, FREE: FREE T4: 1.18 ng/dL (ref 0.82–1.77)

## 2017-08-09 LAB — HEMOGLOBIN A1C
Est. average glucose Bld gHb Est-mCnc: 117 mg/dL
HEMOGLOBIN A1C: 5.7 % — AB (ref 4.8–5.6)

## 2017-08-09 LAB — T3: T3 TOTAL: 123 ng/dL (ref 71–180)

## 2017-08-09 LAB — TSH: TSH: 2.08 u[IU]/mL (ref 0.450–4.500)

## 2017-08-12 NOTE — Progress Notes (Signed)
Office: [phone]  /  Fax: [phone]   Dear Dr. Larose Kells,   Thank you for referring Lona Kettle Conde to our clinic. The following note includes my evaluation and treatment recommendations.  HPI:   Chief Complaint: OBESITY    Jacquan Savas Shippee has been referred by Colon Branch, MD for consultation regarding his obesity and obesity related comorbidities.    SKIPPER DACOSTA (MR# [id number]) is a 62 y.o. male who presents on 08/12/2017 for obesity evaluation and treatment. Current BMI is Body mass index is 30.13 kg/m.[name] Hilary has been struggling with his weight for many years and has lost weight in the past, but cannot seem to maintain weight loss.     Kanan attended our information session and states he is currently in the action stage of change and ready to dedicate time achieving and maintaining a healthier weight. Shayden is interested in becoming our patient and working on intensive lifestyle modifications including (but not limited to) diet, exercise and weight loss.    Juniper states his family eats meals together he thinks his family will eat healthier with  him he struggles with family and or coworkers weight loss sabotage his desired weight loss is 35 lbs he has been heavy most of  his life his heaviest weight ever was 240 lbs. he has significant food cravings issues  he snacks frequently in the evenings he is frequently drinking liquids with calories he frequently makes poor food choices he frequently eats larger portions than normal  he has binge eating behaviors he struggles with emotional eating    Fatigue Macon feels his energy is lower than it should be. This has worsened with weight gain and has not worsened recently. Cleven admits to daytime somnolence and  admits to waking up still tired. Patient is at risk for obstructive sleep apnea. Patent has a history of symptoms of daytime fatigue, morning fatigue and morning headache. Patient generally gets 7 hours of  sleep per night, and states they generally have restful sleep. Snoring is present. Apneic episodes are present. Epworth Sleepiness Score is 5  Elevated Blood Pressure with no history of hypertension TEREL BANN is a 62 y.o. male with elevated blood pressure. He is not on medications. Lona Kettle Bachus denies chest pain or headache. He is attempting to work on weight loss to help control his blood pressure with the goal of decreasing his risk of heart attack and stroke. Patricks blood pressure is not currently controlled.  Hyperglycemia Sederick has a history of borderline elevated blood glucose readings in Epic without a diagnosis of diabetes. He admits to polyphagia.  At risk for diabetes Creek is at higher than average risk for developing diabetes due to his obesity and hyperglycemia. He currently denies polyuria or polydipsia.  Asthma Mild Intermittent Kacyn had asthma as a child but he's had no recent exacerbations. He doesn't use albuterol and he exercises with expected level of shortness of breath.  Depression Screen Vedanth's Food and Mood (modified PHQ-9) score was  Depression screen PHQ 2/9 08/08/2017  Decreased Interest 1  Down, Depressed, Hopeless 1  PHQ - 2 Score 2  Altered sleeping 2  Tired, decreased energy 2  Change in appetite 1  Feeling bad or failure about yourself  1  Trouble concentrating 1  Moving slowly or fidgety/restless 0  Suicidal thoughts 0  PHQ-9 Score 9  Difficult doing work/chores Not difficult at all    ALLERGIES: No Known Allergies  MEDICATIONS: Current Outpatient Medications on File  Prior to Visit  Medication Sig Dispense Refill  . Ascorbic Acid (VITAMIN C) 1000 MG tablet Take 1,000 mg by mouth daily.    [name] atorvastatin (LIPITOR) 20 MG tablet Take 1 tablet (20 mg total) by mouth daily. 90 tablet 2  . Cyanocobalamin (VITAMIN B 12 PO) Take 1 tablet by mouth daily.    . Multiple Vitamin (MULTIVITAMIN) tablet Take 1 tablet by mouth daily.        [name] omeprazole (PRILOSEC) 20 MG capsule Take 20 mg by mouth daily.     Current Facility-Administered Medications on File Prior to Visit  Medication Dose Route Frequency Provider Last Rate Last Dose  . 0.9 %  sodium chloride infusion  500 mL Intravenous Continuous Milus Banister, MD        PAST MEDICAL HISTORY: Past Medical History:  Diagnosis Date  . Allergy   . BACK PAIN   . ELEVATED BP READING WITHOUT DX HYPERTENSION 05/03/2008   Qualifier: Diagnosis of  By: Larose Kells MD, [place] GERD (gastroesophageal reflux disease)   . H/O cardiovascular stress test 2005    (-)  . Hyperlipidemia   . INSOMNIA-SLEEP DISORDER-UNSPEC   . PVC (premature ventricular contraction)     PAST SURGICAL HISTORY: Past Surgical History:  Procedure Laterality Date  . CHOLECYSTECTOMY    . POLYPECTOMY    . TONSILLECTOMY     age 22 y/o    SOCIAL HISTORY: Social History   Tobacco Use  . Smoking status: Former Smoker    Last attempt to quit: 09/24/1988    Years since quitting: 28.9  . Smokeless tobacco: Former Systems developer    Types: Chew    Quit date: 1970  Substance Use Topics  . Alcohol use: Yes    Alcohol/week: 2.4 oz    Types: 4 Glasses of wine per week    Comment: occasionally on weekends  . Drug use: No    FAMILY HISTORY: Family History  Problem Relation Age of Onset  . Lung cancer Father        smoker  . Diabetes Maternal Grandmother   . Diabetes Maternal Grandfather   . Hypertension Mother   . Depression Mother   . Obesity Mother   . Heart attack Other        uncle MI at age 50s  . Stroke Other        GM, in her 30  . Colon cancer Neg Hx   . Prostate cancer Neg Hx   . Colon polyps Neg Hx   . Esophageal cancer Neg Hx   . Rectal cancer Neg Hx   . Stomach cancer Neg Hx   . Pancreatic cancer Neg Hx     ROS: Review of Systems  Constitutional: Positive for malaise/fatigue.  Eyes:       Wear Glasses or Contacts  Cardiovascular: Negative for chest pain.  Gastrointestinal: Positive  for heartburn.  Genitourinary: Negative for frequency.  Neurological: Positive for headaches.  Endo/Heme/Allergies: Negative for polydipsia.       Positive for polyphagia    PHYSICAL EXAM: Blood pressure (!) 145/77, pulse 61, temperature 98 F (36.7 C), temperature source Oral, height 5\' 11"  (1.803 m), weight 216 lb (98 kg), SpO2 98 %. Body mass index is 30.13 kg/m. Physical Exam  Constitutional: He is oriented to person, place, and time. He appears well-developed and well-nourished.  HENT:  Head: Normocephalic and atraumatic.  Nose: Nose normal.  Eyes: EOM are normal. No scleral icterus.  Neck: Normal range  of motion. Neck supple. No thyromegaly present.  Cardiovascular: Normal rate and regular rhythm.  Pulmonary/Chest: Effort normal. No respiratory distress. He has wheezes (faint end expiratory wheeze).  Abdominal: Soft. There is no tenderness.  + obesity  Musculoskeletal: Normal range of motion.  Range of Motion normal in all 4 extremities  Neurological: He is alert and oriented to person, place, and time. Coordination normal.  Skin: Skin is warm and dry.  Psychiatric: He has a normal mood and affect. His behavior is normal.  Vitals reviewed.   RECENT LABS AND TESTS: BMET    Component Value Date/Time   NA 142 08/08/2017 1016   K 4.3 08/08/2017 1016   CL 104 08/08/2017 1016   CO2 24 08/08/2017 1016   GLUCOSE 95 08/08/2017 1016   GLUCOSE 100 (H) 07/15/2017 0828   BUN 24 08/08/2017 1016   CREATININE 1.10 08/08/2017 1016   CALCIUM 9.2 08/08/2017 1016   GFRNONAA 72 08/08/2017 1016   GFRAA 83 08/08/2017 1016   Lab Results  Component Value Date   HGBA1C 5.7 (H) 08/08/2017   Lab Results  Component Value Date   INSULIN 11.8 08/08/2017   CBC    Component Value Date/Time   WBC 6.0 07/15/2017 0828   RBC 5.08 07/15/2017 0828   HGB 14.6 07/15/2017 0828   HCT 42.8 07/15/2017 0828   PLT 222.0 07/15/2017 0828   MCV 84.2 07/15/2017 0828   MCH 29.6 12/18/2013 2329    MCHC 34.0 07/15/2017 0828   RDW 14.5 07/15/2017 0828   LYMPHSABS 1.7 07/15/2017 0828   MONOABS 0.4 07/15/2017 0828   EOSABS 0.2 07/15/2017 0828   BASOSABS 0.0 07/15/2017 0828   Iron/TIBC/Ferritin/ %Sat No results found for: IRON, TIBC, FERRITIN, IRONPCTSAT Lipid Panel     Component Value Date/Time   CHOL 170 07/15/2017 0828   TRIG 205.0 (H) 07/15/2017 0828   HDL 39.80 07/15/2017 0828   CHOLHDL 4 07/15/2017 0828   VLDL 41.0 (H) 07/15/2017 0828   LDLCALC 124 (H) 07/10/2016 0827   LDLDIRECT 105.0 07/15/2017 0828   Hepatic Function Panel     Component Value Date/Time   PROT 7.1 08/08/2017 1016   ALBUMIN 4.6 08/08/2017 1016   AST 22 08/08/2017 1016   ALT 23 08/08/2017 1016   ALKPHOS 65 08/08/2017 1016   BILITOT 0.3 08/08/2017 1016   BILIDIR 0.1 06/07/2010 0908      Component Value Date/Time   TSH 2.080 08/08/2017 1016   TSH 2.14 07/15/2017 0828   TSH 1.62 07/08/2015 1405    ECG  shows NSR with a rate of 68 BPM INDIRECT CALORIMETER done today shows a VO2 of 260 and a REE of 1807.  His calculated basal metabolic rate is 4315 thus his basal metabolic rate is worse than expected.    ASSESSMENT AND PLAN: Other fatigue - Plan: EKG 12-Lead, Comprehensive metabolic panel, Vitamin Q00, VITAMIN D 25 Hydroxy (Vit-D Deficiency, Fractures), Folate, T3, T4, free, TSH  Elevated BP without diagnosis of hypertension  Hyperglycemia - Plan: Hemoglobin A1c, Insulin, random  Mild intermittent asthma with acute exacerbation  Screening for depression  At risk for diabetes mellitus  Class 1 obesity with serious comorbidity and body mass index (BMI) of 31.0 to 31.9 in adult, unspecified obesity type  PLAN: Fatigue Johnatha was informed that his fatigue may be related to obesity, depression or many other causes. Labs will be ordered, and in the meanwhile Michaeljohn has agreed to work on diet, exercise and weight loss to help with fatigue. Proper  sleep hygiene was discussed including the need  for 7-8 hours of quality sleep each night. A sleep study was not ordered based on symptoms and Epworth score.  Elevated Blood Pressure with no history of hypertension We discussed sodium restriction, working on healthy weight loss, and a regular exercise program as the means to achieve improved blood pressure control. Gabriella agreed with this plan and agreed to follow up as directed. We will continue to monitor his blood pressure as well as his progress with the above lifestyle modifications. He will watch for signs of hypotension as he continues his lifestyle modifications.  Hyperglycemia Fasting labs will be obtained and results with be discussed with Saralyn Pilar in 2 weeks at his follow up visit. In the meanwhile Walid was started on a lower simple carbohydrate diet and will work on weight loss efforts.  Diabetes risk counseling Lem was given extended (15 minutes) diabetes prevention counseling today. He is 62 y.o. male and has risk factors for diabetes including obesity and hyperglycemia. We discussed intensive lifestyle modifications today with an emphasis on weight loss as well as increasing exercise and decreasing simple carbohydrates in his diet.  Asthma Mild Intermittent We will order indirect calorimetry today. This is unlikely to affect his exercise. We will continue to montior and Ethon agrees to follow up as directed.  Depression Screen Fitzhugh had a mildly positive depression screening. Depression is commonly associated with obesity and often results in emotional eating behaviors. We will monitor this closely and work on CBT to help improve the non-hunger eating patterns. Referral to Psychology may be required if no improvement is seen as he continues in our clinic.  Obesity Rigdon is currently in the action stage of change and his goal is to continue with weight loss efforts. I recommend Infant begin the structured treatment plan as follows:  He has agreed to follow the  Category 3 plan Witt has been instructed to eventually work up to a goal of 150 minutes of combined cardio and strengthening exercise per week for weight loss and overall health benefits. We discussed the following Behavioral Modification Strategies today: increasing lean protein intake, decreasing simple carbohydrates  and work on meal planning and easy cooking plans   He was informed of the importance of frequent follow up visits to maximize his success with intensive lifestyle modifications for his multiple health conditions. He was informed we would discuss his lab results at his next visit unless there is a critical issue that needs to be addressed sooner. Nikolaus agreed to keep his next visit at the agreed upon time to discuss these results.    OBESITY BEHAVIORAL INTERVENTION VISIT  Today's visit was # 1 out of 22.  Starting weight: 216 lbs Starting date: 08/08/17 Today's weight : 216 lbs Today's date: 08/08/2017 Total lbs lost to date: 0 (Patients must lose 7 lbs in the first 6 months to continue with counseling)   ASK: We discussed the diagnosis of obesity with [name] today and Daemien agreed to give [name] permission to discuss obesity behavioral modification therapy today.  ASSESS: Ori has the diagnosis of obesity and his BMI today is 30.14 Jerusalem is in the action stage of change   ADVISE: Jeron was educated on the multiple health risks of obesity as well as the benefit of weight loss to improve his health. He was advised of the need for long term treatment and the importance of lifestyle modifications.  AGREE: Multiple dietary modification options and treatment options were discussed  and  Sheridan agreed to the above obesity treatment plan.   I, Doreene Nest, am acting as transcriptionist for  Dennard Nip, MD   I have reviewed the above documentation for accuracy and completeness, and I agree with the above. -Dennard Nip, MD

## 2017-08-22 ENCOUNTER — Ambulatory Visit (INDEPENDENT_AMBULATORY_CARE_PROVIDER_SITE_OTHER): Payer: BLUE CROSS/BLUE SHIELD | Admitting: Family Medicine

## 2017-08-22 ENCOUNTER — Encounter (INDEPENDENT_AMBULATORY_CARE_PROVIDER_SITE_OTHER): Payer: Self-pay | Admitting: Family Medicine

## 2017-08-22 ENCOUNTER — Inpatient Hospital Stay (HOSPITAL_BASED_OUTPATIENT_CLINIC_OR_DEPARTMENT_OTHER)
Admission: EM | Admit: 2017-08-22 | Discharge: 2017-08-24 | DRG: 247 | Disposition: A | Discharge status: Home / Self Care | Payer: BLUE CROSS/BLUE SHIELD | Attending: Cardiovascular Disease | Admitting: Cardiovascular Disease

## 2017-08-22 ENCOUNTER — Emergency Department (HOSPITAL_BASED_OUTPATIENT_CLINIC_OR_DEPARTMENT_OTHER): Payer: BLUE CROSS/BLUE SHIELD

## 2017-08-22 ENCOUNTER — Other Ambulatory Visit: Payer: Self-pay

## 2017-08-22 ENCOUNTER — Encounter (HOSPITAL_BASED_OUTPATIENT_CLINIC_OR_DEPARTMENT_OTHER): Payer: Self-pay | Admitting: *Deleted

## 2017-08-22 VITALS — BP 143/80 | HR 67 | Temp 98.3°F | Ht 71.0 in | Wt 216.0 lb

## 2017-08-22 DIAGNOSIS — M7981 Nontraumatic hematoma of soft tissue: Secondary | ICD-10-CM | POA: Diagnosis not present

## 2017-08-22 DIAGNOSIS — I10 Essential (primary) hypertension: Secondary | ICD-10-CM | POA: Diagnosis not present

## 2017-08-22 DIAGNOSIS — Z6830 Body mass index (BMI) 30.0-30.9, adult: Secondary | ICD-10-CM

## 2017-08-22 DIAGNOSIS — Z9189 Other specified personal risk factors, not elsewhere classified: Secondary | ICD-10-CM

## 2017-08-22 DIAGNOSIS — E559 Vitamin D deficiency, unspecified: Secondary | ICD-10-CM

## 2017-08-22 DIAGNOSIS — K219 Gastro-esophageal reflux disease without esophagitis: Secondary | ICD-10-CM | POA: Diagnosis not present

## 2017-08-22 DIAGNOSIS — Z8249 Family history of ischemic heart disease and other diseases of the circulatory system: Secondary | ICD-10-CM | POA: Diagnosis not present

## 2017-08-22 DIAGNOSIS — T79A0XA Compartment syndrome, unspecified, initial encounter: Secondary | ICD-10-CM | POA: Diagnosis not present

## 2017-08-22 DIAGNOSIS — I2510 Atherosclerotic heart disease of native coronary artery without angina pectoris: Secondary | ICD-10-CM | POA: Diagnosis not present

## 2017-08-22 DIAGNOSIS — I259 Chronic ischemic heart disease, unspecified: Secondary | ICD-10-CM | POA: Diagnosis not present

## 2017-08-22 DIAGNOSIS — Z87891 Personal history of nicotine dependence: Secondary | ICD-10-CM

## 2017-08-22 DIAGNOSIS — E785 Hyperlipidemia, unspecified: Secondary | ICD-10-CM | POA: Diagnosis present

## 2017-08-22 DIAGNOSIS — I214 Non-ST elevation (NSTEMI) myocardial infarction: Principal | ICD-10-CM | POA: Diagnosis present

## 2017-08-22 DIAGNOSIS — I493 Ventricular premature depolarization: Secondary | ICD-10-CM | POA: Diagnosis present

## 2017-08-22 DIAGNOSIS — R079 Chest pain, unspecified: Secondary | ICD-10-CM | POA: Diagnosis not present

## 2017-08-22 DIAGNOSIS — M79631 Pain in right forearm: Secondary | ICD-10-CM | POA: Diagnosis not present

## 2017-08-22 DIAGNOSIS — R7303 Prediabetes: Secondary | ICD-10-CM | POA: Diagnosis not present

## 2017-08-22 DIAGNOSIS — S55101A Unspecified injury of radial artery at forearm level, right arm, initial encounter: Secondary | ICD-10-CM | POA: Diagnosis not present

## 2017-08-22 DIAGNOSIS — Z79899 Other long term (current) drug therapy: Secondary | ICD-10-CM

## 2017-08-22 DIAGNOSIS — M79A11 Nontraumatic compartment syndrome of right upper extremity: Secondary | ICD-10-CM | POA: Diagnosis not present

## 2017-08-22 DIAGNOSIS — E669 Obesity, unspecified: Secondary | ICD-10-CM

## 2017-08-22 DIAGNOSIS — I340 Nonrheumatic mitral (valve) insufficiency: Secondary | ICD-10-CM | POA: Diagnosis not present

## 2017-08-22 LAB — CBC
HCT: 37.6 % — ABNORMAL LOW (ref 39.0–52.0)
Hemoglobin: 13.1 g/dL (ref 13.0–17.0)
MCH: 29.2 pg (ref 26.0–34.0)
MCHC: 34.8 g/dL (ref 30.0–36.0)
MCV: 83.9 fL (ref 78.0–100.0)
PLATELETS: 244 10*3/uL (ref 150–400)
RBC: 4.48 MIL/uL (ref 4.22–5.81)
RDW: 14.5 % (ref 11.5–15.5)
WBC: 10.9 10*3/uL — ABNORMAL HIGH (ref 4.0–10.5)

## 2017-08-22 LAB — BASIC METABOLIC PANEL
Anion gap: 9 (ref 5–15)
BUN: 27 mg/dL — ABNORMAL HIGH (ref 6–20)
CHLORIDE: 106 mmol/L (ref 101–111)
CO2: 21 mmol/L — ABNORMAL LOW (ref 22–32)
CREATININE: 0.99 mg/dL (ref 0.61–1.24)
Calcium: 9 mg/dL (ref 8.9–10.3)
GFR calc Af Amer: >60 mL/min (ref >60)
Glucose, Bld: 108 mg/dL — ABNORMAL HIGH (ref 65–99)
POTASSIUM: 3.7 mmol/L (ref 3.5–5.1)
SODIUM: 136 mmol/L (ref 135–145)

## 2017-08-22 LAB — TROPONIN I: TROPONIN I: 1.14 ng/mL — AB (ref <0.03)

## 2017-08-22 IMAGING — CR DG CHEST 2V
2 series · 2 of 2 positions shown · non-contrast
Comparison: Chest radiograph dated [DATE]

CLINICAL DATA: 62-year-old male with chest pain.

EXAM:
CHEST - 2 VIEW

[w chest pa]
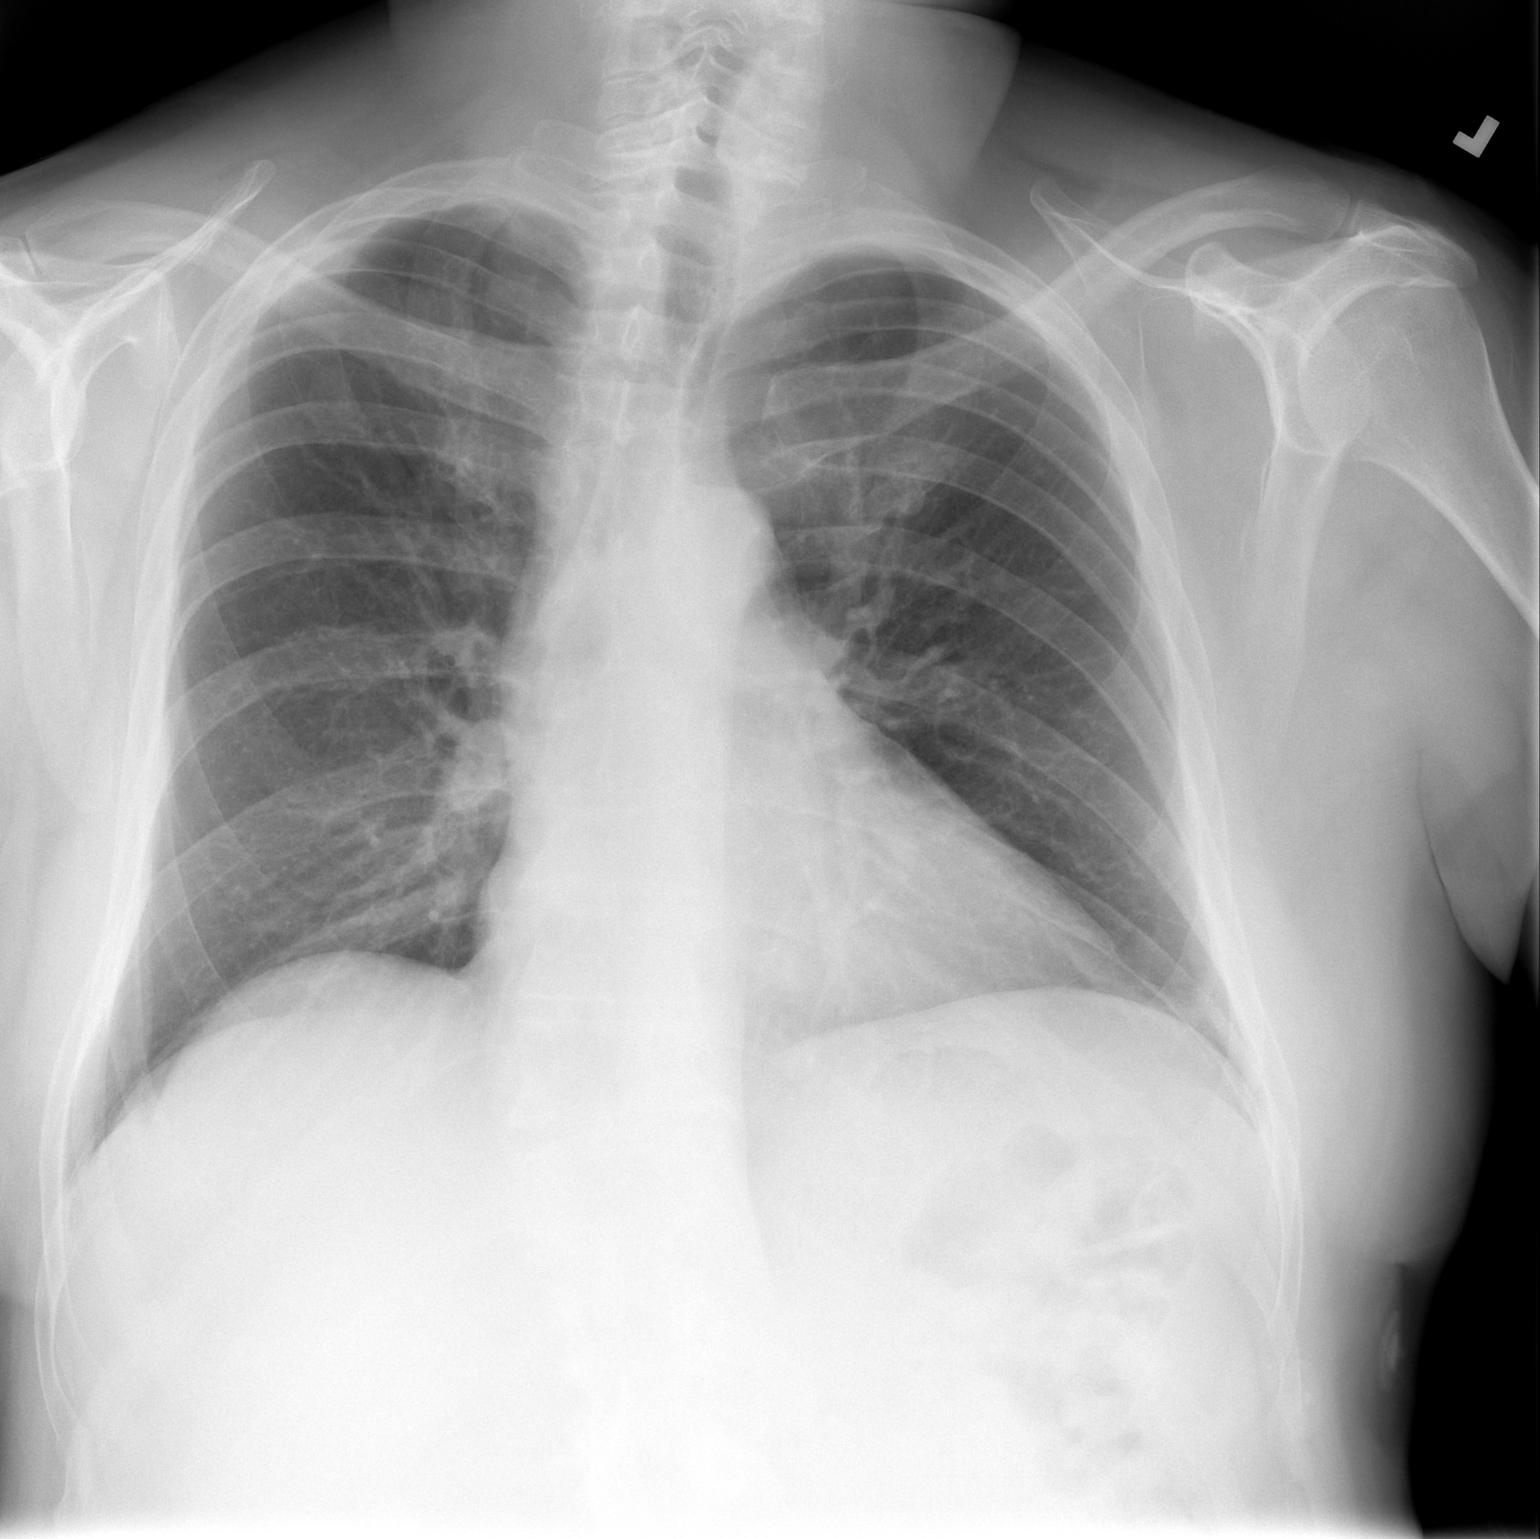

[w chest lat]
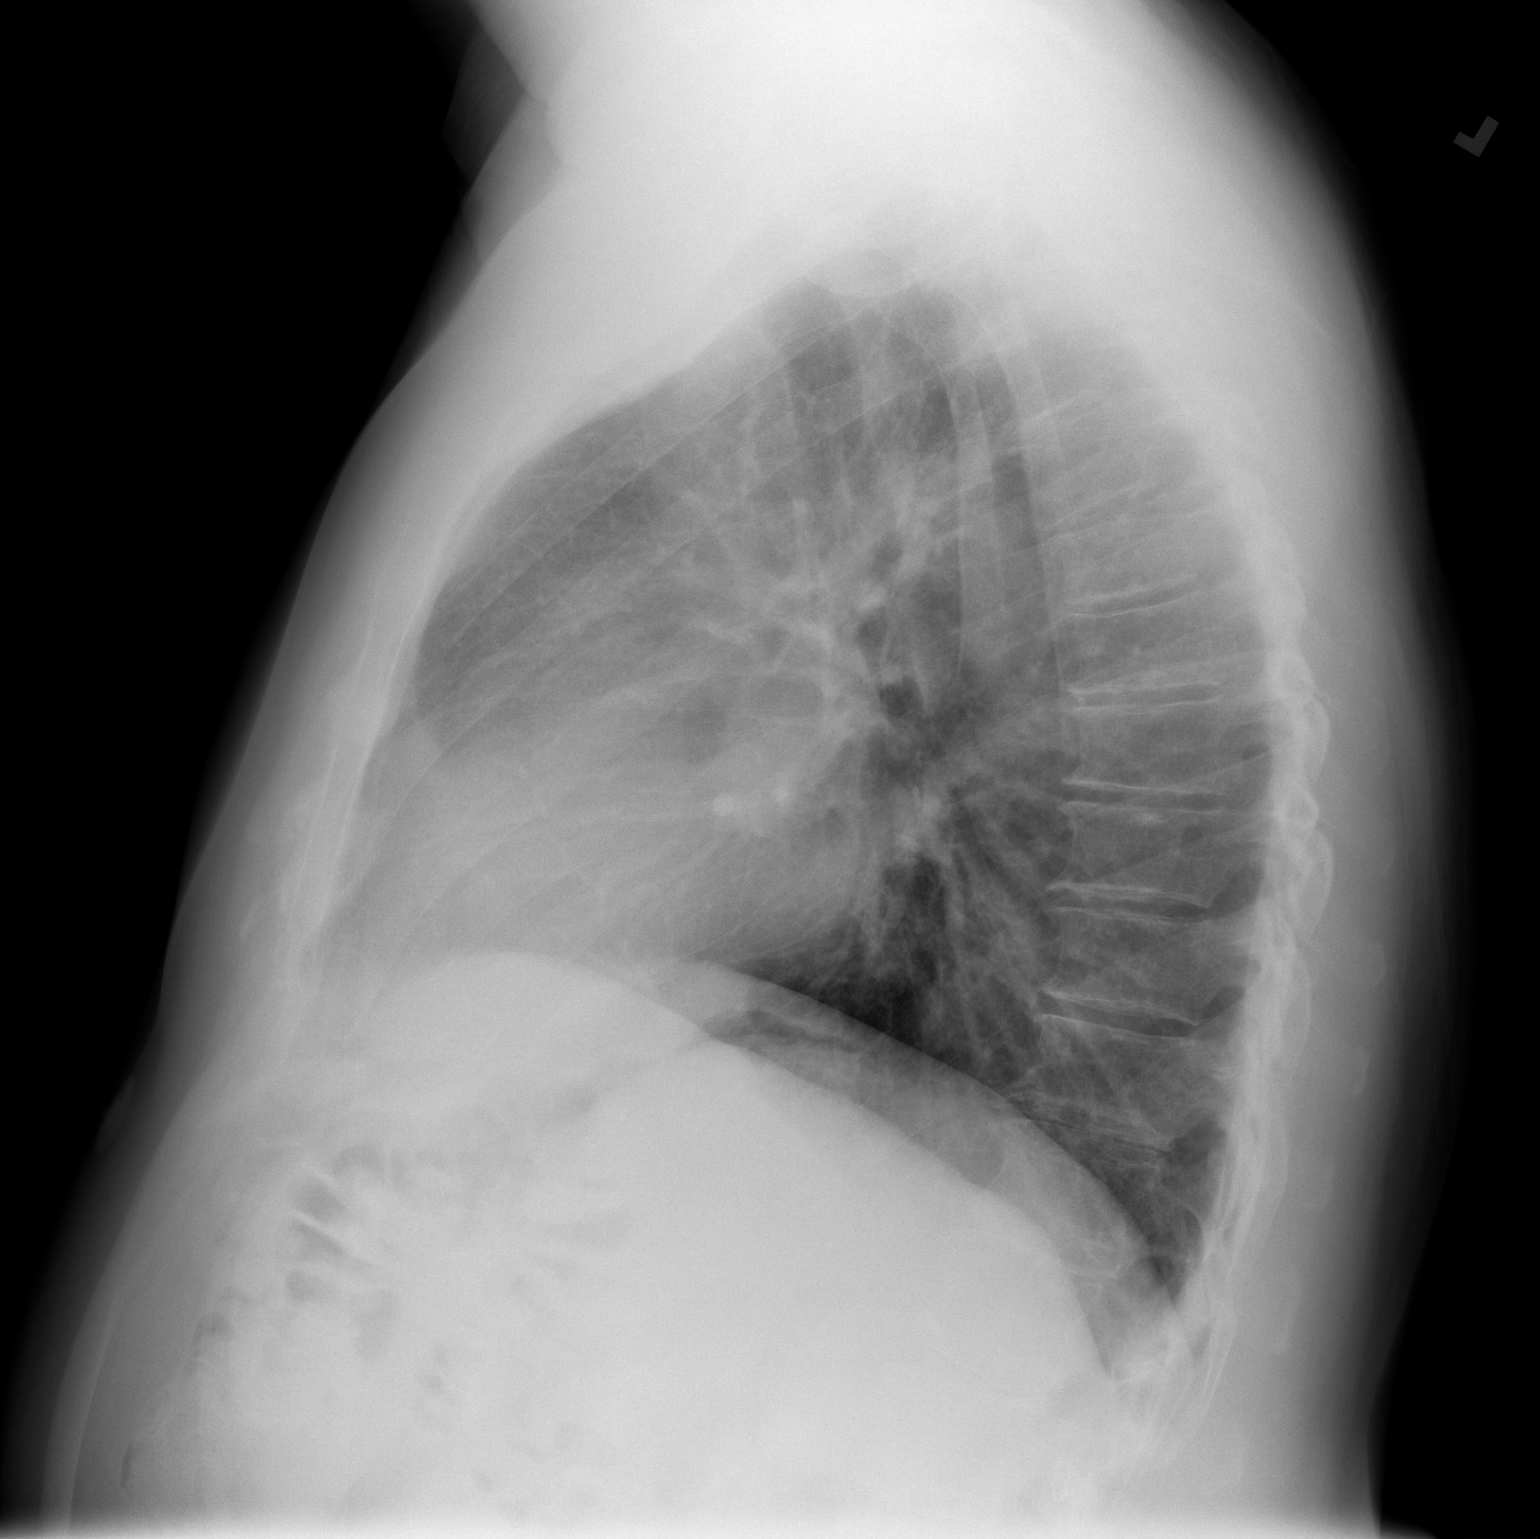

[2 of 2 positions shown; findings below may reference images not displayed]

FINDINGS: The heart size and mediastinal contours are within normal limits.
Both lungs are clear. The visualized skeletal structures are
unremarkable.
IMPRESSION: No active cardiopulmonary disease.

## 2017-08-22 MED ORDER — HEPARIN (PORCINE) IN NACL 100-0.45 UNIT/ML-% IJ SOLN
1000.0000 [IU]/h | INTRAMUSCULAR | Status: DC
  Administered 2017-08-22: 1000 [IU]/h via INTRAVENOUS
  Filled 2017-08-22: qty 250

## 2017-08-22 MED ORDER — HEPARIN BOLUS VIA INFUSION
4000.0000 [IU] | Freq: Once | INTRAVENOUS | Status: AC
  Administered 2017-08-22: 4000 [IU] via INTRAVENOUS

## 2017-08-22 MED ORDER — NITROGLYCERIN 0.4 MG SL SUBL
0.4000 mg | SUBLINGUAL_TABLET | SUBLINGUAL | Status: DC | PRN
  Administered 2017-08-22 (×2): 0.4 mg via SUBLINGUAL
  Filled 2017-08-22: qty 1

## 2017-08-22 MED ORDER — VITAMIN D (ERGOCALCIFEROL) 1.25 MG (50000 UNIT) PO CAPS: 50000.0000 [IU] | ORAL_CAPSULE | ORAL | 0 refills | Status: DC

## 2017-08-22 MED ORDER — METOPROLOL TARTRATE 5 MG/5ML IV SOLN
5.0000 mg | Freq: Once | INTRAVENOUS | Status: AC
  Administered 2017-08-22: 5 mg via INTRAVENOUS
  Filled 2017-08-22: qty 5

## 2017-08-22 NOTE — ED Provider Notes (Signed)
[facility] Provider Note: Georgena Spurling, MD, FACEP  CSN: [id number] MRN: [id number] ARRIVAL: 08/22/17 at 2158 ROOM: [facility]  Chest Pain   HISTORY OF PRESENT ILLNESS  08/22/17 11:28 PM Shane Wood is a 62 y.o. male with chest pain present since he awoke this morning.  He describes the pain as an ache.  He rated it as a 4 out of 10 at its worst and a 1 out of 10 presently.  It has been varying in severity throughout the day.  Nothing makes it worse or better.  There is some numbness in his left arm.  There is no associated shortness of breath, nausea or diaphoresis.  He does have a history of cardiovascular disease in his father and uncle.  He took 650 mg of aspirin earlier this evening.   Past Medical History:  Diagnosis Date  . Allergy   . BACK PAIN   . ELEVATED BP READING WITHOUT DX HYPERTENSION 05/03/2008   Qualifier: Diagnosis of  By: Larose Kells MD, [place] GERD (gastroesophageal reflux disease)   . H/O cardiovascular stress test 2005    (-)  . Hyperlipidemia   . INSOMNIA-SLEEP DISORDER-UNSPEC   . PVC (premature ventricular contraction)     Past Surgical History:  Procedure Laterality Date  . CHOLECYSTECTOMY    . POLYPECTOMY    . TONSILLECTOMY     age 29 y/o    Family History  Problem Relation Age of Onset  . Lung cancer Father        smoker  . Diabetes Maternal Grandmother   . Diabetes Maternal Grandfather   . Hypertension Mother   . Depression Mother   . Obesity Mother   . Heart attack Other        uncle MI at age 40s  . Stroke Other        GM, in her 66  . Colon cancer Neg Hx   . Prostate cancer Neg Hx   . Colon polyps Neg Hx   . Esophageal cancer Neg Hx   . Rectal cancer Neg Hx   . Stomach cancer Neg Hx   . Pancreatic cancer Neg Hx     Social History   Tobacco Use  . Smoking status: Former Smoker    Last attempt to quit: 09/24/1988    Years since quitting: 28.9  . Smokeless tobacco: Former Systems developer    Types: Chew      Quit date: 1970  Substance Use Topics  . Alcohol use: Yes    Alcohol/week: 2.4 oz    Types: 4 Glasses of wine per week    Comment: occasionally on weekends  . Drug use: No    Prior to Admission medications   Medication Sig Start Date End Date Taking? Authorizing Provider  Ascorbic Acid (VITAMIN C) 1000 MG tablet Take 1,000 mg by mouth daily.    [provider]  atorvastatin (LIPITOR) 20 MG tablet Take 1 tablet (20 mg total) by mouth daily. 10/08/16   Colon Branch, MD  Cyanocobalamin (VITAMIN B 12 PO) Take 1 tablet by mouth daily.    [provider]  Multiple Vitamin (MULTIVITAMIN) tablet Take 1 tablet by mouth daily.      [provider]  omeprazole (PRILOSEC) 20 MG capsule Take 20 mg by mouth daily.    [provider]  Vitamin D, Ergocalciferol, (DRISDOL) 50000 units CAPS capsule Take 1 capsule (50,000 Units total) by mouth every 7 (seven)  days. 08/22/17   Dennard Nip D, MD    Allergies Patient has no known allergies.   REVIEW OF SYSTEMS  Negative except as noted here or in the History of Present Illness.   PHYSICAL EXAMINATION  Initial Vital Signs Blood pressure 133/72, pulse 67, temperature 97.8 F (36.6 C), temperature source Oral, resp. rate 16, height 5\' 11"  (1.803 m), weight 98 kg (216 lb), SpO2 100 %.  Examination General: Well-developed, well-nourished male in no acute distress; appearance consistent with age of record HENT: normocephalic; atraumatic Eyes: pupils equal, round and reactive to light; extraocular muscles intact Neck: supple Heart: regular rate and rhythm; no murmur Lungs: clear to auscultation bilaterally Abdomen: soft; nondistended; nontender; bowel sounds present Extremities: No deformity; full range of motion; pulses normal Neurologic: Awake, alert and oriented; motor function intact in all extremities and symmetric; no facial droop Skin: Warm and dry Psychiatric: Normal mood and affect   RESULTS  Summary  of this visit's results, reviewed by myself:   EKG Interpretation  Date/Time:  Thursday Aug 22 2017 22:02:25 EDT Ventricular Rate:  63 PR Interval:  158 QRS Duration: 88 QT Interval:  418 QTC Calculation: 427 R Axis:   -3 Text Interpretation:  Normal sinus rhythm Possible Left atrial enlargement ST & T wave abnormality, consider inferolateral ischemia Abnormal ECG PVC seen previously Confirmed by Tareek Sabo [phone]) on 08/22/2017 10:37:18 PM       EKG Interpretation  Date/Time:  Thursday Aug 22 2017 23:39:34 EDT Ventricular Rate:  68 PR Interval:  158 QRS Duration: 94 QT Interval:  412 QTC Calculation: 439 R Axis:   4 Text Interpretation:  Sinus rhythm Probable left atrial enlargement Posterior infarct, old Nonspecific repol abnormality, diffuse leads No significant change was found Confirmed by Shanon Rosser [phone]) on 08/22/2017 11:57:43 PM       Laboratory Studies: Results for orders placed or performed during the hospital encounter of 08/22/17 (from the past 24 hour(s))  Basic metabolic panel     Status: Abnormal   Collection Time: 08/22/17 10:36 PM  Result Value Ref Range   Sodium 136 135 - 145 mmol/L   Potassium 3.7 3.5 - 5.1 mmol/L   Chloride 106 101 - 111 mmol/L   CO2 21 (L) 22 - 32 mmol/L   Glucose, Bld 108 (H) 65 - 99 mg/dL   BUN 27 (H) 6 - 20 mg/dL   Creatinine, Ser 0.99 0.61 - 1.24 mg/dL   Calcium 9.0 8.9 - 10.3 mg/dL   GFR calc non Af Amer >60 >60 mL/min   GFR calc Af Amer >60 >60 mL/min   Anion gap 9 5 - 15  CBC     Status: Abnormal   Collection Time: 08/22/17 10:36 PM  Result Value Ref Range   WBC 10.9 (H) 4.0 - 10.5 K/uL   RBC 4.48 4.22 - 5.81 MIL/uL   Hemoglobin 13.1 13.0 - 17.0 g/dL   HCT 37.6 (L) 39.0 - 52.0 %   MCV 83.9 78.0 - 100.0 fL   MCH 29.2 26.0 - 34.0 pg   MCHC 34.8 30.0 - 36.0 g/dL   RDW 14.5 11.5 - 15.5 %   Platelets 244 150 - 400 K/uL  Troponin I     Status: Abnormal   Collection Time: 08/22/17 10:36 PM  Result Value Ref Range    Troponin I 1.14 (HH) <0.03 ng/mL   Imaging Studies: Dg Chest 2 View  Result Date: 08/22/2017 CLINICAL DATA:  62 year old male with chest pain. EXAM: CHEST - 2  VIEW COMPARISON:  Chest radiograph dated 03/06/2017 FINDINGS: The heart size and mediastinal contours are within normal limits. Both lungs are clear. The visualized skeletal structures are unremarkable. IMPRESSION: No active cardiopulmonary disease. Electronically Signed   By: Anner Crete M.D.   On: 08/22/2017 23:23    ED COURSE and MDM  Nursing notes and initial vitals signs, including pulse oximetry, reviewed.  Vitals:   08/22/17 2202 08/22/17 2203 08/22/17 2341  BP:  133/72 140/80  Pulse:  67 69  Resp:  16 12  Temp:  97.8 F (36.6 C)   TempSrc:  Oral   SpO2:  100% 100%  Weight: 98 kg (216 lb)    Height: 5\' 11"  (1.803 m)     11:40 PM Heparin bolus and drip ordered for non-ST elevation MI.  Lopressor 5 mg ordered.  Nitroglycerin ordered to attempt to get the patient pain-free.  11:55 PM Discussed with Dr. Neena Rhymes who accepts patient for tran transfer to [facility] cardiology service.  12:08 AM Patient chest pain-free after nitroglycerin sublingually.  PROCEDURES   CRITICAL CARE Performed by: Shanon Rosser L Total critical care time: 35 minutes Critical care time was exclusive of separately billable procedures and treating other patients. Critical care was necessary to treat or prevent imminent or life-threatening deterioration. Critical care was time spent personally by me on the following activities: development of treatment plan with patient and/or surrogate as well as nursing, discussions with consultants, evaluation of patient's response to treatment, examination of patient, obtaining history from patient or surrogate, ordering and performing treatments and interventions, ordering and review of laboratory studies, ordering and review of radiographic studies, pulse oximetry and re-evaluation of patient's  condition.   ED DIAGNOSES     ICD-10-CM   1. NSTEMI (non-ST elevated myocardial infarction) ([place]) I21.4        Krishan Mcbreen, MD 08/23/17 0120

## 2017-08-22 NOTE — ED Notes (Signed)
Patient transported to X-ray 

## 2017-08-22 NOTE — ED Triage Notes (Signed)
Chest pain since this am. No SOB. Left arm pain.

## 2017-08-23 ENCOUNTER — Inpatient Hospital Stay (HOSPITAL_COMMUNITY): Payer: BLUE CROSS/BLUE SHIELD | Admitting: Critical Care Medicine

## 2017-08-23 ENCOUNTER — Encounter (HOSPITAL_COMMUNITY)
Admission: EM | Disposition: A | Discharge status: Home / Self Care | Payer: Self-pay | Source: Home / Self Care | Attending: Cardiovascular Disease

## 2017-08-23 ENCOUNTER — Encounter (HOSPITAL_COMMUNITY): Payer: Self-pay

## 2017-08-23 ENCOUNTER — Other Ambulatory Visit (HOSPITAL_COMMUNITY): Payer: Self-pay

## 2017-08-23 DIAGNOSIS — Z87891 Personal history of nicotine dependence: Secondary | ICD-10-CM | POA: Diagnosis not present

## 2017-08-23 DIAGNOSIS — K219 Gastro-esophageal reflux disease without esophagitis: Secondary | ICD-10-CM | POA: Diagnosis not present

## 2017-08-23 DIAGNOSIS — E785 Hyperlipidemia, unspecified: Secondary | ICD-10-CM | POA: Diagnosis not present

## 2017-08-23 DIAGNOSIS — I493 Ventricular premature depolarization: Secondary | ICD-10-CM | POA: Diagnosis not present

## 2017-08-23 DIAGNOSIS — M7981 Nontraumatic hematoma of soft tissue: Secondary | ICD-10-CM | POA: Diagnosis not present

## 2017-08-23 DIAGNOSIS — Z8249 Family history of ischemic heart disease and other diseases of the circulatory system: Secondary | ICD-10-CM | POA: Diagnosis not present

## 2017-08-23 DIAGNOSIS — M79A11 Nontraumatic compartment syndrome of right upper extremity: Secondary | ICD-10-CM | POA: Diagnosis not present

## 2017-08-23 DIAGNOSIS — I214 Non-ST elevation (NSTEMI) myocardial infarction: Secondary | ICD-10-CM | POA: Diagnosis not present

## 2017-08-23 DIAGNOSIS — M79631 Pain in right forearm: Secondary | ICD-10-CM | POA: Diagnosis not present

## 2017-08-23 DIAGNOSIS — I10 Essential (primary) hypertension: Secondary | ICD-10-CM | POA: Diagnosis not present

## 2017-08-23 DIAGNOSIS — Z79899 Other long term (current) drug therapy: Secondary | ICD-10-CM | POA: Diagnosis not present

## 2017-08-23 DIAGNOSIS — I2510 Atherosclerotic heart disease of native coronary artery without angina pectoris: Secondary | ICD-10-CM

## 2017-08-23 DIAGNOSIS — I340 Nonrheumatic mitral (valve) insufficiency: Secondary | ICD-10-CM | POA: Diagnosis not present

## 2017-08-23 DIAGNOSIS — I259 Chronic ischemic heart disease, unspecified: Secondary | ICD-10-CM | POA: Diagnosis not present

## 2017-08-23 DIAGNOSIS — T79A0XA Compartment syndrome, unspecified, initial encounter: Secondary | ICD-10-CM | POA: Diagnosis not present

## 2017-08-23 DIAGNOSIS — S55101A Unspecified injury of radial artery at forearm level, right arm, initial encounter: Secondary | ICD-10-CM | POA: Diagnosis not present

## 2017-08-23 HISTORY — PX: ARTERY REPAIR: SHX559

## 2017-08-23 HISTORY — PX: CORONARY STENT INTERVENTION: CATH118234

## 2017-08-23 HISTORY — PX: LEFT HEART CATH AND CORONARY ANGIOGRAPHY: CATH118249

## 2017-08-23 LAB — POCT ACTIVATED CLOTTING TIME
ACTIVATED CLOTTING TIME: 268 s
ACTIVATED CLOTTING TIME: 351 s
Activated Clotting Time: 351 seconds

## 2017-08-23 LAB — CBC
HEMATOCRIT: 37.9 % — AB (ref 39.0–52.0)
HEMOGLOBIN: 12.3 g/dL — AB (ref 13.0–17.0)
MCH: 27.8 pg (ref 26.0–34.0)
MCHC: 32.5 g/dL (ref 30.0–36.0)
MCV: 85.6 fL (ref 78.0–100.0)
Platelets: 236 10*3/uL (ref 150–400)
RBC: 4.43 MIL/uL (ref 4.22–5.81)
RDW: 14.5 % (ref 11.5–15.5)
WBC: 9 10*3/uL (ref 4.0–10.5)

## 2017-08-23 LAB — HEPARIN LEVEL (UNFRACTIONATED): Heparin Unfractionated: 0.6 IU/mL (ref 0.30–0.70)

## 2017-08-23 LAB — BASIC METABOLIC PANEL
ANION GAP: 7 (ref 5–15)
BUN: 25 mg/dL — ABNORMAL HIGH (ref 6–20)
CHLORIDE: 106 mmol/L (ref 101–111)
CO2: 27 mmol/L (ref 22–32)
Calcium: 9.1 mg/dL (ref 8.9–10.3)
Creatinine, Ser: 1.08 mg/dL (ref 0.61–1.24)
GFR calc Af Amer: >60 mL/min (ref >60)
GFR calc non Af Amer: >60 mL/min (ref >60)
GLUCOSE: 108 mg/dL — AB (ref 65–99)
POTASSIUM: 4.4 mmol/L (ref 3.5–5.1)
Sodium: 140 mmol/L (ref 135–145)

## 2017-08-23 LAB — TROPONIN I: Troponin I: 3.64 ng/mL (ref <0.03)

## 2017-08-23 LAB — HIV ANTIBODY (ROUTINE TESTING W REFLEX): HIV Screen 4th Generation wRfx: NONREACTIVE

## 2017-08-23 LAB — MRSA PCR SCREENING: MRSA by PCR: NEGATIVE

## 2017-08-23 SURGERY — LEFT HEART CATH AND CORONARY ANGIOGRAPHY
Anesthesia: LOCAL

## 2017-08-23 SURGERY — REPAIR, ARTERY, BRACHIAL
Anesthesia: General | Site: Arm Lower | Laterality: Right

## 2017-08-23 MED ORDER — FENTANYL CITRATE (PF) 100 MCG/2ML IJ SOLN
INTRAMUSCULAR | Status: AC
  Filled 2017-08-23: qty 2

## 2017-08-23 MED ORDER — TIROFIBAN HCL IN NACL 5-0.9 MG/100ML-% IV SOLN
INTRAVENOUS | Status: AC
  Filled 2017-08-23: qty 100

## 2017-08-23 MED ORDER — IOHEXOL 350 MG/ML SOLN
INTRAVENOUS | Status: DC | PRN
  Administered 2017-08-23: 180 mL via INTRA_ARTERIAL

## 2017-08-23 MED ORDER — TICAGRELOR 90 MG PO TABS
90.0000 mg | ORAL_TABLET | Freq: Two times a day (BID) | ORAL | Status: DC
  Administered 2017-08-24: 90 mg via ORAL
  Filled 2017-08-23: qty 1

## 2017-08-23 MED ORDER — TIROFIBAN (AGGRASTAT) BOLUS VIA INFUSION
INTRAVENOUS | Status: DC | PRN
  Administered 2017-08-23: 2417.5 ug via INTRAVENOUS

## 2017-08-23 MED ORDER — PROPOFOL 10 MG/ML IV BOLUS
INTRAVENOUS | Status: AC
  Filled 2017-08-23: qty 20

## 2017-08-23 MED ORDER — SODIUM CHLORIDE 0.9 % IV SOLN
INTRAVENOUS | Status: DC | PRN
  Administered 2017-08-23: 500 mL

## 2017-08-23 MED ORDER — VERAPAMIL HCL 2.5 MG/ML IV SOLN
INTRAVENOUS | Status: AC
  Filled 2017-08-23: qty 2

## 2017-08-23 MED ORDER — HEPARIN (PORCINE) IN NACL 2-0.9 UNITS/ML
INTRAMUSCULAR | Status: AC | PRN
  Administered 2017-08-23 (×2): 500 mL via INTRA_ARTERIAL

## 2017-08-23 MED ORDER — TIROFIBAN HCL IN NACL 5-0.9 MG/100ML-% IV SOLN
INTRAVENOUS | Status: AC | PRN
  Administered 2017-08-23: 0.15 ug/kg/min via INTRAVENOUS

## 2017-08-23 MED ORDER — STERILE WATER FOR IRRIGATION IR SOLN
Status: DC | PRN
  Administered 2017-08-23: 1000 mL

## 2017-08-23 MED ORDER — HYDROMORPHONE HCL 1 MG/ML IJ SOLN
1.0000 mg | Freq: Once | INTRAMUSCULAR | Status: AC
  Administered 2017-08-23: 1 mg via INTRAVENOUS
  Filled 2017-08-23: qty 1

## 2017-08-23 MED ORDER — ONDANSETRON HCL 4 MG/2ML IJ SOLN
INTRAMUSCULAR | Status: DC | PRN
  Administered 2017-08-23: 4 mg via INTRAVENOUS

## 2017-08-23 MED ORDER — LIDOCAINE HCL (PF) 1 % IJ SOLN
INTRAMUSCULAR | Status: DC | PRN
  Administered 2017-08-23: 2 mL

## 2017-08-23 MED ORDER — ONDANSETRON HCL 4 MG/2ML IJ SOLN
4.0000 mg | Freq: Four times a day (QID) | INTRAMUSCULAR | Status: DC | PRN
  Administered 2017-08-23 (×2): 4 mg via INTRAVENOUS
  Filled 2017-08-23 (×2): qty 2

## 2017-08-23 MED ORDER — SODIUM CHLORIDE 0.9 % WEIGHT BASED INFUSION: 1.0000 mL/kg/h | INTRAVENOUS | Status: DC

## 2017-08-23 MED ORDER — ASPIRIN 81 MG PO CHEW: 81.0000 mg | CHEWABLE_TABLET | ORAL | Status: DC

## 2017-08-23 MED ORDER — PHENYLEPHRINE HCL 10 MG/ML IJ SOLN
INTRAMUSCULAR | Status: DC | PRN
  Administered 2017-08-23: 80 ug via INTRAVENOUS

## 2017-08-23 MED ORDER — ROCURONIUM BROMIDE 100 MG/10ML IV SOLN
INTRAVENOUS | Status: DC | PRN
  Administered 2017-08-23: 30 mg via INTRAVENOUS

## 2017-08-23 MED ORDER — MIDAZOLAM HCL 2 MG/2ML IJ SOLN
INTRAMUSCULAR | Status: AC
  Filled 2017-08-23: qty 2

## 2017-08-23 MED ORDER — ASPIRIN EC 81 MG PO TBEC
81.0000 mg | DELAYED_RELEASE_TABLET | Freq: Every day | ORAL | Status: DC
  Administered 2017-08-24: 81 mg via ORAL
  Filled 2017-08-23: qty 1

## 2017-08-23 MED ORDER — ACETAMINOPHEN 325 MG PO TABS: 650.0000 mg | ORAL_TABLET | ORAL | Status: DC | PRN

## 2017-08-23 MED ORDER — LABETALOL HCL 5 MG/ML IV SOLN
10.0000 mg | INTRAVENOUS | Status: AC | PRN
Start: 2017-08-23 — End: 2017-08-23

## 2017-08-23 MED ORDER — VERAPAMIL HCL 2.5 MG/ML IV SOLN
INTRAVENOUS | Status: DC | PRN
  Administered 2017-08-23 (×2): 200 ug via INTRACORONARY

## 2017-08-23 MED ORDER — TICAGRELOR 90 MG PO TABS
ORAL_TABLET | ORAL | Status: AC
  Filled 2017-08-23: qty 2

## 2017-08-23 MED ORDER — HEPARIN (PORCINE) IN NACL 100-0.45 UNIT/ML-% IJ SOLN: 1300.0000 [IU]/h | INTRAMUSCULAR | Status: DC

## 2017-08-23 MED ORDER — HEPARIN SODIUM (PORCINE) 1000 UNIT/ML IJ SOLN
INTRAMUSCULAR | Status: AC
  Filled 2017-08-23: qty 1

## 2017-08-23 MED ORDER — SODIUM CHLORIDE 0.9 % IV SOLN
INTRAVENOUS | Status: AC | PRN
  Administered 2017-08-23: 97 mL/h via INTRAVENOUS

## 2017-08-23 MED ORDER — LACTATED RINGERS IV SOLN
INTRAVENOUS | Status: DC | PRN
  Administered 2017-08-23: 17:00:00 via INTRAVENOUS

## 2017-08-23 MED ORDER — LIDOCAINE HCL (PF) 1 % IJ SOLN
INTRAMUSCULAR | Status: AC
  Filled 2017-08-23: qty 30

## 2017-08-23 MED ORDER — SUGAMMADEX SODIUM 200 MG/2ML IV SOLN
INTRAVENOUS | Status: DC | PRN
  Administered 2017-08-23: 200 mg via INTRAVENOUS

## 2017-08-23 MED ORDER — SODIUM CHLORIDE 0.9 % IV SOLN
INTRAVENOUS | Status: AC
  Filled 2017-08-23: qty 1.2

## 2017-08-23 MED ORDER — SODIUM CHLORIDE 0.9 % IV SOLN
250.0000 mL | INTRAVENOUS | Status: DC | PRN
  Administered 2017-08-23: 250 mL via INTRAVENOUS

## 2017-08-23 MED ORDER — 0.9 % SODIUM CHLORIDE (POUR BTL) OPTIME
TOPICAL | Status: DC | PRN
  Administered 2017-08-23 (×2): 1000 mL

## 2017-08-23 MED ORDER — ACETAMINOPHEN 325 MG PO TABS
650.0000 mg | ORAL_TABLET | ORAL | Status: DC | PRN
  Administered 2017-08-23: 650 mg via ORAL
  Filled 2017-08-23: qty 2

## 2017-08-23 MED ORDER — FENTANYL CITRATE (PF) 250 MCG/5ML IJ SOLN
INTRAMUSCULAR | Status: AC
  Filled 2017-08-23: qty 5

## 2017-08-23 MED ORDER — MORPHINE SULFATE (PF) 2 MG/ML IV SOLN
2.0000 mg | INTRAVENOUS | Status: DC | PRN
  Administered 2017-08-23: 2 mg via INTRAVENOUS
  Filled 2017-08-23: qty 1

## 2017-08-23 MED ORDER — SODIUM CHLORIDE 0.9% FLUSH: 3.0000 mL | INTRAVENOUS | Status: DC | PRN

## 2017-08-23 MED ORDER — MIDAZOLAM HCL 2 MG/2ML IJ SOLN
INTRAMUSCULAR | Status: DC | PRN
  Administered 2017-08-23: 1 mg via INTRAVENOUS
  Administered 2017-08-23: 2 mg via INTRAVENOUS

## 2017-08-23 MED ORDER — PANTOPRAZOLE SODIUM 40 MG PO TBEC
40.0000 mg | DELAYED_RELEASE_TABLET | Freq: Every day | ORAL | Status: DC
  Administered 2017-08-24: 40 mg via ORAL
  Filled 2017-08-23: qty 1

## 2017-08-23 MED ORDER — NITROGLYCERIN 0.4 MG SL SUBL: 0.4000 mg | SUBLINGUAL_TABLET | SUBLINGUAL | Status: DC | PRN

## 2017-08-23 MED ORDER — METOCLOPRAMIDE HCL 5 MG/ML IJ SOLN
INTRAMUSCULAR | Status: DC | PRN
  Administered 2017-08-23: 10 mg via INTRAVENOUS

## 2017-08-23 MED ORDER — SODIUM CHLORIDE 0.9 % WEIGHT BASED INFUSION: 3.0000 mL/kg/h | INTRAVENOUS | Status: DC

## 2017-08-23 MED ORDER — FENTANYL CITRATE (PF) 100 MCG/2ML IJ SOLN
INTRAMUSCULAR | Status: DC | PRN
  Administered 2017-08-23 (×2): 25 ug via INTRAVENOUS

## 2017-08-23 MED ORDER — SODIUM CHLORIDE 0.9% FLUSH
3.0000 mL | Freq: Two times a day (BID) | INTRAVENOUS | Status: DC
  Administered 2017-08-24: 3 mL via INTRAVENOUS

## 2017-08-23 MED ORDER — HEPARIN (PORCINE) IN NACL 1000-0.9 UT/500ML-% IV SOLN
INTRAVENOUS | Status: AC
  Filled 2017-08-23: qty 1000

## 2017-08-23 MED ORDER — METOPROLOL TARTRATE 12.5 MG HALF TABLET
12.5000 mg | ORAL_TABLET | Freq: Two times a day (BID) | ORAL | Status: DC
Start: 2017-08-23 — End: 2017-08-24
  Administered 2017-08-23 – 2017-08-24 (×2): 12.5 mg via ORAL
  Filled 2017-08-23 (×2): qty 1

## 2017-08-23 MED ORDER — LIDOCAINE HCL (CARDIAC) PF 100 MG/5ML IV SOSY
PREFILLED_SYRINGE | INTRAVENOUS | Status: DC | PRN
  Administered 2017-08-23: 60 mg via INTRAVENOUS

## 2017-08-23 MED ORDER — HEPARIN SODIUM (PORCINE) 1000 UNIT/ML IJ SOLN
INTRAMUSCULAR | Status: DC | PRN
  Administered 2017-08-23 (×2): 5000 [IU] via INTRAVENOUS
  Administered 2017-08-23: 4000 [IU] via INTRAVENOUS

## 2017-08-23 MED ORDER — CEFAZOLIN SODIUM-DEXTROSE 1-4 GM/50ML-% IV SOLN
INTRAVENOUS | Status: DC | PRN
  Administered 2017-08-23: 2 g via INTRAVENOUS

## 2017-08-23 MED ORDER — FENTANYL CITRATE (PF) 100 MCG/2ML IJ SOLN
INTRAMUSCULAR | Status: DC | PRN
  Administered 2017-08-23: 50 ug via INTRAVENOUS
  Administered 2017-08-23: 75 ug via INTRAVENOUS

## 2017-08-23 MED ORDER — TICAGRELOR 90 MG PO TABS
ORAL_TABLET | ORAL | Status: DC | PRN
  Administered 2017-08-23: 180 mg via ORAL

## 2017-08-23 MED ORDER — SODIUM CHLORIDE 0.9 % IV SOLN
INTRAVENOUS | Status: DC
  Administered 2017-08-23: 14:00:00 via INTRAVENOUS

## 2017-08-23 MED ORDER — ONDANSETRON HCL 4 MG/2ML IJ SOLN: 4.0000 mg | Freq: Four times a day (QID) | INTRAMUSCULAR | Status: DC | PRN

## 2017-08-23 MED ORDER — PHENYLEPHRINE HCL 10 MG/ML IJ SOLN
INTRAVENOUS | Status: DC | PRN
  Administered 2017-08-23: 25 ug/min via INTRAVENOUS

## 2017-08-23 MED ORDER — PROPOFOL 10 MG/ML IV BOLUS
INTRAVENOUS | Status: DC | PRN
  Administered 2017-08-23: 150 mg via INTRAVENOUS

## 2017-08-23 MED ORDER — DEXAMETHASONE SODIUM PHOSPHATE 10 MG/ML IJ SOLN
INTRAMUSCULAR | Status: DC | PRN
  Administered 2017-08-23: 5 mg via INTRAVENOUS

## 2017-08-23 MED ORDER — SUCCINYLCHOLINE CHLORIDE 20 MG/ML IJ SOLN
INTRAMUSCULAR | Status: DC | PRN
  Administered 2017-08-23: 100 mg via INTRAVENOUS

## 2017-08-23 MED ORDER — TICAGRELOR 90 MG PO TABS: 90.0000 mg | ORAL_TABLET | Freq: Two times a day (BID) | ORAL | Status: DC

## 2017-08-23 MED ORDER — VERAPAMIL HCL 2.5 MG/ML IV SOLN
INTRAVENOUS | Status: DC | PRN
  Administered 2017-08-23: 09:00:00 via INTRA_ARTERIAL

## 2017-08-23 MED ORDER — NITROGLYCERIN 1 MG/10 ML FOR IR/CATH LAB
INTRA_ARTERIAL | Status: DC | PRN
  Administered 2017-08-23: 200 ug via INTRACORONARY

## 2017-08-23 MED ORDER — ASPIRIN 81 MG PO CHEW: 81.0000 mg | CHEWABLE_TABLET | Freq: Every day | ORAL | Status: DC

## 2017-08-23 MED ORDER — HYDRALAZINE HCL 20 MG/ML IJ SOLN
5.0000 mg | INTRAMUSCULAR | Status: AC | PRN
Start: 2017-08-23 — End: 2017-08-23

## 2017-08-23 MED ORDER — ATORVASTATIN CALCIUM 80 MG PO TABS: 80.0000 mg | ORAL_TABLET | Freq: Every day | ORAL | Status: DC

## 2017-08-23 SURGICAL SUPPLY — 48 items
BANDAGE ACE 3X5.8 VEL STRL LF (GAUZE/BANDAGES/DRESSINGS) ×1 IMPLANT
BANDAGE ACE 4X5 VEL STRL LF (GAUZE/BANDAGES/DRESSINGS) ×1 IMPLANT
BNDG CMPR 9X4 STRL LF SNTH (GAUZE/BANDAGES/DRESSINGS)
BNDG ESMARK 4X9 LF (GAUZE/BANDAGES/DRESSINGS) IMPLANT
BNDG GAUZE ELAST 4 BULKY (GAUZE/BANDAGES/DRESSINGS) ×1 IMPLANT
BOOT SUTURE AID YELLOW STND (SUTURE) ×1 IMPLANT
CANISTER SUCT 3000ML PPV (MISCELLANEOUS) ×2 IMPLANT
CUFF TOURNIQUET SINGLE 18IN (TOURNIQUET CUFF) ×1 IMPLANT
DECANTER SPIKE VIAL GLASS SM (MISCELLANEOUS) IMPLANT
DRSG PAD ABDOMINAL 8X10 ST (GAUZE/BANDAGES/DRESSINGS) ×1 IMPLANT
ELECT REM PT RETURN 9FT ADLT (ELECTROSURGICAL) ×2
ELECTRODE REM PT RTRN 9FT ADLT (ELECTROSURGICAL) ×1 IMPLANT
GAUZE SPONGE 4X4 12PLY STRL (GAUZE/BANDAGES/DRESSINGS) ×2 IMPLANT
GAUZE SPONGE 4X4 16PLY XRAY LF (GAUZE/BANDAGES/DRESSINGS) IMPLANT
GAUZE XEROFORM 5X9 LF (GAUZE/BANDAGES/DRESSINGS) ×1 IMPLANT
GLOVE BIO SURGEON STRL SZ7.5 (GLOVE) ×2 IMPLANT
GLOVE BIOGEL PI IND STRL 6.5 (GLOVE) IMPLANT
GLOVE BIOGEL PI IND STRL 7.0 (GLOVE) IMPLANT
GLOVE BIOGEL PI IND STRL 7.5 (GLOVE) IMPLANT
GLOVE BIOGEL PI INDICATOR 6.5 (GLOVE) ×1
GLOVE BIOGEL PI INDICATOR 7.0 (GLOVE) ×1
GLOVE BIOGEL PI INDICATOR 7.5 (GLOVE) ×1
GLOVE ECLIPSE 7.0 STRL STRAW (GLOVE) ×1 IMPLANT
GLOVE SURG SS PI 6.5 STRL IVOR (GLOVE) ×2 IMPLANT
GOWN STRL REUS W/ TWL LRG LVL3 (GOWN DISPOSABLE) ×3 IMPLANT
GOWN STRL REUS W/TWL LRG LVL3 (GOWN DISPOSABLE) ×8
KIT BASIN OR (CUSTOM PROCEDURE TRAY) ×2 IMPLANT
KIT TURNOVER KIT B (KITS) ×2 IMPLANT
NS IRRIG 1000ML POUR BTL (IV SOLUTION) ×2 IMPLANT
PACK CV ACCESS (CUSTOM PROCEDURE TRAY) ×2 IMPLANT
PAD ARMBOARD 7.5X6 YLW CONV (MISCELLANEOUS) ×4 IMPLANT
PAD CAST 3X4 CTTN HI CHSV (CAST SUPPLIES) IMPLANT
PAD CAST 4YDX4 CTTN HI CHSV (CAST SUPPLIES) IMPLANT
PADDING CAST COTTON 3X4 STRL (CAST SUPPLIES) ×2
PADDING CAST COTTON 4X4 STRL (CAST SUPPLIES) ×2
SPONGE SURGIFOAM ABS GEL 100 (HEMOSTASIS) IMPLANT
STAPLER VISISTAT 35W (STAPLE) ×1 IMPLANT
STRIP CLOSURE SKIN 1/2X4 (GAUZE/BANDAGES/DRESSINGS) ×2 IMPLANT
SUT PROLENE 5 0 C 1 24 (SUTURE) IMPLANT
SUT PROLENE 6 0 CC (SUTURE) ×2 IMPLANT
SUT VIC AB 2-0 CT1 27 (SUTURE) ×4
SUT VIC AB 2-0 CT1 TAPERPNT 27 (SUTURE) IMPLANT
SUT VIC AB 3-0 SH 27 (SUTURE) ×2
SUT VIC AB 3-0 SH 27X BRD (SUTURE) ×1 IMPLANT
SUT VICRYL 4-0 PS2 18IN ABS (SUTURE) ×2 IMPLANT
TOWEL GREEN STERILE (TOWEL DISPOSABLE) ×2 IMPLANT
UNDERPAD 30X30 (UNDERPADS AND DIAPERS) ×2 IMPLANT
WATER STERILE IRR 1000ML POUR (IV SOLUTION) ×2 IMPLANT

## 2017-08-23 SURGICAL SUPPLY — 23 items
BALLN SAPPHIRE 2.5X15 (BALLOONS) ×2
BALLN ~~LOC~~ EMERGE MR 3.25X20 (BALLOONS) ×2
BALLOON SAPPHIRE 2.5X15 (BALLOONS) IMPLANT
BALLOON ~~LOC~~ EMERGE MR 3.25X20 (BALLOONS) IMPLANT
CATH INFINITI 5 FR JL3.5 (CATHETERS) ×1 IMPLANT
CATH INFINITI JR4 5F (CATHETERS) ×1 IMPLANT
CATH LAUNCHER 6FR AL1 (CATHETERS) IMPLANT
CATHETER LAUNCHER 6FR AL1 (CATHETERS) ×2
DEVICE RAD COMP TR BAND LRG (VASCULAR PRODUCTS) ×1 IMPLANT
GLIDESHEATH SLEND SS 6F .021 (SHEATH) ×1 IMPLANT
GUIDEWIRE INQWIRE 1.5J.035X260 (WIRE) IMPLANT
INQWIRE 1.5J .035X260CM (WIRE) ×2
KIT ENCORE 26 ADVANTAGE (KITS) ×1 IMPLANT
KIT HEART LEFT (KITS) ×2 IMPLANT
KIT HEMO VALVE WATCHDOG (MISCELLANEOUS) ×1 IMPLANT
PACK CARDIAC CATHETERIZATION (CUSTOM PROCEDURE TRAY) ×2 IMPLANT
STENT SYNERGY DES 2.5X16 (Permanent Stent) ×1 IMPLANT
STENT SYNERGY DES 2.75X38 (Permanent Stent) ×1 IMPLANT
STENT SYNERGY DES 4X12 (Permanent Stent) ×1 IMPLANT
TRANSDUCER W/STOPCOCK (MISCELLANEOUS) ×2 IMPLANT
TUBING CIL FLEX 10 FLL-RA (TUBING) ×2 IMPLANT
WIRE ASAHI PROWATER 180CM (WIRE) ×1 IMPLANT
WIRE HI TORQ VERSACORE-J 145CM (WIRE) ×1 IMPLANT

## 2017-08-23 NOTE — Progress Notes (Signed)
ANTICOAGULATION CONSULT NOTE - Initial Consult  Pharmacy Consult for heparin Indication: chest pain/ACS  No Known Allergies  Patient Measurements: Height: 5\' 10"  (177.8 cm) Weight: 213 lb 3 oz (96.7 kg) IBW/kg (Calculated) : 73 Heparin Dosing Weight: 93 kg  Vital Signs: Temp: 97.9 F (36.6 C) (05/03 0155) Temp Source: Oral (05/03 0155) BP: 104/67 (05/03 0155) Pulse Rate: 67 (05/03 0155)  Labs: Recent Labs    08/22/17 2236  HGB 13.1  HCT 37.6*  PLT 244  CREATININE 0.99  TROPONINI 1.14*    Estimated Creatinine Clearance: 90.3 mL/min (by C-G formula based on SCr of 0.99 mg/dL).   Medical History: Past Medical History:  Diagnosis Date  . Allergy   . BACK PAIN   . ELEVATED BP READING WITHOUT DX HYPERTENSION 05/03/2008   Qualifier: Diagnosis of  By: Larose Kells MD, [place] GERD (gastroesophageal reflux disease)   . H/O cardiovascular stress test 2005    (-)  . Hyperlipidemia   . INSOMNIA-SLEEP DISORDER-UNSPEC   . PVC (premature ventricular contraction)     Medications:  See medication history  Assessment: 62 yo man started on heparin drip at OSH.  He received a 4000 unit bolus and drip at 1000 units/hr.  He was not on anticoagulation PTA Goal of Therapy:  Heparin level 0.3-0.7 units/ml Monitor platelets by anticoagulation protocol: Yes   Plan:  Increase heparin drip to 1300 units/hr Check heparin level and CBC in 6 hours Daily  Heparin level and CBC while on heparin Monitor for bleeding complications   Roper Tolson Poteet 08/23/2017,2:14 AM

## 2017-08-23 NOTE — Op Note (Signed)
Procedure: Explore right radial artery  Preop: forearm hematoma with compartment syndrome   Postop: same Anesthesia: General  Asst: Gerri Lins PA-C  Findings: no active bleeding from radial artery  Details: Informed consent obtained.  To OR commence general anesthesia.  Right arm sterile prep and drape.  Longitudinal incision right wrist over radial artery puncture site.  Incision carried through subcutaneous tissue and fascia.  Radial artery exposed over several centimeters and dissected free circumferentially.  No active bleeding on thorough exploration of artery.  Good pulse and doppler flow in artery.  Longitudinal incision made mid forearm to explore muscle compartments.  Intramuscular hematoma with muscle edema.  Dr Burney Gauze scrubbed in to do forearm fasciotomy.  Separate op note for this and closure.  Ruta Hinds, MD [facility] Office: [phone] Pager: [phone]

## 2017-08-23 NOTE — Progress Notes (Signed)
Called report to short stay. Pt going to OR, consent signed.

## 2017-08-23 NOTE — Op Note (Signed)
Please see dictated report #[id number]

## 2017-08-23 NOTE — H&P (Signed)
CARDIOLOGY H&P  HPI:  Shane Wood is a 62 y.o. male w/ HLD, GERD, and no known cardiac history presenting with chest pain. Symptoms started upon awakening this AM. Felt an aching chest pain 4/10 in severity. Has waxed/waned throughout the day. Has not had any symptoms like this before. No recent illnesses, fevers. No illicit substance use.   Came to ED, found to have elevated troponin. Was given ASA, several doses of SLN which helped relieve his symptoms. Currently chest pain free.   Review of Systems:     Cardiac Review of Systems: {Y] = yes [ ]  = no  Chest Pain [  Y  ]  Resting SOB [   ] Exertional SOB  [ Y ]  Orthopnea [  ]   Pedal Edema [   ]    Palpitations [  ] Syncope  [  ]   Presyncope [   ]  General Review of Systems: [Y] = yes [  ]=no Constitional: recent weight change [  ]; anorexia [  ]; fatigue [  ]; nausea [  ]; night sweats [  ]; fever [  ]; or chills [  ];                                                                     Dental: poor dentition[  ];   Eye : blurred vision [  ]; diplopia [   ]; vision changes [  ];  Amaurosis fugax[  ]; Resp: cough [  ];  wheezing[  ];  hemoptysis[  ]; shortness of breath[  ]; paroxysmal nocturnal dyspnea[  ]; dyspnea on exertion[  ]; or orthopnea[  ];  GI:  gallstones[  ], vomiting[  ];  dysphagia[  ]; melena[  ];  hematochezia [  ]; heartburn[  ];   GU: kidney stones [  ]; hematuria[  ];   dysuria [  ];  nocturia[  ];               Skin: rash [  ], swelling[  ];, hair loss[  ];  peripheral edema[  ];  or itching[  ]; Musculosketetal: myalgias[  ];  joint swelling[  ];  joint erythema[  ];  joint pain[  ];  back pain[  ];  Heme/Lymph: bruising[  ];  bleeding[  ];  anemia[  ];  Neuro: TIA[  ];  headaches[  ];  stroke[  ];  vertigo[  ];  seizures[  ];   paresthesias[  ];  difficulty walking[  ];  Psych:depression[  ]; anxiety[  ];  Endocrine: diabetes[  ];  thyroid dysfunction[  ];  Other:  Past Medical History:  Diagnosis Date    . Allergy   . BACK PAIN   . ELEVATED BP READING WITHOUT DX HYPERTENSION 05/03/2008   Qualifier: Diagnosis of  By: Larose Kells MD, [place] GERD (gastroesophageal reflux disease)   . H/O cardiovascular stress test 2005    (-)  . Hyperlipidemia   . INSOMNIA-SLEEP DISORDER-UNSPEC   . PVC (premature ventricular contraction)     Prior to Admission medications   Medication Sig Start Date End Date Taking? Authorizing Provider  Ascorbic Acid (VITAMIN C) 1000 MG tablet  Take 1,000 mg by mouth daily.    [provider]  atorvastatin (LIPITOR) 20 MG tablet Take 1 tablet (20 mg total) by mouth daily. 10/08/16   Colon Branch, MD  Cyanocobalamin (VITAMIN B 12 PO) Take 1 tablet by mouth daily.    [provider]  Multiple Vitamin (MULTIVITAMIN) tablet Take 1 tablet by mouth daily.      [provider]  omeprazole (PRILOSEC) 20 MG capsule Take 20 mg by mouth daily.    [provider]  Vitamin D, Ergocalciferol, (DRISDOL) 50000 units CAPS capsule Take 1 capsule (50,000 Units total) by mouth every 7 (seven) days. 08/22/17   Starlyn Skeans, MD    No Known Allergies  Social History   Socioeconomic History  . Marital status: Married    Spouse name: [name]  . Number of children: 2  . Years of education: Not on file  . Highest education level: Not on file  Occupational History  . Occupation: finances , used to work for [employer]  . Financial resource strain: Not on file  . Food insecurity:    Worry: Not on file    Inability: Not on file  . Transportation needs:    Medical: Not on file    Non-medical: Not on file  Tobacco Use  . Smoking status: Former Smoker    Last attempt to quit: 09/24/1988    Years since quitting: 28.9  . Smokeless tobacco: Former Systems developer    Types: Chew    Quit date: 1970  Substance and Sexual Activity  . Alcohol use: Yes    Alcohol/week: 2.4 oz    Types: 4 Glasses of wine per week    Comment: occasionally on weekends  . Drug use: No   . Sexual activity: Not on file  Lifestyle  . Physical activity:    Days per week: Not on file    Minutes per session: Not on file  . Stress: Not on file  Relationships  . Social connections:    Talks on phone: Not on file    Gets together: Not on file    Attends religious service: Not on file    Active member of club or organization: Not on file    Attends meetings of clubs or organizations: Not on file    Relationship status: Not on file  . Intimate partner violence:    Fear of current or ex partner: Not on file    Emotionally abused: Not on file    Physically abused: Not on file    Forced sexual activity: Not on file  Other Topics Concern  . Not on file  Social History Narrative   Household pt and wife    2 adult married children    Family History  Problem Relation Age of Onset  . Lung cancer Father        smoker  . Diabetes Maternal Grandmother   . Diabetes Maternal Grandfather   . Hypertension Mother   . Depression Mother   . Obesity Mother   . Heart attack Other        uncle MI at age 44s  . Stroke Other        GM, in her 18  . Colon cancer Neg Hx   . Prostate cancer Neg Hx   . Colon polyps Neg Hx   . Esophageal cancer Neg Hx   . Rectal cancer Neg Hx   . Stomach cancer Neg Hx   . Pancreatic  cancer Neg Hx     PHYSICAL EXAM: Vitals:   08/22/17 2341 08/23/17 0039  BP: 140/80 (!) 101/57  Pulse: 69   Resp: 12   Temp:    SpO2: 100%    General:  Well appearing. No respiratory difficulty HEENT: normal Neck: supple. no JVD. Carotids 2+ bilat; no bruits. No lymphadenopathy or thryomegaly appreciated. Cor: PMI nondisplaced. Regular rate & rhythm. No rubs, gallops or murmurs. Lungs: clear Abdomen: soft, nontender, nondistended. No hepatosplenomegaly. No bruits or masses. Good bowel sounds. Extremities: no cyanosis, clubbing, rash, edema Neuro: alert & oriented x 3, cranial nerves grossly intact. moves all 4 extremities w/o difficulty. Affect  pleasant.  ECG: NSR, posterior infarct, scattered ST depressions   Results for orders placed or performed during the hospital encounter of 08/22/17 (from the past 24 hour(s))  Basic metabolic panel     Status: Abnormal   Collection Time: 08/22/17 10:36 PM  Result Value Ref Range   Sodium 136 135 - 145 mmol/L   Potassium 3.7 3.5 - 5.1 mmol/L   Chloride 106 101 - 111 mmol/L   CO2 21 (L) 22 - 32 mmol/L   Glucose, Bld 108 (H) 65 - 99 mg/dL   BUN 27 (H) 6 - 20 mg/dL   Creatinine, Ser 0.99 0.61 - 1.24 mg/dL   Calcium 9.0 8.9 - 10.3 mg/dL   GFR calc non Af Amer >60 >60 mL/min   GFR calc Af Amer >60 >60 mL/min   Anion gap 9 5 - 15  CBC     Status: Abnormal   Collection Time: 08/22/17 10:36 PM  Result Value Ref Range   WBC 10.9 (H) 4.0 - 10.5 K/uL   RBC 4.48 4.22 - 5.81 MIL/uL   Hemoglobin 13.1 13.0 - 17.0 g/dL   HCT 37.6 (L) 39.0 - 52.0 %   MCV 83.9 78.0 - 100.0 fL   MCH 29.2 26.0 - 34.0 pg   MCHC 34.8 30.0 - 36.0 g/dL   RDW 14.5 11.5 - 15.5 %   Platelets 244 150 - 400 K/uL  Troponin I     Status: Abnormal   Collection Time: 08/22/17 10:36 PM  Result Value Ref Range   Troponin I 1.14 (HH) <0.03 ng/mL   Dg Chest 2 View  Result Date: 08/22/2017 CLINICAL DATA:  62 year old male with chest pain. EXAM: CHEST - 2 VIEW COMPARISON:  Chest radiograph dated 03/06/2017 FINDINGS: The heart size and mediastinal contours are within normal limits. Both lungs are clear. The visualized skeletal structures are unremarkable. IMPRESSION: No active cardiopulmonary disease. Electronically Signed   By: Anner Crete M.D.   On: 08/22/2017 23:23    ASSESSMENT: Shane Wood is a 62 y.o. male w/ several CAD risk factors presenting with chest pain, found to have NSTEMI. Will plan for coronary angiogram in AM.   PLAN/DISCUSSION: #) NSTEMI - repeat troponin - NPO for cath in AM - echo in AM - ASA 324mg  then 81mg  daily - heparin drip for ACS per pharmacy protocol - atorvastatin 80mg  QHS - start  metoprolol 12.5mg  BID - start ACE in AM pending blood pressures - SLN, nitro gtt PRN - defer P2Y12 until after cath - cardiac rehab ordered  Marcie Mowers, MD Cardiology Fellow, PGY-5

## 2017-08-23 NOTE — Consult Note (Signed)
Reason for Consult possible compartment syndrome right forearm Referring Physician: Dr. Juanda Crumble fields NTHONY Wood is an 62 y.o. male.  HPI: Patient is a 62 year old male who underwent stent placement to the radial artery earlier today who in the recovery room had worsening pain in his forearm.  A compression dressing was applied.  Patient had symptoms consistent with a possible compartment syndrome.  The symptoms got significantly better with removal of the pressure dressing.  Patient still has some mild discomfort in the forearm on exam  Past Medical History:  Diagnosis Date  . Allergy   . BACK PAIN   . ELEVATED BP READING WITHOUT DX HYPERTENSION 05/03/2008   Qualifier: Diagnosis of  By: Larose Kells MD, [place] GERD (gastroesophageal reflux disease)   . H/O cardiovascular stress test 2005    (-)  . Hyperlipidemia   . INSOMNIA-SLEEP DISORDER-UNSPEC   . PVC (premature ventricular contraction)     Past Surgical History:  Procedure Laterality Date  . CHOLECYSTECTOMY    . CORONARY STENT INTERVENTION N/A 08/23/2017   Procedure: CORONARY STENT INTERVENTION;  Surgeon: Jettie Booze, MD;  Location: [place] [facility];  Service: Cardiovascular;  Laterality: N/A;  . LEFT HEART CATH AND CORONARY ANGIOGRAPHY N/A 08/23/2017   Procedure: LEFT HEART CATH AND CORONARY ANGIOGRAPHY;  Surgeon: Jettie Booze, MD;  Location: [place] [facility];  Service: Cardiovascular;  Laterality: N/A;  . POLYPECTOMY    . TONSILLECTOMY     age 43 y/o    Family History  Problem Relation Age of Onset  . Lung cancer Father        smoker  . Diabetes Maternal Grandmother   . Diabetes Maternal Grandfather   . Hypertension Mother   . Depression Mother   . Obesity Mother   . Heart attack Other        uncle MI at age 22s  . Stroke Other        GM, in her 68  . Colon cancer Neg Hx   . Prostate cancer Neg Hx   . Colon polyps Neg Hx   . Esophageal cancer Neg Hx   . Rectal cancer Neg Hx   . Stomach cancer  Neg Hx   . Pancreatic cancer Neg Hx     Social History:  reports that he quit smoking about 28 years ago. He quit smokeless tobacco use about 49 years ago. His smokeless tobacco use included chew. He reports that he drinks about 2.4 oz of alcohol per week. He reports that he does not use drugs.  Allergies: No Known Allergies  Medications:  Scheduled: . [MAR Hold] aspirin EC  81 mg Oral Daily  . [MAR Hold] atorvastatin  80 mg Oral q1800  . [MAR Hold] metoprolol tartrate  12.5 mg Oral BID  . [MAR Hold] pantoprazole  40 mg Oral Daily  . [MAR Hold] sodium chloride flush  3 mL Intravenous Q12H  . [MAR Hold] ticagrelor  90 mg Oral BID    Results for orders placed or performed during the hospital encounter of 08/22/17 (from the past 48 hour(s))  Basic metabolic panel     Status: Abnormal   Collection Time: 08/22/17 10:36 PM  Result Value Ref Range   Sodium 136 135 - 145 mmol/L   Potassium 3.7 3.5 - 5.1 mmol/L   Chloride 106 101 - 111 mmol/L   CO2 21 (L) 22 - 32 mmol/L   Glucose, Bld 108 (H) 65 - 99 mg/dL  BUN 27 (H) 6 - 20 mg/dL   Creatinine, Ser 0.99 0.61 - 1.24 mg/dL   Calcium 9.0 8.9 - 10.3 mg/dL   GFR calc non Af Amer >60 >60 mL/min   GFR calc Af Amer >60 >60 mL/min    Comment: (NOTE) The eGFR has been calculated using the CKD EPI equation. This calculation has not been validated in all clinical situations. eGFR's persistently <60 mL/min signify possible Chronic Kidney Disease.    Anion gap 9 5 - 15    Comment: Performed at [facility], [address] [place], [place] [address]  CBC     Status: Abnormal   Collection Time: 08/22/17 10:36 PM  Result Value Ref Range   WBC 10.9 (H) 4.0 - 10.5 K/uL   RBC 4.48 4.22 - 5.81 MIL/uL   Hemoglobin 13.1 13.0 - 17.0 g/dL   HCT 37.6 (L) 39.0 - 52.0 %   MCV 83.9 78.0 - 100.0 fL   MCH 29.2 26.0 - 34.0 pg   MCHC 34.8 30.0 - 36.0 g/dL   RDW 14.5 11.5 - 15.5 %   Platelets 244 150 - 400 K/uL    Comment: Performed at [facility], [address] [place], [place] [address]  Troponin I     Status: Abnormal   Collection Time: 08/22/17 10:36 PM  Result Value Ref Range   Troponin I 1.14 (HH) <0.03 ng/mL    Comment: CRITICAL RESULT CALLED TO, READ BACK BY AND VERIFIED WITH: CONNOR,K,RN @ 2326 08/22/17 BY GWYN,P Performed at [facility], [address] [place], [place] [address]   MRSA PCR Screening     Status: None   Collection Time: 08/23/17  2:21 AM  Result Value Ref Range   MRSA by PCR NEGATIVE NEGATIVE    Comment:        The GeneXpert MRSA Assay (FDA approved for NASAL specimens only), is one component of a comprehensive MRSA colonization surveillance program. It is not intended to diagnose MRSA infection nor to guide or monitor treatment for MRSA infections. Performed at [facility], [place] [address], [place], [place] [address]   HIV antibody (Routine Testing)     Status: None   Collection Time: 08/23/17  3:39 AM  Result Value Ref Range   HIV Screen 4th Generation wRfx Non Reactive Non Reactive    Comment: (NOTE) Performed At: [facility] [address] [place], [place] [id number] Rush Farmer MD [id number] Performed at [facility], [place] [address], [place], [place] [address]   Troponin I     Status: Abnormal   Collection Time: 08/23/17  3:39 AM  Result Value Ref Range   Troponin I 3.64 (HH) <0.03 ng/mL    Comment: CRITICAL RESULT CALLED TO, READ BACK BY AND VERIFIED WITH: Benito Mccreedy RN [id number] [facility] Performed at [facility], [place] [address], [place], [place] [address]   Basic metabolic panel     Status: Abnormal   Collection Time: 08/23/17  3:39 AM  Result Value Ref Range   Sodium 140 135 - 145 mmol/L   Potassium 4.4 3.5 - 5.1 mmol/L   Chloride 106 101 - 111 mmol/L   CO2 27 22 - 32 mmol/L   Glucose, Bld 108 (H) 65 - 99 mg/dL   BUN 25 (H) 6 - 20 mg/dL   Creatinine, Ser 1.08 0.61 - 1.24 mg/dL   Calcium 9.1 8.9 - 10.3  mg/dL  GFR calc non Af Amer >60 >60 mL/min   GFR calc Af Amer >60 >60 mL/min    Comment: (NOTE) The eGFR has been calculated using the CKD EPI equation. This calculation has not been validated in all clinical situations. eGFR's persistently <60 mL/min signify possible Chronic Kidney Disease.    Anion gap 7 5 - 15    Comment: Performed at [facility] [address] [place], [place] [address]  CBC     Status: Abnormal   Collection Time: 08/23/17  3:39 AM  Result Value Ref Range   WBC 9.0 4.0 - 10.5 K/uL   RBC 4.43 4.22 - 5.81 MIL/uL   Hemoglobin 12.3 (L) 13.0 - 17.0 g/dL   HCT 37.9 (L) 39.0 - 52.0 %   MCV 85.6 78.0 - 100.0 fL   MCH 27.8 26.0 - 34.0 pg   MCHC 32.5 30.0 - 36.0 g/dL   RDW 14.5 11.5 - 15.5 %   Platelets 236 150 - 400 K/uL    Comment: Performed at [facility], [place] [address] [place], [place] [address]  Heparin level (unfractionated)     Status: None   Collection Time: 08/23/17  8:07 AM  Result Value Ref Range   Heparin Unfractionated 0.60 0.30 - 0.70 IU/mL    Comment:        IF HEPARIN RESULTS ARE BELOW EXPECTED VALUES, AND PATIENT DOSAGE HAS BEEN CONFIRMED, SUGGEST FOLLOW UP TESTING OF ANTITHROMBIN III LEVELS. Performed at [facility], [place] [address] [place], [place] [address]   POCT Activated clotting time     Status: None   Collection Time: 08/23/17  9:23 AM  Result Value Ref Range   Activated Clotting Time 351 seconds  POCT Activated clotting time     Status: None   Collection Time: 08/23/17  9:49 AM  Result Value Ref Range   Activated Clotting Time 268 seconds  POCT Activated clotting time     Status: None   Collection Time: 08/23/17 10:01 AM  Result Value Ref Range   Activated Clotting Time 351 seconds    Dg Chest 2 View  Result Date: 08/22/2017 CLINICAL DATA:  62 year old male with chest pain. EXAM: CHEST - 2 VIEW COMPARISON:  Chest radiograph dated 03/06/2017 FINDINGS: The heart size and mediastinal contours are within  normal limits. Both lungs are clear. The visualized skeletal structures are unremarkable. IMPRESSION: No active cardiopulmonary disease. Electronically Signed   By: Anner Crete M.D.   On: 08/22/2017 23:23    Review of Systems  All other systems reviewed and are negative.  Blood pressure 98/63, pulse (!) 56, temperature 98.7 F (37.1 C), temperature source Oral, resp. rate 17, height 5' 10"  (1.778 m), weight 96.7 kg (213 lb 3 oz), SpO2 97 %. Physical Exam  Constitutional: He is oriented to person, place, and time. He appears well-developed and well-nourished.  HENT:  Head: Normocephalic and atraumatic.  Neck: Normal range of motion.  Cardiovascular: Normal rate and regular rhythm.  Respiratory: Effort normal.  Musculoskeletal:       Right forearm: He exhibits tenderness and swelling.  Right forearm swelling status post stent placement to radial artery with initial numbness and tingling in the hand with subsequent removal of pressure dressing and significant decrease in numbness and tingling and forearm swelling.  Neurological: He is alert and oriented to person, place, and time.  Skin: Skin is warm.  Psychiatric: He has a normal mood and affect. His behavior is normal. Judgment and  thought content normal.    Assessment/Plan: 62 year old male with possible injury to the radial artery status post stent placement earlier today with possible compartment syndrome.  Recommend expiration of wound with vascular surgery to assess the radial artery and volar forearm compartments Sheral Apley [facility] 08/23/2017, 6:31 PM

## 2017-08-23 NOTE — Interval H&P Note (Signed)
Cath Lab Visit (complete for each Cath Lab visit)  Clinical Evaluation Leading to the Procedure:   ACS: Yes.    Non-ACS:    Anginal Classification: CCS IV  Anti-ischemic medical therapy: Minimal Therapy (1 class of medications)  Non-Invasive Test Results: No non-invasive testing performed  Prior CABG: No previous CABG      History and Physical Interval Note:  08/23/2017 8:37 AM  Shane Wood  has presented today for surgery, with the diagnosis of NSTEMI  The various methods of treatment have been discussed with the patient and family. After consideration of risks, benefits and other options for treatment, the patient has consented to  Procedure(s): LEFT HEART CATH AND CORONARY ANGIOGRAPHY (N/A) as a surgical intervention .  The patient's history has been reviewed, patient examined, no change in status, stable for surgery.  I have reviewed the patient's chart and labs.  Questions were answered to the patient's satisfaction.     Larae Grooms

## 2017-08-23 NOTE — Progress Notes (Signed)
CRITICAL VALUE ALERT  Critical Value:  Troponin 3.64  Date & Time Notied: 08/23/17 @ 9396  Provider Notified: [facility]  Orders Received/Actions taken: Awaiting

## 2017-08-23 NOTE — Consult Note (Signed)
Referring Physician: Irish Lack  Patient name: Shane Wood MRN: [id number] DOB: 03-20-1956 Sex: male  REASON FOR CONSULT: right forearm pain/ischemia post cath  HPI: Shane Wood is a 62 y.o. male s/p right radial cath around 10 30.  Began to complain of pain in forearm and weakness in hand around 2 pm.  He currently has difficulty moving the hand with numbness.  He had a coronary intervention for non STEMI and is currently on Brilinta.   Past Medical History:  Diagnosis Date  . Allergy   . BACK PAIN   . ELEVATED BP READING WITHOUT DX HYPERTENSION 05/03/2008   Qualifier: Diagnosis of  By: Larose Kells MD, [place] GERD (gastroesophageal reflux disease)   . H/O cardiovascular stress test 2005    (-)  . Hyperlipidemia   . INSOMNIA-SLEEP DISORDER-UNSPEC   . PVC (premature ventricular contraction)    Past Surgical History:  Procedure Laterality Date  . CHOLECYSTECTOMY    . CORONARY STENT INTERVENTION N/A 08/23/2017   Procedure: CORONARY STENT INTERVENTION;  Surgeon: Jettie Booze, MD;  Location: [place] [facility];  Service: Cardiovascular;  Laterality: N/A;  . LEFT HEART CATH AND CORONARY ANGIOGRAPHY N/A 08/23/2017   Procedure: LEFT HEART CATH AND CORONARY ANGIOGRAPHY;  Surgeon: Jettie Booze, MD;  Location: [place] [facility];  Service: Cardiovascular;  Laterality: N/A;  . POLYPECTOMY    . TONSILLECTOMY     age 44 y/o    Family History  Problem Relation Age of Onset  . Lung cancer Father        smoker  . Diabetes Maternal Grandmother   . Diabetes Maternal Grandfather   . Hypertension Mother   . Depression Mother   . Obesity Mother   . Heart attack Other        uncle MI at age 22s  . Stroke Other        GM, in her 47  . Colon cancer Neg Hx   . Prostate cancer Neg Hx   . Colon polyps Neg Hx   . Esophageal cancer Neg Hx   . Rectal cancer Neg Hx   . Stomach cancer Neg Hx   . Pancreatic cancer Neg Hx     SOCIAL HISTORY: Social History   Socioeconomic  History  . Marital status: Married    Spouse name: [name]  . Number of children: 2  . Years of education: Not on file  . Highest education level: Not on file  Occupational History  . Occupation: finances , used to work for [employer]  . Financial resource strain: Not on file  . Food insecurity:    Worry: Not on file    Inability: Not on file  . Transportation needs:    Medical: Not on file    Non-medical: Not on file  Tobacco Use  . Smoking status: Former Smoker    Last attempt to quit: 09/24/1988    Years since quitting: 28.9  . Smokeless tobacco: Former Systems developer    Types: Chew    Quit date: 1970  Substance and Sexual Activity  . Alcohol use: Yes    Alcohol/week: 2.4 oz    Types: 4 Glasses of wine per week    Comment: occasionally on weekends  . Drug use: No  . Sexual activity: Not on file  Lifestyle  . Physical activity:    Days per week: Not on file    Minutes per session: Not on file  .  Stress: Not on file  Relationships  . Social connections:    Talks on phone: Not on file    Gets together: Not on file    Attends religious service: Not on file    Active member of club or organization: Not on file    Attends meetings of clubs or organizations: Not on file    Relationship status: Not on file  . Intimate partner violence:    Fear of current or ex partner: Not on file    Emotionally abused: Not on file    Physically abused: Not on file    Forced sexual activity: Not on file  Other Topics Concern  . Not on file  Social History Narrative   Household pt and wife    2 adult married children    No Known Allergies  Current Facility-Administered Medications  Medication Dose Route Frequency Provider Last Rate Last Dose  . 0.9 %  sodium chloride infusion  250 mL Intravenous PRN Jettie Booze, MD      . acetaminophen (TYLENOL) tablet 650 mg  650 mg Oral Q4H PRN Jettie Booze, MD   650 mg at 08/23/17 1151  . [START ON 08/24/2017] aspirin EC tablet 81 mg   81 mg Oral Daily Jettie Booze, MD      . atorvastatin (LIPITOR) tablet 80 mg  80 mg Oral q1800 Jettie Booze, MD      . hydrALAZINE (APRESOLINE) injection 5 mg  5 mg Intravenous Q20 Min PRN Jettie Booze, MD      . labetalol (NORMODYNE,TRANDATE) injection 10 mg  10 mg Intravenous Q10 min PRN Jettie Booze, MD      . metoprolol tartrate (LOPRESSOR) tablet 12.5 mg  12.5 mg Oral BID Larae Grooms S, MD      . morphine 2 MG/ML injection 2 mg  2 mg Intravenous Q4H PRN Jettie Booze, MD   2 mg at 08/23/17 1504  . nitroGLYCERIN (NITROSTAT) SL tablet 0.4 mg  0.4 mg Sublingual Q5 Min x 3 PRN Larae Grooms S, MD      . ondansetron [facility]) injection 4 mg  4 mg Intravenous Q6H PRN Jettie Booze, MD   4 mg at 08/23/17 1540  . pantoprazole (PROTONIX) EC tablet 40 mg  40 mg Oral Daily Larae Grooms S, MD      . sodium chloride flush (NS) 0.9 % injection 3 mL  3 mL Intravenous Q12H Larae Grooms S, MD      . sodium chloride flush (NS) 0.9 % injection 3 mL  3 mL Intravenous PRN Jettie Booze, MD      . ticagrelor [facility]) tablet 90 mg  90 mg Oral BID Jacolyn Reedy, MD        Physical Examination  Vitals:   08/23/17 1200 08/23/17 1230 08/23/17 1300 08/23/17 1330  BP: 113/68 103/87 108/67 116/63  Pulse:  (!) 55 (!) 56   Resp: 16 20 18 16   Temp:      TempSrc:      SpO2:  100% 99%   Weight:      Height:        Body mass index is 30.59 kg/m.  General:  Alert and oriented, no acute distress Skin: No rash Extremity Pulses:  Non palpable right radial or ulnar pulse, puncture site over radial at wrist no obvious hematoma, full feeling over carpal tunnel, edema diffusely throughout the forearm circumferentially to the elbow, I removed a coban pressure dressing  on the forearm.  This restored radial and ulnar doppler flow that apparently was not present before my arrival Neurologic: hand intrinsics minimal flexion of middle 3 digits no  real movement of thumb or 5th digit, diffuse decreased sensation in hand unable to fully extend wrist or opposed digits  DATA:  CBC    Component Value Date/Time   WBC 9.0 08/23/2017 0339   RBC 4.43 08/23/2017 0339   HGB 12.3 (L) 08/23/2017 0339   HCT 37.9 (L) 08/23/2017 0339   PLT 236 08/23/2017 0339   MCV 85.6 08/23/2017 0339   MCH 27.8 08/23/2017 0339   MCHC 32.5 08/23/2017 0339   RDW 14.5 08/23/2017 0339   LYMPHSABS 1.7 07/15/2017 0828   MONOABS 0.4 07/15/2017 0828   EOSABS 0.2 07/15/2017 0828   BASOSABS 0.0 07/15/2017 0828    BMET    Component Value Date/Time   NA 140 08/23/2017 0339   NA 142 08/08/2017 1016   K 4.4 08/23/2017 0339   CL 106 08/23/2017 0339   CO2 27 08/23/2017 0339   GLUCOSE 108 (H) 08/23/2017 0339   BUN 25 (H) 08/23/2017 0339   BUN 24 08/08/2017 1016   CREATININE 1.08 08/23/2017 0339   CALCIUM 9.1 08/23/2017 0339   GFRNONAA >60 08/23/2017 0339   GFRAA >60 08/23/2017 0339     ASSESSMENT:  Right hand neuro deficit most likely compartment syndrome forearm secondary to subfascial hematoma   PLAN:  Will explore radial artery in OR.  Have discussed case with Dr Harl Favor who will also participate in OR in the event fasciotomy is necessary    Ruta Hinds, MD [facility]: [phone] Pager: [phone]

## 2017-08-23 NOTE — Progress Notes (Signed)
Called lindsay roberts NP regarding unable to get doppler pulse on rt radial. Will come look at it. Dr fields notified by dr Irish Lack

## 2017-08-23 NOTE — Progress Notes (Signed)
Pt resting comfortably with wife at bedside. No complaints of pain. Wife provided pt food. Will continue to monitor.

## 2017-08-23 NOTE — Anesthesia Preprocedure Evaluation (Signed)
Anesthesia Evaluation  Patient identified by MRN, date of birth, ID band Patient awake    Reviewed: Allergy & Precautions, H&P , NPO status , Patient's Chart, lab work & pertinent test results  Airway Mallampati: II   Neck ROM: full    Dental   Pulmonary former smoker,    breath sounds clear to auscultation       Cardiovascular + Past MI   Rhythm:regular Rate:Normal  Pt is s/p cath and coronary stents (x3) today for NSTEMI.  EF 50-55%.   Neuro/Psych PSYCHIATRIC DISORDERS Anxiety    GI/Hepatic GERD  ,  Endo/Other    Renal/GU      Musculoskeletal   Abdominal   Peds  Hematology   Anesthesia Other Findings   Reproductive/Obstetrics                             Anesthesia Physical Anesthesia Plan  ASA: III and emergent  Anesthesia Plan: General   Post-op Pain Management:    Induction: Intravenous  PONV Risk Score and Plan: 2 and Ondansetron, Dexamethasone, Midazolam and Treatment may vary due to age or medical condition  Airway Management Planned: Oral ETT  Additional Equipment:   Intra-op Plan:   Post-operative Plan: Extubation in OR  Informed Consent: I have reviewed the patients History and Physical, chart, labs and discussed the procedure including the risks, benefits and alternatives for the proposed anesthesia with the patient or authorized representative who has indicated his/her understanding and acceptance.     Plan Discussed with: CRNA, Anesthesiologist and Surgeon  Anesthesia Plan Comments:         Anesthesia Quick Evaluation

## 2017-08-23 NOTE — Progress Notes (Addendum)
Progress Note  Patient Name: Shane Wood Date of Encounter: 08/23/2017  Primary Cardiologist: No primary care provider on file.  Subjective   Feeling well this morning, no chest pain.   Inpatient Medications    Scheduled Meds: . [START ON 08/24/2017] aspirin EC  81 mg Oral Daily  . atorvastatin  80 mg Oral q1800  . metoprolol tartrate  12.5 mg Oral BID  . pantoprazole  40 mg Oral Daily   Continuous Infusions: . heparin 1,300 Units/hr (08/23/17 0600)   PRN Meds: acetaminophen, nitroGLYCERIN, ondansetron (ZOFRAN) IV   Vital Signs    Vitals:   08/23/17 0039 08/23/17 0155 08/23/17 0300 08/23/17 0531  BP: (!) 101/57 104/67 119/81   Pulse:  67 (!) 58 60  Resp:  17 18   Temp:  97.9 F (36.6 C) (!) 97.5 F (36.4 C)   TempSrc:  Oral Oral   SpO2:  98% 94%   Weight:  213 lb 3 oz (96.7 kg)    Height:  5\' 10"  (1.778 m)      Intake/Output Summary (Last 24 hours) at 08/23/2017 0751 Last data filed at 08/23/2017 0600 Gross per 24 hour  Intake 46.8 ml  Output -  Net 46.8 ml   Filed Weights   08/22/17 2202 08/23/17 0155  Weight: 216 lb (98 kg) 213 lb 3 oz (96.7 kg)    Telemetry    SR - Personally Reviewed  ECG    SR - Personally Reviewed  Physical Exam   General: Well developed, well nourished, male appearing in no acute distress. Head: Normocephalic, atraumatic.  Neck: Supple without bruits, JVD. Lungs:  Resp regular and unlabored, CTA. Heart: RRR, S1, S2, no S3, S4, or murmur; no rub. Abdomen: Soft, non-tender, non-distended with normoactive bowel sounds.  Extremities: No clubbing, cyanosis, edema. Distal pedal pulses are 2+ bilaterally. Neuro: Alert and oriented X 3. Moves all extremities spontaneously. Psych: Normal affect.  Labs    Chemistry Recent Labs  Lab 08/22/17 2236 08/23/17 0339  NA 136 140  K 3.7 4.4  CL 106 106  CO2 21* 27  GLUCOSE 108* 108*  BUN 27* 25*  CREATININE 0.99 1.08  CALCIUM 9.0 9.1  GFRNONAA >60 >60  GFRAA >60 >60    ANIONGAP 9 7     Hematology Recent Labs  Lab 08/22/17 2236 08/23/17 0339  WBC 10.9* 9.0  RBC 4.48 4.43  HGB 13.1 12.3*  HCT 37.6* 37.9*  MCV 83.9 85.6  MCH 29.2 27.8  MCHC 34.8 32.5  RDW 14.5 14.5  PLT 244 236    Cardiac Enzymes Recent Labs  Lab 08/22/17 2236 08/23/17 0339  TROPONINI 1.14* 3.64*   No results for input(s): TROPIPOC in the last 168 hours.   BNPNo results for input(s): BNP, PROBNP in the last 168 hours.   DDimer No results for input(s): DDIMER in the last 168 hours.    Radiology    Dg Chest 2 View  Result Date: 08/22/2017 CLINICAL DATA:  62 year old male with chest pain. EXAM: CHEST - 2 VIEW COMPARISON:  Chest radiograph dated 03/06/2017 FINDINGS: The heart size and mediastinal contours are within normal limits. Both lungs are clear. The visualized skeletal structures are unremarkable. IMPRESSION: No active cardiopulmonary disease. Electronically Signed   By: Anner Crete M.D.   On: 08/22/2017 23:23    Cardiac Studies   N/a  Patient Profile     62 y.o. male with PMH of HL, GERD who presented with chest pain and found to have  a positive troponin.   Assessment & Plan    1. NSTEMI: trop up to 3.64. Remains on IV heparin. No chest pain this morning. Discussed cath with patient and his wife. He is agreeable. Will plan for cath today.  - The patient understands that risks included but are not limited to stroke (1 in 1000), death (1 in 44), kidney failure [usually temporary] (1 in 500), bleeding (1 in 200), allergic reaction [possibly serious] (1 in 200).  - on ASA, BB, statin  2. HL: was on Lipitor 20mg  at home which has been increased to 80mg .   Signed, Reino Bellis, NP  08/23/2017, 7:51 AM  Pager # [phone]   Patient seen, examined. Available data reviewed. Agree with findings, assessment, and plan as outlined by Reino Bellis, NP.  The patient is independently interviewed and examined.  Heart is regular rate and rhythm, lungs are  clear, abdomen is soft nontender, extremities show no peripheral edema.  I have reviewed lab data, radiographic data, and EKG.  He has typical symptoms and troponin elevation consistent with non-STEMI.  He has been chest pain-free on IV heparin since arrival in the hospital.  His only complaint this morning is a headache that he attributes to not drinking coffee.  I agree that cardiac catheterization and possible angioplasty/stenting is clearly indicated.  I have reviewed the risks, indications, and alternatives at length with the patient who understands and agrees to proceed.  He has no contraindication to dual antiplatelet therapy and specifically denies any past history of bleeding problems or blood transfusion.  Sherren Mocha, M.D. 08/23/2017 8:30 AM   For questions or updates, please contact [place] Please consult [email or web address] for contact info under Cardiology/STEMI.

## 2017-08-23 NOTE — Anesthesia Procedure Notes (Signed)
Date/Time: 08/23/2017 5:49 PM Performed by: Cleda Daub, CRNA

## 2017-08-23 NOTE — Plan of Care (Signed)
Continue current care plan 

## 2017-08-23 NOTE — Progress Notes (Addendum)
    Called to the bedside by RN stating the patient is having numbness and decreased sensation in the right hand post intervention. On assessment hand is discolored, cool to touch and painful. Also noted swelling into the forearm that is painful to touch as well. TR band in place but air had been removed. Able to palpate a brachial pulse with weak radial pulse. Called cath lab tech to the bedside to assess site. Coban placed from elbow to wrist and elevated. Initial color improvement in hand along with sensation but then discoloration again. Dr. Irish Lack informed and to the bedside to assess site.   Barnet Pall, NP-C 08/23/2017, 3:04 PM Pager: [phone]   Addendum: [facility] consult called by Dr. Irish Lack given no improvement in sensation.    I have examined the patient and reviewed assessment and plan and discussed with patient.  Agree with above as stated.  I saw the patient as he was continuing to complain of pain in his forarm and numbness in his hand.  He stated that the forearm pain started shortly after he got to the room and I came to talk to him postprocedure.  At that time, arm was stable.  Radial site intact.  At this time,  A compression wrap was on the forearm, his fingers are discolored and it is difficult to feel a radial pulse manually. 3+ right brachial pulse.  He did not call for assistance because he thought his arm was supposed to hurt post procedure.  We did have trouble navigating the radial at the level of the elbow and above.  Wire had to be redirected under fluoroscopy to the main channel.  No issues at the radial site or below the elbow during the procedure.  I spoke to Dr. Oneida Alar and he will evaluate the patient given his symptoms in the setting of forearm hematoma.  IV Anticoagulation and antiplatelet therapy have been off for several hours, but his antiplatelet therapy is likely still has some effect, as he was loaded with Brilinta due to the inferior MI.   Larae Grooms

## 2017-08-23 NOTE — Transfer of Care (Signed)
Immediate Anesthesia Transfer of Care Note  Patient: Shane Wood  Procedure(s) Performed: EXPLORATION RIGHT RADIAL ARTERY,  RIGHT FOREARM FASCIOTOMY (Right Arm Lower)  Patient Location: PACU  Anesthesia Type:General  Level of Consciousness: awake, alert , oriented and patient cooperative  Airway & Oxygen Therapy: Patient Spontanous Breathing and Patient connected to face mask oxygen  Post-op Assessment: Report given to RN and Post -op Vital signs reviewed and stable  Post vital signs: Reviewed and stable  Last Vitals:  Vitals Value Taken Time  BP 127/72 08/23/2017  6:37 PM  Temp    Pulse 74 08/23/2017  6:39 PM  Resp 17 08/23/2017  6:39 PM  SpO2 98 % 08/23/2017  6:39 PM  Vitals shown include unvalidated device data.  Last Pain:  Vitals:   08/23/17 1600  TempSrc: Oral  PainSc: 10-Worst pain ever      Patients Stated Pain Goal: 2 (22/33/61 2244)  Complications: No apparent anesthesia complications

## 2017-08-23 NOTE — H&P (View-Only) (Signed)
Progress Note  Patient Name: Shane Wood Date of Encounter: 08/23/2017  Primary Cardiologist: No primary care provider on file.  Subjective   Feeling well this morning, no chest pain.   Inpatient Medications    Scheduled Meds: . [START ON 08/24/2017] aspirin EC  81 mg Oral Daily  . atorvastatin  80 mg Oral q1800  . metoprolol tartrate  12.5 mg Oral BID  . pantoprazole  40 mg Oral Daily   Continuous Infusions: . heparin 1,300 Units/hr (08/23/17 0600)   PRN Meds: acetaminophen, nitroGLYCERIN, ondansetron (ZOFRAN) IV   Vital Signs    Vitals:   08/23/17 0039 08/23/17 0155 08/23/17 0300 08/23/17 0531  BP: (!) 101/57 104/67 119/81   Pulse:  67 (!) 58 60  Resp:  17 18   Temp:  97.9 F (36.6 C) (!) 97.5 F (36.4 C)   TempSrc:  Oral Oral   SpO2:  98% 94%   Weight:  213 lb 3 oz (96.7 kg)    Height:  5\' 10"  (1.778 m)      Intake/Output Summary (Last 24 hours) at 08/23/2017 0751 Last data filed at 08/23/2017 0600 Gross per 24 hour  Intake 46.8 ml  Output -  Net 46.8 ml   Filed Weights   08/22/17 2202 08/23/17 0155  Weight: 216 lb (98 kg) 213 lb 3 oz (96.7 kg)    Telemetry    SR - Personally Reviewed  ECG    SR - Personally Reviewed  Physical Exam   General: Well developed, well nourished, male appearing in no acute distress. Head: Normocephalic, atraumatic.  Neck: Supple without bruits, JVD. Lungs:  Resp regular and unlabored, CTA. Heart: RRR, S1, S2, no S3, S4, or murmur; no rub. Abdomen: Soft, non-tender, non-distended with normoactive bowel sounds.  Extremities: No clubbing, cyanosis, edema. Distal pedal pulses are 2+ bilaterally. Neuro: Alert and oriented X 3. Moves all extremities spontaneously. Psych: Normal affect.  Labs    Chemistry Recent Labs  Lab 08/22/17 2236 08/23/17 0339  NA 136 140  K 3.7 4.4  CL 106 106  CO2 21* 27  GLUCOSE 108* 108*  BUN 27* 25*  CREATININE 0.99 1.08  CALCIUM 9.0 9.1  GFRNONAA >60 >60  GFRAA >60 >60    ANIONGAP 9 7     Hematology Recent Labs  Lab 08/22/17 2236 08/23/17 0339  WBC 10.9* 9.0  RBC 4.48 4.43  HGB 13.1 12.3*  HCT 37.6* 37.9*  MCV 83.9 85.6  MCH 29.2 27.8  MCHC 34.8 32.5  RDW 14.5 14.5  PLT 244 236    Cardiac Enzymes Recent Labs  Lab 08/22/17 2236 08/23/17 0339  TROPONINI 1.14* 3.64*   No results for input(s): TROPIPOC in the last 168 hours.   BNPNo results for input(s): BNP, PROBNP in the last 168 hours.   DDimer No results for input(s): DDIMER in the last 168 hours.    Radiology    Dg Chest 2 View  Result Date: 08/22/2017 CLINICAL DATA:  62 year old male with chest pain. EXAM: CHEST - 2 VIEW COMPARISON:  Chest radiograph dated 03/06/2017 FINDINGS: The heart size and mediastinal contours are within normal limits. Both lungs are clear. The visualized skeletal structures are unremarkable. IMPRESSION: No active cardiopulmonary disease. Electronically Signed   By: Anner Crete M.D.   On: 08/22/2017 23:23    Cardiac Studies   N/a  Patient Profile     62 y.o. male with PMH of HL, GERD who presented with chest pain and found to have  a positive troponin.   Assessment & Plan    1. NSTEMI: trop up to 3.64. Remains on IV heparin. No chest pain this morning. Discussed cath with patient and his wife. He is agreeable. Will plan for cath today.  - The patient understands that risks included but are not limited to stroke (1 in 1000), death (1 in 46), kidney failure [usually temporary] (1 in 500), bleeding (1 in 200), allergic reaction [possibly serious] (1 in 200).  - on ASA, BB, statin  2. HL: was on Lipitor 20mg  at home which has been increased to 80mg .   Signed, Reino Bellis, NP  08/23/2017, 7:51 AM  Pager # [phone]   Patient seen, examined. Available data reviewed. Agree with findings, assessment, and plan as outlined by Reino Bellis, NP.  The patient is independently interviewed and examined.  Heart is regular rate and rhythm, lungs are  clear, abdomen is soft nontender, extremities show no peripheral edema.  I have reviewed lab data, radiographic data, and EKG.  He has typical symptoms and troponin elevation consistent with non-STEMI.  He has been chest pain-free on IV heparin since arrival in the hospital.  His only complaint this morning is a headache that he attributes to not drinking coffee.  I agree that cardiac catheterization and possible angioplasty/stenting is clearly indicated.  I have reviewed the risks, indications, and alternatives at length with the patient who understands and agrees to proceed.  He has no contraindication to dual antiplatelet therapy and specifically denies any past history of bleeding problems or blood transfusion.  Sherren Mocha, M.D. 08/23/2017 8:30 AM   For questions or updates, please contact [place] Please consult [email or web address] for contact info under Cardiology/STEMI.

## 2017-08-23 NOTE — Anesthesia Procedure Notes (Signed)
Procedure Name: Intubation Date/Time: 08/23/2017 5:26 PM Performed by: Cleda Daub, CRNA Pre-anesthesia Checklist: Patient identified, Emergency Drugs available, Suction available and Patient being monitored Patient Re-evaluated:Patient Re-evaluated prior to induction Oxygen Delivery Method: Circle system utilized Preoxygenation: Pre-oxygenation with 100% oxygen Induction Type: IV induction Laryngoscope Size: 4 and Glidescope Grade View: Grade I Tube type: Oral Tube size: 7.5 mm Number of attempts: 1 Airway Equipment and Method: Stylet and Video-laryngoscopy Placement Confirmation: ETT inserted through vocal cords under direct vision,  positive ETCO2 and breath sounds checked- equal and bilateral Secured at: 22 cm Tube secured with: Tape Dental Injury: Teeth and Oropharynx as per pre-operative assessment  Comments: Pt was vomiting actively in preop; well preoxygenated before induction; RSI; nothing seen around the glottis on the glidescope screen.

## 2017-08-23 NOTE — Progress Notes (Signed)
Called cath lab to assist pt c/o numbness in hand, noticed Rt forearm area more swollen then Lt forearm. RN from cath lab came over and coban applied, arm elevated on pillows, will continue to monitor and dr Irish Lack will be notified by cath lab RN

## 2017-08-23 NOTE — Progress Notes (Signed)
Pt hand significantly improved after removing coban dressing. Forearm remains tense but he is now moving hand intrinsics and sensation is returning.  4/5 wrist extension flexion  Will still proceed to OR as planned due to forearm swelling  Ruta Hinds, MD [facility] [place]: [phone] Pager: [phone]

## 2017-08-24 ENCOUNTER — Inpatient Hospital Stay (HOSPITAL_COMMUNITY): Payer: BLUE CROSS/BLUE SHIELD

## 2017-08-24 DIAGNOSIS — I340 Nonrheumatic mitral (valve) insufficiency: Secondary | ICD-10-CM

## 2017-08-24 LAB — BASIC METABOLIC PANEL
Anion gap: 7 (ref 5–15)
BUN: 18 mg/dL (ref 6–20)
CO2: 23 mmol/L (ref 22–32)
Calcium: 8.3 mg/dL — ABNORMAL LOW (ref 8.9–10.3)
Chloride: 107 mmol/L (ref 101–111)
Creatinine, Ser: 0.99 mg/dL (ref 0.61–1.24)
GFR calc Af Amer: >60 mL/min (ref >60)
GLUCOSE: 149 mg/dL — AB (ref 65–99)
POTASSIUM: 4.2 mmol/L (ref 3.5–5.1)
Sodium: 137 mmol/L (ref 135–145)

## 2017-08-24 LAB — CBC
HEMATOCRIT: 33.6 % — AB (ref 39.0–52.0)
Hemoglobin: 11 g/dL — ABNORMAL LOW (ref 13.0–17.0)
MCH: 28 pg (ref 26.0–34.0)
MCHC: 32.7 g/dL (ref 30.0–36.0)
MCV: 85.5 fL (ref 78.0–100.0)
PLATELETS: 204 10*3/uL (ref 150–400)
RBC: 3.93 MIL/uL — ABNORMAL LOW (ref 4.22–5.81)
RDW: 14.8 % (ref 11.5–15.5)
WBC: 12.1 10*3/uL — ABNORMAL HIGH (ref 4.0–10.5)

## 2017-08-24 LAB — ECHOCARDIOGRAM COMPLETE
HEIGHTINCHES: 70 in
Weight: 3410.96 oz

## 2017-08-24 MED ORDER — NITROGLYCERIN 0.4 MG SL SUBL: 0.4000 mg | SUBLINGUAL_TABLET | SUBLINGUAL | 11 refills | Status: DC | PRN

## 2017-08-24 MED ORDER — TICAGRELOR 90 MG PO TABS: 90.0000 mg | ORAL_TABLET | Freq: Two times a day (BID) | ORAL | 0 refills | Status: DC

## 2017-08-24 MED ORDER — PERFLUTREN LIPID MICROSPHERE
1.0000 mL | INTRAVENOUS | Status: AC | PRN
  Administered 2017-08-24: 4 mL via INTRAVENOUS
  Filled 2017-08-24: qty 10

## 2017-08-24 MED ORDER — ASPIRIN 81 MG PO TBEC: 81.0000 mg | DELAYED_RELEASE_TABLET | Freq: Every day | ORAL | 11 refills | Status: DC

## 2017-08-24 MED ORDER — HYDROCODONE-ACETAMINOPHEN 5-325 MG PO TABS: 1.0000 | ORAL_TABLET | ORAL | 0 refills | Status: DC | PRN

## 2017-08-24 MED ORDER — TICAGRELOR 90 MG PO TABS: 90.0000 mg | ORAL_TABLET | Freq: Two times a day (BID) | ORAL | 11 refills | Status: DC

## 2017-08-24 MED ORDER — METOPROLOL TARTRATE 25 MG PO TABS: 12.5000 mg | ORAL_TABLET | Freq: Two times a day (BID) | ORAL | 11 refills | Status: DC

## 2017-08-24 MED ORDER — ATORVASTATIN CALCIUM 80 MG PO TABS: 80.0000 mg | ORAL_TABLET | Freq: Every day | ORAL | 11 refills | Status: DC

## 2017-08-24 NOTE — Discharge Summary (Signed)
Discharge Summary    Patient ID: Shane Wood  MRN: [id number], DOB/AGE: 12-27-1955 62 y.o.  Admit Date: 08/22/2017 Discharge Date: 08/24/2017  Primary Care Provider: Colon Branch, MD Primary Cardiologist: Dr. Johnsie Cancel, MD - wants to follow up with Dr. Irish Lack  Discharge Diagnoses    Active Problems:   NSTEMI (non-ST elevated myocardial infarction) [facility])   Allergies No Known Allergies   History of Present Illness     62 year old male with frequent PVCs and HLD who was admitted to [facility] on 5/3 with a NSTEMI.   Prior TTE from 10/15 showed an EF 55-60%, no RWMA, aortic sclerosis, aortic root 39 mm, mildly dilated ascending aorta, mildly dilated left atrium, lipomatous hypertrophy. ETT 10/15 without ischemia. PVCs improved with beta blocker.   Hospital Course     Consultants: vascular surgery, orthopedics, anesthesiology, cardiac rehab   Patient was admitted with a NSTEMI with a peak troponin of 3.64. He underwent LHC via the right radial artery on 08/23/2017 that showed. Post-procedure course was complicated by compartment syndrome of the right forearm. He was evaluated by orthopedics and vascular surgery. He underwent exploration of the right forearm with compartment release of all volar compartments. The radial artery was identified and noted to be intact. The median nerve and ulnar neurovascular bundle were also identified and noted to be intact. The wound was irrigated and loosely closed.   Patient was evaluated by vascular surgery this morning and was doing well. He was noted to be stable for discharge from their standpoint with outpatient follow up in 2 weeks with vascular surgery (they are arranging per vascular note). Vascular surgery has recommended the patient be sent home with hydrocodone for pain. He has been seen by cardiology and felt to be stable for discharge pending echo read. Vitals stable. Discharge CBC with a stable HGB of 11.   The patient's right radial cath site  has been examined and is healing well without issues at this time. The patient has been seen by Dr. Percival Spanish, MD and felt to be stable for discharge today. All follow up appointments have been made. Discharge medications are listed below. Prescriptions have been reviewed with the patient and sent in to their pharmacy.  _____________  Discharge Vitals Blood pressure 124/74, pulse 72, temperature 99.1 F (37.3 C), temperature source Oral, resp. rate 15, height 5' 10"  (1.778 m), weight 213 lb 3 oz (96.7 kg), SpO2 97 %.  Filed Weights   08/22/17 2202 08/23/17 0155  Weight: 216 lb (98 kg) 213 lb 3 oz (96.7 kg)    Labs & Radiologic Studies    CBC Recent Labs    08/23/17 0339 08/24/17 0257  WBC 9.0 12.1*  HGB 12.3* 11.0*  HCT 37.9* 33.6*  MCV 85.6 85.5  PLT 236 924   Basic Metabolic Panel Recent Labs    08/23/17 0339 08/24/17 0257  NA 140 137  K 4.4 4.2  CL 106 107  CO2 27 23  GLUCOSE 108* 149*  BUN 25* 18  CREATININE 1.08 0.99  CALCIUM 9.1 8.3*   Liver Function Tests No results for input(s): AST, ALT, ALKPHOS, BILITOT, PROT, ALBUMIN in the last 72 hours. No results for input(s): LIPASE, AMYLASE in the last 72 hours. Cardiac Enzymes Recent Labs    08/22/17 2236 08/23/17 0339  TROPONINI 1.14* 3.64*   BNP Invalid input(s): POCBNP D-Dimer No results for input(s): DDIMER in the last 72 hours. Hemoglobin A1C No results for input(s): HGBA1C in the last  72 hours. Fasting Lipid Panel No results for input(s): CHOL, HDL, LDLCALC, TRIG, CHOLHDL, LDLDIRECT in the last 72 hours. Thyroid Function Tests No results for input(s): TSH, T4TOTAL, T3FREE, THYROIDAB in the last 72 hours.  Invalid input(s): FREET3 _____________  Dg Chest 2 View  Result Date: 08/22/2017 CLINICAL DATA:  62 year old male with chest pain. EXAM: CHEST - 2 VIEW COMPARISON:  Chest radiograph dated 03/06/2017 FINDINGS: The heart size and mediastinal contours are within normal limits. Both lungs are clear. The  visualized skeletal structures are unremarkable. IMPRESSION: No active cardiopulmonary disease. Electronically Signed   By: Anner Crete M.D.   On: 08/22/2017 23:23    Diagnostic Studies/Procedures   LHC 08/23/2017: Coronary Findings   Diagnostic  Dominance: Right  Left Anterior Descending  Prox LAD lesion 50% stenosed  Prox LAD lesion is 50% stenosed.  Left Circumflex  Mid Cx lesion 80% stenosed  Mid Cx lesion is 80% stenosed. The lesion is eccentric.  First Obtuse Marginal Branch  Collaterals  1st Mrg filled by collaterals from 3rd Mrg.    Ost 1st Mrg lesion 100% stenosed  Ost 1st Mrg lesion is 100% stenosed.  Second Obtuse Marginal Branch  Collaterals  2nd Mrg filled by collaterals from [place] 2nd Mrg to 2nd Mrg lesion 100% stenosed  Ost 2nd Mrg to 2nd Mrg lesion is 100% stenosed.  Right Coronary Artery  Prox RCA lesion 40% stenosed  Prox RCA lesion is 40% stenosed.  Prox RCA to Mid RCA lesion 100% stenosed  Prox RCA to Mid RCA lesion is 100% stenosed.  Mid RCA lesion 75% stenosed  Mid RCA lesion is 75% stenosed.  Right Posterior Descending Artery  Collaterals  RPDA filled by collaterals from 3rd Sept.    Ost RPDA to RPDA lesion 90% stenosed  Ost RPDA to RPDA lesion is 90% stenosed.  Intervention   Prox RCA to Mid RCA lesion  Stent  Lesion crossed with guidewire using a WIRE ASAHI PROWATER 180CM. Pre-stent angioplasty was performed using a BALLOON SAPPHIRE 2.5X15. A drug-eluting stent was successfully placed using a STENT SYNERGY DES 4X12. Maximum pressure: 16 atm. Stent strut is well apposed. Stent overlaps previously placed stent. Post-stent angioplasty was not performed.  Post-Intervention Lesion Assessment  The intervention was successful. Pre-interventional TIMI flow is 0. Post-intervention TIMI flow is 3. No complications occurred at this lesion.  There is no residual stenosis post intervention.  Mid RCA lesion  Stent  Lesion crossed with  guidewire using a WIRE ASAHI PROWATER 180CM. Pre-stent angioplasty was performed using a BALLOON SAPPHIRE 2.5X15. A drug-eluting stent was successfully placed using a STENT SYNERGY DES [id number]. Stent strut is well apposed. Post-stent angioplasty was performed using a BALLOON [place] EMERGE MR 3.25X20.  Post-Intervention Lesion Assessment  The intervention was successful. Pre-interventional TIMI flow is 3. Post-intervention TIMI flow is 3. No complications occurred at this lesion.  There is no residual stenosis post intervention.  Ost RPDA to RPDA lesion  Stent  Lesion crossed with guidewire using a WIRE ASAHI PROWATER 180CM. Pre-stent angioplasty was performed using a BALLOON SAPPHIRE 2.5X15. A drug-eluting stent was successfully placed using a STENT SYNERGY DES 2.5X16. Stent strut is well apposed. Post-stent angioplasty was not performed.  Post-Intervention Lesion Assessment  The intervention was successful. Pre-interventional TIMI flow is 3. Post-intervention TIMI flow is 3. No complications occurred at this lesion.  There is no residual stenosis post intervention.  Wall Motion   Resting  Left Heart   Left Ventricle The left ventricular size is normal. The left ventricular systolic function is normal. LV end diastolic pressure is normal. The left ventricular ejection fraction is 50-55% by visual estimate. There are LV function abnormalities due to segmental dysfunction.  Aortic Valve There is mild aortic valve stenosis. Mild gradient across aortic valve.  Coronary Diagrams   Diagnostic Diagram       Post-Intervention Diagram        Conclusion     Prox RCA lesion is 40% stenosed.  Prox RCA to Mid RCA lesion is 100% stenosed.A drug-eluting stent was successfully placed using a STENT SYNERGY DES 4X12.  Mid RCA lesion is 75% stenosed. A drug-eluting stent was successfully placed using a STENT SYNERGY DES [id number].  THe above stents overlap. Post intervention, there is  a 0% residual stenosis.  Ost RPDA to RPDA lesion is 90% stenosed. A drug-eluting stent was successfully placed using a STENT SYNERGY DES 2.5X16.  Post intervention, there is a 0% residual stenosis.  Ost 1st Mrg lesion is 100% stenosed. Ost 2nd Mrg to 2nd Mrg lesion is 100% stenosed. These small vessels are filled by collaterals.  Mid Cx lesion is 80% stenosed.  Prox LAD lesion is 50% stenosed.  The left ventricular ejection fraction is 50-55% by visual estimate.  The left ventricular systolic function is normal.  LV end diastolic pressure is normal.  There is a mild aortic valve gradient.   Continue dual antiplatelet therapy for at least one year.  Aggressive medical therapy including lipid lowering therapy and healthy diet.  Possible discharge tomorrow.    Prominent accesory radial branch.  Need fluoroscopic guidance to navigate right radial. AL1 guide needed to engage RCA.    TTE pending _____________  Disposition   Pt is being discharged home today in good condition.  Follow-up Plans & Appointments    Follow-up Information    Josue Hector, MD Follow up.   Specialty:  Cardiology Why:  A message has been sent to our office for hospital follow up within 7-14 days.  Contact information: [address] [place] [address] [phone]          Discharge Instructions    AMB Referral to Cardiac Rehabilitation - Phase II   Complete by:  As directed    Diagnosis:   NSTEMI Coronary Stents     Diet - low sodium heart healthy   Complete by:  As directed    Increase activity slowly   Complete by:  As directed       Discharge Medications   Allergies as of 08/24/2017   No Known Allergies     Medication List    TAKE these medications   aspirin 81 MG EC tablet Take 1 tablet (81 mg total) by mouth daily. Start taking on:  08/25/2017   atorvastatin 80 MG tablet Commonly known as:  LIPITOR Take 1 tablet (80 mg total) by mouth daily at 6  PM. What changed:    medication strength  how much to take  when to take this   HYDROcodone-acetaminophen 5-325 MG tablet Commonly known as:  NORCO Take 1 tablet by mouth every 4 (four) hours as needed for moderate pain.   metoprolol tartrate 25 MG tablet Commonly known as:  LOPRESSOR Take 0.5 tablets (12.5 mg total) by mouth 2 (two) times daily.   multivitamin tablet Take 1 tablet by mouth daily.   nitroGLYCERIN 0.4 MG SL tablet Commonly known as:  NITROSTAT  Place 1 tablet (0.4 mg total) under the tongue every 5 (five) minutes x 3 doses as needed for chest pain.   omeprazole 20 MG capsule Commonly known as:  PRILOSEC Take 20 mg by mouth daily.   ticagrelor 90 MG Tabs tablet Commonly known as:  BRILINTA Take 1 tablet (90 mg total) by mouth 2 (two) times daily.   ticagrelor 90 MG Tabs tablet Commonly known as:  BRILINTA Take 1 tablet (90 mg total) by mouth 2 (two) times daily.   VITAMIN B 12 PO Take 1 tablet by mouth daily.   vitamin C 1000 MG tablet Take 1,000 mg by mouth daily.   Vitamin D (Ergocalciferol) 50000 units Caps capsule Commonly known as:  DRISDOL Take 1 capsule (50,000 Units total) by mouth every 7 (seven) days.        Aspirin prescribed at discharge?  Yes High Intensity Statin Prescribed? (Lipitor 40-40m or Crestor 20-481m: Yes Beta Blocker Prescribed? Yes For EF <40%, was ACEI/ARB Prescribed? No: EF > 40% ADP Receptor Inhibitor Prescribed? (i.e. Plavix etc.-Includes Medically Managed Patients): Yes For EF <40%, Aldosterone Inhibitor Prescribed? No: EF > 40% Was EF assessed during THIS hospitalization? Yes Was Cardiac Rehab II ordered? (Included Medically managed Patients): Yes   Outstanding Labs/Studies   None.  Duration of Discharge Encounter   Greater than 30 minutes including physician time.  Signed, RyRise MuPA-C [facility] Pager: ([phone]/07/2017, 11:18 AM

## 2017-08-24 NOTE — Progress Notes (Signed)
Progress Note  Patient Name: Shane Wood Date of Encounter: 08/24/2017  Primary Cardiologist:   No primary care provider on file.   Subjective    No chest pain.  No SOB. Right arm without significant pain.   Inpatient Medications    Scheduled Meds: . aspirin EC  81 mg Oral Daily  . atorvastatin  80 mg Oral q1800  . metoprolol tartrate  12.5 mg Oral BID  . pantoprazole  40 mg Oral Daily  . sodium chloride flush  3 mL Intravenous Q12H  . ticagrelor  90 mg Oral BID   Continuous Infusions: . sodium chloride 10 mL/hr at 08/24/17 0310   PRN Meds: sodium chloride, acetaminophen, morphine injection, nitroGLYCERIN, ondansetron (ZOFRAN) IV, sodium chloride flush   Vital Signs    Vitals:   08/23/17 2300 08/24/17 0300 08/24/17 0304 08/24/17 0707  BP: 125/65  131/70 124/74  Pulse: 73 64 74 72  Resp: 13  15 15   Temp: 98.4 F (36.9 C)  98.7 F (37.1 C) 99.1 F (37.3 C)  TempSrc: Oral  Oral Oral  SpO2: 95% 96% 96% 97%  Weight:      Height:        Intake/Output Summary (Last 24 hours) at 08/24/2017 0948 Last data filed at 08/24/2017 0900 Gross per 24 hour  Intake 1880.81 ml  Output 20 ml  Net 1860.81 ml   Filed Weights   08/22/17 2202 08/23/17 0155  Weight: 216 lb (98 kg) 213 lb 3 oz (96.7 kg)    Telemetry    NSR - Personally Reviewed  ECG    NSR, rate 67, axis WNL, intervals WNL, inferior infarct old with inferior T wave inversion, early transition.  Possible inferior posterior infarct.  - Personally Reviewed  Physical Exam   GEN: No acute distress.   Neck: No  JVD Cardiac: RRR, no murmurs, rubs, or gallops.  Respiratory: Clear  to auscultation bilaterally. GI: Soft, nontender, non-distended  MS:    Right arm bandaged.  Edema. Neuro:  Nonfocal  Psych: Normal affect   Labs    Chemistry Recent Labs  Lab 08/22/17 2236 08/23/17 0339 08/24/17 0257  NA 136 140 137  K 3.7 4.4 4.2  CL 106 106 107  CO2 21* 27 23  GLUCOSE 108* 108* 149*  BUN 27* 25*  18  CREATININE 0.99 1.08 0.99  CALCIUM 9.0 9.1 8.3*  GFRNONAA >60 >60 >60  GFRAA >60 >60 >60  ANIONGAP 9 7 7      Hematology Recent Labs  Lab 08/22/17 2236 08/23/17 0339 08/24/17 0257  WBC 10.9* 9.0 12.1*  RBC 4.48 4.43 3.93*  HGB 13.1 12.3* 11.0*  HCT 37.6* 37.9* 33.6*  MCV 83.9 85.6 85.5  MCH 29.2 27.8 28.0  MCHC 34.8 32.5 32.7  RDW 14.5 14.5 14.8  PLT 244 236 204    Cardiac Enzymes Recent Labs  Lab 08/22/17 2236 08/23/17 0339  TROPONINI 1.14* 3.64*   No results for input(s): TROPIPOC in the last 168 hours.   BNPNo results for input(s): BNP, PROBNP in the last 168 hours.   DDimer No results for input(s): DDIMER in the last 168 hours.   Radiology    Dg Chest 2 View  Result Date: 08/22/2017 CLINICAL DATA:  62 year old male with chest pain. EXAM: CHEST - 2 VIEW COMPARISON:  Chest radiograph dated 03/06/2017 FINDINGS: The heart size and mediastinal contours are within normal limits. Both lungs are clear. The visualized skeletal structures are unremarkable. IMPRESSION: No active cardiopulmonary disease. Electronically Signed  By: Anner Crete M.D.   On: 08/22/2017 23:23    Cardiac Studies   CARDIAC CATH 09/23/17  Conclusion     Prox RCA lesion is 40% stenosed.  Prox RCA to Mid RCA lesion is 100% stenosed.A drug-eluting stent was successfully placed using a STENT SYNERGY DES 4X12.  Mid RCA lesion is 75% stenosed. A drug-eluting stent was successfully placed using a STENT SYNERGY DES [id number].  THe above stents overlap. Post intervention, there is a 0% residual stenosis.  Ost RPDA to RPDA lesion is 90% stenosed. A drug-eluting stent was successfully placed using a STENT SYNERGY DES 2.5X16.  Post intervention, there is a 0% residual stenosis.  Ost 1st Mrg lesion is 100% stenosed. Ost 2nd Mrg to 2nd Mrg lesion is 100% stenosed. These small vessels are filled by collaterals.  Mid Cx lesion is 80% stenosed.  Prox LAD lesion is 50% stenosed.  The left  ventricular ejection fraction is 50-55% by visual estimate.  The left ventricular systolic function is normal.  LV end diastolic pressure is normal.  There is a mild aortic valve gradient.   Continue dual antiplatelet therapy for at least one year.  Aggressive medical therapy including lipid lowering therapy and healthy diet.  Possible discharge tomorrow.    Prominent accesory radial branch.  Need fluoroscopic guidance to navigate right radial. AL1 guide needed to engage RCA.     Patient Profile     62 y.o. male with PMH of HL, GERD who presented with chest pain and found to have a positive troponin.    Assessment & Plan    COMPARTMENT SYNDROME:   Compartment release completed.  OK to send home per [facility] .  Send home with a few Vicodin for pain management  NSTEMI:   Status post multiple stents as above.  Echo pending.     OK to discharge with meds as listed.  He wants to follow with Dr. Irish Lack  DYSLIPIDEMIA:  Lipitor as listed.    For questions or updates, please contact [place] Please consult [email or web address] for contact info under Cardiology/STEMI.   Signed, Minus Breeding, MD  08/24/2017, 9:48 AM

## 2017-08-24 NOTE — Progress Notes (Signed)
Discharged home accompanied by wife, discharge instructions and prescription given to pt. Belongings taken home.

## 2017-08-24 NOTE — Op Note (Signed)
NAME: Shane Wood, Shane Wood. MEDICAL RECORD TU:[id number] ACCOUNT [id number] DATE OF BIRTH:Nov 06, 1955 FACILITY: [facility] LOCATION: [facility] PHYSICIAN:Welden Hausmann A. Burney Gauze, MD  OPERATIVE REPORT  DATE OF PROCEDURE:  08/23/2017  PREOPERATIVE DIAGNOSES:  Right forearm pain and swelling with possible compartment syndrome and radial artery injury.  POSTOPERATIVE DIAGNOSES:  Right forearm pain and swelling with possible compartment syndrome and radial artery injury.    PROCEDURE:  Exploration of right forearm with compartment releases of volar compartments, right forearm.  SURGEON:  Charlotte Crumb, MD  ASSISTANT:  Fields  ANESTHESIA:  General.  COMPLICATIONS:  No complications.   DRAINS:  None.  DESCRIPTION OF PROCEDURE:  The patient was taken to the operating suite, and after the induction of adequate general anesthetic, the radial artery was explored distally by Dr. Ruta Hinds of the vascular surgery service.  It was noted to be intact  with good Doppler flow.  I was then brought into the operating field and extended the incision proximally.  We identified the fascia overlying the brachioradialis tendon and released the superficial forearm fascia.  We then released the deep forearm  fascia bluntly.  All volar compartments were released.  There was a fair amount of blood in the superficial compartments, both on the radial and ulnar sides.  We identified the radial artery throughout the distal half of the forearm, and it was intact.   We also identified the median nerve and ulnar neurovascular bundle, all of which were intact.  The wound was thoroughly irrigated and then loosely closed in layers of 2-0 undyed Vicryl and staples on the skin.  Xeroform, 4 x 4's, fluffs, and a lightly  compressive dressing were applied.  The patient tolerated these procedures well and went to recovery in stable fashion.  LN/NUANCE  D:08/23/2017 T:08/23/2017 JOB:[id number]

## 2017-08-24 NOTE — Progress Notes (Signed)
No complaints, pain controlled.  Vitals:   08/23/17 2300 08/24/17 0300 08/24/17 0304 08/24/17 0707  BP: 125/65  131/70 124/74  Pulse: 73 64 74 72  Resp: 13  15 15   Temp: 98.4 F (36.9 C)  98.7 F (37.1 C) 99.1 F (37.3 C)  TempSrc: Oral  Oral Oral  SpO2: 95% 96% 96% 97%  Weight:      Height:        Right arm 2+ Radial hand pink warm incision clean  Ok for d/c from my standpoint.  Seems to be ok with tylenol for pain.  May want to consider a few stronger tablets on hand next 1-2 days  Will arrange follow up 2 weeks  No heavy lifting  Wash with soap and water once daily starting tomorrow  Ruta Hinds, MD [facility]: [phone] Pager: [phone]

## 2017-08-24 NOTE — Anesthesia Postprocedure Evaluation (Signed)
Anesthesia Post Note  Patient: Shane Wood  Procedure(s) Performed: EXPLORATION RIGHT RADIAL ARTERY,  RIGHT FOREARM FASCIOTOMY (Right Arm Lower)     Patient location during evaluation: PACU Anesthesia Type: General Level of consciousness: awake and alert Pain management: pain level controlled Vital Signs Assessment: post-procedure vital signs reviewed and stable Respiratory status: spontaneous breathing, nonlabored ventilation, respiratory function stable and patient connected to nasal cannula oxygen Cardiovascular status: blood pressure returned to baseline and stable Postop Assessment: no apparent nausea or vomiting Anesthetic complications: no    Last Vitals:  Vitals:   08/24/17 0304 08/24/17 0707  BP: 131/70 124/74  Pulse: 74 72  Resp: 15 15  Temp: 37.1 C 37.3 C  SpO2: 96% 97%    Last Pain:  Vitals:   08/24/17 1027  TempSrc:   PainSc: 0-No pain                 Chas Axel S

## 2017-08-24 NOTE — Progress Notes (Addendum)
CARDIAC REHAB PHASE I   PRE:  Rate/Rhythm: 69 nsr  BP:  Sitting: 115/56      SaO2: 97 ra  MODE:  Ambulation: 250 ft   POST:  Rate/Rhythm: 77 nsr  BP:  Sitting: 130/74    SaO2: 98 ra 1125-1218 Patient ambulated in hallway independently pushing IV pole. Steady gait noted. Denied complaints. Discharge education complete including NTG, activity progression, phase 2 CR, antiplatelet, MI booklet, and diet. Phase 2 order placed for [place], stent cards given.  Brant Peets English PayneRN, BSN 08/24/2017 1:10 PM

## 2017-08-24 NOTE — Discharge Instructions (Addendum)
Angiogram An angiogram, also called angiography, is a procedure used to look at the blood vessels. In this procedure, dye is injected through a long, thin tube (catheter) into an artery. X-rays are then taken. The X-rays will show if there is a blockage or problem in a blood vessel. Tell a health care provider about:  Any allergies you have, including allergies to shellfish or contrast dye.  All medicines you are taking, including vitamins, herbs, eye drops, creams, and over-the-counter medicines.  Any problems you or family members have had with anesthetic medicines.  Any blood disorders you have.  Any surgeries you have had.  Any previous kidney problems or failure you have had.  Any medical conditions you have.  Possibility of pregnancy, if this applies. What are the risks? Generally, an angiogram is a safe procedure. However, as with any procedure, problems can occur. Possible problems include:  Injury to the blood vessels, including rupture or bleeding.  Infection or bruising at the catheter site.  Allergic reaction to the dye or contrast used.  Kidney damage from the dye or contrast used.  Blood clots that can lead to a stroke or heart attack.  What happens before the procedure?  Do not eat or drink after midnight on the night before the procedure, or as directed by your health care provider.  Ask your health care provider if you may drink enough water to take any needed medicines the morning of the procedure. What happens during the procedure?  You may be given a medicine to help you relax (sedative) before and during the procedure. This medicine is given through an IV access tube that is inserted into one of your veins.  The area where the catheter will be inserted will be washed and shaved. This is usually done in the groin but may be done in the fold of your arm (near your elbow) or in the wrist.  A medicine will be given to numb the area where the catheter  will be inserted (local anesthetic).  The catheter will be inserted with a guide wire into an artery. The catheter is guided by using a type of X-ray (fluoroscopy) to the blood vessel being examined.  Dye is then injected into the catheter, and X-rays are taken. The dye helps to show where any narrowing or blockages are located. What happens after the procedure?  If the procedure is done through the leg, you will be kept in bed lying flat for several hours. You will be instructed to not bend or cross your legs.  The insertion site will be checked frequently.  The pulse in your feet or wrist will be checked frequently.  Additional blood tests, X-rays, and electrocardiography may be done.  You may need to stay in the hospital overnight for observation. This information is not intended to replace advice given to you by your health care provider. Make sure you discuss any questions you have with your health care provider. Document Released: 01/17/2005 Document Revised: 09/21/2015 Document Reviewed: 09/10/2012 Elsevier Interactive Patient Education  2017 [place] An angiogram, also called angiography, is a procedure used to look at the blood vessels. In this procedure, dye is injected through a long, thin tube (catheter) into an artery. X-rays are then taken. The X-rays will show if there is a blockage or problem in a blood vessel. Tell a health care provider about:  Any allergies you have, including allergies to shellfish or contrast dye.  All medicines you are taking, including  vitamins, herbs, eye drops, creams, and over-the-counter medicines.  Any problems you or family members have had with anesthetic medicines.  Any blood disorders you have.  Any surgeries you have had.  Any previous kidney problems or failure you have had.  Any medical conditions you have.  Possibility of pregnancy, if this applies. What are the risks? Generally, an angiogram is a safe  procedure. However, as with any procedure, problems can occur. Possible problems include:  Injury to the blood vessels, including rupture or bleeding.  Infection or bruising at the catheter site.  Allergic reaction to the dye or contrast used.  Kidney damage from the dye or contrast used.  Blood clots that can lead to a stroke or heart attack.  What happens before the procedure?  Do not eat or drink after midnight on the night before the procedure, or as directed by your health care provider.  Ask your health care provider if you may drink enough water to take any needed medicines the morning of the procedure. What happens during the procedure?  You may be given a medicine to help you relax (sedative) before and during the procedure. This medicine is given through an IV access tube that is inserted into one of your veins.  The area where the catheter will be inserted will be washed and shaved. This is usually done in the groin but may be done in the fold of your arm (near your elbow) or in the wrist.  A medicine will be given to numb the area where the catheter will be inserted (local anesthetic).  The catheter will be inserted with a guide wire into an artery. The catheter is guided by using a type of X-ray (fluoroscopy) to the blood vessel being examined.  Dye is then injected into the catheter, and X-rays are taken. The dye helps to show where any narrowing or blockages are located. What happens after the procedure?  If the procedure is done through the leg, you will be kept in bed lying flat for several hours. You will be instructed to not bend or cross your legs.  The insertion site will be checked frequently.  The pulse in your feet or wrist will be checked frequently.  Additional blood tests, X-rays, and electrocardiography may be done.  You may need to stay in the hospital overnight for observation. This information is not intended to replace advice given to you by your  health care provider. Make sure you discuss any questions you have with your health care provider. Document Released: 01/17/2005 Document Revised: 09/21/2015 Document Reviewed: 09/10/2012 Elsevier Interactive Patient Education  2017 [employer].

## 2017-08-24 NOTE — Care Management (Signed)
Discussed new Brilinta script with patient and wife.  [facility] quotes the copay at $25/month, which patient states is not a problem for him to pay.  Pharmacy has medication in stock.  Pt given copay card.  Pt and wife verbalize understanding.

## 2017-08-24 NOTE — Progress Notes (Signed)
Patient seen and examined at bedside this morning.  Dressing dry and intact.  Patient has full range of motion with no signs of compartment syndrome.  Patient has good capillary refill and good skin turgor warm hand.  No signs of neurovascular compromise.  Patient is okay for discharge from my standpoint will need to see my office later this week.

## 2017-08-25 ENCOUNTER — Encounter (HOSPITAL_COMMUNITY): Payer: Self-pay | Admitting: Vascular Surgery

## 2017-08-26 ENCOUNTER — Telehealth: Payer: Self-pay | Admitting: Cardiovascular Disease

## 2017-08-26 NOTE — Telephone Encounter (Signed)
Patient contacted regarding discharge from [facility] on 08/24/17.  Patient understands to follow up with provider Ermalinda Barrios, PA-c on 08/29/17 at 11:30 at [facility] in [place]. Patient understands discharge instructions? Yes Patient understands medications and regiment? Yes Patient understands to bring all medications to this visit? Yes  The pt requested a sooner appt as he wants to be cleared to drive.  Appt changed from 5/21 w/Vin Bhagat, PA-c to 5/9 at 11:30 with Ermalinda Barrios, PA-c. The pt is in agreement with date and time of appt.

## 2017-08-26 NOTE — Telephone Encounter (Signed)
New message   Patient's discharge summary from hospital  states to f/u in 7-14 days with Dr Johnsie Cancel No appointment available during timeframe requested. Patient will be out of town 5/16 and 5/17. Please confirm patient can be scheduled beyond 14 days or approve a slot to schedule.

## 2017-08-26 NOTE — Telephone Encounter (Signed)
Left message for patient to call back  

## 2017-08-26 NOTE — Telephone Encounter (Signed)
Follow up   Pt couldn't get scheduled until 5/21 due to being out of town, pt would like to know if he can return back to work. Please call

## 2017-08-26 NOTE — Telephone Encounter (Signed)
Patient will see PA next week for follow up from hospital.

## 2017-08-26 NOTE — Telephone Encounter (Signed)
TOC appt  Made on 09/10/17 at 9:00 with Vin Bhagat per Christell Faith through Staff message

## 2017-08-27 ENCOUNTER — Telehealth: Payer: Self-pay | Admitting: Cardiovascular Disease

## 2017-08-27 NOTE — Telephone Encounter (Signed)
Follow Up:    Returning your call,please call asap in case he needs to see somebody today.Shane Wood

## 2017-08-27 NOTE — Telephone Encounter (Signed)
Follow up  ° ° °Patient is returning call.  °

## 2017-08-27 NOTE — Telephone Encounter (Signed)
Left message for patient to call back  

## 2017-08-27 NOTE — Progress Notes (Signed)
Office: [phone]  /  Fax: [phone]   HPI:   Chief Complaint: OBESITY Shane Wood is here to discuss his progress with his obesity treatment plan. He is on the Category 3 plan and is following his eating plan approximately 100 % of the time. He states he is exercising on the stationary bike for 30 minutes 3 times per week. Shane Wood did well following the plan and has lost fat, but is retaining some fluid. He also journaled some of his food. He is getting bored with some of his meals and would like more options. His weight is 216 lb (98 kg) today and has maintained weight over a period of 2 weeks since his last visit. He has lost 0 lbs since starting treatment with [name].  Vitamin D deficiency (new) Shane Wood has a new diagnosis of vitamin D deficiency. Shane Wood is not currently taking vit D and he admits fatigue and denies nausea, vomiting or muscle weakness.  Pre-Diabetes Shane Wood has a new diagnosis of pre-diabetes based on his elevated Hgb A1c of 5.7 and was informed this puts him at greater risk of developing diabetes. He is not taking metformin currently and continues to work on diet and exercise to decrease risk of diabetes. He admits polyphagia and denies hypoglycemia.  At risk for diabetes Shane Wood is at higher than average risk for developing diabetes due to his obesity and pre-diabetes. He currently denies polyuria or polydipsia.  ALLERGIES: No Known Allergies  MEDICATIONS: Current Outpatient Medications on File Prior to Visit  Medication Sig Dispense Refill  . Ascorbic Acid (VITAMIN C) 1000 MG tablet Take 1,000 mg by mouth daily.    . Cyanocobalamin (VITAMIN B 12 PO) Take 1 tablet by mouth daily.    . Multiple Vitamin (MULTIVITAMIN) tablet Take 1 tablet by mouth daily.      Shane Wood Kitchen omeprazole (PRILOSEC) 20 MG capsule Take 20 mg by mouth daily.     No current facility-administered medications on file prior to visit.     PAST MEDICAL HISTORY: Past Medical History:  Diagnosis Date    . Allergy   . BACK PAIN   . ELEVATED BP READING WITHOUT DX HYPERTENSION 05/03/2008   Qualifier: Diagnosis of  By: Larose Kells MD, [place] GERD (gastroesophageal reflux disease)   . H/O cardiovascular stress test 2005    (-)  . Hyperlipidemia   . INSOMNIA-SLEEP DISORDER-UNSPEC   . PVC (premature ventricular contraction)     PAST SURGICAL HISTORY: Past Surgical History:  Procedure Laterality Date  . ARTERY REPAIR Right 08/23/2017   Procedure: EXPLORATION RIGHT RADIAL ARTERY,  RIGHT FOREARM FASCIOTOMY;  Surgeon: Elam Dutch, MD;  Location: [facility] OR;  Service: Vascular;  Laterality: Right;  . CHOLECYSTECTOMY    . CORONARY STENT INTERVENTION N/A 08/23/2017   Procedure: CORONARY STENT INTERVENTION;  Surgeon: Jettie Booze, MD;  Location: [place] [facility];  Service: Cardiovascular;  Laterality: N/A;  . LEFT HEART CATH AND CORONARY ANGIOGRAPHY N/A 08/23/2017   Procedure: LEFT HEART CATH AND CORONARY ANGIOGRAPHY;  Surgeon: Jettie Booze, MD;  Location: [place] [facility];  Service: Cardiovascular;  Laterality: N/A;  . POLYPECTOMY    . TONSILLECTOMY     age 62 y/o    SOCIAL HISTORY: Social History   Tobacco Use  . Smoking status: Former Smoker    Last attempt to quit: 09/24/1988    Years since quitting: 28.9  . Smokeless tobacco: Former Systems developer    Types: Chew    Quit date: 1970  Substance Use Topics  . Alcohol use: Yes    Alcohol/week: 2.4 oz    Types: 4 Glasses of wine per week    Comment: occasionally on weekends  . Drug use: No    FAMILY HISTORY: Family History  Problem Relation Age of Onset  . Lung cancer Father        smoker  . Diabetes Maternal Grandmother   . Diabetes Maternal Grandfather   . Hypertension Mother   . Depression Mother   . Obesity Mother   . Heart attack Other        uncle MI at age 70s  . Stroke Other        GM, in her 56  . Colon cancer Neg Hx   . Prostate cancer Neg Hx   . Colon polyps Neg Hx   . Esophageal cancer Neg Hx   . Rectal  cancer Neg Hx   . Stomach cancer Neg Hx   . Pancreatic cancer Neg Hx     ROS: Review of Systems  Constitutional: Positive for malaise/fatigue. Negative for weight loss.  Gastrointestinal: Negative for nausea and vomiting.  Genitourinary: Negative for frequency.  Musculoskeletal:       Negative for muscle weakness  Endo/Heme/Allergies: Negative for polydipsia.       Positive for polyphagia Negative for hypoglycemia    PHYSICAL EXAM: Blood pressure (!) 143/80, pulse 67, temperature 98.3 F (36.8 C), temperature source Oral, height 5\' 11"  (1.803 m), weight 216 lb (98 kg), SpO2 96 %. Body mass index is 30.13 kg/m. Physical Exam  Constitutional: He is oriented to person, place, and time. He appears well-developed and well-nourished.  Cardiovascular: Normal rate.  Pulmonary/Chest: Effort normal.  Musculoskeletal: Normal range of motion.  Neurological: He is oriented to person, place, and time.  Skin: Skin is warm and dry.  Psychiatric: He has a normal mood and affect. His behavior is normal.  Vitals reviewed.   RECENT LABS AND TESTS: BMET    Component Value Date/Time   NA 137 08/24/2017 0257   NA 142 08/08/2017 1016   K 4.2 08/24/2017 0257   CL 107 08/24/2017 0257   CO2 23 08/24/2017 0257   GLUCOSE 149 (H) 08/24/2017 0257   BUN 18 08/24/2017 0257   BUN 24 08/08/2017 1016   CREATININE 0.99 08/24/2017 0257   CALCIUM 8.3 (L) 08/24/2017 0257   GFRNONAA >60 08/24/2017 0257   GFRAA >60 08/24/2017 0257   Lab Results  Component Value Date   HGBA1C 5.7 (H) 08/08/2017   Lab Results  Component Value Date   INSULIN 11.8 08/08/2017   CBC    Component Value Date/Time   WBC 12.1 (H) 08/24/2017 0257   RBC 3.93 (L) 08/24/2017 0257   HGB 11.0 (L) 08/24/2017 0257   HCT 33.6 (L) 08/24/2017 0257   PLT 204 08/24/2017 0257   MCV 85.5 08/24/2017 0257   MCH 28.0 08/24/2017 0257   MCHC 32.7 08/24/2017 0257   RDW 14.8 08/24/2017 0257   LYMPHSABS 1.7 07/15/2017 0828   MONOABS 0.4  07/15/2017 0828   EOSABS 0.2 07/15/2017 0828   BASOSABS 0.0 07/15/2017 0828   Iron/TIBC/Ferritin/ %Sat No results found for: IRON, TIBC, FERRITIN, IRONPCTSAT Lipid Panel     Component Value Date/Time   CHOL 170 07/15/2017 0828   TRIG 205.0 (H) 07/15/2017 0828   HDL 39.80 07/15/2017 0828   CHOLHDL 4 07/15/2017 0828   VLDL 41.0 (H) 07/15/2017 0828   LDLCALC 124 (H) 07/10/2016 0827   LDLDIRECT 105.0  07/15/2017 0828   Hepatic Function Panel     Component Value Date/Time   PROT 7.1 08/08/2017 1016   ALBUMIN 4.6 08/08/2017 1016   AST 22 08/08/2017 1016   ALT 23 08/08/2017 1016   ALKPHOS 65 08/08/2017 1016   BILITOT 0.3 08/08/2017 1016   BILIDIR 0.1 06/07/2010 0908      Component Value Date/Time   TSH 2.080 08/08/2017 1016   TSH 2.14 07/15/2017 0828   TSH 1.62 07/08/2015 1405   Results for Coster, Shane A "PAT" (MRN [id number]) as of 08/27/2017 11:36  Ref. Range 08/08/2017 10:16  Vitamin D, 25-Hydroxy Latest Ref Range: 30.0 - 100.0 ng/mL 21.6 (L)   ASSESSMENT AND PLAN: Vitamin D deficiency - Plan: Vitamin D, Ergocalciferol, (DRISDOL) 50000 units CAPS capsule  Prediabetes  At risk for diabetes mellitus  Class 1 obesity with serious comorbidity and body mass index (BMI) of 30.0 to 30.9 in adult, unspecified obesity type  PLAN:  Vitamin D Deficiency Shane Wood was informed that low vitamin D levels contributes to fatigue and are associated with obesity, breast, and colon cancer. He agrees to continue to take prescription Vit D @50 ,000 IU every week and will follow up for routine testing of vitamin D, at least 2-3 times per year. He was informed of the risk of over-replacement of vitamin D and agrees to not increase his dose unless he discusses this with [name] first.  Pre-Diabetes Shane Wood will continue to work on weight loss, exercise, and decreasing simple carbohydrates in his diet to help decrease the risk of diabetes. He was informed that eating too many simple carbohydrates or  too many calories at one sitting increases the likelihood of GI side effects. We will defer metformin for now and will recheck labs in 3 months. Shane Wood agreed to follow up with [name] as directed to monitor his progress.  Diabetes risk counseling Shane Wood was given extended (30 minutes) diabetes prevention counseling today. He is 62 y.o. male and has risk factors for diabetes including obesity and pre-diabetes. We discussed intensive lifestyle modifications today with an emphasis on weight loss as well as increasing exercise and decreasing simple carbohydrates in his diet.  Obesity Shane Wood is currently in the action stage of change. As such, his goal is to continue with weight loss efforts He has agreed to follow the Category 3 plan with breakfast options Shane Wood has been instructed to work up to a goal of 150 minutes of combined cardio and strengthening exercise per week for weight loss and overall health benefits. We discussed the following Behavioral Modification Strategies today: increasing lean protein intake, decreasing simple carbohydrates , increasing vegetables and work on meal planning and easy cooking plans  Shane Wood has agreed to follow up with our clinic in 2 to 3 weeks. He was informed of the importance of frequent follow up visits to maximize his success with intensive lifestyle modifications for his multiple health conditions.   OBESITY BEHAVIORAL INTERVENTION VISIT  Today's visit was # 2 out of 22.  Starting weight: 216 lbs Starting date: 08/08/17 Today's weight : 216 lbs  Today's date: 08/22/2017 Total lbs lost to date: 0 (Patients must lose 7 lbs in the first 6 months to continue with counseling)   ASK: We discussed the diagnosis of obesity with Shane Wood today and Shane Wood agreed to give [name] permission to discuss obesity behavioral modification therapy today.  ASSESS: Shane Wood has the diagnosis of obesity and his BMI today is 30.14 Shane Wood is in the action stage of  change  ADVISE: Shane Wood was educated on the multiple health risks of obesity as well as the benefit of weight loss to improve his health. He was advised of the need for long term treatment and the importance of lifestyle modifications.  AGREE: Multiple dietary modification options and treatment options were discussed and  Shane Wood agreed to the above obesity treatment plan.  I, Doreene Nest, am acting as transcriptionist for Dennard Nip, MD  I have reviewed the above documentation for accuracy and completeness, and I agree with the above. -Dennard Nip, MD

## 2017-08-27 NOTE — Telephone Encounter (Signed)
New Message   Pt states that after his cath, he developed a bruise on his upper arm that's warm and firm. Please call

## 2017-08-27 NOTE — Telephone Encounter (Signed)
Called patient about his symptoms. Patient stated he has a large bruise on right upper arm that is warm and stiff. Patient had heart cath on 08/23/17 and per notes patient had compartment syndrome on right arm post procedure.  Consulted Dr. Tamala Julian about symptoms. Dr. Tamala Julian recommend that patient goes to ED if he has swelling, tingling and numbness in that arm. Patient had none of these symptoms and explained that this is the process of his arm healing and he will have bruising and it will be warm. Patient verbalized understanding and has an appointment on Thursday with Ermalinda Barrios PA. Patient will go to the ED if he has any swelling, tingling, or numbness in his right arm.

## 2017-08-28 ENCOUNTER — Telehealth: Payer: Self-pay | Admitting: Vascular Surgery

## 2017-08-28 NOTE — Telephone Encounter (Signed)
Sch appt 09/06/17 915 am staple removal pt confirmed

## 2017-08-28 NOTE — Telephone Encounter (Signed)
-----   Message from Mena Goes, RN sent at 08/26/2017 11:42 AM EDT ----- Regarding: 10-14 days from 08-24-17 for staple removal    ----- Message ----- From: Elam Dutch, MD Sent: 08/24/2017   8:08 AM To: [facility] Charge Pool  Pt needs follow up 10-14 days for staple removal  Ruta Hinds

## 2017-08-29 ENCOUNTER — Ambulatory Visit: Payer: BLUE CROSS/BLUE SHIELD | Admitting: Physician Assistant

## 2017-08-29 ENCOUNTER — Encounter: Payer: Self-pay | Admitting: Physician Assistant

## 2017-08-29 ENCOUNTER — Encounter: Payer: Self-pay | Admitting: Family

## 2017-08-29 VITALS — BP 126/70 | HR 63 | Ht 71.0 in | Wt 220.8 lb

## 2017-08-29 DIAGNOSIS — T79A11D Traumatic compartment syndrome of right upper extremity, subsequent encounter: Secondary | ICD-10-CM

## 2017-08-29 DIAGNOSIS — T79A11A Traumatic compartment syndrome of right upper extremity, initial encounter: Secondary | ICD-10-CM | POA: Insufficient documentation

## 2017-08-29 DIAGNOSIS — I214 Non-ST elevation (NSTEMI) myocardial infarction: Secondary | ICD-10-CM

## 2017-08-29 DIAGNOSIS — E785 Hyperlipidemia, unspecified: Secondary | ICD-10-CM

## 2017-08-29 DIAGNOSIS — I493 Ventricular premature depolarization: Secondary | ICD-10-CM

## 2017-08-29 NOTE — Patient Instructions (Signed)
Medication Instructions:  Your physician recommends that you continue on your current medications as directed. Please refer to the Current Medication list given to you today.  Labwork: Your physician recommends that you return for lab work in:  6 weeks Fasting Lipid Panel and Liver Function Test  Testing/Procedures: None ordered.  Follow-Up: Your physician wants you to follow-up in: 2 months with Dr Irish Lack. You will receive a reminder letter in the mail two months in advance. If you don't receive a letter, please call our office to schedule the follow-up appointment.   Any Other Special Instructions Will Be Listed Below (If Applicable).     If you need a refill on your cardiac medications before your next appointment, please call your pharmacy.

## 2017-08-29 NOTE — Progress Notes (Signed)
Cardiology Office Note    Date:  08/29/2017   ID:  Shane Wood, DOB Oct 16, 1955, MRN [id number]  PCP:  Colon Branch, MD  Cardiologist: Larae Grooms, MD (previously saw Dr. Johnsie Cancel 4 years ago)  Chief Complaint  Patient presents with  . Hospitalization Follow-up    History of Present Illness:  Shane Wood is a 62 y.o. male with history of normal ETT 01/2014 and echo showing LVEF 55 to 60% with mildly dilated a sending aorta and aortic sclerosis in 2015.  He was admitted to [facility] with NSTEMI 08/23/2017 and underwent cardiac catheterization with DES to the proximal to mid RCA and ostial PDA, residual 50% proximal LAD, 80% circumflex, total OM1 normal LVEF 50 to 55% with mild aortic stenosis.  Patient developed compartment syndrome of the right forearm post procedure and underwent exploration of the right forearm.  Radial artery was identified and intact median nerve and ulnar neurovascular bundle intact.  Wound was irrigated and loosely closed.  He is to follow-up with vascular surgery of as well as Dr. Burney Gauze.  Also has history of PVCs and HLD.  Patient comes in today accompanied by his wife.  He denies any chest tightness, pressure, dyspnea, dyspnea on exertion, dizziness or presyncope.  His right arm is healing but still swollen and painful.  He is open to drive.  He has not taken a Vicodin for 2 days.  Many questions answered.  Hoping to start cardiac rehab soon.  Working part-time.  Walking 10 minutes twice a day.  Wants to get on his exercise bike.    Past Medical History:  Diagnosis Date  . Allergy   . BACK PAIN   . ELEVATED BP READING WITHOUT DX HYPERTENSION 05/03/2008   Qualifier: Diagnosis of  By: Larose Kells MD, [place] GERD (gastroesophageal reflux disease)   . H/O cardiovascular stress test 2005    (-)  . Hyperlipidemia   . INSOMNIA-SLEEP DISORDER-UNSPEC   . PVC (premature ventricular contraction)     Past Surgical History:  Procedure Laterality Date  . ARTERY  REPAIR Right 08/23/2017   Procedure: EXPLORATION RIGHT RADIAL ARTERY,  RIGHT FOREARM FASCIOTOMY;  Surgeon: Elam Dutch, MD;  Location: [facility] OR;  Service: Vascular;  Laterality: Right;  . CHOLECYSTECTOMY    . CORONARY STENT INTERVENTION N/A 08/23/2017   Procedure: CORONARY STENT INTERVENTION;  Surgeon: Jettie Booze, MD;  Location: [facility];  Service: Cardiovascular;  Laterality: N/A;  . LEFT HEART CATH AND CORONARY ANGIOGRAPHY N/A 08/23/2017   Procedure: LEFT HEART CATH AND CORONARY ANGIOGRAPHY;  Surgeon: Jettie Booze, MD;  Location: [facility];  Service: Cardiovascular;  Laterality: N/A;  . POLYPECTOMY    . TONSILLECTOMY     age 63 y/o    Current Medications: Current Meds  Medication Sig  . Ascorbic Acid (VITAMIN C) 1000 MG tablet Take 1,000 mg by mouth daily.  [name] aspirin EC 81 MG EC tablet Take 1 tablet (81 mg total) by mouth daily.  [name] atorvastatin (LIPITOR) 80 MG tablet Take 1 tablet (80 mg total) by mouth daily at 6 PM.  . Cyanocobalamin (VITAMIN B 12 PO) Take 1 tablet by mouth daily.  [name] HYDROcodone-acetaminophen (NORCO) 5-325 MG tablet Take 1 tablet by mouth every 4 (four) hours as needed for moderate pain.  . metoprolol tartrate (LOPRESSOR) 25 MG tablet Take 0.5 tablets (12.5 mg total) by mouth 2 (two) times daily.  . Multiple Vitamin (MULTIVITAMIN) tablet Take 1  tablet by mouth daily.    . nitroGLYCERIN (NITROSTAT) 0.4 MG SL tablet Place 1 tablet (0.4 mg total) under the tongue every 5 (five) minutes x 3 doses as needed for chest pain.  [name] omeprazole (PRILOSEC) 20 MG capsule Take 20 mg by mouth daily.  . ticagrelor (BRILINTA) 90 MG TABS tablet Take 1 tablet (90 mg total) by mouth 2 (two) times daily.  . Vitamin D, Ergocalciferol, (DRISDOL) 50000 units CAPS capsule Take 1 capsule (50,000 Units total) by mouth every 7 (seven) days.     Allergies:   Patient has no known allergies.   Social History   Socioeconomic History  . Marital status: Married     Spouse name: Shane Wood  . Number of children: 2  . Years of education: Not on file  . Highest education level: Not on file  Occupational History  . Occupation: finances , used to work for [employer]  . Financial resource strain: Not on file  . Food insecurity:    Worry: Not on file    Inability: Not on file  . Transportation needs:    Medical: Not on file    Non-medical: Not on file  Tobacco Use  . Smoking status: Former Smoker    Last attempt to quit: 09/24/1988    Years since quitting: 28.9  . Smokeless tobacco: Former Systems developer    Types: Chew    Quit date: 1970  Substance and Sexual Activity  . Alcohol use: Yes    Alcohol/week: 2.4 oz    Types: 4 Glasses of wine per week    Comment: occasionally on weekends  . Drug use: No  . Sexual activity: Not on file  Lifestyle  . Physical activity:    Days per week: Not on file    Minutes per session: Not on file  . Stress: Not on file  Relationships  . Social connections:    Talks on phone: Not on file    Gets together: Not on file    Attends religious service: Not on file    Active member of club or organization: Not on file    Attends meetings of clubs or organizations: Not on file    Relationship status: Not on file  Other Topics Concern  . Not on file  Social History Narrative   Household pt and wife    2 adult married children     Family History:  The patient's family history includes Depression in his mother; Diabetes in his maternal grandfather and maternal grandmother; Heart attack in his other; Hypertension in his mother; Lung cancer in his father; Obesity in his mother; Stroke in his other.   ROS:   Please see the history of present illness.    Review of Systems  Constitution: Negative.  HENT: Negative.   Cardiovascular: Negative.   Respiratory: Negative.   Endocrine: Negative.   Hematologic/Lymphatic: Negative.   Musculoskeletal: Negative.        Right arm discomfort from surgery  Gastrointestinal:  Negative.   Genitourinary: Negative.   Neurological: Negative.    All other systems reviewed and are negative.   PHYSICAL EXAM:   VS:  BP 126/70   Pulse 63   Ht 5' 11" (1.803 m)   Wt 220 lb 12.8 oz (100.2 kg)   SpO2 97%   BMI 30.80 kg/m   Physical Exam  GEN: Well nourished, well developed, in no acute distress  Neck: no JVD, carotid bruits, or masses Cardiac:RRR; no murmurs, rubs, or  gallops  Respiratory:  clear to auscultation bilaterally, normal work of breathing GI: soft, nontender, nondistended, + BS Ext: Right arm bandaged at cath site with bruising in the right upper arm soft and resolving Neuro:  Alert and Oriented x 3 Psych: euthymic mood, full affect  Wt Readings from Last 3 Encounters:  08/29/17 220 lb 12.8 oz (100.2 kg)  08/23/17 213 lb 3 oz (96.7 kg)  08/22/17 216 lb (98 kg)      Studies/Labs Reviewed:   EKG:  EKG is  ordered today.    Recent Labs: 08/08/2017: ALT 23; TSH 2.080 08/24/2017: BUN 18; Creatinine, Ser 0.99; Hemoglobin 11.0; Platelets 204; Potassium 4.2; Sodium 137   Lipid Panel    Component Value Date/Time   CHOL 170 07/15/2017 0828   TRIG 205.0 (H) 07/15/2017 0828   HDL 39.80 07/15/2017 0828   CHOLHDL 4 07/15/2017 0828   VLDL 41.0 (H) 07/15/2017 0828   LDLCALC 124 (H) 07/10/2016 0827   LDLDIRECT 105.0 07/15/2017 0828    Additional studies/ records that were reviewed today include:   LHC 08/23/2017: Coronary Findings    Diagnostic  Dominance: Right  Left Anterior Descending  Prox LAD lesion 50% stenosed  Prox LAD lesion is 50% stenosed.  Left Circumflex  Mid Cx lesion 80% stenosed  Mid Cx lesion is 80% stenosed. The lesion is eccentric.  First Obtuse Marginal Branch  Collaterals  1st Mrg filled by collaterals from 3rd Mrg.     Ost 1st Mrg lesion 100% stenosed  Ost 1st Mrg lesion is 100% stenosed.  Second Obtuse Marginal Branch  Collaterals  2nd Mrg filled by collaterals from [place] 2nd Mrg to 2nd Mrg lesion 100%  stenosed  Ost 2nd Mrg to 2nd Mrg lesion is 100% stenosed.  Right Coronary Artery  Prox RCA lesion 40% stenosed  Prox RCA lesion is 40% stenosed.  Prox RCA to Mid RCA lesion 100% stenosed  Prox RCA to Mid RCA lesion is 100% stenosed.  Mid RCA lesion 75% stenosed  Mid RCA lesion is 75% stenosed.  Right Posterior Descending Artery  Collaterals  RPDA filled by collaterals from 3rd Sept.     Ost RPDA to RPDA lesion 90% stenosed  Ost RPDA to RPDA lesion is 90% stenosed.  Intervention    Prox RCA to Mid RCA lesion  Stent  Lesion crossed with guidewire using a WIRE ASAHI PROWATER 180CM. Pre-stent angioplasty was performed using a BALLOON SAPPHIRE 2.5X15. A drug-eluting stent was successfully placed using a STENT SYNERGY DES 4X12. Maximum pressure: 16 atm. Stent strut is well apposed. Stent overlaps previously placed stent. Post-stent angioplasty was not performed.  Post-Intervention Lesion Assessment  The intervention was successful. Pre-interventional TIMI flow is 0. Post-intervention TIMI flow is 3. No complications occurred at this lesion.  There is no residual stenosis post intervention.  Mid RCA lesion  Stent  Lesion crossed with guidewire using a WIRE ASAHI PROWATER 180CM. Pre-stent angioplasty was performed using a BALLOON SAPPHIRE 2.5X15. A drug-eluting stent was successfully placed using a STENT SYNERGY DES [id number]. Stent strut is well apposed. Post-stent angioplasty was performed using a BALLOON [place] EMERGE MR 3.25X20.  Post-Intervention Lesion Assessment  The intervention was successful. Pre-interventional TIMI flow is 3. Post-intervention TIMI flow is 3. No complications occurred at this lesion.  There is no residual stenosis post intervention.  Ost RPDA to RPDA lesion  Stent  Lesion crossed with guidewire using a WIRE ASAHI PROWATER 180CM. Pre-stent angioplasty was performed using a  BALLOON SAPPHIRE 2.5X15. A drug-eluting stent was successfully placed using a STENT SYNERGY DES  2.5X16. Stent strut is well apposed. Post-stent angioplasty was not performed.  Post-Intervention Lesion Assessment  The intervention was successful. Pre-interventional TIMI flow is 3. Post-intervention TIMI flow is 3. No complications occurred at this lesion.  There is no residual stenosis post intervention.   Left Heart    Left Ventricle The left ventricular size is normal. The left ventricular systolic function is normal. LV end diastolic pressure is normal. The left ventricular ejection fraction is 50-55% by visual estimate. There are LV function abnormalities due to segmental dysfunction.  Aortic Valve There is mild aortic valve stenosis. Mild gradient across aortic valve.      ASSESSMENT:    1. NSTEMI (non-ST elevated myocardial infarction) ([place])   2. Compartment syndrome of right upper extremity, subsequent encounter   3. PVC (premature ventricular contraction)   4. Elevated lipids      PLAN:  In order of problems listed above:  CAD status post NSTEMI treated with DES to the RCA x2 x 2 and PDA.  Residual disease of the discussed above.  No angina.  Continue Brilinta aspirin and Lipitor.  Follow-up with Dr. Irish Lack in 2 months.  To start cardiac rehab.  Compartment syndrome of the right forearm post right radial cath treated with exploration by vascular surgery and Dr. Danice Goltz appointments with them next week.  PVCs controlled on metoprolol  Hyperlipidemia we will check fasting lipid panel and LFTs in 6 weeks on Lipitor.    Medication Adjustments/Labs and Tests Ordered: Current medicines are reviewed at length with the patient today.  Concerns regarding medicines are outlined above.  Medication changes, Labs and Tests ordered today are listed in the Patient Instructions below. Patient Instructions  Medication Instructions:  Your physician recommends that you continue on your current medications as directed. Please refer to the Current Medication list given to you  today.  Labwork: Your physician recommends that you return for lab work in:  6 weeks Fasting Lipid Panel and Liver Function Test  Testing/Procedures: None ordered.  Follow-Up: Your physician wants you to follow-up in: 2 months with Dr Irish Lack. You will receive a reminder letter in the mail two months in advance. If you don't receive a letter, please call our office to schedule the follow-up appointment.   Any Other Special Instructions Will Be Listed Below (If Applicable).     If you need a refill on your cardiac medications before your next appointment, please call your pharmacy.     Sumner Boast, PA-C  08/29/2017 12:01 PM    [facility] [place], [place], [place]  [address] Phone: [phone]; Fax: [phone]

## 2017-09-02 ENCOUNTER — Telehealth (HOSPITAL_COMMUNITY): Payer: Self-pay

## 2017-09-02 NOTE — Telephone Encounter (Signed)
Called patient to see if he is interested in the Cardiac Rehab Program. Patient stated he is interested. Explain scheduling process and went over insurance with patient, verbalized understanding. Will contact patient for scheduling upon review by the RN Navigator.

## 2017-09-02 NOTE — Telephone Encounter (Signed)
Patients insurance is active and benefits verified through [facility] - No co-pay, deductible amount of $2,000/$2,000 has been met, out of pocket amount of $6,000/$5,983.71 has been met, 30% co-insurance, and no pre-authorization is required. Reference [id number]  Will contact patient to see if he is interested in the Cardiac Rehab Program. If interested, patient will need to complete follow up appt. Once completed, patient will be contacted for scheduling upon review by the RN Navigator.

## 2017-09-03 ENCOUNTER — Telehealth: Payer: Self-pay | Admitting: Internal Medicine

## 2017-09-03 DIAGNOSIS — H16403 Unspecified corneal neovascularization, bilateral: Secondary | ICD-10-CM | POA: Diagnosis not present

## 2017-09-03 DIAGNOSIS — H5712 Ocular pain, left eye: Secondary | ICD-10-CM | POA: Diagnosis not present

## 2017-09-03 DIAGNOSIS — H2513 Age-related nuclear cataract, bilateral: Secondary | ICD-10-CM | POA: Diagnosis not present

## 2017-09-03 DIAGNOSIS — H43812 Vitreous degeneration, left eye: Secondary | ICD-10-CM | POA: Diagnosis not present

## 2017-09-03 NOTE — Telephone Encounter (Signed)
Called to check on the patient, spoke with the wife, he is doing well clinically. He does not have an appointment scheduled with me but for next year.  Recommend to call anytime if there is questions, follow-up next year as recommended, sooner if needed.

## 2017-09-06 ENCOUNTER — Encounter: Payer: BLUE CROSS/BLUE SHIELD | Admitting: Family

## 2017-09-10 ENCOUNTER — Ambulatory Visit (INDEPENDENT_AMBULATORY_CARE_PROVIDER_SITE_OTHER): Payer: BLUE CROSS/BLUE SHIELD | Admitting: Family Medicine

## 2017-09-10 ENCOUNTER — Ambulatory Visit: Payer: Self-pay | Admitting: Physician Assistant

## 2017-09-10 VITALS — BP 122/69 | HR 60 | Temp 98.1°F | Ht 71.0 in | Wt 210.0 lb

## 2017-09-10 DIAGNOSIS — E669 Obesity, unspecified: Secondary | ICD-10-CM

## 2017-09-10 DIAGNOSIS — I25118 Atherosclerotic heart disease of native coronary artery with other forms of angina pectoris: Secondary | ICD-10-CM

## 2017-09-10 DIAGNOSIS — Z6830 Body mass index (BMI) 30.0-30.9, adult: Secondary | ICD-10-CM

## 2017-09-11 ENCOUNTER — Other Ambulatory Visit (INDEPENDENT_AMBULATORY_CARE_PROVIDER_SITE_OTHER): Payer: Self-pay | Admitting: Family Medicine

## 2017-09-11 DIAGNOSIS — E559 Vitamin D deficiency, unspecified: Secondary | ICD-10-CM

## 2017-09-11 NOTE — Progress Notes (Signed)
Office: [phone]  /  Fax: [phone]   HPI:   Chief Complaint: OBESITY Shane Wood is here to discuss his progress with his obesity treatment plan. He is on the Category 3 plan with breakfast options and is following his eating plan approximately 60 to 70 % of the time. He states he is walking for 30 minutes 7 times per week. Shane Wood continues to do very well with weight loss. He is trying to do a more mediterranean diet per his cardiologist. Hunger is controlled, but he has questions about some of his food choices. His weight is 210 lb (95.3 kg) today and has had a weight loss of 6 pounds over a period of 2 to 3 weeks since his last visit. He has lost 6 lbs since starting treatment with [name].  Coronary Artery Disease Shane Wood states he had a recent heart attack. His troponin I was elevated and he had 3 stents placed. He is now on dual platelet therapy and high dose statin. Shane Wood has questions about his mediterranean diet.  ALLERGIES: No Known Allergies  MEDICATIONS: Current Outpatient Medications on File Prior to Visit  Medication Sig Dispense Refill  . Ascorbic Acid (VITAMIN C) 1000 MG tablet Take 1,000 mg by mouth daily.    Shane Wood Kitchen aspirin EC 81 MG EC tablet Take 1 tablet (81 mg total) by mouth daily. 30 tablet 11  . atorvastatin (LIPITOR) 80 MG tablet Take 1 tablet (80 mg total) by mouth daily at 6 PM. 30 tablet 11  . Cyanocobalamin (VITAMIN B 12 PO) Take 1 tablet by mouth daily.    . metoprolol tartrate (LOPRESSOR) 25 MG tablet Take 0.5 tablets (12.5 mg total) by mouth 2 (two) times daily. 60 tablet 11  . Multiple Vitamin (MULTIVITAMIN) tablet Take 1 tablet by mouth daily.      . nitroGLYCERIN (NITROSTAT) 0.4 MG SL tablet Place 1 tablet (0.4 mg total) under the tongue every 5 (five) minutes x 3 doses as needed for chest pain. 25 tablet 11  . omeprazole (PRILOSEC) 20 MG capsule Take 20 mg by mouth daily.    . ticagrelor (BRILINTA) 90 MG TABS tablet Take 1 tablet (90 mg total) by mouth 2  (two) times daily. 60 tablet 0  . Vitamin D, Ergocalciferol, (DRISDOL) 50000 units CAPS capsule Take 1 capsule (50,000 Units total) by mouth every 7 (seven) days. 4 capsule 0   No current facility-administered medications on file prior to visit.     PAST MEDICAL HISTORY: Past Medical History:  Diagnosis Date  . Allergy   . BACK PAIN   . ELEVATED BP READING WITHOUT DX HYPERTENSION 05/03/2008   Qualifier: Diagnosis of  By: Larose Kells MD, [place] GERD (gastroesophageal reflux disease)   . H/O cardiovascular stress test 2005    (-)  . Hyperlipidemia   . INSOMNIA-SLEEP DISORDER-UNSPEC   . PVC (premature ventricular contraction)     PAST SURGICAL HISTORY: Past Surgical History:  Procedure Laterality Date  . ARTERY REPAIR Right 08/23/2017   Procedure: EXPLORATION RIGHT RADIAL ARTERY,  RIGHT FOREARM FASCIOTOMY;  Surgeon: Elam Dutch, MD;  Location: [facility] OR;  Service: Vascular;  Laterality: Right;  . CHOLECYSTECTOMY    . CORONARY STENT INTERVENTION N/A 08/23/2017   Procedure: CORONARY STENT INTERVENTION;  Surgeon: Jettie Booze, MD;  Location: [facility];  Service: Cardiovascular;  Laterality: N/A;  . LEFT HEART CATH AND CORONARY ANGIOGRAPHY N/A 08/23/2017   Procedure: LEFT HEART CATH AND CORONARY ANGIOGRAPHY;  Surgeon: Larae Grooms  S, MD;  Location: [facility];  Service: Cardiovascular;  Laterality: N/A;  . POLYPECTOMY    . TONSILLECTOMY     age 62 y/o    SOCIAL HISTORY: Social History   Tobacco Use  . Smoking status: Former Smoker    Last attempt to quit: 09/24/1988    Years since quitting: 28.9  . Smokeless tobacco: Former Systems developer    Types: Chew    Quit date: 1970  Substance Use Topics  . Alcohol use: Yes    Alcohol/week: 2.4 oz    Types: 4 Glasses of wine per week    Comment: occasionally on weekends  . Drug use: No    FAMILY HISTORY: Family History  Problem Relation Age of Onset  . Lung cancer Father        smoker  . Diabetes Maternal  Grandmother   . Diabetes Maternal Grandfather   . Hypertension Mother   . Depression Mother   . Obesity Mother   . Heart attack Other        uncle MI at age 64s  . Stroke Other        GM, in her 28  . Colon cancer Neg Hx   . Prostate cancer Neg Hx   . Colon polyps Neg Hx   . Esophageal cancer Neg Hx   . Rectal cancer Neg Hx   . Stomach cancer Neg Hx   . Pancreatic cancer Neg Hx     ROS: Review of Systems  Constitutional: Positive for weight loss.    PHYSICAL EXAM: Blood pressure 122/69, pulse 60, temperature 98.1 F (36.7 C), temperature source Oral, height 5\' 11"  (1.803 m), weight 210 lb (95.3 kg), SpO2 98 %. Body mass index is 29.29 kg/m. Physical Exam  Constitutional: He is oriented to person, place, and time. He appears well-developed and well-nourished.  Cardiovascular: Normal rate.  Pulmonary/Chest: Effort normal.  Musculoskeletal: Normal range of motion.  Neurological: He is oriented to person, place, and time.  Skin: Skin is warm and dry.  Psychiatric: He has a normal mood and affect. His behavior is normal.  Vitals reviewed.   RECENT LABS AND TESTS: BMET    Component Value Date/Time   NA 137 08/24/2017 0257   NA 142 08/08/2017 1016   K 4.2 08/24/2017 0257   CL 107 08/24/2017 0257   CO2 23 08/24/2017 0257   GLUCOSE 149 (H) 08/24/2017 0257   BUN 18 08/24/2017 0257   BUN 24 08/08/2017 1016   CREATININE 0.99 08/24/2017 0257   CALCIUM 8.3 (L) 08/24/2017 0257   GFRNONAA >60 08/24/2017 0257   GFRAA >60 08/24/2017 0257   Lab Results  Component Value Date   HGBA1C 5.7 (H) 08/08/2017   Lab Results  Component Value Date   INSULIN 11.8 08/08/2017   CBC    Component Value Date/Time   WBC 12.1 (H) 08/24/2017 0257   RBC 3.93 (L) 08/24/2017 0257   HGB 11.0 (L) 08/24/2017 0257   HCT 33.6 (L) 08/24/2017 0257   PLT 204 08/24/2017 0257   MCV 85.5 08/24/2017 0257   MCH 28.0 08/24/2017 0257   MCHC 32.7 08/24/2017 0257   RDW 14.8 08/24/2017 0257    LYMPHSABS 1.7 07/15/2017 0828   MONOABS 0.4 07/15/2017 0828   EOSABS 0.2 07/15/2017 0828   BASOSABS 0.0 07/15/2017 0828   Iron/TIBC/Ferritin/ %Sat No results found for: IRON, TIBC, FERRITIN, IRONPCTSAT Lipid Panel     Component Value Date/Time   CHOL 170 07/15/2017 0828   TRIG  205.0 (H) 07/15/2017 0828   HDL 39.80 07/15/2017 0828   CHOLHDL 4 07/15/2017 0828   VLDL 41.0 (H) 07/15/2017 0828   LDLCALC 124 (H) 07/10/2016 0827   LDLDIRECT 105.0 07/15/2017 0828   Hepatic Function Panel     Component Value Date/Time   PROT 7.1 08/08/2017 1016   ALBUMIN 4.6 08/08/2017 1016   AST 22 08/08/2017 1016   ALT 23 08/08/2017 1016   ALKPHOS 65 08/08/2017 1016   BILITOT 0.3 08/08/2017 1016   BILIDIR 0.1 06/07/2010 0908      Component Value Date/Time   TSH 2.080 08/08/2017 1016   TSH 2.14 07/15/2017 0828   TSH 1.62 07/08/2015 1405   Results for Shane Wood, Shane A "PAT" (MRN [id number]) as of 09/11/2017 14:02  Ref. Range 08/08/2017 10:16  Vitamin D, 25-Hydroxy Latest Ref Range: 30.0 - 100.0 ng/mL 21.6 (L)   ASSESSMENT AND PLAN: Coronary artery disease of native artery of native heart with stable angina pectoris (HCC)  Class 1 obesity with serious comorbidity and body mass index (BMI) of 30.0 to 30.9 in adult, unspecified obesity type - Starting BMI greater then 30  PLAN:  Coronary Artery Disease Patient was educated on coronary artery disease, diet and weight and he was educated on the [place] diet and how to modify his category 3 diet to fit this.  We spent > than 50% of the 30 minute visit on the counseling as documented in the note.  Obesity Shane Wood is currently in the action stage of change. As such, his goal is to continue with weight loss efforts He has agreed to follow the Category 3 plan Shane Wood has been instructed to work up to a goal of 150 minutes of combined cardio and strengthening exercise per week for weight loss and overall health benefits. We discussed the  following Behavioral Modification Strategies today: decrease red meat to 1 time per week or less, increasing lean protein intake (like fish), increasing vegetables and decreasing sodium intake  Shane Wood has agreed to follow up with our clinic in 4 weeks. He was informed of the importance of frequent follow up visits to maximize his success with intensive lifestyle modifications for his multiple health conditions.   OBESITY BEHAVIORAL INTERVENTION VISIT  Today's visit was # 3 out of 22.  Starting weight: 216 lbs Starting date: 08/08/17 Today's weight : 210 lbs  Today's date: 09/10/2017 Total lbs lost to date: 6 (Patients must lose 7 lbs in the first 6 months to continue with counseling)   ASK: We discussed the diagnosis of obesity with Shane Wood today and Shane Wood agreed to give [name] permission to discuss obesity behavioral modification therapy today.  ASSESS: Shane Wood has the diagnosis of obesity and his BMI today is 29.3 Shane Wood is in the action stage of change   ADVISE: Shane Wood was educated on the multiple health risks of obesity as well as the benefit of weight loss to improve his health. He was advised of the need for long term treatment and the importance of lifestyle modifications.  AGREE: Multiple dietary modification options and treatment options were discussed and  Shane Wood agreed to the above obesity treatment plan.  I, Doreene Nest, am acting as transcriptionist for Dennard Nip, MD  I have reviewed the above documentation for accuracy and completeness, and I agree with the above. -Dennard Nip, MD

## 2017-09-26 ENCOUNTER — Telehealth (HOSPITAL_COMMUNITY): Payer: Self-pay

## 2017-09-26 NOTE — Telephone Encounter (Signed)
Called patient to schedule Cardiac Rehab - Scheduled orientation on 11/14/2017 at 7:30am. Patient will attend the 6:45am exc class. Mailed packet.

## 2017-10-04 ENCOUNTER — Other Ambulatory Visit: Payer: BLUE CROSS/BLUE SHIELD | Admitting: *Deleted

## 2017-10-04 DIAGNOSIS — E785 Hyperlipidemia, unspecified: Secondary | ICD-10-CM

## 2017-10-04 DIAGNOSIS — T79A11D Traumatic compartment syndrome of right upper extremity, subsequent encounter: Secondary | ICD-10-CM

## 2017-10-04 DIAGNOSIS — I493 Ventricular premature depolarization: Secondary | ICD-10-CM

## 2017-10-04 DIAGNOSIS — I214 Non-ST elevation (NSTEMI) myocardial infarction: Secondary | ICD-10-CM | POA: Diagnosis not present

## 2017-10-04 LAB — LIPID PANEL
CHOL/HDL RATIO: 3.8 ratio (ref 0.0–5.0)
CHOLESTEROL TOTAL: 126 mg/dL (ref 100–199)
HDL: 33 mg/dL — AB (ref >39)
LDL Calculated: 64 mg/dL (ref 0–99)
TRIGLYCERIDES: 143 mg/dL (ref 0–149)
VLDL Cholesterol Cal: 29 mg/dL (ref 5–40)

## 2017-10-04 LAB — HEPATIC FUNCTION PANEL
ALBUMIN: 4.3 g/dL (ref 3.6–4.8)
ALT: 27 IU/L (ref 0–44)
AST: 16 IU/L (ref 0–40)
Alkaline Phosphatase: 56 IU/L (ref 39–117)
Bilirubin Total: 0.4 mg/dL (ref 0.0–1.2)
Bilirubin, Direct: 0.11 mg/dL (ref 0.00–0.40)
Total Protein: 6.5 g/dL (ref 6.0–8.5)

## 2017-10-08 ENCOUNTER — Ambulatory Visit (INDEPENDENT_AMBULATORY_CARE_PROVIDER_SITE_OTHER): Payer: BLUE CROSS/BLUE SHIELD | Admitting: Family Medicine

## 2017-10-08 VITALS — BP 113/65 | HR 57 | Temp 98.2°F | Ht 71.0 in | Wt 210.0 lb

## 2017-10-08 DIAGNOSIS — Z6830 Body mass index (BMI) 30.0-30.9, adult: Secondary | ICD-10-CM

## 2017-10-08 DIAGNOSIS — E669 Obesity, unspecified: Secondary | ICD-10-CM | POA: Diagnosis not present

## 2017-10-08 DIAGNOSIS — Z9189 Other specified personal risk factors, not elsewhere classified: Secondary | ICD-10-CM

## 2017-10-08 DIAGNOSIS — E559 Vitamin D deficiency, unspecified: Secondary | ICD-10-CM | POA: Diagnosis not present

## 2017-10-08 DIAGNOSIS — I2510 Atherosclerotic heart disease of native coronary artery without angina pectoris: Secondary | ICD-10-CM

## 2017-10-08 MED ORDER — VITAMIN D (ERGOCALCIFEROL) 1.25 MG (50000 UNIT) PO CAPS: 50000.0000 [IU] | ORAL_CAPSULE | ORAL | 0 refills | Status: DC

## 2017-10-08 NOTE — Progress Notes (Signed)
Pt has been made aware of normal result and verbalized understanding.  jw 10/08/17

## 2017-10-08 NOTE — Progress Notes (Signed)
Office: [phone]  /  Fax: [phone]   HPI:   Chief Complaint: OBESITY Shane Wood is here to discuss his progress with his obesity treatment plan. He is on the Category 3 plan and is following his eating plan approximately 50-75 % of the time. He states he is walking and riding bike for 20-30 minutes 2 times per week. Shane Wood has struggled to follow his plan closely and increased celebration eating but is ready to get back on track. He would like to have more freedom with his eating plan.  His weight is 210 lb (95.3 kg) today and has not lost weight since his last visit. He has lost 6 lbs since starting treatment with [name].  Coronary Artery Disease Shane Wood is status post myocardial infarction, he denies chest pain. He has questions about the mediterranean diet and how to work weight loss into this plan.  Vitamin D Deficiency Shane Wood has a diagnosis of vitamin D deficiency. He is stable on prescription Vit D, not yet at goal. He denies nausea, vomiting or muscle weakness.  At risk for diabetes Shane Wood is at higher than average risk for developing diabetes due to his obesity. He currently denies polyuria or polydipsia.  ALLERGIES: No Known Allergies  MEDICATIONS: Current Outpatient Medications on File Prior to Visit  Medication Sig Dispense Refill  . Ascorbic Acid (VITAMIN C) 1000 MG tablet Take 1,000 mg by mouth daily.    [name] aspirin EC 81 MG EC tablet Take 1 tablet (81 mg total) by mouth daily. 30 tablet 11  . atorvastatin (LIPITOR) 80 MG tablet Take 1 tablet (80 mg total) by mouth daily at 6 PM. 30 tablet 11  . Cyanocobalamin (VITAMIN B 12 PO) Take 1 tablet by mouth daily.    . metoprolol tartrate (LOPRESSOR) 25 MG tablet Take 0.5 tablets (12.5 mg total) by mouth 2 (two) times daily. 60 tablet 11  . Multiple Vitamin (MULTIVITAMIN) tablet Take 1 tablet by mouth daily.      . nitroGLYCERIN (NITROSTAT) 0.4 MG SL tablet Place 1 tablet (0.4 mg total) under the tongue every 5 (five)  minutes x 3 doses as needed for chest pain. 25 tablet 11  . omeprazole (PRILOSEC) 20 MG capsule Take 20 mg by mouth daily.    . ticagrelor (BRILINTA) 90 MG TABS tablet Take 1 tablet (90 mg total) by mouth 2 (two) times daily. 60 tablet 0   No current facility-administered medications on file prior to visit.     PAST MEDICAL HISTORY: Past Medical History:  Diagnosis Date  . Allergy   . BACK PAIN   . ELEVATED BP READING WITHOUT DX HYPERTENSION 05/03/2008   Qualifier: Diagnosis of  By: Larose Kells MD, [place] GERD (gastroesophageal reflux disease)   . H/O cardiovascular stress test 2005    (-)  . Hyperlipidemia   . INSOMNIA-SLEEP DISORDER-UNSPEC   . PVC (premature ventricular contraction)     PAST SURGICAL HISTORY: Past Surgical History:  Procedure Laterality Date  . ARTERY REPAIR Right 08/23/2017   Procedure: EXPLORATION RIGHT RADIAL ARTERY,  RIGHT FOREARM FASCIOTOMY;  Surgeon: Elam Dutch, MD;  [facility];  Service: Vascular;  Laterality: Right;  . CHOLECYSTECTOMY    . CORONARY STENT INTERVENTION N/A 08/23/2017   Procedure: CORONARY STENT INTERVENTION;  Surgeon: Jettie Booze, MD;  Location: [place] [facility];  Service: Cardiovascular;  Laterality: N/A;  . LEFT HEART CATH AND CORONARY ANGIOGRAPHY N/A 08/23/2017   Procedure: LEFT HEART CATH AND CORONARY ANGIOGRAPHY;  Surgeon: Jettie Booze, MD;  Location: [place] [facility];  Service: Cardiovascular;  Laterality: N/A;  . POLYPECTOMY    . TONSILLECTOMY     age 62 y/o    SOCIAL HISTORY: Social History   Tobacco Use  . Smoking status: Former Smoker    Last attempt to quit: 09/24/1988    Years since quitting: 29.0  . Smokeless tobacco: Former Systems developer    Types: Chew    Quit date: 1970  Substance Use Topics  . Alcohol use: Yes    Alcohol/week: 2.4 oz    Types: 4 Glasses of wine per week    Comment: occasionally on weekends  . Drug use: No    FAMILY HISTORY: Family History  Problem Relation Age of Onset  .  Lung cancer Father        smoker  . Diabetes Maternal Grandmother   . Diabetes Maternal Grandfather   . Hypertension Mother   . Depression Mother   . Obesity Mother   . Heart attack Other        uncle MI at age 52s  . Stroke Other        GM, in her 79  . Colon cancer Neg Hx   . Prostate cancer Neg Hx   . Colon polyps Neg Hx   . Esophageal cancer Neg Hx   . Rectal cancer Neg Hx   . Stomach cancer Neg Hx   . Pancreatic cancer Neg Hx     ROS: Review of Systems  Constitutional: Negative for weight loss.  Respiratory:       Negative muscle weakness  Cardiovascular: Negative for chest pain.  Gastrointestinal: Negative for nausea and vomiting.  Genitourinary: Negative for frequency.  Musculoskeletal:       Negative muscle weakness  Endo/Heme/Allergies: Negative for polydipsia.    PHYSICAL EXAM: Blood pressure 113/65, pulse (!) 57, temperature 98.2 F (36.8 C), temperature source Oral, height 5\' 11"  (1.803 m), weight 210 lb (95.3 kg), SpO2 97 %. Body mass index is 29.29 kg/m. Physical Exam  Constitutional: He is oriented to person, place, and time. He appears well-developed and well-nourished.  Cardiovascular: Normal rate.  Pulmonary/Chest: Effort normal.  Musculoskeletal: Normal range of motion.  Neurological: He is oriented to person, place, and time.  Skin: Skin is warm and dry.  Psychiatric: He has a normal mood and affect. His behavior is normal.  Vitals reviewed.   RECENT LABS AND TESTS: BMET    Component Value Date/Time   NA 137 08/24/2017 0257   NA 142 08/08/2017 1016   K 4.2 08/24/2017 0257   CL 107 08/24/2017 0257   CO2 23 08/24/2017 0257   GLUCOSE 149 (H) 08/24/2017 0257   BUN 18 08/24/2017 0257   BUN 24 08/08/2017 1016   CREATININE 0.99 08/24/2017 0257   CALCIUM 8.3 (L) 08/24/2017 0257   GFRNONAA >60 08/24/2017 0257   GFRAA >60 08/24/2017 0257   Lab Results  Component Value Date   HGBA1C 5.7 (H) 08/08/2017   Lab Results  Component Value Date     INSULIN 11.8 08/08/2017   CBC    Component Value Date/Time   WBC 12.1 (H) 08/24/2017 0257   RBC 3.93 (L) 08/24/2017 0257   HGB 11.0 (L) 08/24/2017 0257   HCT 33.6 (L) 08/24/2017 0257   PLT 204 08/24/2017 0257   MCV 85.5 08/24/2017 0257   MCH 28.0 08/24/2017 0257   MCHC 32.7 08/24/2017 0257   RDW 14.8 08/24/2017 0257   LYMPHSABS 1.7  07/15/2017 0828   MONOABS 0.4 07/15/2017 0828   EOSABS 0.2 07/15/2017 0828   BASOSABS 0.0 07/15/2017 0828   Iron/TIBC/Ferritin/ %Sat No results found for: IRON, TIBC, FERRITIN, IRONPCTSAT Lipid Panel     Component Value Date/Time   CHOL 126 10/04/2017 0802   TRIG 143 10/04/2017 0802   HDL 33 (L) 10/04/2017 0802   CHOLHDL 3.8 10/04/2017 0802   CHOLHDL 4 07/15/2017 0828   VLDL 41.0 (H) 07/15/2017 0828   LDLCALC 64 10/04/2017 0802   LDLDIRECT 105.0 07/15/2017 0828   Hepatic Function Panel     Component Value Date/Time   PROT 6.5 10/04/2017 0802   ALBUMIN 4.3 10/04/2017 0802   AST 16 10/04/2017 0802   ALT 27 10/04/2017 0802   ALKPHOS 56 10/04/2017 0802   BILITOT 0.4 10/04/2017 0802   BILIDIR 0.11 10/04/2017 0802      Component Value Date/Time   TSH 2.080 08/08/2017 1016   TSH 2.14 07/15/2017 0828   TSH 1.62 07/08/2015 1405    ASSESSMENT AND PLAN: Coronary artery disease involving native coronary artery of native heart without angina pectoris  Vitamin D deficiency - Plan: Vitamin D, Ergocalciferol, (DRISDOL) 50000 units CAPS capsule  At risk for diabetes mellitus  Class 1 obesity with serious comorbidity and body mass index (BMI) of 30.0 to 30.9 in adult, unspecified obesity type  PLAN:  Coronary Artery Disease We discussed mediterranean diet and the importance of vegetables and lean protein and to watch how much high calorie foods he eats such as olive oil and avocados. Cristofer agrees to follow up with our clinic in 3 weeks.  Vitamin D Deficiency Kayston was informed that low vitamin D levels contributes to fatigue and are  associated with obesity, breast, and colon cancer. Jrake agrees to continue taking prescription Vit D @50 ,000 IU every week #4 and we will refill for 1 month. He will follow up for routine testing of vitamin D, at least 2-3 times per year. He was informed of the risk of over-replacement of vitamin D and agrees to not increase his dose unless he discusses this with [name] first. Colman agrees to follow up with our clinic in 3 weeks.  Diabetes risk counselling Aubry was given extended (15 minutes) diabetes prevention counseling today. He is 62 y.o. male and has risk factors for diabetes including obesity. We discussed intensive lifestyle modifications today with an emphasis on weight loss as well as increasing exercise and decreasing simple carbohydrates in his diet.  Obesity Makoto is currently in the action stage of change. As such, his goal is to continue with weight loss efforts He has agreed to keep a food journal with 1500-1600 calories and 100 grams of protein daily, mediterranean based diet Tagg has been instructed to work up to a goal of 150 minutes of combined cardio and strengthening exercise per week for weight loss and overall health benefits. We discussed the following Behavioral Modification Strategies today: increasing lean protein intake and decreasing simple carbohydrates    Joanthony has agreed to follow up with our clinic in 3 weeks. He was informed of the importance of frequent follow up visits to maximize his success with intensive lifestyle modifications for his multiple health conditions.   OBESITY BEHAVIORAL INTERVENTION VISIT  Today's visit was # 4 out of 22.  Starting weight: 216 lbs Starting date: 08/08/17 Today's weight : 210 lbs Today's date: 10/08/2017 Total lbs lost to date: 6 (Patients must lose 7 lbs in the first 6 months to continue with counseling)  ASK: We discussed the diagnosis of obesity with [name] today and Malikai agreed to give [name]  permission to discuss obesity behavioral modification therapy today.  ASSESS: Tailor has the diagnosis of obesity and his BMI today is 29.3 Nayden is in the action stage of change   ADVISE: Stratton was educated on the multiple health risks of obesity as well as the benefit of weight loss to improve his health. He was advised of the need for long term treatment and the importance of lifestyle modifications.  AGREE: Multiple dietary modification options and treatment options were discussed and  Jehan agreed to the above obesity treatment plan.  I, Trixie Dredge, am acting as transcriptionist for Dennard Nip, MD  I have reviewed the above documentation for accuracy and completeness, and I agree with the above. -Dennard Nip, MD

## 2017-10-13 NOTE — Progress Notes (Signed)
Cardiology Office Note   Date:  10/14/2017   ID:  Shane Wood, DOB Nov 04, 1955, MRN [id number]  PCP:  Colon Branch, MD    No chief complaint on file.  CAD  Wt Readings from Last 3 Encounters:  10/14/17 213 lb 9.6 oz (96.9 kg)  10/08/17 210 lb (95.3 kg)  09/10/17 210 lb (95.3 kg)       History of Present Illness: Shane Wood is a 62 y.o. 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  He was admitted to [facility] with NSTEMI 08/23/2017 and underwent cardiac catheterization with DES to the proximal to mid RCA and ostial PDA, residual 50% proximal LAD, 80% circumflex, total OM1 normal LVEF 50 to 55% with mild aortic stenosis.  Patient developed compartment syndrome of the right forearm post procedure and underwent exploration of the right forearm.  Radial artery was identified and intact median nerve and ulnar neurovascular bundle intact.  Wound was irrigated and loosely closed.  Follow-up with vascular surgery of as well as Dr. Burney Gauze showed that arm had healed well.  Also has history of PVCs and HLD.  He thinks he had a lot of stress in the months before the MI.   Immediately after discharge, he had some chest twinges.  THese have resolved.  Denies : Chest pain. Dizziness. Leg edema. Nitroglycerin use. Orthopnea. Palpitations. Paroxysmal nocturnal dyspnea. Shortness of breath. Syncope.   He has not started cardiac rehab.  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  He belongs to a gym as well.      Past Medical History:  Diagnosis Date  . Allergy   . BACK PAIN   . ELEVATED BP READING WITHOUT DX HYPERTENSION 05/03/2008   Qualifier: Diagnosis of  By: Larose Kells MD, [place] GERD (gastroesophageal reflux disease)   . H/O cardiovascular stress test 2005    (-)  . Hyperlipidemia   . INSOMNIA-SLEEP DISORDER-UNSPEC   . PVC (premature ventricular contraction)     Past Surgical History:  Procedure  Laterality Date  . ARTERY REPAIR Right 08/23/2017   Procedure: EXPLORATION RIGHT RADIAL ARTERY,  RIGHT FOREARM FASCIOTOMY;  Surgeon: Elam Dutch, MD;  Location: [facility] OR;  Service: Vascular;  Laterality: Right;  . CHOLECYSTECTOMY    . CORONARY STENT INTERVENTION N/A 08/23/2017   Procedure: CORONARY STENT INTERVENTION;  Surgeon: Jettie Booze, MD;  Location: [place] [facility];  Service: Cardiovascular;  Laterality: N/A;  . LEFT HEART CATH AND CORONARY ANGIOGRAPHY N/A 08/23/2017   Procedure: LEFT HEART CATH AND CORONARY ANGIOGRAPHY;  Surgeon: Jettie Booze, MD;  Location: [place] [facility];  Service: Cardiovascular;  Laterality: N/A;  . POLYPECTOMY    . TONSILLECTOMY     age 46 y/o     Current Outpatient Medications  Medication Sig Dispense Refill  . Ascorbic Acid (VITAMIN C) 1000 MG tablet Take 1,000 mg by mouth daily.    [name] aspirin EC 81 MG EC tablet Take 1 tablet (81 mg total) by mouth daily. 30 tablet 11  . atorvastatin (LIPITOR) 80 MG tablet Take 1 tablet (80 mg total) by mouth daily at 6 PM. 30 tablet 11  . Cyanocobalamin (VITAMIN B 12 PO) Take 1 tablet by mouth daily.    [name] ketorolac (ACULAR) 0.5 % ophthalmic solution Place 0.5 drops into both eyes as needed.    . metoprolol tartrate (  LOPRESSOR) 25 MG tablet Take 0.5 tablets (12.5 mg total) by mouth 2 (two) times daily. 60 tablet 11  . Multiple Vitamin (MULTIVITAMIN) tablet Take 1 tablet by mouth daily.      . nitroGLYCERIN (NITROSTAT) 0.4 MG SL tablet Place 1 tablet (0.4 mg total) under the tongue every 5 (five) minutes x 3 doses as needed for chest pain. 25 tablet 11  . omeprazole (PRILOSEC) 20 MG capsule Take 20 mg by mouth daily.    . ticagrelor (BRILINTA) 90 MG TABS tablet Take 1 tablet (90 mg total) by mouth 2 (two) times daily. 60 tablet 0  . Vitamin D, Ergocalciferol, (DRISDOL) 50000 units CAPS capsule Take 1 capsule (50,000 Units total) by mouth every 7 (seven) days. 4 capsule 0   No current facility-administered  medications for this visit.     Allergies:   Patient has no known allergies.    Social History:  The patient  reports that he quit smoking about 29 years ago. He quit smokeless tobacco use about 49 years ago. His smokeless tobacco use included chew. He reports that he drinks about 2.4 oz of alcohol per week. He reports that he does not use drugs.   Family History:  The patient's family history includes Depression in his mother; Diabetes in his maternal grandfather and maternal grandmother; Heart attack in his other; Hypertension in his mother; Lung cancer in his father; Obesity in his mother; Stroke in his other.    ROS:  Please see the history of present illness.   Otherwise, review of systems are positive for nuisance bleeding.   All other systems are reviewed and negative.    PHYSICAL EXAM: VS:  BP 132/78   Pulse 69   Ht 5\' 11"  (1.803 m)   Wt 213 lb 9.6 oz (96.9 kg)   SpO2 97%   BMI 29.79 kg/m  , BMI Body mass index is 29.79 kg/m. GEN: Well nourished, well developed, in no acute distress  HEENT: normal  Neck: no JVD, carotid bruits, or masses Cardiac: RRR; no murmurs, rubs, or gallops,no edema  Respiratory:  clear to auscultation bilaterally, normal work of breathing GI: soft, nontender, nondistended, + BS MS: no deformity or atrophy  Skin: warm and dry, no rash Neuro:  Strength and sensation are intact Psych: euthymic mood, full affect    Recent Labs: 08/08/2017: TSH 2.080 08/24/2017: BUN 18; Creatinine, Ser 0.99; Hemoglobin 11.0; Platelets 204; Potassium 4.2; Sodium 137 10/04/2017: ALT 27   Lipid Panel    Component Value Date/Time   CHOL 126 10/04/2017 0802   TRIG 143 10/04/2017 0802   HDL 33 (L) 10/04/2017 0802   CHOLHDL 3.8 10/04/2017 0802   CHOLHDL 4 07/15/2017 0828   VLDL 41.0 (H) 07/15/2017 0828   LDLCALC 64 10/04/2017 0802   LDLDIRECT 105.0 07/15/2017 0828     Other studies Reviewed: Additional studies/ records that were reviewed today with results  demonstrating: LVEF 60 to 65% in May 2019.   ASSESSMENT AND PLAN:  1. CAD/Old MI: Related to significant family history.  No angina on medical therapy.  COntinue aggressive secondary prevention.  He had 2 chronically occluded obtuse marginal vessels as well.  Continue medical therapy.  Continue aerobic exercise.  Okay to lift weights, but avoid straining. No CHF sx. 2. Hyperlipidemia: LDL 64. Continue lipid lowering therapy.  3. PVC: No sx recently.   Current medicines are reviewed at length with the patient today.  The patient concerns regarding his medicines were addressed.  The following  changes have been made:  No change  Labs/ tests ordered today include:  No orders of the defined types were placed in this encounter.   Recommend 150 minutes/week of aerobic exercise Low fat, low carb, high fiber diet recommended  Disposition:   FU in 1 year   Signed, Larae Grooms, MD  10/14/2017 8:54 AM    [facility] [address], [place], [place]  [address] Phone: [phone]; Fax: [phone]

## 2017-10-14 ENCOUNTER — Ambulatory Visit: Payer: BLUE CROSS/BLUE SHIELD | Admitting: Interventional Cardiology

## 2017-10-14 ENCOUNTER — Encounter: Payer: Self-pay | Admitting: Interventional Cardiology

## 2017-10-14 VITALS — BP 132/78 | HR 69 | Ht 71.0 in | Wt 213.6 lb

## 2017-10-14 DIAGNOSIS — I25118 Atherosclerotic heart disease of native coronary artery with other forms of angina pectoris: Secondary | ICD-10-CM | POA: Diagnosis not present

## 2017-10-14 DIAGNOSIS — I493 Ventricular premature depolarization: Secondary | ICD-10-CM

## 2017-10-14 DIAGNOSIS — I252 Old myocardial infarction: Secondary | ICD-10-CM

## 2017-10-14 DIAGNOSIS — E782 Mixed hyperlipidemia: Secondary | ICD-10-CM

## 2017-10-14 NOTE — Patient Instructions (Signed)

## 2017-10-21 ENCOUNTER — Telehealth (HOSPITAL_COMMUNITY): Payer: Self-pay | Admitting: Pharmacist

## 2017-10-27 NOTE — Telephone Encounter (Signed)
Cardiac Rehab - Pharmacy Resident Documentation   Patient unable to be reached after three call attempts. Please complete allergy verification and medication review during patient's cardiac rehab appointment.     

## 2017-10-28 ENCOUNTER — Other Ambulatory Visit (INDEPENDENT_AMBULATORY_CARE_PROVIDER_SITE_OTHER): Payer: Self-pay | Admitting: Family Medicine

## 2017-10-28 DIAGNOSIS — E559 Vitamin D deficiency, unspecified: Secondary | ICD-10-CM

## 2017-10-31 ENCOUNTER — Ambulatory Visit (INDEPENDENT_AMBULATORY_CARE_PROVIDER_SITE_OTHER): Payer: BLUE CROSS/BLUE SHIELD | Admitting: Family Medicine

## 2017-10-31 ENCOUNTER — Encounter (HOSPITAL_COMMUNITY)
Admission: RE | Admit: 2017-10-31 | Discharge: 2017-10-31 | Disposition: A | Discharge status: Home / Self Care | Payer: BLUE CROSS/BLUE SHIELD | Source: Ambulatory Visit | Attending: Interventional Cardiology | Admitting: Interventional Cardiology

## 2017-10-31 ENCOUNTER — Encounter (HOSPITAL_COMMUNITY): Payer: Self-pay

## 2017-10-31 VITALS — Ht 70.25 in | Wt 207.5 lb

## 2017-10-31 VITALS — BP 104/66 | HR 57 | Temp 98.4°F | Ht 71.0 in | Wt 204.0 lb

## 2017-10-31 DIAGNOSIS — Z87891 Personal history of nicotine dependence: Secondary | ICD-10-CM | POA: Diagnosis not present

## 2017-10-31 DIAGNOSIS — K219 Gastro-esophageal reflux disease without esophagitis: Secondary | ICD-10-CM | POA: Insufficient documentation

## 2017-10-31 DIAGNOSIS — E559 Vitamin D deficiency, unspecified: Secondary | ICD-10-CM

## 2017-10-31 DIAGNOSIS — Z955 Presence of coronary angioplasty implant and graft: Secondary | ICD-10-CM

## 2017-10-31 DIAGNOSIS — Z9189 Other specified personal risk factors, not elsewhere classified: Secondary | ICD-10-CM | POA: Diagnosis not present

## 2017-10-31 DIAGNOSIS — E669 Obesity, unspecified: Secondary | ICD-10-CM | POA: Diagnosis not present

## 2017-10-31 DIAGNOSIS — E785 Hyperlipidemia, unspecified: Secondary | ICD-10-CM | POA: Insufficient documentation

## 2017-10-31 DIAGNOSIS — Z6830 Body mass index (BMI) 30.0-30.9, adult: Secondary | ICD-10-CM

## 2017-10-31 DIAGNOSIS — I214 Non-ST elevation (NSTEMI) myocardial infarction: Secondary | ICD-10-CM | POA: Diagnosis not present

## 2017-10-31 MED ORDER — VITAMIN D (ERGOCALCIFEROL) 1.25 MG (50000 UNIT) PO CAPS: 50000.0000 [IU] | ORAL_CAPSULE | ORAL | 0 refills | Status: DC

## 2017-10-31 NOTE — Progress Notes (Signed)
Shane Wood 62 y.o. male DOB: Jan 08, 1956 MRN: [id number]      Nutrition Note  1. 08/23/17 NSTEMI (non-ST elevated myocardial infarction) ([place])   2. 08/23/17 Stented coronary artery    Past Medical History:  Diagnosis Date  . Allergy   . BACK PAIN   . ELEVATED BP READING WITHOUT DX HYPERTENSION 05/03/2008   Qualifier: Diagnosis of  By: Larose Kells MD, [place] GERD (gastroesophageal reflux disease)   . H/O cardiovascular stress test 2005    (-)  . Hyperlipidemia   . INSOMNIA-SLEEP DISORDER-UNSPEC   . PVC (premature ventricular contraction)    Meds reviewed. Lipitor, lopressor noted  HT: Ht Readings from Last 1 Encounters:  10/31/17 5\' 11"  (1.803 m)    WT: Wt Readings from Last 5 Encounters:  10/31/17 204 lb (92.5 kg)  10/31/17 207 lb 7.3 oz (94.1 kg)  10/14/17 213 lb 9.6 oz (96.9 kg)  10/08/17 210 lb (95.3 kg)  09/10/17 210 lb (95.3 kg)     Body mass index is 29.55 kg/m.   Current tobacco use? No   Labs:  Lipid Panel     Component Value Date/Time   CHOL 126 10/04/2017 0802   TRIG 143 10/04/2017 0802   HDL 33 (L) 10/04/2017 0802   CHOLHDL 3.8 10/04/2017 0802   CHOLHDL 4 07/15/2017 0828   VLDL 41.0 (H) 07/15/2017 0828   LDLCALC 64 10/04/2017 0802   LDLDIRECT 105.0 07/15/2017 0828    Lab Results  Component Value Date   HGBA1C 5.7 (H) 08/08/2017   CBG (last 3)  No results for input(s): GLUCAP in the last 72 hours.  Nutrition Note Spoke with pt. Nutrition plan and goals reviewed with pt. Pt is following Step 2 of the Therapeutic Lifestyle Changes diet. Pt wants to lose wt, ~20 lbs. Pt has been trying to lose wt by watching portion size and eating a mediterranean style meal plan. Wt loss tips reviewed. Pt is prediabetic. Last A1c 5.7 (08/08/17). Discussed managing prediabetes with a combination of nutrition and physical activity. Pt expressed that he feels comfortable managing his prediabetes, shared he has discussed this with providers previously. Pt is currently  following a mediterranean style of eating, aiming for ~1500 kcal daily. Shared he is working with the weight management center. Per discussion, pt does not use canned/convenience foods often. Pt rarely adds salt to food. Pt eats out infrequently, cooks the majority of his meals at home. Pt consumes 3 meals and 1 snack daily. Pt expressed understanding of the information reviewed. Pt aware of nutrition education classes offered and will attend as his schedule allows.  Nutrition Diagnosis ? Food-and nutrition-related knowledge deficit related to lack of exposure to information as related to diagnosis of: ? CVD ?Pre-DM ? Overweight related to excessive energy intake as evidenced by a Body mass index is 29.55 kg/m.  Nutrition Intervention ? Pt's individual nutrition plan and goals reviewed with pt. ? Pt given handouts for: ? Nutrition I class ? Nutrition II class  ? Diabetes Blitz Class ? Diabetes Q & A class  ? pre-diabetes  Nutrition Goal(s):   ? Pt to identify food quantities necessary to achieve weight loss of 6-20 lbs. at graduation from cardiac rehab. Goal wt of 20 lb desired.  ? Pt to try new mediterranean style recipes, explore a variety of food choices   Plan:  ? Pt to attend nutrition classes ? Nutrition I ? Nutrition II ? Portion Distortion  ? Will provide client-centered nutrition education  as part of interdisciplinary care ? Monitor and evaluate progress toward nutrition goal with team.   Laurina Bustle, MS, RD, LDN 10/31/2017 11:55 AM

## 2017-10-31 NOTE — Progress Notes (Signed)
Cardiac Individual Treatment Plan  Patient Details  Name: Shane Wood MRN: [id number] Date of Birth: 05-07-1955 Referring Provider:   Flowsheet Row CARDIAC REHAB PHASE II ORIENTATION from 10/31/2017 in [facility]  Referring Provider  Casandra Doffing MD      Initial Encounter Date:  [place] PHASE II ORIENTATION from 10/31/2017 in [facility]  Date  10/31/17      Visit Diagnosis: 08/23/17 NSTEMI (non-ST elevated myocardial infarction) ([place])  08/23/17 Stented coronary artery  Patient's Home Medications on Admission:  Current Outpatient Medications:  .  Ascorbic Acid (VITAMIN C) 1000 MG tablet, Take 1,000 mg by mouth daily., Disp: , Rfl:  .  aspirin EC 81 MG EC tablet, Take 1 tablet (81 mg total) by mouth daily., Disp: 30 tablet, Rfl: 11 .  atorvastatin (LIPITOR) 80 MG tablet, Take 1 tablet (80 mg total) by mouth daily at 6 PM., Disp: 30 tablet, Rfl: 11 .  Cyanocobalamin (VITAMIN B 12 PO), Take 1 tablet by mouth daily., Disp: , Rfl:  .  ketorolac (ACULAR) 0.5 % ophthalmic solution, Place 0.5 drops into both eyes as needed., Disp: , Rfl:  .  metoprolol tartrate (LOPRESSOR) 25 MG tablet, Take 0.5 tablets (12.5 mg total) by mouth 2 (two) times daily., Disp: 60 tablet, Rfl: 11 .  Multiple Vitamin (MULTIVITAMIN) tablet, Take 1 tablet by mouth daily.  , Disp: , Rfl:  .  nitroGLYCERIN (NITROSTAT) 0.4 MG SL tablet, Place 1 tablet (0.4 mg total) under the tongue every 5 (five) minutes x 3 doses as needed for chest pain., Disp: 25 tablet, Rfl: 11 .  omeprazole (PRILOSEC) 20 MG capsule, Take 20 mg by mouth daily., Disp: , Rfl:  .  ticagrelor (BRILINTA) 90 MG TABS tablet, Take 1 tablet (90 mg total) by mouth 2 (two) times daily., Disp: 60 tablet, Rfl: 0 .  Vitamin D, Ergocalciferol, (DRISDOL) 50000 units CAPS capsule, Take 1 capsule (50,000 Units total) by mouth every 7 (seven) days., Disp: 4 capsule, Rfl: 0  Past  Medical History: Past Medical History:  Diagnosis Date  . Allergy   . BACK PAIN   . ELEVATED BP READING WITHOUT DX HYPERTENSION 05/03/2008   Qualifier: Diagnosis of  By: Larose Kells MD, [place] GERD (gastroesophageal reflux disease)   . H/O cardiovascular stress test 2005    (-)  . Hyperlipidemia   . INSOMNIA-SLEEP DISORDER-UNSPEC   . PVC (premature ventricular contraction)     Tobacco Use: Social History   Tobacco Use  Smoking Status Former Smoker  . Last attempt to quit: 09/24/1988  . Years since quitting: 29.1  Smokeless Tobacco Former Systems developer  . Types: Chew  . Quit date: 1970    Labs: Recent Chemical engineer    Labs for ITP Cardiac and Pulmonary Rehab Latest Ref Rng & Units 07/08/2015 07/10/2016 07/15/2017 08/08/2017 10/04/2017   Cholestrol 100 - 199 mg/dL 209(H) 209(H) 170 - 126   LDLCALC 0 - 99 mg/dL 129(H) 124(H) - - 64   LDLDIRECT mg/dL - - 105.0 - -   HDL >39 mg/dL 44.30 46.60 39.80 - 33(L)   Trlycerides 0 - 149 mg/dL 179.0(H) 190.0(H) 205.0(H) - 143   Hemoglobin A1c 4.8 - 5.6 % - - - 5.7(H) -      Capillary Blood Glucose: No results found for: GLUCAP   Exercise Target Goals: Date: 10/31/17  Exercise Program Goal: Individual exercise prescription set using results from initial 6 min  walk test and THRR while considering  patient's activity barriers and safety.   Exercise Prescription Goal: Initial exercise prescription builds to 30-45 minutes a day of aerobic activity, 2-3 days per week.  Home exercise guidelines will be given to patient during program as part of exercise prescription that the participant will acknowledge.  Activity Barriers & Risk Stratification: Activity Barriers & Cardiac Risk Stratification - 10/31/17 0815    Activity Barriers & Cardiac Risk Stratification          Activity Barriers  Arthritis;Back Problems    Cardiac Risk Stratification  High           6 Minute Walk: 6 Minute Walk    6 Minute Walk    Row Name 10/31/17 1052   Phase   Initial   Distance  2049 feet   Walk Time  6 minutes   # of Rest Breaks  0   MPH  3.9   METS  4.6   RPE  11   VO2 Peak  16.2   Symptoms  No   Resting HR  57 bpm   Resting BP  118/70   Resting Oxygen Saturation   98 %   Exercise Oxygen Saturation  during 6 min walk  98 %   Max Ex. HR  97 bpm   Max Ex. BP  142/80   2 Minute Post BP  120/70          Oxygen Initial Assessment:   Oxygen Re-Evaluation:   Oxygen Discharge (Final Oxygen Re-Evaluation):   Initial Exercise Prescription: Initial Exercise Prescription - 10/31/17 1000    Date of Initial Exercise RX and Referring Provider          Date  10/31/17    Referring Provider  Casandra Doffing MD    Expected Discharge Date  02/02/18        Treadmill          MPH  3.3    Grade  1    Minutes  10    METs  3.98        NuStep          Level  4    SPM  80    Minutes  10    METs  3        Rower          Level  3    Watts  30    Minutes  10    METs  4.7        Prescription Details          Frequency (times per week)  3    Duration  Progress to 30 minutes of continuous aerobic without signs/symptoms of physical distress        Intensity          THRR 40-80% of Max Heartrate  63-126    Ratings of Perceived Exertion  11-13    Perceived Dyspnea  0-4        Progression          Progression  Continue to progress workloads to maintain intensity without signs/symptoms of physical distress.        Resistance Training          Training Prescription  Yes    Weight  5lbs    Reps  10-15           Perform Capillary Blood Glucose checks as needed.  Exercise Prescription Changes:   Exercise Comments:  Exercise Goals and Review: Exercise Goals    Exercise Goals    Row Name 10/31/17 0816   Increase Physical Activity  Yes   Intervention  Provide advice, education, support and counseling about physical activity/exercise needs.;Develop an individualized exercise prescription for aerobic and  resistive training based on initial evaluation findings, risk stratification, comorbidities and participant's personal goals.   Expected Outcomes  Short Term: Attend rehab on a regular basis to increase amount of physical activity.;Long Term: Add in home exercise to make exercise part of routine and to increase amount of physical activity.;Long Term: Exercising regularly at least 3-5 days a week.   Increase Strength and Stamina  Yes   Intervention  Provide advice, education, support and counseling about physical activity/exercise needs.;Develop an individualized exercise prescription for aerobic and resistive training based on initial evaluation findings, risk stratification, comorbidities and participant's personal goals.   Expected Outcomes  Short Term: Increase workloads from initial exercise prescription for resistance, speed, and METs.;Short Term: Perform resistance training exercises routinely during rehab and add in resistance training at home;Long Term: Improve cardiorespiratory fitness, muscular endurance and strength as measured by increased METs and functional capacity (6MWT)   Able to understand and use rate of perceived exertion (RPE) scale  Yes   Intervention  Provide education and explanation on how to use RPE scale   Expected Outcomes  Short Term: Able to use RPE daily in rehab to express subjective intensity level;Long Term:  Able to use RPE to guide intensity level when exercising independently   Knowledge and understanding of Target Heart Rate Range (THRR)  Yes   Intervention  Provide education and explanation of THRR including how the numbers were predicted and where they are located for reference   Expected Outcomes  Short Term: Able to state/look up THRR;Long Term: Able to use THRR to govern intensity when exercising independently;Short Term: Able to use daily as guideline for intensity in rehab   Able to check pulse independently  Yes   Intervention  Provide education and  demonstration on how to check pulse in carotid and radial arteries.;Review the importance of being able to check your own pulse for safety during independent exercise   Expected Outcomes  Short Term: Able to explain why pulse checking is important during independent exercise;Long Term: Able to check pulse independently and accurately   Understanding of Exercise Prescription  Yes   Intervention  Provide education, explanation, and written materials on patient's individual exercise prescription   Expected Outcomes  Short Term: Able to explain program exercise prescription;Long Term: Able to explain home exercise prescription to exercise independently          Exercise Goals Re-Evaluation :    Discharge Exercise Prescription (Final Exercise Prescription Changes):   Nutrition:  Target Goals: Understanding of nutrition guidelines, daily intake of sodium 1500mg , cholesterol 200mg , calories 30% from fat and 7% or less from saturated fats, daily to have 5 or more servings of fruits and vegetables.  Biometrics: Pre Biometrics - 10/31/17 1107    Pre Biometrics          Height  5' 10.25" (1.784 m)    Weight  207 lb 7.3 oz (94.1 kg)    Waist Circumference  40 inches    Hip Circumference  41.5 inches    Waist to Hip Ratio  0.96 %    BMI (Calculated)  29.57    Triceps Skinfold  23 mm    % Body Fat  29.5 %    Grip  Strength  43 kg    Flexibility  12 in    Single Leg Stand  30 seconds            Nutrition Therapy Plan and Nutrition Goals:   Nutrition Assessments:   Nutrition Goals Re-Evaluation:   Nutrition Goals Re-Evaluation:   Nutrition Goals Discharge (Final Nutrition Goals Re-Evaluation):   Psychosocial: Target Goals: Acknowledge presence or absence of significant depression and/or stress, maximize coping skills, provide positive support system. Participant is able to verbalize types and ability to use techniques and skills needed for reducing stress and  depression.  Initial Review & Psychosocial Screening: Initial Psych Review & Screening - 10/31/17 0754    Initial Review          Current issues with  None Identified        Family Dynamics          Good Support System?  Yes spouse         Barriers          Psychosocial barriers to participate in program  There are no identifiable barriers or psychosocial needs.        Screening Interventions          Interventions  Encouraged to exercise           Quality of Life Scores: Quality of Life - 10/31/17 0754    Quality of Life          Select  Quality of Life        Quality of Life Scores          Health/Function Pre  22.4 %    Socioeconomic Pre  25.75 %    Psych/Spiritual Pre  22.29 %    Family Pre  28.8 %    GLOBAL Pre  24.07 %          Scores of 19 and below usually indicate a poorer quality of life in these areas.  A difference of  2-3 points is a clinically meaningful difference.  A difference of 2-3 points in the total score of the Quality of Life Index has been associated with significant improvement in overall quality of life, self-image, physical symptoms, and general health in studies assessing change in quality of life.  PHQ-9: Recent Review Flowsheet Data    Depression screen [facility] 2/9 08/08/2017 03/06/2017 07/08/2015   Decreased Interest 1 0 0   Down, Depressed, Hopeless 1 0 0   PHQ - 2 Score 2 0 0   Altered sleeping 2 - -   Tired, decreased energy 2 - -   Change in appetite 1 - -   Feeling bad or failure about yourself  1 - -   Trouble concentrating 1 - -   Moving slowly or fidgety/restless 0 - -   Suicidal thoughts 0 - -   PHQ-9 Score 9 - -   Difficult doing work/chores Not difficult at all - -     Interpretation of Total Score  Total Score Depression Severity:  1-4 = Minimal depression, 5-9 = Mild depression, 10-14 = Moderate depression, 15-19 = Moderately severe depression, 20-27 = Severe depression   Psychosocial Evaluation and  Intervention:   Psychosocial Re-Evaluation:   Psychosocial Discharge (Final Psychosocial Re-Evaluation):   Vocational Rehabilitation: Provide vocational rehab assistance to qualifying candidates.   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 Participant will state understanding/return demonstration of topics presented.  Learning Barriers/Preferences: Learning Barriers/Preferences - 10/31/17 2979    Learning Barriers/Preferences          Learning Barriers  Sight    Learning Preferences  Skilled Demonstration           Education Topics: Count Your Pulse:  -Group instruction provided by verbal instruction, demonstration, patient participation and written materials to support subject.  Instructors address importance of being able to find your pulse and how to count your pulse when at home without a heart monitor.  Patients get hands on experience counting their pulse with staff help and individually.   Heart Attack, Angina, and Risk Factor Modification:  -Group instruction provided by verbal instruction, video, and written materials to support subject.  Instructors address signs and symptoms of angina and heart attacks.    Also discuss risk factors for heart disease and how to make changes to improve heart health risk factors.   Functional Fitness:  -Group instruction provided by verbal instruction, demonstration, patient participation, and written materials to support subject.  Instructors address safety measures for doing things around the house.  Discuss how to get up and down off the floor, how to pick things up properly, how to safely get out of a chair without assistance, and balance  training.   Meditation and Mindfulness:  -Group instruction provided by verbal instruction, patient participation, and written materials to support subject.  Instructor addresses importance of mindfulness and meditation practice to help reduce stress and improve awareness.  Instructor also leads participants through a meditation exercise.    Stretching for Flexibility and Mobility:  -Group instruction provided by verbal instruction, patient participation, and written materials to support subject.  Instructors lead participants through series of stretches that are designed to increase flexibility thus improving mobility.  These stretches are additional exercise for major muscle groups that are typically performed during regular warm up and cool down.   Hands Only CPR:  -Group verbal, video, and participation provides a basic overview of AHA guidelines for community CPR. Role-play of emergencies allow participants the opportunity to practice calling for help and chest compression technique with discussion of AED use.   Hypertension: -Group verbal and written instruction that provides a basic overview of hypertension including the most recent diagnostic guidelines, risk factor reduction with self-care instructions and medication management.    Nutrition I class: Heart Healthy Eating:  -Group instruction provided by PowerPoint slides, verbal discussion, and written materials to support subject matter. The instructor gives an explanation and review of the Therapeutic Lifestyle Changes diet recommendations, which includes a discussion on lipid goals, dietary fat, sodium, fiber, plant stanol/sterol esters, sugar, and the components of a well-balanced, healthy diet.   Nutrition II class: Lifestyle Skills:  -Group instruction provided by PowerPoint slides, verbal discussion, and written materials to support subject matter. The instructor gives an explanation and review of label reading, grocery  shopping for heart health, heart healthy recipe modifications, and ways to make healthier choices when eating out.   Diabetes Question & Answer:  -Group instruction provided by PowerPoint slides, verbal discussion, and written materials to support subject matter. The instructor gives an explanation and review of diabetes co-morbidities, pre- and post-prandial blood glucose goals, pre-exercise blood glucose goals, signs, symptoms, and  treatment of hypoglycemia and hyperglycemia, and foot care basics.   Diabetes Blitz:  -Group instruction provided by PowerPoint slides, verbal discussion, and written materials to support subject matter. The instructor gives an explanation and review of the physiology behind type 1 and type 2 diabetes, diabetes medications and rational behind using different medications, pre- and post-prandial blood glucose recommendations and Hemoglobin A1c goals, diabetes diet, and exercise including blood glucose guidelines for exercising safely.    Portion Distortion:  -Group instruction provided by PowerPoint slides, verbal discussion, written materials, and food models to support subject matter. The instructor gives an explanation of serving size versus portion size, changes in portions sizes over the last 20 years, and what consists of a serving from each food group.   Stress Management:  -Group instruction provided by verbal instruction, video, and written materials to support subject matter.  Instructors review role of stress in heart disease and how to cope with stress positively.     Exercising on Your Own:  -Group instruction provided by verbal instruction, power point, and written materials to support subject.  Instructors discuss benefits of exercise, components of exercise, frequency and intensity of exercise, and end points for exercise.  Also discuss use of nitroglycerin and activating EMS.  Review options of places to exercise outside of rehab.  Review guidelines  for sex with heart disease.   Cardiac Drugs I:  -Group instruction provided by verbal instruction and written materials to support subject.  Instructor reviews cardiac drug classes: antiplatelets, anticoagulants, beta blockers, and statins.  Instructor discusses reasons, side effects, and lifestyle considerations for each drug class.   Cardiac Drugs II:  -Group instruction provided by verbal instruction and written materials to support subject.  Instructor reviews cardiac drug classes: angiotensin converting enzyme inhibitors (ACE-I), angiotensin II receptor blockers (ARBs), nitrates, and calcium channel blockers.  Instructor discusses reasons, side effects, and lifestyle considerations for each drug class.   Anatomy and Physiology of the Circulatory System:  Group verbal and written instruction and models provide basic cardiac anatomy and physiology, with the coronary electrical and arterial systems. Review of: AMI, Angina, Valve disease, Heart Failure, Peripheral Artery Disease, Cardiac Arrhythmia, Pacemakers, and the ICD.   Other Education:  -Group or individual verbal, written, or video instructions that support the educational goals of the cardiac rehab program.   Holiday Eating Survival Tips:  -Group instruction provided by PowerPoint slides, verbal discussion, and written materials to support subject matter. The instructor gives patients tips, tricks, and techniques to help them not only survive but enjoy the holidays despite the onslaught of food that accompanies the holidays.   Knowledge Questionnaire Score: Knowledge Questionnaire Score - 10/31/17 0753    Knowledge Questionnaire Score          Pre Score  23/24           Core Components/Risk Factors/Patient Goals at Admission: Personal Goals and Risk Factors at Admission - 10/31/17 1108    Core Components/Risk Factors/Patient Goals on Admission           Weight Management  Yes;Weight Maintenance;Weight Loss     Intervention  Weight Management: Develop a combined nutrition and exercise program designed to reach desired caloric intake, while maintaining appropriate intake of nutrient and fiber, sodium and fats, and appropriate energy expenditure required for the weight goal.;Weight Management: Provide education and appropriate resources to help participant work on and attain dietary goals.;Weight Management/Obesity: Establish reasonable short term and long term weight goals.    Admit Weight  207 lb 7.3 oz (94.1 kg)    Goal Weight: Short Term  200 lb (90.7 kg)    Goal Weight: Long Term  190 lb (86.2 kg)    Expected Outcomes  Short Term: Continue to assess and modify interventions until short term weight is achieved;Long Term: Adherence to nutrition and physical activity/exercise program aimed toward attainment of established weight goal;Weight Maintenance: Understanding of the daily nutrition guidelines, which includes 25-35% calories from fat, 7% or less cal from saturated fats, less than 200mg  cholesterol, less than 1.5gm of sodium, & 5 or more servings of fruits and vegetables daily;Weight Loss: Understanding of general recommendations for a balanced deficit meal plan, which promotes 1-2 lb weight loss per week and includes a negative energy balance of [phone] kcal/d;Understanding recommendations for meals to include 15-35% energy as protein, 25-35% energy from fat, 35-60% energy from carbohydrates, less than 200mg  of dietary cholesterol, 20-35 gm of total fiber daily;Understanding of distribution of calorie intake throughout the day with the consumption of 4-5 meals/snacks    Hypertension  Yes    Intervention  Provide education on lifestyle modifcations including regular physical activity/exercise, weight management, moderate sodium restriction and increased consumption of fresh fruit, vegetables, and low fat dairy, alcohol moderation, and smoking cessation.;Monitor prescription use compliance.    Expected  Outcomes  Short Term: Continued assessment and intervention until BP is < 140/55mm HG in hypertensive participants. < 130/22mm HG in hypertensive participants with diabetes, heart failure or chronic kidney disease.;Long Term: Maintenance of blood pressure at goal levels.    Lipids  Yes    Intervention  Provide education and support for participant on nutrition & aerobic/resistive exercise along with prescribed medications to achieve LDL 70mg , HDL >40mg .    Expected Outcomes  Short Term: Participant states understanding of desired cholesterol values and is compliant with medications prescribed. Participant is following exercise prescription and nutrition guidelines.;Long Term: Cholesterol controlled with medications as prescribed, with individualized exercise RX and with personalized nutrition plan. Value goals: LDL < 70mg , HDL > 40 mg.    Stress  Yes    Intervention  Offer individual and/or small group education and counseling on adjustment to heart disease, stress management and health-related lifestyle change. Teach and support self-help strategies.;Refer participants experiencing significant psychosocial distress to appropriate mental health specialists for further evaluation and treatment. When possible, include family members and significant others in education/counseling sessions.    Expected Outcomes  Short Term: Participant demonstrates changes in health-related behavior, relaxation and other stress management skills, ability to obtain effective social support, and compliance with psychotropic medications if prescribed.;Long Term: Emotional wellbeing is indicated by absence of clinically significant psychosocial distress or social isolation.    Personal Goal Other  Yes    Personal Goal  Avoid future occurrences and hospitalization. Live a heart healthy lifestyle    Intervention  Provide education and support regarding risk factor modifications, sign and symptoms of CVD's, medication management,  stress management and exercise programming to encourage heart healthy living.    Expected Outcomes  Pt will make necessary changes to lifestyle to decrease future occurrences and modifiable risk factors.            Core Components/Risk Factors/Patient Goals Review:    Core Components/Risk Factors/Patient Goals at Discharge (Final Review):    ITP Comments: ITP Comments    Row Name 10/31/17 0750   ITP Comments  Dr.Traci Radford Pax, Medical Director       Comments: Patient attended orientation from [phone] to [phone] to review  rules and guidelines for program. Completed 6 minute walk test, Intitial ITP, and exercise prescription.  VSS. Telemetry-sinus bradycardia.   Asymptomatic. Andi Hence, RN, BSN Cardiac Pulmonary Rehab 10/31/17 11:39 AM

## 2017-10-31 NOTE — Progress Notes (Signed)
Office: [phone]  /  Fax: [phone]   HPI:   Chief Complaint: OBESITY Shane Wood is here to discuss his progress with his obesity treatment plan. He is on the  keep a food journal with 1500-1600 calories and 100 g of protein daily and is following his eating plan approximately 80 % of the time. He states he is exercising 30 to 45 minutes 5 to 6 times per week. Shane Wood continues to do very well with diet, exercise and weight loss. He is doing cardiac rehab and feeling better. His weight is 204 lb (92.5 kg) today and has had a weight loss of 6 pounds over a period of 3 week since his last visit. He has lost 12 lbs since starting treatment with [name].  Vitamin D deficiency Shane Wood has a diagnosis of vitamin D deficiency. He is still on vit D, but he is still not at goal and he denies nausea, vomiting or muscle weakness.  At risk for osteopenia and osteoporosis Shane Wood is at higher risk of osteopenia and osteoporosis due to vitamin D deficiency.   ALLERGIES: No Known Allergies  MEDICATIONS: Current Outpatient Medications on File Prior to Visit  Medication Sig Dispense Refill  . Ascorbic Acid (VITAMIN C) 1000 MG tablet Take 1,000 mg by mouth daily.    [name] aspirin EC 81 MG EC tablet Take 1 tablet (81 mg total) by mouth daily. 30 tablet 11  . atorvastatin (LIPITOR) 80 MG tablet Take 1 tablet (80 mg total) by mouth daily at 6 PM. 30 tablet 11  . Cyanocobalamin (VITAMIN B 12 PO) Take 1 tablet by mouth daily.    [name] ketorolac (ACULAR) 0.5 % ophthalmic solution Place 0.5 drops into both eyes as needed.    . metoprolol tartrate (LOPRESSOR) 25 MG tablet Take 0.5 tablets (12.5 mg total) by mouth 2 (two) times daily. 60 tablet 11  . Multiple Vitamin (MULTIVITAMIN) tablet Take 1 tablet by mouth daily.      . nitroGLYCERIN (NITROSTAT) 0.4 MG SL tablet Place 1 tablet (0.4 mg total) under the tongue every 5 (five) minutes x 3 doses as needed for chest pain. 25 tablet 11  . omeprazole (PRILOSEC) 20 MG  capsule Take 20 mg by mouth daily.    . ticagrelor (BRILINTA) 90 MG TABS tablet Take 1 tablet (90 mg total) by mouth 2 (two) times daily. 60 tablet 0  . Vitamin D, Ergocalciferol, (DRISDOL) 50000 units CAPS capsule Take 1 capsule (50,000 Units total) by mouth every 7 (seven) days. 4 capsule 0   No current facility-administered medications on file prior to visit.     PAST MEDICAL HISTORY: Past Medical History:  Diagnosis Date  . Allergy   . BACK PAIN   . ELEVATED BP READING WITHOUT DX HYPERTENSION 05/03/2008   Qualifier: Diagnosis of  By: Larose Kells MD, [place] GERD (gastroesophageal reflux disease)   . H/O cardiovascular stress test 2005    (-)  . Hyperlipidemia   . INSOMNIA-SLEEP DISORDER-UNSPEC   . PVC (premature ventricular contraction)     PAST SURGICAL HISTORY: Past Surgical History:  Procedure Laterality Date  . ARTERY REPAIR Right 08/23/2017   Procedure: EXPLORATION RIGHT RADIAL ARTERY,  RIGHT FOREARM FASCIOTOMY;  Surgeon: Elam Dutch, MD;  [facility]: [facility] : [facility] OR;  Service: Vascular;  Laterality: Right;  . CHOLECYSTECTOMY    . CORONARY STENT INTERVENTION N/A 08/23/2017   Procedure: CORONARY STENT INTERVENTION;  Surgeon: Jettie Booze, MD;  Location: [facility];  Service: Cardiovascular;  Laterality: N/A;  . LEFT HEART CATH AND CORONARY ANGIOGRAPHY N/A 08/23/2017   Procedure: LEFT HEART CATH AND CORONARY ANGIOGRAPHY;  Surgeon: Jettie Booze, MD;  Location: [facility];  Service: Cardiovascular;  Laterality: N/A;  . POLYPECTOMY    . TONSILLECTOMY     age 62 y/o    SOCIAL HISTORY: Social History   Tobacco Use  . Smoking status: Former Smoker    Last attempt to quit: 09/24/1988    Years since quitting: 29.1  . Smokeless tobacco: Former Systems developer    Types: Chew    Quit date: 1970  Substance Use Topics  . Alcohol use: Yes    Alcohol/week: 2.4 oz    Types: 4 Glasses of wine per week    Comment: occasionally on weekends  . Drug use: No    FAMILY  HISTORY: Family History  Problem Relation Age of Onset  . Lung cancer Father        smoker  . Diabetes Maternal Grandmother   . Diabetes Maternal Grandfather   . Hypertension Mother   . Depression Mother   . Obesity Mother   . Heart attack Other        uncle MI at age 72s  . Stroke Other        GM, in her 90  . Colon cancer Neg Hx   . Prostate cancer Neg Hx   . Colon polyps Neg Hx   . Esophageal cancer Neg Hx   . Rectal cancer Neg Hx   . Stomach cancer Neg Hx   . Pancreatic cancer Neg Hx     ROS: Review of Systems  Constitutional: Positive for weight loss.  Gastrointestinal: Negative for nausea and vomiting.  Musculoskeletal:       Negative for muscle weakness    PHYSICAL EXAM: Blood pressure 104/66, pulse (!) 57, temperature 98.4 F (36.9 C), temperature source Oral, height 5\' 11"  (1.803 m), weight 204 lb (92.5 kg), SpO2 96 %. Body mass index is 28.45 kg/m. Physical Exam  Constitutional: He is oriented to person, place, and time. He appears well-developed and well-nourished.  Cardiovascular: Normal rate.  Pulmonary/Chest: Effort normal.  Musculoskeletal: Normal range of motion.  Neurological: He is oriented to person, place, and time.  Skin: Skin is warm and dry.  Psychiatric: He has a normal mood and affect. His behavior is normal.  Vitals reviewed.   RECENT LABS AND TESTS: BMET    Component Value Date/Time   NA 137 08/24/2017 0257   NA 142 08/08/2017 1016   K 4.2 08/24/2017 0257   CL 107 08/24/2017 0257   CO2 23 08/24/2017 0257   GLUCOSE 149 (H) 08/24/2017 0257   BUN 18 08/24/2017 0257   BUN 24 08/08/2017 1016   CREATININE 0.99 08/24/2017 0257   CALCIUM 8.3 (L) 08/24/2017 0257   GFRNONAA >60 08/24/2017 0257   GFRAA >60 08/24/2017 0257   Lab Results  Component Value Date   HGBA1C 5.7 (H) 08/08/2017   Lab Results  Component Value Date   INSULIN 11.8 08/08/2017   CBC    Component Value Date/Time   WBC 12.1 (H) 08/24/2017 0257   RBC 3.93 (L)  08/24/2017 0257   HGB 11.0 (L) 08/24/2017 0257   HCT 33.6 (L) 08/24/2017 0257   PLT 204 08/24/2017 0257   MCV 85.5 08/24/2017 0257   MCH 28.0 08/24/2017 0257   MCHC 32.7 08/24/2017 0257   RDW 14.8 08/24/2017 0257   LYMPHSABS 1.7 07/15/2017 0828   MONOABS  0.4 07/15/2017 0828   EOSABS 0.2 07/15/2017 0828   BASOSABS 0.0 07/15/2017 0828   Iron/TIBC/Ferritin/ %Sat No results found for: IRON, TIBC, FERRITIN, IRONPCTSAT Lipid Panel     Component Value Date/Time   CHOL 126 10/04/2017 0802   TRIG 143 10/04/2017 0802   HDL 33 (L) 10/04/2017 0802   CHOLHDL 3.8 10/04/2017 0802   CHOLHDL 4 07/15/2017 0828   VLDL 41.0 (H) 07/15/2017 0828   LDLCALC 64 10/04/2017 0802   LDLDIRECT 105.0 07/15/2017 0828   Hepatic Function Panel     Component Value Date/Time   PROT 6.5 10/04/2017 0802   ALBUMIN 4.3 10/04/2017 0802   AST 16 10/04/2017 0802   ALT 27 10/04/2017 0802   ALKPHOS 56 10/04/2017 0802   BILITOT 0.4 10/04/2017 0802   BILIDIR 0.11 10/04/2017 0802      Component Value Date/Time   TSH 2.080 08/08/2017 1016   TSH 2.14 07/15/2017 0828   TSH 1.62 07/08/2015 1405  Results for Bambrick, Jatavis A "PAT" (MRN [id number]) as of 10/31/2017 12:14  Ref. Range 08/08/2017 10:16  Vitamin D, 25-Hydroxy Latest Ref Range: 30.0 - 100.0 ng/mL 21.6 (L)    ASSESSMENT AND PLAN: Vitamin D deficiency - Plan: Vitamin D, Ergocalciferol, (DRISDOL) 50000 units CAPS capsule  At risk for osteoporosis  Class 1 obesity with serious comorbidity and body mass index (BMI) of 30.0 to 30.9 in adult, unspecified obesity type - BMI greater than 30 at start of program   PLAN:  Vitamin D Deficiency Ranald was informed that low vitamin D levels contributes to fatigue and are associated with obesity, breast, and colon cancer. He agrees to continue to take prescription Vit D @50 ,000 IU every week #4 with no refills. We will recheck labs in one month and he will follow up for routine testing of vitamin D, at least 2-3  times per year. He was informed of the risk of over-replacement of vitamin D and agrees to not increase his dose unless he discusses this with [name] first. Osha agrees to follow up as directed.   At risk for osteopenia and osteoporosis Melesio is at risk for osteopenia and osteoporosis due to his vitamin D deficiency. He was encouraged to take his vitamin D and follow his higher calcium diet and increase strengthening exercise to help strengthen his bones and decrease his risk of osteopenia and osteoporosis.  Obesity Larnell is currently in the action stage of change. As such, his goal is to continue with weight loss efforts He has agreed to keep a food journal with 1500-1600 calories and 100 grams of  protein daily. Josejuan has been instructed to work up to a goal of 150 minutes of combined cardio and strengthening exercise per week for weight loss and overall health benefits. We discussed the following Behavioral Modification Strategies today: keeping healthy food in the home,  increasing vegetables, increasing fiber rich foods and decreasing sodium intake  Jex has agreed to follow up with our clinic in 3-4 weeks fasting. He was informed of the importance of frequent follow up visits to maximize his success with intensive lifestyle modifications for his multiple health conditions.   OBESITY BEHAVIORAL INTERVENTION VISIT  Today's visit was # 5 out of 22.  Starting weight: 216 Starting date: 08/08/17 Today's weight : 204 Today's date: 10/31/2017 Total lbs lost to date: 12 (Patients must lose 7 lbs in the first 6 months to continue with counseling)   ASK: We discussed the diagnosis of obesity with [name] today and  Loyed agreed to give [name] permission to discuss obesity behavioral modification therapy today.  ASSESS: Suyash has the diagnosis of obesity and his BMI today is 28.46 Taaj is in the action stage of change   ADVISE: Nnamdi was educated on the multiple  health risks of obesity as well as the benefit of weight loss to improve his health. He was advised of the need for long term treatment and the importance of lifestyle modifications.  AGREE: Multiple dietary modification options and treatment options were discussed and  Tequan agreed to the above obesity treatment plan.  I, Doreene Nest, am acting as transcriptionist for Dennard Nip, MD  I have reviewed the above documentation for accuracy and completeness, and I agree with the above. -Dennard Nip, MD

## 2017-11-04 ENCOUNTER — Encounter (HOSPITAL_COMMUNITY): Payer: Self-pay

## 2017-11-04 ENCOUNTER — Encounter (HOSPITAL_COMMUNITY)
Admission: RE | Admit: 2017-11-04 | Discharge: 2017-11-04 | Disposition: A | Discharge status: Home / Self Care | Payer: BLUE CROSS/BLUE SHIELD | Source: Ambulatory Visit | Attending: Interventional Cardiology | Admitting: Interventional Cardiology

## 2017-11-04 ENCOUNTER — Telehealth: Payer: Self-pay | Admitting: Interventional Cardiology

## 2017-11-04 DIAGNOSIS — K219 Gastro-esophageal reflux disease without esophagitis: Secondary | ICD-10-CM | POA: Diagnosis not present

## 2017-11-04 DIAGNOSIS — E785 Hyperlipidemia, unspecified: Secondary | ICD-10-CM | POA: Diagnosis not present

## 2017-11-04 DIAGNOSIS — Z955 Presence of coronary angioplasty implant and graft: Secondary | ICD-10-CM

## 2017-11-04 DIAGNOSIS — I214 Non-ST elevation (NSTEMI) myocardial infarction: Secondary | ICD-10-CM | POA: Diagnosis not present

## 2017-11-04 DIAGNOSIS — Z87891 Personal history of nicotine dependence: Secondary | ICD-10-CM | POA: Diagnosis not present

## 2017-11-04 NOTE — Progress Notes (Signed)
Daily Session Note  Patient Details  Name: Shane Wood MRN: [id number] Date of Birth: [birth year] Referring Provider:     CARDIAC REHAB PHASE II ORIENTATION from 10/31/2017 in [facility]  Referring Provider  Casandra Doffing MD      Encounter Date: 11/04/2017  Check In: Session Check In - 11/04/17 0745      Check-In   Staff Present  Seward Carol, MS, ACSM CEP, Exercise Physiologist;Sonda Coppens Karle Starch, RN Mosie Epstein, MS,ACSM CEP, Exercise Physiologist    Supervising physician immediately available to respond to emergencies  [facility] immediately available    Physician(s)  Dr. Darrick Meigs    Medication changes reported      No    Fall or balance concerns reported     No    Tobacco Cessation  No Change    Warm-up and Cool-down  Performed as group-led instruction    Resistance Training Performed  Yes    VAD Patient?  No    PAD/SET Patient?  No      Pain Assessment   Currently in Pain?  No/denies    Multiple Pain Sites  No       Capillary Blood Glucose: No results found for this or any previous visit (from the past 24 hour(s)).    Social History   Tobacco Use  Smoking Status Former Smoker  . Last attempt to quit: 09/24/1988  . Years since quitting: 29.1  Smokeless Tobacco Former Systems developer  . Types: Chew  . Quit date: 1970    Goals Met:  Exercise tolerated well  Goals Unmet:  Not Applicable  Comments: Pt started cardiac rehab today.  Pt tolerated light exercise without difficulty. VSS, telemetry-SR, asymptomatic.  Medication list reconciled. Pt denies barriers to medicaiton compliance.  PSYCHOSOCIAL ASSESSMENT:  PHQ-0. Pt exhibits positive coping skills, hopeful outlook with supportive family. No psychosocial needs identified at this time, no psychosocial interventions necessary.  Pt oriented to exercise equipment and routine.    Understanding verbalized.    Dr. Fransico Him is Medical Director for Cardiac Rehab at [facility].

## 2017-11-04 NOTE — Telephone Encounter (Signed)
Patient is at dental office for cleaning. 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 Myrla Halsted office is calling to find out if it is ok for patient to have dental cleaning. Reviewed with Dr. Radford Pax, DOD that patient had MI and stents and has dental cleaning planned for today. Per Dr. Radford Pax, patient is ok to have cleaning.  The waiting period is 4 weeks because it takes 4 weeks for stent to endothelialize. Reviewed with Jenny Reichmann who asked me to fax documentation to keep in patient's chart.  Fax # is [phone].

## 2017-11-04 NOTE — Telephone Encounter (Signed)
[place] Hygienist is calling to see if the patient can have a cleaning. Patient is there in the office now.

## 2017-11-06 ENCOUNTER — Encounter (HOSPITAL_COMMUNITY)
Admission: RE | Admit: 2017-11-06 | Discharge: 2017-11-06 | Disposition: A | Discharge status: Home / Self Care | Payer: BLUE CROSS/BLUE SHIELD | Source: Ambulatory Visit | Attending: Interventional Cardiology | Admitting: Interventional Cardiology

## 2017-11-06 VITALS — Ht 71.0 in | Wt 192.0 lb

## 2017-11-06 DIAGNOSIS — K219 Gastro-esophageal reflux disease without esophagitis: Secondary | ICD-10-CM | POA: Diagnosis not present

## 2017-11-06 DIAGNOSIS — Z955 Presence of coronary angioplasty implant and graft: Secondary | ICD-10-CM

## 2017-11-06 DIAGNOSIS — Z87891 Personal history of nicotine dependence: Secondary | ICD-10-CM | POA: Diagnosis not present

## 2017-11-06 DIAGNOSIS — E785 Hyperlipidemia, unspecified: Secondary | ICD-10-CM | POA: Diagnosis not present

## 2017-11-06 DIAGNOSIS — I214 Non-ST elevation (NSTEMI) myocardial infarction: Secondary | ICD-10-CM | POA: Diagnosis not present

## 2017-11-06 NOTE — Progress Notes (Signed)
Shane Wood 62 y.o. male Nutrition Note Spoke with pt. Nutrition Plan and Nutrition Survey goals reviewed with pt. Pt is following a Heart Healthy diet. Pt wants to lose wt. Pt has been trying to lose wt by following mediterranean style meal plan, eating ~1500 kcal daily, and he continues to work with weight management center. Last A1c indicates pt is prediabetic, discussed trading complex carbohydrates for refined grains and simple sugars to help manage.Pt expressed that he feels comfortable managing his prediabetes has discussed with providers already. Per discussion, pt does not use canned/convenience foods often. Pt rarely adds salt to food. Pt eats out infrequently. Set goal with patient to continue weight loss and try new mediterranean style recipes, explore a variety of food choices to manage boredom and promote sustainability over time. Pt expressed understanding of the information reviewed. Pt aware of nutrition education classes offered plans on attending nutrition classes as able.  Lab Results  Component Value Date   HGBA1C 5.7 (H) 08/08/2017    Wt Readings from Last 3 Encounters:  11/06/17 192 lb 0.3 oz (87.1 kg)  10/31/17 207 lb 7.3 oz (94.1 kg)  10/31/17 204 lb (92.5 kg)    Nutrition Diagnosis ? Food-and nutrition-related knowledge deficit related to lack of exposure to information as related to diagnosis of: ? CVD ? Pre-DM ? Overweight related to excessive energy intake as evidenced by a BMI of Body mass index is 26.78 kg/m.  Nutrition Intervention ? Pt's individual nutrition plan reviewed with pt. ? Benefits of adopting Heart Healthy diet discussed when Medficts reviewed.   ? Pt given handouts for: ? mediterranean style recipes ? Continue client-centered nutrition education by RD, as part of interdisciplinary care.  Goal(s)  ? Pt to identify food quantities necessary to achieve weight loss of 6-24 lb at graduation from cardiac rehab.  ? Pt to try new mediterranean  style recipes, explore a variety of food choices  Plan:  Pt to attend nutrition classes ? Nutrition I ? Nutrition II ? Portion Distortion  Will provide client-centered nutrition education as part of interdisciplinary care.   Monitor and evaluate progress toward nutrition goal with team.   Laurina Bustle, MS, RD, LDN 11/06/2017 8:05 AM

## 2017-11-08 ENCOUNTER — Encounter (HOSPITAL_COMMUNITY)
Admission: RE | Admit: 2017-11-08 | Discharge: 2017-11-08 | Disposition: A | Discharge status: Home / Self Care | Payer: BLUE CROSS/BLUE SHIELD | Source: Ambulatory Visit | Attending: Interventional Cardiology | Admitting: Interventional Cardiology

## 2017-11-08 DIAGNOSIS — Z87891 Personal history of nicotine dependence: Secondary | ICD-10-CM | POA: Diagnosis not present

## 2017-11-08 DIAGNOSIS — Z955 Presence of coronary angioplasty implant and graft: Secondary | ICD-10-CM | POA: Diagnosis not present

## 2017-11-08 DIAGNOSIS — E785 Hyperlipidemia, unspecified: Secondary | ICD-10-CM | POA: Diagnosis not present

## 2017-11-08 DIAGNOSIS — K219 Gastro-esophageal reflux disease without esophagitis: Secondary | ICD-10-CM | POA: Diagnosis not present

## 2017-11-08 DIAGNOSIS — I214 Non-ST elevation (NSTEMI) myocardial infarction: Secondary | ICD-10-CM | POA: Diagnosis not present

## 2017-11-11 ENCOUNTER — Encounter (HOSPITAL_COMMUNITY)
Admission: RE | Admit: 2017-11-11 | Discharge: 2017-11-11 | Disposition: A | Discharge status: Home / Self Care | Payer: BLUE CROSS/BLUE SHIELD | Source: Ambulatory Visit | Attending: Interventional Cardiology | Admitting: Interventional Cardiology

## 2017-11-11 DIAGNOSIS — I214 Non-ST elevation (NSTEMI) myocardial infarction: Secondary | ICD-10-CM

## 2017-11-11 DIAGNOSIS — Z955 Presence of coronary angioplasty implant and graft: Secondary | ICD-10-CM

## 2017-11-13 ENCOUNTER — Encounter (HOSPITAL_COMMUNITY)
Admission: RE | Admit: 2017-11-13 | Discharge: 2017-11-13 | Disposition: A | Discharge status: Home / Self Care | Payer: BLUE CROSS/BLUE SHIELD | Source: Ambulatory Visit | Attending: Interventional Cardiology | Admitting: Interventional Cardiology

## 2017-11-13 DIAGNOSIS — K219 Gastro-esophageal reflux disease without esophagitis: Secondary | ICD-10-CM | POA: Diagnosis not present

## 2017-11-13 DIAGNOSIS — I214 Non-ST elevation (NSTEMI) myocardial infarction: Secondary | ICD-10-CM | POA: Diagnosis not present

## 2017-11-13 DIAGNOSIS — Z955 Presence of coronary angioplasty implant and graft: Secondary | ICD-10-CM | POA: Diagnosis not present

## 2017-11-13 DIAGNOSIS — Z87891 Personal history of nicotine dependence: Secondary | ICD-10-CM | POA: Diagnosis not present

## 2017-11-13 DIAGNOSIS — E785 Hyperlipidemia, unspecified: Secondary | ICD-10-CM | POA: Diagnosis not present

## 2017-11-13 NOTE — Progress Notes (Signed)
I have reviewed a Home Exercise Prescription with [name] . Reco is currently exercising at home. The patient is currently using using Peloton Bike 3x a week. Pt will begin going back to gym near home.  Saralyn Pilar and I discussed how to progress their exercise prescription. The patient stated that they understand the exercise prescription. We reviewed exercise guidelines, target heart rate during exercise, RPE Scale, weather conditions, NTG use, endpoints for exercise, warmup and cool down. Patient is encouraged to come to me with any questions. I will continue to follow up with the patient to assist them with progression and safety.    Carma Lair MS, ACSM CEP 11/13/2017 10:08 AM

## 2017-11-14 ENCOUNTER — Ambulatory Visit (HOSPITAL_COMMUNITY): Payer: Self-pay

## 2017-11-15 ENCOUNTER — Encounter (HOSPITAL_COMMUNITY)
Admission: RE | Admit: 2017-11-15 | Discharge: 2017-11-15 | Disposition: A | Discharge status: Home / Self Care | Payer: BLUE CROSS/BLUE SHIELD | Source: Ambulatory Visit | Attending: Interventional Cardiology | Admitting: Interventional Cardiology

## 2017-11-15 ENCOUNTER — Encounter: Payer: Self-pay | Admitting: Interventional Cardiology

## 2017-11-15 ENCOUNTER — Telehealth: Payer: Self-pay

## 2017-11-15 ENCOUNTER — Encounter: Payer: Self-pay | Admitting: Physician Assistant

## 2017-11-15 DIAGNOSIS — Z955 Presence of coronary angioplasty implant and graft: Secondary | ICD-10-CM | POA: Diagnosis not present

## 2017-11-15 DIAGNOSIS — E785 Hyperlipidemia, unspecified: Secondary | ICD-10-CM | POA: Diagnosis not present

## 2017-11-15 DIAGNOSIS — Z87891 Personal history of nicotine dependence: Secondary | ICD-10-CM | POA: Diagnosis not present

## 2017-11-15 DIAGNOSIS — I214 Non-ST elevation (NSTEMI) myocardial infarction: Secondary | ICD-10-CM

## 2017-11-15 DIAGNOSIS — K219 Gastro-esophageal reflux disease without esophagitis: Secondary | ICD-10-CM | POA: Diagnosis not present

## 2017-11-15 NOTE — Telephone Encounter (Signed)
This encounter was created in error - please disregard.

## 2017-11-15 NOTE — Telephone Encounter (Signed)
See telephone encounter 7/26

## 2017-11-15 NOTE — Telephone Encounter (Signed)
[place],    CP sounds benign. WOuld try to see if sx last just seconds, or minutes when they come on. If they are gone before he can get NTG, that is aslo reassuring. I suspect he will be ok to go on his trip but perhaps he can give more details. No realtion to exericse is also reassuring.     JV  ----- Message -----  From: Michaelyn Barter, RN  Sent: 11/15/2017  4:37 PM  To: Jettie Booze, MD  Subject: Melton Alar: Visit Follow-Up Question                 ----- Message -----  From: Lona Kettle Loppnow  Sent: 11/15/2017  4:34 PM  To: [address] Triage  Subject: Visit Follow-Up Question               ----- Message from [place], Generic sent at 11/15/2017 4:34 PM EDT -----    Dear Ms Bonnell Public. I have also sent a message about this to Dr Irish Lack. I want to stress that this minor, and probably more just to provide reassurance. I have been having very minor intermittent pain, probably about a level 1-2. It does not last long, and is not in the same spot. It is not a sharp pain, and is not a kind of "pressure", but does occur in different parts of my chest. I have been going to cardiac rehab, and I have a Peloton stationary bike at home that I use three times a week. I do not have pain when I am exercising, and I do not have any unexpected shortness of breath. When exercising, I get my heart rate up to about 140.     I am wondering if these random pains are anything to worry about. I have not needed to take Nitro Glycerin.     The primary reason I am asking is because we are scheduled to go to [place] in a little over 2 weeks. If it were not for the trip, I probably would not even bring this up.     Thanks. I can be reached at [phone]

## 2017-11-15 NOTE — Telephone Encounter (Signed)
-----   Message from [place], Generic sent at 11/15/2017 12:49 PM EDT -----    Hi Dr Irish Lack. I have question concerning some minor intermittent pain. I want to stress that it is minor, maybe a 1 or 2. It comes and goes, does not last long, and it is not localized, but it does occur in my chest. I have not had to take any Nitro Glycerin. The pain is not sharp, not a pressure, but just sort of a minor dull discomfort.     As you know I have been attending Cardiac Rehab at the hospital and I have also been exercising at home on a Peloton stationary bike. I have been getting my heart rate up to 135 - 140, with no pain, and no unexpected shortness of breath. My blood pressure is also typically in the range of 120/80.     The main reason I am asking this is because we are scheduled to go to [place] in a little over two weeks and I want to make sure that we are clear in your opinion.    Thank you. I can be reached at [phone]

## 2017-11-15 NOTE — Telephone Encounter (Signed)
Called and spoke to the patient. Patient states that his intermittent chest discomfort tends to happen late in the afternoon. He states that it happens at rest and that it occurs in different places on his chest each time (left, right, and middle). He rates the discomfort 1-2/10. He denies NTG use and states that he did not think the pain was severe enough to take it. He states that the discomfort only lasted for a few seconds or maybe a minute at most. Patient states that it never happens during exercise. He denies any additional Sx ie. SOB, dizziness, N/V, palpitations, etc. Made patient aware that his chest pain sounded benign. Made patient aware that he should be okay to go on his trip. Patient has current Rx for NTG if needed. Patient instructed to let us know if his Sx change or worsen. Patient verbalized understanding and thanked me for the call.

## 2017-11-16 NOTE — Telephone Encounter (Signed)
I agree

## 2017-11-18 ENCOUNTER — Encounter (HOSPITAL_COMMUNITY)
Admission: RE | Admit: 2017-11-18 | Discharge: 2017-11-18 | Disposition: A | Discharge status: Home / Self Care | Payer: BLUE CROSS/BLUE SHIELD | Source: Ambulatory Visit | Attending: Interventional Cardiology | Admitting: Interventional Cardiology

## 2017-11-18 DIAGNOSIS — Z955 Presence of coronary angioplasty implant and graft: Secondary | ICD-10-CM

## 2017-11-18 DIAGNOSIS — K219 Gastro-esophageal reflux disease without esophagitis: Secondary | ICD-10-CM | POA: Diagnosis not present

## 2017-11-18 DIAGNOSIS — I214 Non-ST elevation (NSTEMI) myocardial infarction: Secondary | ICD-10-CM | POA: Diagnosis not present

## 2017-11-18 DIAGNOSIS — E785 Hyperlipidemia, unspecified: Secondary | ICD-10-CM | POA: Diagnosis not present

## 2017-11-18 DIAGNOSIS — Z87891 Personal history of nicotine dependence: Secondary | ICD-10-CM | POA: Diagnosis not present

## 2017-11-20 ENCOUNTER — Encounter (HOSPITAL_COMMUNITY): Payer: BLUE CROSS/BLUE SHIELD

## 2017-11-20 ENCOUNTER — Encounter (HOSPITAL_COMMUNITY)
Admission: RE | Admit: 2017-11-20 | Discharge: 2017-11-20 | Disposition: A | Discharge status: Home / Self Care | Payer: BLUE CROSS/BLUE SHIELD | Source: Ambulatory Visit | Attending: Interventional Cardiology | Admitting: Interventional Cardiology

## 2017-11-20 DIAGNOSIS — Z955 Presence of coronary angioplasty implant and graft: Secondary | ICD-10-CM

## 2017-11-20 DIAGNOSIS — E785 Hyperlipidemia, unspecified: Secondary | ICD-10-CM | POA: Diagnosis not present

## 2017-11-20 DIAGNOSIS — K219 Gastro-esophageal reflux disease without esophagitis: Secondary | ICD-10-CM | POA: Diagnosis not present

## 2017-11-20 DIAGNOSIS — Z87891 Personal history of nicotine dependence: Secondary | ICD-10-CM | POA: Diagnosis not present

## 2017-11-20 DIAGNOSIS — I214 Non-ST elevation (NSTEMI) myocardial infarction: Secondary | ICD-10-CM

## 2017-11-22 ENCOUNTER — Encounter (HOSPITAL_COMMUNITY): Payer: BLUE CROSS/BLUE SHIELD

## 2017-11-22 ENCOUNTER — Encounter (HOSPITAL_COMMUNITY)
Admission: RE | Admit: 2017-11-22 | Discharge: 2017-11-22 | Disposition: A | Discharge status: Home / Self Care | Payer: BLUE CROSS/BLUE SHIELD | Source: Ambulatory Visit | Attending: Interventional Cardiology | Admitting: Interventional Cardiology

## 2017-11-22 DIAGNOSIS — I214 Non-ST elevation (NSTEMI) myocardial infarction: Secondary | ICD-10-CM | POA: Diagnosis not present

## 2017-11-22 DIAGNOSIS — E785 Hyperlipidemia, unspecified: Secondary | ICD-10-CM | POA: Diagnosis not present

## 2017-11-22 DIAGNOSIS — Z87891 Personal history of nicotine dependence: Secondary | ICD-10-CM | POA: Diagnosis not present

## 2017-11-22 DIAGNOSIS — Z955 Presence of coronary angioplasty implant and graft: Secondary | ICD-10-CM | POA: Diagnosis not present

## 2017-11-22 DIAGNOSIS — K219 Gastro-esophageal reflux disease without esophagitis: Secondary | ICD-10-CM | POA: Diagnosis not present

## 2017-11-25 ENCOUNTER — Encounter (HOSPITAL_COMMUNITY)
Admission: RE | Admit: 2017-11-25 | Discharge: 2017-11-25 | Disposition: A | Discharge status: Home / Self Care | Payer: BLUE CROSS/BLUE SHIELD | Source: Ambulatory Visit | Attending: Interventional Cardiology | Admitting: Interventional Cardiology

## 2017-11-25 ENCOUNTER — Encounter (HOSPITAL_COMMUNITY): Payer: BLUE CROSS/BLUE SHIELD

## 2017-11-25 DIAGNOSIS — K219 Gastro-esophageal reflux disease without esophagitis: Secondary | ICD-10-CM | POA: Diagnosis not present

## 2017-11-25 DIAGNOSIS — E785 Hyperlipidemia, unspecified: Secondary | ICD-10-CM | POA: Diagnosis not present

## 2017-11-25 DIAGNOSIS — Z87891 Personal history of nicotine dependence: Secondary | ICD-10-CM | POA: Diagnosis not present

## 2017-11-25 DIAGNOSIS — I214 Non-ST elevation (NSTEMI) myocardial infarction: Secondary | ICD-10-CM | POA: Diagnosis not present

## 2017-11-25 DIAGNOSIS — Z955 Presence of coronary angioplasty implant and graft: Secondary | ICD-10-CM | POA: Diagnosis not present

## 2017-11-26 ENCOUNTER — Ambulatory Visit (INDEPENDENT_AMBULATORY_CARE_PROVIDER_SITE_OTHER): Payer: BLUE CROSS/BLUE SHIELD | Admitting: Family Medicine

## 2017-11-26 VITALS — BP 113/68 | HR 45 | Temp 98.2°F | Ht 71.0 in | Wt 206.0 lb

## 2017-11-26 DIAGNOSIS — R739 Hyperglycemia, unspecified: Secondary | ICD-10-CM

## 2017-11-26 DIAGNOSIS — E559 Vitamin D deficiency, unspecified: Secondary | ICD-10-CM | POA: Diagnosis not present

## 2017-11-26 DIAGNOSIS — E669 Obesity, unspecified: Secondary | ICD-10-CM

## 2017-11-26 DIAGNOSIS — Z9189 Other specified personal risk factors, not elsewhere classified: Secondary | ICD-10-CM

## 2017-11-26 DIAGNOSIS — Z6830 Body mass index (BMI) 30.0-30.9, adult: Secondary | ICD-10-CM

## 2017-11-26 MED ORDER — VITAMIN D (ERGOCALCIFEROL) 1.25 MG (50000 UNIT) PO CAPS: 50000.0000 [IU] | ORAL_CAPSULE | ORAL | 0 refills | Status: DC

## 2017-11-27 ENCOUNTER — Encounter (HOSPITAL_COMMUNITY): Payer: Self-pay

## 2017-11-27 ENCOUNTER — Encounter (HOSPITAL_COMMUNITY)
Admission: RE | Admit: 2017-11-27 | Discharge: 2017-11-27 | Disposition: A | Discharge status: Home / Self Care | Payer: BLUE CROSS/BLUE SHIELD | Source: Ambulatory Visit | Attending: Interventional Cardiology | Admitting: Interventional Cardiology

## 2017-11-27 ENCOUNTER — Encounter (HOSPITAL_COMMUNITY): Payer: BLUE CROSS/BLUE SHIELD

## 2017-11-27 DIAGNOSIS — E785 Hyperlipidemia, unspecified: Secondary | ICD-10-CM | POA: Diagnosis not present

## 2017-11-27 DIAGNOSIS — Z955 Presence of coronary angioplasty implant and graft: Secondary | ICD-10-CM

## 2017-11-27 DIAGNOSIS — K219 Gastro-esophageal reflux disease without esophagitis: Secondary | ICD-10-CM | POA: Diagnosis not present

## 2017-11-27 DIAGNOSIS — I214 Non-ST elevation (NSTEMI) myocardial infarction: Secondary | ICD-10-CM

## 2017-11-27 DIAGNOSIS — Z87891 Personal history of nicotine dependence: Secondary | ICD-10-CM | POA: Diagnosis not present

## 2017-11-27 LAB — COMPREHENSIVE METABOLIC PANEL
A/G RATIO: 2.3 — AB (ref 1.2–2.2)
ALT: 29 IU/L (ref 0–44)
AST: 18 IU/L (ref 0–40)
Albumin: 4.5 g/dL (ref 3.6–4.8)
Alkaline Phosphatase: 60 IU/L (ref 39–117)
BILIRUBIN TOTAL: 0.3 mg/dL (ref 0.0–1.2)
BUN/Creatinine Ratio: 20 (ref 10–24)
BUN: 24 mg/dL (ref 8–27)
CALCIUM: 9.4 mg/dL (ref 8.6–10.2)
CHLORIDE: 107 mmol/L — AB (ref 96–106)
CO2: 22 mmol/L (ref 20–29)
Creatinine, Ser: 1.21 mg/dL (ref 0.76–1.27)
GFR calc Af Amer: 74 mL/min/{1.73_m2} (ref >59)
GFR calc non Af Amer: 64 mL/min/{1.73_m2} (ref >59)
GLUCOSE: 96 mg/dL (ref 65–99)
Globulin, Total: 2 g/dL (ref 1.5–4.5)
POTASSIUM: 4.7 mmol/L (ref 3.5–5.2)
Sodium: 143 mmol/L (ref 134–144)
Total Protein: 6.5 g/dL (ref 6.0–8.5)

## 2017-11-27 LAB — VITAMIN D 25 HYDROXY (VIT D DEFICIENCY, FRACTURES): Vit D, 25-Hydroxy: 32.5 ng/mL (ref 30.0–100.0)

## 2017-11-27 LAB — HEMOGLOBIN A1C
Est. average glucose Bld gHb Est-mCnc: 111 mg/dL
Hgb A1c MFr Bld: 5.5 % (ref 4.8–5.6)

## 2017-11-27 LAB — INSULIN, RANDOM: INSULIN: 8.7 u[IU]/mL (ref 2.6–24.9)

## 2017-11-27 NOTE — Progress Notes (Signed)
Office: [phone]  /  Fax: [phone]   HPI:   Chief Complaint: OBESITY Shane Wood is here to discuss his progress with his obesity treatment plan. He is on the keep a food journal with 1500-1600 calories and 100 grams of protein daily and is following his eating plan approximately 80 % of the time. He states he is doing cardiac rehab [place] bike for 30-45 minutes 5 times per week. Shane Wood is retaining some fluid today, has mostly maintained weight. He is going to [place] soon and would like to discuss travel strategies.  His weight is 206 lb (93.4 kg) today and has gained 2 pounds since his last visit. He has lost 10 lbs since starting treatment with [name].  Vitamin D Deficiency Shane Wood has a diagnosis of vitamin D deficiency. He is on prescription Vit D, due for labs. He denies nausea, vomiting or muscle weakness.  Hyperglycemia Shane Wood has had some elevated glucose levels, may be non-fasting but no recent A1c. He admits to polyphagia.  At risk for cardiovascular disease Shane Wood is at a higher than average risk for cardiovascular disease due to obesity. He currently denies any chest pain.  ALLERGIES: No Known Allergies  MEDICATIONS: Current Outpatient Medications on File Prior to Visit  Medication Sig Dispense Refill  . Ascorbic Acid (VITAMIN C) 1000 MG tablet Take 1,000 mg by mouth daily.    [name] aspirin EC 81 MG EC tablet Take 1 tablet (81 mg total) by mouth daily. 30 tablet 11  . atorvastatin (LIPITOR) 80 MG tablet Take 1 tablet (80 mg total) by mouth daily at 6 PM. 30 tablet 11  . Cyanocobalamin (VITAMIN B 12 PO) Take 1 tablet by mouth daily.    [name] ketorolac (ACULAR) 0.5 % ophthalmic solution Place 0.5 drops into both eyes as needed.    . metoprolol tartrate (LOPRESSOR) 25 MG tablet Take 0.5 tablets (12.5 mg total) by mouth 2 (two) times daily. 60 tablet 11  . Multiple Vitamin (MULTIVITAMIN) tablet Take 1 tablet by mouth daily.      . nitroGLYCERIN (NITROSTAT) 0.4 MG SL tablet  Place 1 tablet (0.4 mg total) under the tongue every 5 (five) minutes x 3 doses as needed for chest pain. 25 tablet 11  . omeprazole (PRILOSEC) 20 MG capsule Take 20 mg by mouth daily.    . ticagrelor (BRILINTA) 90 MG TABS tablet Take 1 tablet (90 mg total) by mouth 2 (two) times daily. 60 tablet 0   No current facility-administered medications on file prior to visit.     PAST MEDICAL HISTORY: Past Medical History:  Diagnosis Date  . Allergy   . BACK PAIN   . ELEVATED BP READING WITHOUT DX HYPERTENSION 05/03/2008   Qualifier: Diagnosis of  By: Larose Kells MD, [place] GERD (gastroesophageal reflux disease)   . H/O cardiovascular stress test 2005    (-)  . Hyperlipidemia   . INSOMNIA-SLEEP DISORDER-UNSPEC   . PVC (premature ventricular contraction)     PAST SURGICAL HISTORY: Past Surgical History:  Procedure Laterality Date  . ARTERY REPAIR Right 08/23/2017   Procedure: EXPLORATION RIGHT RADIAL ARTERY,  RIGHT FOREARM FASCIOTOMY;  Surgeon: Elam Dutch, MD;  Location: [facility] OR;  Service: Vascular;  Laterality: Right;  . CHOLECYSTECTOMY    . CORONARY STENT INTERVENTION N/A 08/23/2017   Procedure: CORONARY STENT INTERVENTION;  Surgeon: Jettie Booze, MD;  Location: [facility];  Service: Cardiovascular;  Laterality: N/A;  . LEFT HEART CATH AND CORONARY ANGIOGRAPHY N/A 08/23/2017  Procedure: LEFT HEART CATH AND CORONARY ANGIOGRAPHY;  Surgeon: Jettie Booze, MD;  Location: [facility];  Service: Cardiovascular;  Laterality: N/A;  . POLYPECTOMY    . TONSILLECTOMY     age 62 y/o    SOCIAL HISTORY: Social History   Tobacco Use  . Smoking status: Former Smoker    Last attempt to quit: 09/24/1988    Years since quitting: 29.1  . Smokeless tobacco: Former Systems developer    Types: Chew    Quit date: 1970  Substance Use Topics  . Alcohol use: Yes    Alcohol/week: 2.4 oz    Types: 4 Glasses of wine per week    Comment: occasionally on weekends  . Drug use: No    FAMILY  HISTORY: Family History  Problem Relation Age of Onset  . Lung cancer Father        smoker  . Diabetes Maternal Grandmother   . Diabetes Maternal Grandfather   . Hypertension Mother   . Depression Mother   . Obesity Mother   . Heart attack Other        uncle MI at age 57s  . Stroke Other        GM, in her 14  . Colon cancer Neg Hx   . Prostate cancer Neg Hx   . Colon polyps Neg Hx   . Esophageal cancer Neg Hx   . Rectal cancer Neg Hx   . Stomach cancer Neg Hx   . Pancreatic cancer Neg Hx     ROS: Review of Systems  Constitutional: Negative for weight loss.  Cardiovascular: Negative for chest pain.  Gastrointestinal: Negative for nausea and vomiting.  Musculoskeletal:       Negative muscle weakness  Endo/Heme/Allergies:       Positive polyphagia    PHYSICAL EXAM: Blood pressure 113/68, pulse (!) 45, temperature 98.2 F (36.8 C), temperature source Oral, height 5\' 11"  (1.803 m), weight 206 lb (93.4 kg), SpO2 97 %. Body mass index is 28.73 kg/m. Physical Exam  Constitutional: He is oriented to person, place, and time. He appears well-developed and well-nourished.  Cardiovascular: Normal rate.  Pulmonary/Chest: Effort normal.  Musculoskeletal: Normal range of motion.  Neurological: He is oriented to person, place, and time.  Skin: Skin is warm and dry.  Psychiatric: He has a normal mood and affect. His behavior is normal.  Vitals reviewed.   RECENT LABS AND TESTS: BMET    Component Value Date/Time   NA 143 11/26/2017 0827   K 4.7 11/26/2017 0827   CL 107 (H) 11/26/2017 0827   CO2 22 11/26/2017 0827   GLUCOSE 96 11/26/2017 0827   GLUCOSE 149 (H) 08/24/2017 0257   BUN 24 11/26/2017 0827   CREATININE 1.21 11/26/2017 0827   CALCIUM 9.4 11/26/2017 0827   GFRNONAA 64 11/26/2017 0827   GFRAA 74 11/26/2017 0827   Lab Results  Component Value Date   HGBA1C 5.5 11/26/2017   HGBA1C 5.7 (H) 08/08/2017   Lab Results  Component Value Date   INSULIN 8.7  11/26/2017   INSULIN 11.8 08/08/2017   CBC    Component Value Date/Time   WBC 12.1 (H) 08/24/2017 0257   RBC 3.93 (L) 08/24/2017 0257   HGB 11.0 (L) 08/24/2017 0257   HCT 33.6 (L) 08/24/2017 0257   PLT 204 08/24/2017 0257   MCV 85.5 08/24/2017 0257   MCH 28.0 08/24/2017 0257   MCHC 32.7 08/24/2017 0257   RDW 14.8 08/24/2017 0257   LYMPHSABS 1.7  07/15/2017 0828   MONOABS 0.4 07/15/2017 0828   EOSABS 0.2 07/15/2017 0828   BASOSABS 0.0 07/15/2017 0828   Iron/TIBC/Ferritin/ %Sat No results found for: IRON, TIBC, FERRITIN, IRONPCTSAT Lipid Panel     Component Value Date/Time   CHOL 126 10/04/2017 0802   TRIG 143 10/04/2017 0802   HDL 33 (L) 10/04/2017 0802   CHOLHDL 3.8 10/04/2017 0802   CHOLHDL 4 07/15/2017 0828   VLDL 41.0 (H) 07/15/2017 0828   LDLCALC 64 10/04/2017 0802   LDLDIRECT 105.0 07/15/2017 0828   Hepatic Function Panel     Component Value Date/Time   PROT 6.5 11/26/2017 0827   ALBUMIN 4.5 11/26/2017 0827   AST 18 11/26/2017 0827   ALT 29 11/26/2017 0827   ALKPHOS 60 11/26/2017 0827   BILITOT 0.3 11/26/2017 0827   BILIDIR 0.11 10/04/2017 0802      Component Value Date/Time   TSH 2.080 08/08/2017 1016   TSH 2.14 07/15/2017 0828   TSH 1.62 07/08/2015 1405  Results for Scholle, Javaun A "PAT" (MRN [id number]) as of 11/27/2017 09:34  Ref. Range 11/26/2017 08:27  Vitamin D, 25-Hydroxy Latest Ref Range: 30.0 - 100.0 ng/mL 32.5    ASSESSMENT AND PLAN: Vitamin D deficiency - Plan: VITAMIN D 25 Hydroxy (Vit-D Deficiency, Fractures), Vitamin D, Ergocalciferol, (DRISDOL) 50000 units CAPS capsule  Hyperglycemia - Plan: Comprehensive metabolic panel, Hemoglobin A1c, Insulin, random  At risk for heart disease  Class 1 obesity with serious comorbidity and body mass index (BMI) of 30.0 to 30.9 in adult, unspecified obesity type - Starting BMI greater then 30  PLAN:  Vitamin D Deficiency Sabatino was informed that low vitamin D levels contributes to fatigue and are  associated with obesity, breast, and colon cancer. Denarius agrees to continue taking prescription Vit D @50 ,000 IU every week #4 and we will refill for 1 month. He will follow up for routine testing of vitamin D, at least 2-3 times per year. He was informed of the risk of over-replacement of vitamin D and agrees to not increase his dose unless he discusses this with [name] first. We will check labs and Jamee agrees to follow up with our clinic in 4 weeks.  Hyperglycemia Fasting labs will be obtained and results with be discussed with Saralyn Pilar in 2 weeks at his follow up visit. In the meanwhile Casy was started on a lower simple carbohydrate diet and will work on weight loss efforts.  Cardiovascular risk counselling Temple was given extended (15 minutes) coronary artery disease prevention counseling today. He is 62 y.o. male and has risk factors for heart disease including obesity. We discussed intensive lifestyle modifications today with an emphasis on specific weight loss instructions and strategies. Pt was also informed of the importance of increasing exercise and decreasing saturated fats to help prevent heart disease.  Obesity Jeson is currently in the action stage of change. As such, his goal is to maintain weight for now He has agreed to portion control better and make smarter food choices, such as increase vegetables and decrease simple carbohydrates  Elmond has been instructed to work up to a goal of 150 minutes of combined cardio and strengthening exercise per week for weight loss and overall health benefits. We discussed the following Behavioral Modification Strategies today: increasing lean protein intake, increasing vegetables, increase H20 intake, no skipping meals, travel eating strategies, and celebration eating strategies Plan to increase exercise and maintain weight while on vacation.  Benno has agreed to follow up with our clinic in 4  weeks. He was informed of the importance  of frequent follow up visits to maximize his success with intensive lifestyle modifications for his multiple health conditions.   OBESITY BEHAVIORAL INTERVENTION VISIT  Today's visit was # 6 out of 22.  Starting weight: 216 lbs Starting date: 08/08/17 Today's weight : 206 lbs  Today's date: 11/26/2017 Total lbs lost to date: 10    ASK: We discussed the diagnosis of obesity with [name] today and Blase agreed to give [name] permission to discuss obesity behavioral modification therapy today.  ASSESS: Azaria has the diagnosis of obesity and his BMI today is 28.74 Abbie is in the action stage of change   ADVISE: Eman was educated on the multiple health risks of obesity as well as the benefit of weight loss to improve his health. He was advised of the need for long term treatment and the importance of lifestyle modifications.  AGREE: Multiple dietary modification options and treatment options were discussed and  Vickey agreed to the above obesity treatment plan.  Wilhemena Durie, am acting as transcriptionist for Dennard Nip, MD

## 2017-11-28 NOTE — Progress Notes (Signed)
Cardiac Individual Treatment Plan  Patient Details  Name: Shane Wood MRN: [id number] Date of Birth: 1956-03-10 Referring Provider:     CARDIAC REHAB PHASE II ORIENTATION from 10/31/2017 in [place]  Referring Provider  Casandra Doffing MD      Initial Encounter Date:    CARDIAC REHAB PHASE II ORIENTATION from 10/31/2017 in [place]  Date  10/31/17      Visit Diagnosis: 08/23/17 NSTEMI (non-ST elevated myocardial infarction) ([place])  08/23/17 Stented coronary artery  Patient's Home Medications on Admission:  Current Outpatient Medications:  .  Ascorbic Acid (VITAMIN C) 1000 MG tablet, Take 1,000 mg by mouth daily., Disp: , Rfl:  .  aspirin EC 81 MG EC tablet, Take 1 tablet (81 mg total) by mouth daily., Disp: 30 tablet, Rfl: 11 .  atorvastatin (LIPITOR) 80 MG tablet, Take 1 tablet (80 mg total) by mouth daily at 6 PM., Disp: 30 tablet, Rfl: 11 .  Cyanocobalamin (VITAMIN B 12 PO), Take 1 tablet by mouth daily., Disp: , Rfl:  .  ketorolac (ACULAR) 0.5 % ophthalmic solution, Place 0.5 drops into both eyes as needed., Disp: , Rfl:  .  metoprolol tartrate (LOPRESSOR) 25 MG tablet, Take 0.5 tablets (12.5 mg total) by mouth 2 (two) times daily., Disp: 60 tablet, Rfl: 11 .  Multiple Vitamin (MULTIVITAMIN) tablet, Take 1 tablet by mouth daily.  , Disp: , Rfl:  .  nitroGLYCERIN (NITROSTAT) 0.4 MG SL tablet, Place 1 tablet (0.4 mg total) under the tongue every 5 (five) minutes x 3 doses as needed for chest pain., Disp: 25 tablet, Rfl: 11 .  omeprazole (PRILOSEC) 20 MG capsule, Take 20 mg by mouth daily., Disp: , Rfl:  .  ticagrelor (BRILINTA) 90 MG TABS tablet, Take 1 tablet (90 mg total) by mouth 2 (two) times daily., Disp: 60 tablet, Rfl: 0 .  Vitamin D, Ergocalciferol, (DRISDOL) 50000 units CAPS capsule, Take 1 capsule (50,000 Units total) by mouth every 7 (seven) days., Disp: 4 capsule, Rfl: 0  Past Medical History: Past  Medical History:  Diagnosis Date  . Allergy   . BACK PAIN   . ELEVATED BP READING WITHOUT DX HYPERTENSION 05/03/2008   Qualifier: Diagnosis of  By: Larose Kells MD, [place] GERD (gastroesophageal reflux disease)   . H/O cardiovascular stress test 2005    (-)  . Hyperlipidemia   . INSOMNIA-SLEEP DISORDER-UNSPEC   . PVC (premature ventricular contraction)     Tobacco Use: Social History   Tobacco Use  Smoking Status Former Smoker  . Last attempt to quit: 09/24/1988  . Years since quitting: 29.1  Smokeless Tobacco Former Systems developer  . Types: Chew  . Quit date: 1970    Labs: Recent Chemical engineer    Labs for ITP Cardiac and Pulmonary Rehab Latest Ref Rng & Units 07/10/2016 07/15/2017 08/08/2017 10/04/2017 11/26/2017   Cholestrol 100 - 199 mg/dL 209(H) 170 - 126 -   LDLCALC 0 - 99 mg/dL 124(H) - - 64 -   LDLDIRECT mg/dL - 105.0 - - -   HDL >39 mg/dL 46.60 39.80 - 33(L) -   Trlycerides 0 - 149 mg/dL 190.0(H) 205.0(H) - 143 -   Hemoglobin A1c 4.8 - 5.6 % - - 5.7(H) - 5.5      Capillary Blood Glucose: No results found for: GLUCAP   Exercise Target Goals:    Exercise Program Goal: Individual exercise prescription set using results from initial 6 min  walk test and THRR while considering  patient's activity barriers and safety.   Exercise Prescription Goal: Initial exercise prescription builds to 30-45 minutes a day of aerobic activity, 2-3 days per week.  Home exercise guidelines will be given to patient during program as part of exercise prescription that the participant will acknowledge.  Activity Barriers & Risk Stratification: Activity Barriers & Cardiac Risk Stratification - 10/31/17 0815      Activity Barriers & Cardiac Risk Stratification   Activity Barriers  Arthritis;Back Problems    Cardiac Risk Stratification  High       6 Minute Walk: 6 Minute Walk    Row Name 10/31/17 1052         6 Minute Walk   Phase  Initial     Distance  2049 feet     Walk Time  6  minutes     # of Rest Breaks  0     MPH  3.9     METS  4.6     RPE  11     VO2 Peak  16.2     Symptoms  No     Resting HR  57 bpm     Resting BP  118/70     Resting Oxygen Saturation   98 %     Exercise Oxygen Saturation  during 6 min walk  98 %     Max Ex. HR  97 bpm     Max Ex. BP  142/80     2 Minute Post BP  120/70        Oxygen Initial Assessment:   Oxygen Re-Evaluation:   Oxygen Discharge (Final Oxygen Re-Evaluation):   Initial Exercise Prescription: Initial Exercise Prescription - 10/31/17 1000      Date of Initial Exercise RX and Referring Provider   Date  10/31/17    Referring Provider  Casandra Doffing MD    Expected Discharge Date  02/02/18      Treadmill   MPH  3.3    Grade  1    Minutes  10    METs  3.98      NuStep   Level  4    SPM  80    Minutes  10    METs  3      Rower   Level  3    Watts  30    Minutes  10    METs  4.7      Prescription Details   Frequency (times per week)  3    Duration  Progress to 30 minutes of continuous aerobic without signs/symptoms of physical distress      Intensity   THRR 40-80% of Max Heartrate  63-126    Ratings of Perceived Exertion  11-13    Perceived Dyspnea  0-4      Progression   Progression  Continue to progress workloads to maintain intensity without signs/symptoms of physical distress.      Resistance Training   Training Prescription  Yes    Weight  5lbs    Reps  10-15       Perform Capillary Blood Glucose checks as needed.  Exercise Prescription Changes: Exercise Prescription Changes    Row Name 11/04/17 1400 11/13/17 1609 11/25/17 1042         Response to Exercise   Blood Pressure (Admit)  112/80  118/60  130/80     Blood Pressure (Exercise)  148/82  142/60  150/70  Blood Pressure (Exit)  110/72  106/70  106/62     Heart Rate (Admit)  62 bpm  70 bpm  60 bpm     Heart Rate (Exercise)  113 bpm  123 bpm  129 bpm     Heart Rate (Exit)  70 bpm  77 bpm  67 bpm     Rating of  Perceived Exertion (Exercise)  12  12  14      Perceived Dyspnea (Exercise)  0  0  0     Symptoms  None   None   None      Comments  Pt oriented to exercise equipment   Pt will begin HIIT on 11/15/2017  Pt participating in HIIT      Duration  Progress to 30 minutes of  aerobic without signs/symptoms of physical distress  Continue with 30 min of aerobic exercise without signs/symptoms of physical distress.  Progress to 45 minutes of aerobic exercise without signs/symptoms of physical distress     Intensity  THRR New  THRR unchanged  THRR unchanged       Progression   Progression  Continue to progress workloads to maintain intensity without signs/symptoms of physical distress.  Continue to progress workloads to maintain intensity without signs/symptoms of physical distress.  Continue to progress workloads to maintain intensity without signs/symptoms of physical distress.     Average METs  4.5  5  5.7       Resistance Training   Training Prescription  Yes  No  Yes     Weight  5lbs  -  9lbs     Reps  10-15  -  10-15     Time  10 Minutes  -  10 Minutes       Interval Training   Interval Training  No  No  No       Treadmill   MPH  3.3  3.8  3.8     Grade  1  2  2  HIIT for 2 mins, 3.8/10     Minutes  10  10  10      METs  3.98  4.65  5.8       NuStep   Level  4  5  6  HIIT 2 mins, level 8     SPM  85  95  115     Minutes  10  10  10      METs  3.3  5.4  5.1       Rower   Level  3  4  5      Watts  57  60  70     Minutes  10  10  10      METs  5.2  5.4  6.2       Home Exercise Plan   Plans to continue exercise at  -  [employer] (comment) [facility] (comment) [employer]     Frequency  -  Add 2 additional days to program exercise sessions.  Add 2 additional days to program exercise sessions.     Initial Home Exercises Provided  -  11/13/17  11/13/17        Exercise Comments: Exercise Comments    Row Name 11/04/17 1439 11/13/17 1008 11/27/17 1041        Exercise Comments  Pt's first day of rehab. Pt oriented to exercise equipment. Pt responded well to exercise prescripiton. Will continue to monitor.  Reviewed Home Exercise with pt. Pt  is currently very active at home. Pt is interested in HIIT training. Request has been sent to physician. Will continue to monitor pt.   Pt has started HIIT Training. Pt is responding well to the challenge of the high intensity. Pt has two more sessions of Cardiac Rehab.         Exercise Goals and Review: Exercise Goals    Row Name 10/31/17 0816             Exercise Goals   Increase Physical Activity  Yes       Intervention  Provide advice, education, support and counseling about physical activity/exercise needs.;Develop an individualized exercise prescription for aerobic and resistive training based on initial evaluation findings, risk stratification, comorbidities and participant's personal goals.       Expected Outcomes  Short Term: Attend rehab on a regular basis to increase amount of physical activity.;Long Term: Add in home exercise to make exercise part of routine and to increase amount of physical activity.;Long Term: Exercising regularly at least 3-5 days a week.       Increase Strength and Stamina  Yes       Intervention  Provide advice, education, support and counseling about physical activity/exercise needs.;Develop an individualized exercise prescription for aerobic and resistive training based on initial evaluation findings, risk stratification, comorbidities and participant's personal goals.       Expected Outcomes  Short Term: Increase workloads from initial exercise prescription for resistance, speed, and METs.;Short Term: Perform resistance training exercises routinely during rehab and add in resistance training at home;Long Term: Improve cardiorespiratory fitness, muscular endurance and strength as measured by increased METs and functional capacity (6MWT)       Able to understand and use rate of  perceived exertion (RPE) scale  Yes       Intervention  Provide education and explanation on how to use RPE scale       Expected Outcomes  Short Term: Able to use RPE daily in rehab to express subjective intensity level;Long Term:  Able to use RPE to guide intensity level when exercising independently       Knowledge and understanding of Target Heart Rate Range (THRR)  Yes       Intervention  Provide education and explanation of THRR including how the numbers were predicted and where they are located for reference       Expected Outcomes  Short Term: Able to state/look up THRR;Long Term: Able to use THRR to govern intensity when exercising independently;Short Term: Able to use daily as guideline for intensity in rehab       Able to check pulse independently  Yes       Intervention  Provide education and demonstration on how to check pulse in carotid and radial arteries.;Review the importance of being able to check your own pulse for safety during independent exercise       Expected Outcomes  Short Term: Able to explain why pulse checking is important during independent exercise;Long Term: Able to check pulse independently and accurately       Understanding of Exercise Prescription  Yes       Intervention  Provide education, explanation, and written materials on patient's individual exercise prescription       Expected Outcomes  Short Term: Able to explain program exercise prescription;Long Term: Able to explain home exercise prescription to exercise independently          Exercise Goals Re-Evaluation : Exercise Goals Re-Evaluation    Row Name  11/13/17 1009             Exercise Goal Re-Evaluation   Exercise Goals Review  Increase Physical Activity;Increase Strength and Stamina;Able to understand and use rate of perceived exertion (RPE) scale;Knowledge and understanding of Target Heart Rate Range (THRR);Able to check pulse independently;Understanding of Exercise Prescription       Comments   Reviewed HEP With pt. Also reviewed THRR, RPE Scale, endpoints of exercise, NTG use, weather precautions, warmup and cool down.        Expected Outcomes  Pt is currently exercising at home 3 days at home, 30 minutes. Pt will only complete 3 more weeks of rehab. Request to do HIIT Training has been sent to physician for pt to start for the remaining time in rehab.             Discharge Exercise Prescription (Final Exercise Prescription Changes): Exercise Prescription Changes - 11/25/17 1042      Response to Exercise   Blood Pressure (Admit)  130/80    Blood Pressure (Exercise)  150/70    Blood Pressure (Exit)  106/62    Heart Rate (Admit)  60 bpm    Heart Rate (Exercise)  129 bpm    Heart Rate (Exit)  67 bpm    Rating of Perceived Exertion (Exercise)  14    Perceived Dyspnea (Exercise)  0    Symptoms  None     Comments  Pt participating in HIIT     Duration  Progress to 45 minutes of aerobic exercise without signs/symptoms of physical distress    Intensity  THRR unchanged      Progression   Progression  Continue to progress workloads to maintain intensity without signs/symptoms of physical distress.    Average METs  5.7      Resistance Training   Training Prescription  Yes    Weight  9lbs    Reps  10-15    Time  10 Minutes      Interval Training   Interval Training  No      Treadmill   MPH  3.8    Grade  2    Minutes  10    METs  5.8      NuStep   Level  6    SPM  115    Minutes  10    METs  5.1      Rower   Level  5    Watts  70    Minutes  10    METs  6.2      Home Exercise Plan   Plans to continue exercise at  [employer] (comment)    Frequency  Add 2 additional days to program exercise sessions.    Initial Home Exercises Provided  11/13/17       Nutrition:  Target Goals: Understanding of nutrition guidelines, daily intake of sodium 1500mg , cholesterol 200mg , calories 30% from fat and 7% or less from saturated fats, daily to have 5 or more  servings of fruits and vegetables.  Biometrics: Pre Biometrics - 10/31/17 1107      Pre Biometrics   Height  5' 10.25" (1.784 m)    Weight  94.1 kg    Waist Circumference  40 inches    Hip Circumference  41.5 inches    Waist to Hip Ratio  0.96 %    BMI (Calculated)  29.57    Triceps Skinfold  23 mm    % Body Fat  29.5 %  Grip Strength  43 kg    Flexibility  12 in    Single Leg Stand  30 seconds        Nutrition Therapy Plan and Nutrition Goals: Nutrition Therapy & Goals - 11/06/17 0805      Nutrition Therapy   Diet  consistent carbohydrate heart healthy      Personal Nutrition Goals   Nutrition Goal  Pt to identify food quantities necessary to achieve weight loss of 6-20 lbs. at graduation from cardiac rehab. Goal wt of 20 lb desired.     Personal Goal #2  Pt to try new mediterranean style recipes, explore a variety of food choices      Intervention Plan   Intervention  Prescribe, educate and counsel regarding individualized specific dietary modifications aiming towards targeted core components such as weight, hypertension, lipid management, diabetes, heart failure and other comorbidities.    Expected Outcomes  Short Term Goal: Understand basic principles of dietary content, such as calories, fat, sodium, cholesterol and nutrients.       Nutrition Assessments: Nutrition Assessments - 10/31/17 1205      MEDFICTS Scores   Pre Score  12       Nutrition Goals Re-Evaluation: Nutrition Goals Re-Evaluation    [place] Name 10/31/17 1205             Goals   Current Weight  203 lb 14.8 oz (92.5 kg)       Nutrition Goal  Pt to identify food quantities necessary to achieve weight loss of 6-24 lbs. at graduation from cardiac rehab. Goal wt of 20 lb desired.           Nutrition Goals Re-Evaluation: Nutrition Goals Re-Evaluation    [place] Name 10/31/17 1205             Goals   Current Weight  203 lb 14.8 oz (92.5 kg)       Nutrition Goal  Pt to identify food  quantities necessary to achieve weight loss of 6-24 lbs. at graduation from cardiac rehab. Goal wt of 20 lb desired.           Nutrition Goals Discharge (Final Nutrition Goals Re-Evaluation): Nutrition Goals Re-Evaluation - 10/31/17 1205      Goals   Current Weight  203 lb 14.8 oz (92.5 kg)    Nutrition Goal  Pt to identify food quantities necessary to achieve weight loss of 6-24 lbs. at graduation from cardiac rehab. Goal wt of 20 lb desired.        Psychosocial: Target Goals: Acknowledge presence or absence of significant depression and/or stress, maximize coping skills, provide positive support system. Participant is able to verbalize types and ability to use techniques and skills needed for reducing stress and depression.  Initial Review & Psychosocial Screening: Initial Psych Review & Screening - 10/31/17 0754      Initial Review   Current issues with  None Identified      Family Dynamics   Good Support System?  Yes      Barriers   Psychosocial barriers to participate in program  There are no identifiable barriers or psychosocial needs.      Screening Interventions   Interventions  Encouraged to exercise       Quality of Life Scores: Quality of Life - 10/31/17 0754      Quality of Life   Select  Quality of Life      Quality of Life Scores   Health/Function Pre  22.4 %  Socioeconomic Pre  25.75 %    Psych/Spiritual Pre  22.29 %    Family Pre  28.8 %    GLOBAL Pre  24.07 %      Scores of 19 and below usually indicate a poorer quality of life in these areas.  A difference of  2-3 points is a clinically meaningful difference.  A difference of 2-3 points in the total score of the Quality of Life Index has been associated with significant improvement in overall quality of life, self-image, physical symptoms, and general health in studies assessing change in quality of life.  PHQ-9: Recent Review Flowsheet Data    Depression screen [facility] 2/9 08/08/2017 03/06/2017  07/08/2015   Decreased Interest 1 0 0   Down, Depressed, Hopeless 1 0 0   PHQ - 2 Score 2 0 0   Altered sleeping 2 - -   Tired, decreased energy 2 - -   Change in appetite 1 - -   Feeling bad or failure about yourself  1 - -   Trouble concentrating 1 - -   Moving slowly or fidgety/restless 0 - -   Suicidal thoughts 0 - -   PHQ-9 Score 9 - -   Difficult doing work/chores Not difficult at all - -     Interpretation of Total Score  Total Score Depression Severity:  1-4 = Minimal depression, 5-9 = Mild depression, 10-14 = Moderate depression, 15-19 = Moderately severe depression, 20-27 = Severe depression   Psychosocial Evaluation and Intervention: Psychosocial Evaluation - 11/04/17 0742      Psychosocial Evaluation & Interventions   Interventions  Encouraged to exercise with the program and follow exercise prescription    Comments  No psychosocial needs identified. No intervention necessary.  [name] enjoys fishing and traveling.     Expected Outcomes  [name] will continue to exhibit a positive outlook with good coping skills.    Continue Psychosocial Services   No Follow up required       Psychosocial Re-Evaluation: Psychosocial Re-Evaluation    [place] Name 11/27/17 1119             Psychosocial Re-Evaluation   Current issues with  None Identified       Comments  No psychosocial needs identified.       Expected Outcomes  [name] will maintain a positive outlook with good coping skills.       Interventions  Encouraged to attend Cardiac Rehabilitation for the exercise       Continue Psychosocial Services   No Follow up required          Psychosocial Discharge (Final Psychosocial Re-Evaluation): Psychosocial Re-Evaluation - 11/27/17 1119      Psychosocial Re-Evaluation   Current issues with  None Identified    Comments  No psychosocial needs identified.    Expected Outcomes  [name] will maintain a positive outlook with good coping skills.    Interventions  Encouraged to attend Cardiac  Rehabilitation for the exercise    Continue Psychosocial Services   No Follow up required       Vocational Rehabilitation: Provide vocational rehab assistance to qualifying candidates.   Vocational Rehab Evaluation & Intervention: Vocational Rehab - 10/31/17 0754      Initial Vocational Rehab Evaluation & Intervention   Assessment shows need for Vocational Rehabilitation  No       Education: Education Goals: Education classes will be provided on a weekly basis, covering required topics. Participant will state understanding/return demonstration of topics presented.  Learning Barriers/Preferences: Learning Barriers/Preferences - 10/31/17 0109      Learning Barriers/Preferences   Learning Barriers  Sight    Learning Preferences  Skilled Demonstration       Education Topics: Count Your Pulse:  -Group instruction provided by verbal instruction, demonstration, patient participation and written materials to support subject.  Instructors address importance of being able to find your pulse and how to count your pulse when at home without a heart monitor.  Patients get hands on experience counting their pulse with staff help and individually.   Heart Attack, Angina, and Risk Factor Modification:  -Group instruction provided by verbal instruction, video, and written materials to support subject.  Instructors address signs and symptoms of angina and heart attacks.    Also discuss risk factors for heart disease and how to make changes to improve heart health risk factors.   Functional Fitness:  -Group instruction provided by verbal instruction, demonstration, patient participation, and written materials to support subject.  Instructors address safety measures for doing things around the house.  Discuss how to get up and down off the floor, how to pick things up properly, how to safely get out of a chair without assistance, and balance training.   Meditation and Mindfulness:  -Group  instruction provided by verbal instruction, patient participation, and written materials to support subject.  Instructor addresses importance of mindfulness and meditation practice to help reduce stress and improve awareness.  Instructor also leads participants through a meditation exercise.    Stretching for Flexibility and Mobility:  -Group instruction provided by verbal instruction, patient participation, and written materials to support subject.  Instructors lead participants through series of stretches that are designed to increase flexibility thus improving mobility.  These stretches are additional exercise for major muscle groups that are typically performed during regular warm up and cool down.   Hands Only CPR:  -Group verbal, video, and participation provides a basic overview of AHA guidelines for community CPR. Role-play of emergencies allow participants the opportunity to practice calling for help and chest compression technique with discussion of AED use.   Hypertension: -Group verbal and written instruction that provides a basic overview of hypertension including the most recent diagnostic guidelines, risk factor reduction with self-care instructions and medication management.    Nutrition I class: Heart Healthy Eating:  -Group instruction provided by PowerPoint slides, verbal discussion, and written materials to support subject matter. The instructor gives an explanation and review of the Therapeutic Lifestyle Changes diet recommendations, which includes a discussion on lipid goals, dietary fat, sodium, fiber, plant stanol/sterol esters, sugar, and the components of a well-balanced, healthy diet.   Nutrition II class: Lifestyle Skills:  -Group instruction provided by PowerPoint slides, verbal discussion, and written materials to support subject matter. The instructor gives an explanation and review of label reading, grocery shopping for heart health, heart healthy recipe  modifications, and ways to make healthier choices when eating out.   Diabetes Question & Answer:  -Group instruction provided by PowerPoint slides, verbal discussion, and written materials to support subject matter. The instructor gives an explanation and review of diabetes co-morbidities, pre- and post-prandial blood glucose goals, pre-exercise blood glucose goals, signs, symptoms, and treatment of hypoglycemia and hyperglycemia, and foot care basics.   Diabetes Blitz:  -Group instruction provided by PowerPoint slides, verbal discussion, and written materials to support subject matter. The instructor gives an explanation and review of the physiology behind type 1 and type 2 diabetes, diabetes medications and rational behind using different  medications, pre- and post-prandial blood glucose recommendations and Hemoglobin A1c goals, diabetes diet, and exercise including blood glucose guidelines for exercising safely.    Portion Distortion:  -Group instruction provided by PowerPoint slides, verbal discussion, written materials, and food models to support subject matter. The instructor gives an explanation of serving size versus portion size, changes in portions sizes over the last 20 years, and what consists of a serving from each food group.   Stress Management:  -Group instruction provided by verbal instruction, video, and written materials to support subject matter.  Instructors review role of stress in heart disease and how to cope with stress positively.     Exercising on Your Own:  -Group instruction provided by verbal instruction, power point, and written materials to support subject.  Instructors discuss benefits of exercise, components of exercise, frequency and intensity of exercise, and end points for exercise.  Also discuss use of nitroglycerin and activating EMS.  Review options of places to exercise outside of rehab.  Review guidelines for sex with heart disease.   Cardiac Drugs I:   -Group instruction provided by verbal instruction and written materials to support subject.  Instructor reviews cardiac drug classes: antiplatelets, anticoagulants, beta blockers, and statins.  Instructor discusses reasons, side effects, and lifestyle considerations for each drug class.   Cardiac Drugs II:  -Group instruction provided by verbal instruction and written materials to support subject.  Instructor reviews cardiac drug classes: angiotensin converting enzyme inhibitors (ACE-I), angiotensin II receptor blockers (ARBs), nitrates, and calcium channel blockers.  Instructor discusses reasons, side effects, and lifestyle considerations for each drug class.   Anatomy and Physiology of the Circulatory System:  Group verbal and written instruction and models provide basic cardiac anatomy and physiology, with the coronary electrical and arterial systems. Review of: AMI, Angina, Valve disease, Heart Failure, Peripheral Artery Disease, Cardiac Arrhythmia, Pacemakers, and the ICD.   Other Education:  -Group or individual verbal, written, or video instructions that support the educational goals of the cardiac rehab program.   Holiday Eating Survival Tips:  -Group instruction provided by PowerPoint slides, verbal discussion, and written materials to support subject matter. The instructor gives patients tips, tricks, and techniques to help them not only survive but enjoy the holidays despite the onslaught of food that accompanies the holidays.   Knowledge Questionnaire Score: Knowledge Questionnaire Score - 10/31/17 0753      Knowledge Questionnaire Score   Pre Score  23/24       Core Components/Risk Factors/Patient Goals at Admission: Personal Goals and Risk Factors at Admission - 10/31/17 1108      Core Components/Risk Factors/Patient Goals on Admission    Weight Management  Yes;Weight Maintenance;Weight Loss    Intervention  Weight Management: Develop a combined nutrition and exercise  program designed to reach desired caloric intake, while maintaining appropriate intake of nutrient and fiber, sodium and fats, and appropriate energy expenditure required for the weight goal.;Weight Management: Provide education and appropriate resources to help participant work on and attain dietary goals.;Weight Management/Obesity: Establish reasonable short term and long term weight goals.    Admit Weight  207 lb 7.3 oz (94.1 kg)    Goal Weight: Short Term  200 lb (90.7 kg)    Goal Weight: Long Term  190 lb (86.2 kg)    Expected Outcomes  Short Term: Continue to assess and modify interventions until short term weight is achieved;Long Term: Adherence to nutrition and physical activity/exercise program aimed toward attainment of established weight goal;Weight Maintenance: Understanding  of the daily nutrition guidelines, which includes 25-35% calories from fat, 7% or less cal from saturated fats, less than 200mg  cholesterol, less than 1.5gm of sodium, & 5 or more servings of fruits and vegetables daily;Weight Loss: Understanding of general recommendations for a balanced deficit meal plan, which promotes 1-2 lb weight loss per week and includes a negative energy balance of [id number] kcal/d;Understanding recommendations for meals to include 15-35% energy as protein, 25-35% energy from fat, 35-60% energy from carbohydrates, less than 200mg  of dietary cholesterol, 20-35 gm of total fiber daily;Understanding of distribution of calorie intake throughout the day with the consumption of 4-5 meals/snacks    Hypertension  Yes    Intervention  Provide education on lifestyle modifcations including regular physical activity/exercise, weight management, moderate sodium restriction and increased consumption of fresh fruit, vegetables, and low fat dairy, alcohol moderation, and smoking cessation.;Monitor prescription use compliance.    Expected Outcomes  Short Term: Continued assessment and intervention until BP is < 140/26mm  HG in hypertensive participants. < 130/32mm HG in hypertensive participants with diabetes, heart failure or chronic kidney disease.;Long Term: Maintenance of blood pressure at goal levels.    Lipids  Yes    Intervention  Provide education and support for participant on nutrition & aerobic/resistive exercise along with prescribed medications to achieve LDL 70mg , HDL >40mg .    Expected Outcomes  Short Term: Participant states understanding of desired cholesterol values and is compliant with medications prescribed. Participant is following exercise prescription and nutrition guidelines.;Long Term: Cholesterol controlled with medications as prescribed, with individualized exercise RX and with personalized nutrition plan. Value goals: LDL < 70mg , HDL > 40 mg.    Stress  Yes    Intervention  Offer individual and/or small group education and counseling on adjustment to heart disease, stress management and health-related lifestyle change. Teach and support self-help strategies.;Refer participants experiencing significant psychosocial distress to appropriate mental health specialists for further evaluation and treatment. When possible, include family members and significant others in education/counseling sessions.    Expected Outcomes  Short Term: Participant demonstrates changes in health-related behavior, relaxation and other stress management skills, ability to obtain effective social support, and compliance with psychotropic medications if prescribed.;Long Term: Emotional wellbeing is indicated by absence of clinically significant psychosocial distress or social isolation.    Personal Goal Other  Yes    Personal Goal  Avoid future occurrences and hospitalization. Live a heart healthy lifestyle    Intervention  Provide education and support regarding risk factor modifications, sign and symptoms of CVD's, medication management, stress management and exercise programming to encourage heart healthy living.     Expected Outcomes  Pt will make necessary changes to lifestyle to decrease future occurrences and modifiable risk factors.        Core Components/Risk Factors/Patient Goals Review:  Goals and Risk Factor Review    Row Name 11/04/17 0740 11/27/17 1118           Core Components/Risk Factors/Patient Goals Review   Personal Goals Review  Weight Management/Obesity;Stress;Hypertension;Lipids  Weight Management/Obesity;Stress;Hypertension;Lipids      Review  [name] has multiple CAD RF and is willing to particiapte in CR Program.  [name] would like to develop an exercise routine and learn activitiy limitations.  [name] has multiple CAD RF and is willing to particiapte in [place] has started HIIT and is tolerating it well.  He enjoys the routine of this.      Expected Outcomes  [name] will continue to participate in CR exercise, nutrition,  and lifestyle modification opportunities.   [name] will continue to participate in CR exercise, nutrition, and lifestyle modification opportunities.          Core Components/Risk Factors/Patient Goals at Discharge (Final Review):  Goals and Risk Factor Review - 11/27/17 1118      Core Components/Risk Factors/Patient Goals Review   Personal Goals Review  Weight Management/Obesity;Stress;Hypertension;Lipids    Review  [name] has multiple CAD RF and is willing to particiapte in CR Program. [name] has started HIIT and is tolerating it well.  He enjoys the routine of this.    Expected Outcomes  [name] will continue to participate in CR exercise, nutrition, and lifestyle modification opportunities.        ITP Comments: ITP Comments    Row Name 10/31/17 0750 11/04/17 0740 11/27/17 1117       ITP Comments  Dr.Traci Radford Pax, Medical Director   Pat started his first day of exercise. He tolerated it well.   30 Day ITP Review.  [name] is doing well with exercise.  He started HIIT recently and is enjoying that.  He is preparing for vacation soon and will graduate before vacation.          Comments: See ITP Comments.

## 2017-11-29 ENCOUNTER — Encounter (HOSPITAL_COMMUNITY): Payer: BLUE CROSS/BLUE SHIELD

## 2017-11-29 ENCOUNTER — Encounter (HOSPITAL_COMMUNITY)
Admission: RE | Admit: 2017-11-29 | Discharge: 2017-11-29 | Disposition: A | Discharge status: Home / Self Care | Payer: BLUE CROSS/BLUE SHIELD | Source: Ambulatory Visit | Attending: Interventional Cardiology | Admitting: Interventional Cardiology

## 2017-11-29 DIAGNOSIS — K219 Gastro-esophageal reflux disease without esophagitis: Secondary | ICD-10-CM | POA: Diagnosis not present

## 2017-11-29 DIAGNOSIS — Z87891 Personal history of nicotine dependence: Secondary | ICD-10-CM | POA: Diagnosis not present

## 2017-11-29 DIAGNOSIS — I214 Non-ST elevation (NSTEMI) myocardial infarction: Secondary | ICD-10-CM

## 2017-11-29 DIAGNOSIS — E785 Hyperlipidemia, unspecified: Secondary | ICD-10-CM | POA: Diagnosis not present

## 2017-11-29 DIAGNOSIS — Z955 Presence of coronary angioplasty implant and graft: Secondary | ICD-10-CM

## 2017-12-02 ENCOUNTER — Encounter (HOSPITAL_COMMUNITY): Payer: BLUE CROSS/BLUE SHIELD

## 2017-12-02 ENCOUNTER — Encounter (HOSPITAL_COMMUNITY)
Admission: RE | Admit: 2017-12-02 | Discharge: 2017-12-02 | Disposition: A | Discharge status: Home / Self Care | Payer: BLUE CROSS/BLUE SHIELD | Source: Ambulatory Visit | Attending: Interventional Cardiology | Admitting: Interventional Cardiology

## 2017-12-02 VITALS — Ht 70.25 in | Wt 209.0 lb

## 2017-12-02 DIAGNOSIS — I214 Non-ST elevation (NSTEMI) myocardial infarction: Secondary | ICD-10-CM | POA: Diagnosis not present

## 2017-12-02 DIAGNOSIS — Z955 Presence of coronary angioplasty implant and graft: Secondary | ICD-10-CM | POA: Diagnosis not present

## 2017-12-02 DIAGNOSIS — Z87891 Personal history of nicotine dependence: Secondary | ICD-10-CM | POA: Diagnosis not present

## 2017-12-02 DIAGNOSIS — E785 Hyperlipidemia, unspecified: Secondary | ICD-10-CM | POA: Diagnosis not present

## 2017-12-02 DIAGNOSIS — K219 Gastro-esophageal reflux disease without esophagitis: Secondary | ICD-10-CM | POA: Diagnosis not present

## 2017-12-04 ENCOUNTER — Encounter (HOSPITAL_COMMUNITY): Payer: BLUE CROSS/BLUE SHIELD

## 2017-12-06 ENCOUNTER — Encounter (HOSPITAL_COMMUNITY): Payer: BLUE CROSS/BLUE SHIELD

## 2017-12-09 ENCOUNTER — Encounter (HOSPITAL_COMMUNITY): Payer: BLUE CROSS/BLUE SHIELD

## 2017-12-11 ENCOUNTER — Encounter (HOSPITAL_COMMUNITY): Payer: BLUE CROSS/BLUE SHIELD

## 2017-12-12 NOTE — Progress Notes (Signed)
Discharge Progress Report  Patient Details  Name: Shane Wood MRN: [id number] Date of Birth: [birth year] Referring Provider:     [place] from 10/31/2017 in [place]  Referring Provider  Casandra Doffing MD       Number of Visits: 14  Reason for Discharge:  Patient reached a stable level of exercise. Patient independent in their exercise.  Smoking History:  Social History   Tobacco Use  Smoking Status Former Smoker  . Last attempt to quit: 09/24/1988  . Years since quitting: 29.2  Smokeless Tobacco Former Systems developer  . Types: Chew  . Quit date: 1970    Diagnosis:  08/23/17 Stented coronary artery  08/23/17 NSTEMI (non-ST elevated myocardial infarction) [facility])  ADL [facility]SD:   Initial Exercise Prescription: Initial Exercise Prescription - 10/31/17 1000      Date of Initial Exercise RX and Referring Provider   Date  10/31/17    Referring Provider  Casandra Doffing MD    Expected Discharge Date  02/02/18      Treadmill   MPH  3.3    Grade  1    Minutes  10    METs  3.98      NuStep   Level  4    SPM  80    Minutes  10    METs  3      Rower   Level  3    Watts  30    Minutes  10    METs  4.7      Prescription Details   Frequency (times per week)  3    Duration  Progress to 30 minutes of continuous aerobic without signs/symptoms of physical distress      Intensity   THRR 40-80% of Max Heartrate  63-126    Ratings of Perceived Exertion  11-13    Perceived Dyspnea  0-4      Progression   Progression  Continue to progress workloads to maintain intensity without signs/symptoms of physical distress.      Resistance Training   Training Prescription  Yes    Weight  5lbs    Reps  10-15       Discharge Exercise Prescription (Final Exercise Prescription Changes): Exercise Prescription Changes - 12/02/17 1400      Response to Exercise   Blood Pressure (Admit)  124/78    Blood Pressure (Exercise)  160/80     Blood Pressure (Exit)  106/62    Heart Rate (Admit)  66 bpm    Heart Rate (Exercise)  124 bpm    Heart Rate (Exit)  66 bpm    Rating of Perceived Exertion (Exercise)  14    Perceived Dyspnea (Exercise)  0    Symptoms  None     Comments  Pt graduated Cardiac Rehab     Duration  Continue with 45 min of aerobic exercise without signs/symptoms of physical distress.    Intensity  THRR New      Progression   Progression  Continue to progress workloads to maintain intensity without signs/symptoms of physical distress.    Average METs  6.5      Resistance Training   Training Prescription  Yes    Weight  9lbs    Reps  10-15    Time  10 Minutes      Interval Training   Interval Training  Yes      Treadmill   MPH  3.8  Grade  2   HIIT for 2 mins, Incline @ 10%   Minutes  10    METs  5.8      NuStep   Level  6   HIIT for 2 mins, Level 8    SPM  115    Minutes  10    METs  6.3      Rower   Level  5    Watts  75    Minutes  10    METs  7.5      Home Exercise Plan   Plans to continue exercise at  [employer] (comment)   [employer]    Frequency  Add 4 additional days to program exercise sessions.    Initial Home Exercises Provided  11/13/17       Functional Capacity: 6 Minute Walk    Row Name 10/31/17 1052 12/02/17 1511       6 Minute Walk   Phase  Initial  Discharge    Distance  2049 feet  2297 feet    Distance % Change  -  12.1 %    Distance Feet Change  -  248 ft    Walk Time  6 minutes  6 minutes    # of Rest Breaks  0  0    MPH  3.9  4.4    METS  4.6  5.17    RPE  11  11    Perceived Dyspnea   -  0    VO2 Peak  16.2  18.11    Symptoms  No  No    Resting HR  57 bpm  62 bpm    Resting BP  118/70  122/68    Resting Oxygen Saturation   98 %  -    Exercise Oxygen Saturation  during 6 min walk  98 %  -    Max Ex. HR  97 bpm  117 bpm    Max Ex. BP  142/80  134/70    2 Minute Post BP  120/70  118/64       Psychological, QOL, Others -  Outcomes: PHQ 2/9: Depression screen [facility] 2/9 12/12/2017 08/08/2017 03/06/2017 07/08/2015  Decreased Interest 0 1 0 0  Down, Depressed, Hopeless 0 1 0 0  PHQ - 2 Score 0 2 0 0  Altered sleeping - 2 - -  Tired, decreased energy - 2 - -  Change in appetite - 1 - -  Feeling bad or failure about yourself  - 1 - -  Trouble concentrating - 1 - -  Moving slowly or fidgety/restless - 0 - -  Suicidal thoughts - 0 - -  PHQ-9 Score - 9 - -  Difficult doing work/chores - Not difficult at all - -    Quality of Life: Quality of Life - 12/02/17 1517      Quality of Life   Select  Quality of Life      Quality of Life Scores   Health/Function Pre  22.4 %    Health/Function Post  21.67 %    Health/Function % Change  -3.26 %    Socioeconomic Pre  25.75 %    Socioeconomic Post  20.57 %    Socioeconomic % Change   -20.12 %    Psych/Spiritual Pre  22.29 %    Psych/Spiritual Post  21.57 %    Psych/Spiritual % Change  -3.23 %    Family Pre  28.8 %  Family Post  26.4 %    Family % Change  -8.33 %    GLOBAL Pre  24.07 %    GLOBAL Post  22.12 %    GLOBAL % Change  -8.1 %       Personal Goals: Goals established at orientation with interventions provided to work toward goal. Personal Goals and Risk Factors at Admission - 10/31/17 1108      Core Components/Risk Factors/Patient Goals on Admission    Weight Management  Yes;Weight Maintenance;Weight Loss    Intervention  Weight Management: Develop a combined nutrition and exercise program designed to reach desired caloric intake, while maintaining appropriate intake of nutrient and fiber, sodium and fats, and appropriate energy expenditure required for the weight goal.;Weight Management: Provide education and appropriate resources to help participant work on and attain dietary goals.;Weight Management/Obesity: Establish reasonable short term and long term weight goals.    Admit Weight  207 lb 7.3 oz (94.1 kg)    Goal Weight: Short Term  200 lb (90.7  kg)    Goal Weight: Long Term  190 lb (86.2 kg)    Expected Outcomes  Short Term: Continue to assess and modify interventions until short term weight is achieved;Long Term: Adherence to nutrition and physical activity/exercise program aimed toward attainment of established weight goal;Weight Maintenance: Understanding of the daily nutrition guidelines, which includes 25-35% calories from fat, 7% or less cal from saturated fats, less than 200mg  cholesterol, less than 1.5gm of sodium, & 5 or more servings of fruits and vegetables daily;Weight Loss: Understanding of general recommendations for a balanced deficit meal plan, which promotes 1-2 lb weight loss per week and includes a negative energy balance of [phone] kcal/d;Understanding recommendations for meals to include 15-35% energy as protein, 25-35% energy from fat, 35-60% energy from carbohydrates, less than 200mg  of dietary cholesterol, 20-35 gm of total fiber daily;Understanding of distribution of calorie intake throughout the day with the consumption of 4-5 meals/snacks    Hypertension  Yes    Intervention  Provide education on lifestyle modifcations including regular physical activity/exercise, weight management, moderate sodium restriction and increased consumption of fresh fruit, vegetables, and low fat dairy, alcohol moderation, and smoking cessation.;Monitor prescription use compliance.    Expected Outcomes  Short Term: Continued assessment and intervention until BP is < 140/48mm HG in hypertensive participants. < 130/33mm HG in hypertensive participants with diabetes, heart failure or chronic kidney disease.;Long Term: Maintenance of blood pressure at goal levels.    Lipids  Yes    Intervention  Provide education and support for participant on nutrition & aerobic/resistive exercise along with prescribed medications to achieve LDL 70mg , HDL >40mg .    Expected Outcomes  Short Term: Participant states understanding of desired cholesterol values and  is compliant with medications prescribed. Participant is following exercise prescription and nutrition guidelines.;Long Term: Cholesterol controlled with medications as prescribed, with individualized exercise RX and with personalized nutrition plan. Value goals: LDL < 70mg , HDL > 40 mg.    Stress  Yes    Intervention  Offer individual and/or small group education and counseling on adjustment to heart disease, stress management and health-related lifestyle change. Teach and support self-help strategies.;Refer participants experiencing significant psychosocial distress to appropriate mental health specialists for further evaluation and treatment. When possible, include family members and significant others in education/counseling sessions.    Expected Outcomes  Short Term: Participant demonstrates changes in health-related behavior, relaxation and other stress management skills, ability to obtain effective social support, and compliance with psychotropic  medications if prescribed.;Long Term: Emotional wellbeing is indicated by absence of clinically significant psychosocial distress or social isolation.    Personal Goal Other  Yes    Personal Goal  Avoid future occurrences and hospitalization. Live a heart healthy lifestyle    Intervention  Provide education and support regarding risk factor modifications, sign and symptoms of CVD's, medication management, stress management and exercise programming to encourage heart healthy living.    Expected Outcomes  Pt will make necessary changes to lifestyle to decrease future occurrences and modifiable risk factors.         Personal Goals Discharge: Goals and Risk Factor Review    Row Name 11/04/17 0740 11/27/17 1118 12/03/17 1546         Core Components/Risk Factors/Patient Goals Review   Personal Goals Review  Weight Management/Obesity;Stress;Hypertension;Lipids  Weight Management/Obesity;Stress;Hypertension;Lipids  Weight  Management/Obesity;Stress;Hypertension;Lipids     Review  [name] has multiple CAD RF and is willing to particiapte in CR Program.  [name] would like to develop an exercise routine and learn activitiy limitations.  [name] has multiple CAD RF and is willing to particiapte in [place] has started HIIT and is tolerating it well.  He enjoys the routine of this.  [name] has multiple CAD RF and is willing to particiapte in [place] has started HIIT and is tolerating it well.  He enjoys the routine of this. pt congratulated on his diligent exercise and encouraged to continue in the community. pt plans to continue exercising at [employer] and using home equipment.      Expected Outcomes  [name] will continue to participate in CR exercise, nutrition, and lifestyle modification opportunities.   [name] will continue to participate in CR exercise, nutrition, and lifestyle modification opportunities.   [name] will continue to participate in exercise, nutrition, and lifestyle modification opportunities.         Exercise Goals and Review: Exercise Goals    Row Name 10/31/17 0816             Exercise Goals   Increase Physical Activity  Yes       Intervention  Provide advice, education, support and counseling about physical activity/exercise needs.;Develop an individualized exercise prescription for aerobic and resistive training based on initial evaluation findings, risk stratification, comorbidities and participant's personal goals.       Expected Outcomes  Short Term: Attend rehab on a regular basis to increase amount of physical activity.;Long Term: Add in home exercise to make exercise part of routine and to increase amount of physical activity.;Long Term: Exercising regularly at least 3-5 days a week.       Increase Strength and Stamina  Yes       Intervention  Provide advice, education, support and counseling about physical activity/exercise needs.;Develop an individualized exercise prescription for aerobic and  resistive training based on initial evaluation findings, risk stratification, comorbidities and participant's personal goals.       Expected Outcomes  Short Term: Increase workloads from initial exercise prescription for resistance, speed, and METs.;Short Term: Perform resistance training exercises routinely during rehab and add in resistance training at home;Long Term: Improve cardiorespiratory fitness, muscular endurance and strength as measured by increased METs and functional capacity (6MWT)       Able to understand and use rate of perceived exertion (RPE) scale  Yes       Intervention  Provide education and explanation on how to use RPE scale       Expected Outcomes  Short Term:  Able to use RPE daily in rehab to express subjective intensity level;Long Term:  Able to use RPE to guide intensity level when exercising independently       Knowledge and understanding of Target Heart Rate Range (THRR)  Yes       Intervention  Provide education and explanation of THRR including how the numbers were predicted and where they are located for reference       Expected Outcomes  Short Term: Able to state/look up THRR;Long Term: Able to use THRR to govern intensity when exercising independently;Short Term: Able to use daily as guideline for intensity in rehab       Able to check pulse independently  Yes       Intervention  Provide education and demonstration on how to check pulse in carotid and radial arteries.;Review the importance of being able to check your own pulse for safety during independent exercise       Expected Outcomes  Short Term: Able to explain why pulse checking is important during independent exercise;Long Term: Able to check pulse independently and accurately       Understanding of Exercise Prescription  Yes       Intervention  Provide education, explanation, and written materials on patient's individual exercise prescription       Expected Outcomes  Short Term: Able to explain program exercise  prescription;Long Term: Able to explain home exercise prescription to exercise independently          Nutrition & Weight - Outcomes: Pre Biometrics - 10/31/17 1107      Pre Biometrics   Height  5' 10.25" (1.784 m)    Weight  94.1 kg    Waist Circumference  40 inches    Hip Circumference  41.5 inches    Waist to Hip Ratio  0.96 %    BMI (Calculated)  29.57    Triceps Skinfold  23 mm    % Body Fat  29.5 %    Grip Strength  43 kg    Flexibility  12 in    Single Leg Stand  30 seconds      Post Biometrics - 12/02/17 1509       Post  Biometrics   Height  5' 10.25" (1.784 m)    Weight  94.8 kg    Waist Circumference  40 inches    Hip Circumference  41 inches    Waist to Hip Ratio  0.98 %    BMI (Calculated)  29.79    Triceps Skinfold  21 mm    % Body Fat  29.2 %    Grip Strength  44 kg    Flexibility  16 in    Single Leg Stand  35 seconds       Nutrition: Nutrition Therapy & Goals - 11/06/17 0805      Nutrition Therapy   Diet  consistent carbohydrate heart healthy      Personal Nutrition Goals   Nutrition Goal  Pt to identify food quantities necessary to achieve weight loss of 6-20 lbs. at graduation from cardiac rehab. Goal wt of 20 lb desired.     Personal Goal #2  Pt to try new mediterranean style recipes, explore a variety of food choices      Intervention Plan   Intervention  Prescribe, educate and counsel regarding individualized specific dietary modifications aiming towards targeted core components such as weight, hypertension, lipid management, diabetes, heart failure and other comorbidities.    Expected Outcomes  Short  Term Goal: Understand basic principles of dietary content, such as calories, fat, sodium, cholesterol and nutrients.       Nutrition Discharge: Nutrition Assessments - 12/06/17 1003      MEDFICTS Scores   Pre Score  12    Post Score  --   did not return survey      Education Questionnaire Score: Knowledge Questionnaire Score - 12/02/17  1423      Knowledge Questionnaire Score   Pre Score  23/24    Post Score  24/24       Goals reviewed with patient; copy given to patient.

## 2017-12-13 ENCOUNTER — Encounter (HOSPITAL_COMMUNITY): Payer: BLUE CROSS/BLUE SHIELD

## 2017-12-16 ENCOUNTER — Encounter (HOSPITAL_COMMUNITY): Payer: BLUE CROSS/BLUE SHIELD

## 2017-12-17 DIAGNOSIS — H5712 Ocular pain, left eye: Secondary | ICD-10-CM | POA: Diagnosis not present

## 2017-12-17 DIAGNOSIS — H2513 Age-related nuclear cataract, bilateral: Secondary | ICD-10-CM | POA: Diagnosis not present

## 2017-12-17 DIAGNOSIS — H16403 Unspecified corneal neovascularization, bilateral: Secondary | ICD-10-CM | POA: Diagnosis not present

## 2017-12-17 DIAGNOSIS — H43812 Vitreous degeneration, left eye: Secondary | ICD-10-CM | POA: Diagnosis not present

## 2017-12-18 ENCOUNTER — Encounter (HOSPITAL_COMMUNITY): Payer: BLUE CROSS/BLUE SHIELD

## 2017-12-20 ENCOUNTER — Encounter (HOSPITAL_COMMUNITY): Payer: BLUE CROSS/BLUE SHIELD

## 2017-12-24 ENCOUNTER — Ambulatory Visit (INDEPENDENT_AMBULATORY_CARE_PROVIDER_SITE_OTHER): Payer: BLUE CROSS/BLUE SHIELD | Admitting: Family Medicine

## 2017-12-24 VITALS — BP 113/68 | HR 44 | Temp 98.2°F | Ht 71.0 in | Wt 208.0 lb

## 2017-12-24 DIAGNOSIS — E669 Obesity, unspecified: Secondary | ICD-10-CM

## 2017-12-24 DIAGNOSIS — Z9189 Other specified personal risk factors, not elsewhere classified: Secondary | ICD-10-CM | POA: Diagnosis not present

## 2017-12-24 DIAGNOSIS — E559 Vitamin D deficiency, unspecified: Secondary | ICD-10-CM

## 2017-12-24 DIAGNOSIS — Z6830 Body mass index (BMI) 30.0-30.9, adult: Secondary | ICD-10-CM | POA: Diagnosis not present

## 2017-12-24 MED ORDER — VITAMIN D (ERGOCALCIFEROL) 1.25 MG (50000 UNIT) PO CAPS: 50000.0000 [IU] | ORAL_CAPSULE | ORAL | 0 refills | Status: DC

## 2017-12-24 NOTE — Progress Notes (Signed)
Office: [phone]  /  Fax: [phone]   HPI:   Chief Complaint: OBESITY Shane Wood is here to discuss his progress with his obesity treatment plan. He is on the portion control better and make smarter food choices plan and is following his eating plan approximately 50 % of the time. He states he is exercising on the Peloton bike and at the gym 30 to 45 minutes 5 times per week. Shane Wood has done well maintaining his weight during overseas traveling and he has increased his walking. His weight is 208 lb (94.3 kg) today and has not lost weight since his last visit. He has lost 8 lbs since starting treatment with [name].  Vitamin D deficiency Shane Wood has a diagnosis of vitamin D deficiency. He is stable on vit D, but he is not yet at goal (recent labs reviewed). Shane Wood denies nausea, vomiting or muscle weakness.  At risk for osteopenia and osteoporosis Shane Wood is at higher risk of osteopenia and osteoporosis due to vitamin D deficiency.   ALLERGIES: No Known Allergies  MEDICATIONS: Current Outpatient Medications on File Prior to Visit  Medication Sig Dispense Refill  . Ascorbic Acid (VITAMIN C) 1000 MG tablet Take 1,000 mg by mouth daily.    [name] aspirin EC 81 MG EC tablet Take 1 tablet (81 mg total) by mouth daily. 30 tablet 11  . atorvastatin (LIPITOR) 80 MG tablet Take 1 tablet (80 mg total) by mouth daily at 6 PM. 30 tablet 11  . Cyanocobalamin (VITAMIN B 12 PO) Take 1 tablet by mouth daily.    [name] ketorolac (ACULAR) 0.5 % ophthalmic solution Place 0.5 drops into both eyes as needed.    . metoprolol tartrate (LOPRESSOR) 25 MG tablet Take 0.5 tablets (12.5 mg total) by mouth 2 (two) times daily. 60 tablet 11  . Multiple Vitamin (MULTIVITAMIN) tablet Take 1 tablet by mouth daily.      . nitroGLYCERIN (NITROSTAT) 0.4 MG SL tablet Place 1 tablet (0.4 mg total) under the tongue every 5 (five) minutes x 3 doses as needed for chest pain. 25 tablet 11  . omeprazole (PRILOSEC) 20 MG capsule Take  20 mg by mouth daily.    . ticagrelor (BRILINTA) 90 MG TABS tablet Take 1 tablet (90 mg total) by mouth 2 (two) times daily. 60 tablet 0   No current facility-administered medications on file prior to visit.     PAST MEDICAL HISTORY: Past Medical History:  Diagnosis Date  . Allergy   . BACK PAIN   . ELEVATED BP READING WITHOUT DX HYPERTENSION 05/03/2008   Qualifier: Diagnosis of  By: Larose Kells MD, [place] GERD (gastroesophageal reflux disease)   . H/O cardiovascular stress test 2005    (-)  . Hyperlipidemia   . INSOMNIA-SLEEP DISORDER-UNSPEC   . PVC (premature ventricular contraction)     PAST SURGICAL HISTORY: Past Surgical History:  Procedure Laterality Date  . ARTERY REPAIR Right 08/23/2017   Procedure: EXPLORATION RIGHT RADIAL ARTERY,  RIGHT FOREARM FASCIOTOMY;  Surgeon: Elam Dutch, MD;  Location: [facility] OR;  Service: Vascular;  Laterality: Right;  . CHOLECYSTECTOMY    . CORONARY STENT INTERVENTION N/A 08/23/2017   Procedure: CORONARY STENT INTERVENTION;  Surgeon: Jettie Booze, MD;  Location: [facility];  Service: Cardiovascular;  Laterality: N/A;  . LEFT HEART CATH AND CORONARY ANGIOGRAPHY N/A 08/23/2017   Procedure: LEFT HEART CATH AND CORONARY ANGIOGRAPHY;  Surgeon: Jettie Booze, MD;  Location: [facility];  Service: Cardiovascular;  Laterality: N/A;  . POLYPECTOMY    . TONSILLECTOMY     age 62 y/o    SOCIAL HISTORY: Social History   Tobacco Use  . Smoking status: Former Smoker    Last attempt to quit: 09/24/1988    Years since quitting: 29.2  . Smokeless tobacco: Former Systems developer    Types: Chew    Quit date: 1970  Substance Use Topics  . Alcohol use: Yes    Alcohol/week: 4.0 standard drinks    Types: 4 Glasses of wine per week    Comment: occasionally on weekends  . Drug use: No    FAMILY HISTORY: Family History  Problem Relation Age of Onset  . Lung cancer Father        smoker  . Diabetes Maternal Grandmother   . Diabetes Maternal  Grandfather   . Hypertension Mother   . Depression Mother   . Obesity Mother   . Heart attack Other        uncle MI at age 1s  . Stroke Other        GM, in her 19  . Colon cancer Neg Hx   . Prostate cancer Neg Hx   . Colon polyps Neg Hx   . Esophageal cancer Neg Hx   . Rectal cancer Neg Hx   . Stomach cancer Neg Hx   . Pancreatic cancer Neg Hx     ROS: Review of Systems  Constitutional: Negative for weight loss.  Gastrointestinal: Negative for nausea and vomiting.  Musculoskeletal:       Negative for muscle weakness    PHYSICAL EXAM: Blood pressure 113/68, pulse (!) 44, temperature 98.2 F (36.8 C), temperature source Oral, height 5\' 11"  (1.803 m), weight 208 lb (94.3 kg), SpO2 98 %. Body mass index is 29.01 kg/m. Physical Exam  Constitutional: He is oriented to person, place, and time. He appears well-developed and well-nourished.  Cardiovascular: Normal rate.  Pulmonary/Chest: Effort normal.  Musculoskeletal: Normal range of motion.  Neurological: He is oriented to person, place, and time.  Skin: Skin is warm and dry.  Psychiatric: He has a normal mood and affect. His behavior is normal.  Vitals reviewed.   RECENT LABS AND TESTS: BMET    Component Value Date/Time   NA 143 11/26/2017 0827   K 4.7 11/26/2017 0827   CL 107 (H) 11/26/2017 0827   CO2 22 11/26/2017 0827   GLUCOSE 96 11/26/2017 0827   GLUCOSE 149 (H) 08/24/2017 0257   BUN 24 11/26/2017 0827   CREATININE 1.21 11/26/2017 0827   CALCIUM 9.4 11/26/2017 0827   GFRNONAA 64 11/26/2017 0827   GFRAA 74 11/26/2017 0827   Lab Results  Component Value Date   HGBA1C 5.5 11/26/2017   HGBA1C 5.7 (H) 08/08/2017   Lab Results  Component Value Date   INSULIN 8.7 11/26/2017   INSULIN 11.8 08/08/2017   CBC    Component Value Date/Time   WBC 12.1 (H) 08/24/2017 0257   RBC 3.93 (L) 08/24/2017 0257   HGB 11.0 (L) 08/24/2017 0257   HCT 33.6 (L) 08/24/2017 0257   PLT 204 08/24/2017 0257   MCV 85.5  08/24/2017 0257   MCH 28.0 08/24/2017 0257   MCHC 32.7 08/24/2017 0257   RDW 14.8 08/24/2017 0257   LYMPHSABS 1.7 07/15/2017 0828   MONOABS 0.4 07/15/2017 0828   EOSABS 0.2 07/15/2017 0828   BASOSABS 0.0 07/15/2017 0828   Iron/TIBC/Ferritin/ %Sat No results found for: IRON, TIBC, FERRITIN, IRONPCTSAT Lipid Panel  Component Value Date/Time   CHOL 126 10/04/2017 0802   TRIG 143 10/04/2017 0802   HDL 33 (L) 10/04/2017 0802   CHOLHDL 3.8 10/04/2017 0802   CHOLHDL 4 07/15/2017 0828   VLDL 41.0 (H) 07/15/2017 0828   LDLCALC 64 10/04/2017 0802   LDLDIRECT 105.0 07/15/2017 0828   Hepatic Function Panel     Component Value Date/Time   PROT 6.5 11/26/2017 0827   ALBUMIN 4.5 11/26/2017 0827   AST 18 11/26/2017 0827   ALT 29 11/26/2017 0827   ALKPHOS 60 11/26/2017 0827   BILITOT 0.3 11/26/2017 0827   BILIDIR 0.11 10/04/2017 0802      Component Value Date/Time   TSH 2.080 08/08/2017 1016   TSH 2.14 07/15/2017 0828   TSH 1.62 07/08/2015 1405   Results for Arras, Santana A "PAT" (MRN [id number]) as of 12/24/2017 12:42  Ref. Range 11/26/2017 08:27  Vitamin D, 25-Hydroxy Latest Ref Range: 30.0 - 100.0 ng/mL 32.5   ASSESSMENT AND PLAN: Vitamin D deficiency - Plan: Vitamin D, Ergocalciferol, (DRISDOL) 50000 units CAPS capsule  At risk for osteoporosis  Class 1 obesity with serious comorbidity and body mass index (BMI) of 30.0 to 30.9 in adult, unspecified obesity type - Starting BMI greater then 30  PLAN:  Vitamin D Deficiency Kire was informed that low vitamin D levels contributes to fatigue and are associated with obesity, breast, and colon cancer. He agrees to continue to take prescription Vit D @50 ,000 IU every week #4 with no refills and will follow up for routine testing of vitamin D, at least 2-3 times per year. He was informed of the risk of over-replacement of vitamin D and agrees to not increase his dose unless he discusses this with [name] first. We will recheck labs in 3  months and Hillis agrees to follow up as directed.  At risk for osteopenia and osteoporosis Shane Wood was given extended  (15 minutes) osteoporosis prevention counseling today. Shane Wood is at risk for osteopenia and osteoporosis due to his vitamin D deficiency. He was encouraged to take his vitamin D and follow his higher calcium diet and increase strengthening exercise to help strengthen his bones and decrease his risk of osteopenia and osteoporosis.  Obesity Tran is currently in the action stage of change. As such, his goal is to continue with weight loss efforts He has agreed to keep a food journal with 1500 to 1700 calories and 100+ grams of protein daily Shane Wood has been instructed to work up to a goal of 150 minutes of combined cardio and strengthening exercise per week for weight loss and overall health benefits. We discussed the following Behavioral Modification Strategies today: no skipping meals, increasing lean protein intake, decreasing simple carbohydrates , increasing vegetables and work on meal planning and easy cooking plans  Shane Wood has agreed to follow up with our clinic in 4 weeks. He was informed of the importance of frequent follow up visits to maximize his success with intensive lifestyle modifications for his multiple health conditions.   OBESITY BEHAVIORAL INTERVENTION VISIT  Today's visit was # 7   Starting weight: 216 lbs Starting date: 08/08/17 Today's weight : 208 lbs@  Today's date: 12/24/2017 Total lbs lost to date: 8.   ASK: We discussed the diagnosis of obesity with [name] today and Wahid agreed to give [name] permission to discuss obesity behavioral modification therapy today.  ASSESS: Shane Wood has the diagnosis of obesity and his BMI today is 29.02 Shane Wood is in the action stage of change  ADVISE: Shane Wood was educated on the multiple health risks of obesity as well as the benefit of weight loss to improve his health. He was advised of the  need for long term treatment and the importance of lifestyle modifications to improve his current health and to decrease his risk of future health problems.  AGREE: Multiple dietary modification options and treatment options were discussed and  Shane Wood agreed to follow the recommendations documented in the above note.  ARRANGE: [place] was educated on the importance of frequent visits to treat obesity as outlined per CMS and USPSTF guidelines and agreed to schedule his next follow up appointment today.  I, Doreene Nest, am acting as transcriptionist for Dennard Nip, MD  I have reviewed the above documentation for accuracy and completeness, and I agree with the above. -Dennard Nip, MD  I have reviewed the above documentation for accuracy and completeness, and I agree with the above. -Dennard Nip, MD

## 2017-12-25 ENCOUNTER — Encounter (HOSPITAL_COMMUNITY): Payer: BLUE CROSS/BLUE SHIELD

## 2017-12-27 ENCOUNTER — Encounter (HOSPITAL_COMMUNITY): Payer: BLUE CROSS/BLUE SHIELD

## 2017-12-30 ENCOUNTER — Encounter (HOSPITAL_COMMUNITY): Payer: BLUE CROSS/BLUE SHIELD

## 2018-01-01 ENCOUNTER — Encounter (HOSPITAL_COMMUNITY): Payer: BLUE CROSS/BLUE SHIELD

## 2018-01-03 ENCOUNTER — Encounter (HOSPITAL_COMMUNITY): Payer: BLUE CROSS/BLUE SHIELD

## 2018-01-06 ENCOUNTER — Encounter (HOSPITAL_COMMUNITY): Payer: BLUE CROSS/BLUE SHIELD

## 2018-01-08 ENCOUNTER — Encounter (HOSPITAL_COMMUNITY): Payer: BLUE CROSS/BLUE SHIELD

## 2018-01-10 ENCOUNTER — Encounter (HOSPITAL_COMMUNITY): Payer: BLUE CROSS/BLUE SHIELD

## 2018-01-13 ENCOUNTER — Encounter (HOSPITAL_COMMUNITY): Payer: BLUE CROSS/BLUE SHIELD

## 2018-01-15 ENCOUNTER — Encounter (HOSPITAL_COMMUNITY): Payer: BLUE CROSS/BLUE SHIELD

## 2018-01-17 ENCOUNTER — Encounter (HOSPITAL_COMMUNITY): Payer: BLUE CROSS/BLUE SHIELD

## 2018-01-20 ENCOUNTER — Encounter (HOSPITAL_COMMUNITY): Payer: BLUE CROSS/BLUE SHIELD

## 2018-01-21 ENCOUNTER — Ambulatory Visit (INDEPENDENT_AMBULATORY_CARE_PROVIDER_SITE_OTHER): Payer: BLUE CROSS/BLUE SHIELD | Admitting: Family Medicine

## 2018-01-21 VITALS — BP 109/66 | HR 46 | Temp 98.2°F | Ht 71.0 in | Wt 207.0 lb

## 2018-01-21 DIAGNOSIS — E669 Obesity, unspecified: Secondary | ICD-10-CM

## 2018-01-21 DIAGNOSIS — E66811 Obesity, class 1: Secondary | ICD-10-CM

## 2018-01-21 DIAGNOSIS — Z6830 Body mass index (BMI) 30.0-30.9, adult: Secondary | ICD-10-CM

## 2018-01-21 DIAGNOSIS — E559 Vitamin D deficiency, unspecified: Secondary | ICD-10-CM

## 2018-01-21 DIAGNOSIS — Z9189 Other specified personal risk factors, not elsewhere classified: Secondary | ICD-10-CM | POA: Diagnosis not present

## 2018-01-21 MED ORDER — VITAMIN D (ERGOCALCIFEROL) 1.25 MG (50000 UNIT) PO CAPS
50000.0000 [IU] | ORAL_CAPSULE | ORAL | 0 refills | Status: DC
Start: 2018-01-21 — End: 2018-02-18

## 2018-01-21 NOTE — Progress Notes (Signed)
Office: [phone]  /  Fax: [phone]   HPI:   Chief Complaint: OBESITY Shane Wood is here to discuss his progress with his obesity treatment plan. He is on the keep a food journal with 1500 to 1700 calories and 100+ grams of protein daily and is following his eating plan approximately 25 % of the time. He states he is riding the [employer] 30 minutes 3 to 4 times per week. [name] continues to lose weight even with increased traveling and eating out. He hasn't been able to journal closely, but he was mindful of his food choices and he worked on increasing lean protein and vegetables. He has three weeks to get back to journaling before his next trip to [place]. His weight is 207 lb (93.9 kg) today and has had a weight loss of 1 pound over a period of 4 weeks since his last visit. He has lost 9 lbs since starting treatment with [name].  Vitamin D deficiency [place] has a diagnosis of vitamin D deficiency. Shane Wood is stable on vit D and he denies nausea, vomiting or muscle weakness. His last level was not at goal, but his fatigue is slowly improving.  At risk for osteopenia and osteoporosis [name] is at higher risk of osteopenia and osteoporosis due to vitamin D deficiency.   ALLERGIES: No Known Allergies  MEDICATIONS: Current Outpatient Medications on File Prior to Visit  Medication Sig Dispense Refill  . Ascorbic Acid (VITAMIN C) 1000 MG tablet Take 1,000 mg by mouth daily.    [name] aspirin EC 81 MG EC tablet Take 1 tablet (81 mg total) by mouth daily. 30 tablet 11  . atorvastatin (LIPITOR) 80 MG tablet Take 1 tablet (80 mg total) by mouth daily at 6 PM. 30 tablet 11  . Cyanocobalamin (VITAMIN B 12 PO) Take 1 tablet by mouth daily.    [name] ketorolac (ACULAR) 0.5 % ophthalmic solution Place 0.5 drops into both eyes as needed.    . metoprolol tartrate (LOPRESSOR) 25 MG tablet Take 0.5 tablets (12.5 mg total) by mouth 2 (two) times daily. 60 tablet 11  . Multiple Vitamin (MULTIVITAMIN) tablet  Take 1 tablet by mouth daily.      . nitroGLYCERIN (NITROSTAT) 0.4 MG SL tablet Place 1 tablet (0.4 mg total) under the tongue every 5 (five) minutes x 3 doses as needed for chest pain. 25 tablet 11  . omeprazole (PRILOSEC) 20 MG capsule Take 20 mg by mouth daily.    . ticagrelor (BRILINTA) 90 MG TABS tablet Take 1 tablet (90 mg total) by mouth 2 (two) times daily. 60 tablet 0   No current facility-administered medications on file prior to visit.     PAST MEDICAL HISTORY: Past Medical History:  Diagnosis Date  . Allergy   . BACK PAIN   . ELEVATED BP READING WITHOUT DX HYPERTENSION 05/03/2008   Qualifier: Diagnosis of  By: Larose Kells MD, [place] GERD (gastroesophageal reflux disease)   . H/O cardiovascular stress test 2005    (-)  . Hyperlipidemia   . INSOMNIA-SLEEP DISORDER-UNSPEC   . PVC (premature ventricular contraction)     PAST SURGICAL HISTORY: Past Surgical History:  Procedure Laterality Date  . ARTERY REPAIR Right 08/23/2017   Procedure: EXPLORATION RIGHT RADIAL ARTERY,  RIGHT FOREARM FASCIOTOMY;  Surgeon: Elam Dutch, MD;  Location: [facility] OR;  Service: Vascular;  Laterality: Right;  . CHOLECYSTECTOMY    . CORONARY STENT INTERVENTION N/A 08/23/2017   Procedure: CORONARY STENT INTERVENTION;  Surgeon:  Jettie Booze, MD;  Location: [facility];  Service: Cardiovascular;  Laterality: N/A;  . LEFT HEART CATH AND CORONARY ANGIOGRAPHY N/A 08/23/2017   Procedure: LEFT HEART CATH AND CORONARY ANGIOGRAPHY;  Surgeon: Jettie Booze, MD;  Location: [facility];  Service: Cardiovascular;  Laterality: N/A;  . POLYPECTOMY    . TONSILLECTOMY     age 62 y/o    SOCIAL HISTORY: Social History   Tobacco Use  . Smoking status: Former Smoker    Last attempt to quit: 09/24/1988    Years since quitting: 29.3  . Smokeless tobacco: Former Systems developer    Types: Chew    Quit date: 1970  Substance Use Topics  . Alcohol use: Yes    Alcohol/week: 4.0 standard drinks    Types: 4  Glasses of wine per week    Comment: occasionally on weekends  . Drug use: No    FAMILY HISTORY: Family History  Problem Relation Age of Onset  . Lung cancer Father        smoker  . Diabetes Maternal Grandmother   . Diabetes Maternal Grandfather   . Hypertension Mother   . Depression Mother   . Obesity Mother   . Heart attack Other        uncle MI at age 63s  . Stroke Other        GM, in her 66  . Colon cancer Neg Hx   . Prostate cancer Neg Hx   . Colon polyps Neg Hx   . Esophageal cancer Neg Hx   . Rectal cancer Neg Hx   . Stomach cancer Neg Hx   . Pancreatic cancer Neg Hx     ROS: Review of Systems  Constitutional: Positive for malaise/fatigue and weight loss.  Gastrointestinal: Negative for nausea and vomiting.  Musculoskeletal:       Negative for muscle weakness    PHYSICAL EXAM: Blood pressure 109/66, pulse (!) 46, temperature 98.2 F (36.8 C), temperature source Oral, height 5\' 11"  (1.803 m), weight 207 lb (93.9 kg), SpO2 98 %. Body mass index is 28.87 kg/m. Physical Exam  Constitutional: He is oriented to person, place, and time. He appears well-developed and well-nourished.  Cardiovascular: Normal rate.  Pulmonary/Chest: Effort normal.  Musculoskeletal: Normal range of motion.  Neurological: He is oriented to person, place, and time.  Skin: Skin is warm and dry.  Psychiatric: He has a normal mood and affect. His behavior is normal.  Vitals reviewed.   RECENT LABS AND TESTS: BMET    Component Value Date/Time   NA 143 11/26/2017 0827   K 4.7 11/26/2017 0827   CL 107 (H) 11/26/2017 0827   CO2 22 11/26/2017 0827   GLUCOSE 96 11/26/2017 0827   GLUCOSE 149 (H) 08/24/2017 0257   BUN 24 11/26/2017 0827   CREATININE 1.21 11/26/2017 0827   CALCIUM 9.4 11/26/2017 0827   GFRNONAA 64 11/26/2017 0827   GFRAA 74 11/26/2017 0827   Lab Results  Component Value Date   HGBA1C 5.5 11/26/2017   HGBA1C 5.7 (H) 08/08/2017   Lab Results  Component Value Date     INSULIN 8.7 11/26/2017   INSULIN 11.8 08/08/2017   CBC    Component Value Date/Time   WBC 12.1 (H) 08/24/2017 0257   RBC 3.93 (L) 08/24/2017 0257   HGB 11.0 (L) 08/24/2017 0257   HCT 33.6 (L) 08/24/2017 0257   PLT 204 08/24/2017 0257   MCV 85.5 08/24/2017 0257   MCH 28.0 08/24/2017  0257   MCHC 32.7 08/24/2017 0257   RDW 14.8 08/24/2017 0257   LYMPHSABS 1.7 07/15/2017 0828   MONOABS 0.4 07/15/2017 0828   EOSABS 0.2 07/15/2017 0828   BASOSABS 0.0 07/15/2017 0828   Iron/TIBC/Ferritin/ %Sat No results found for: IRON, TIBC, FERRITIN, IRONPCTSAT Lipid Panel     Component Value Date/Time   CHOL 126 10/04/2017 0802   TRIG 143 10/04/2017 0802   HDL 33 (L) 10/04/2017 0802   CHOLHDL 3.8 10/04/2017 0802   CHOLHDL 4 07/15/2017 0828   VLDL 41.0 (H) 07/15/2017 0828   LDLCALC 64 10/04/2017 0802   LDLDIRECT 105.0 07/15/2017 0828   Hepatic Function Panel     Component Value Date/Time   PROT 6.5 11/26/2017 0827   ALBUMIN 4.5 11/26/2017 0827   AST 18 11/26/2017 0827   ALT 29 11/26/2017 0827   ALKPHOS 60 11/26/2017 0827   BILITOT 0.3 11/26/2017 0827   BILIDIR 0.11 10/04/2017 0802      Component Value Date/Time   TSH 2.080 08/08/2017 1016   TSH 2.14 07/15/2017 0828   TSH 1.62 07/08/2015 1405   Results for Dobias, Emet A "PAT" (MRN [id number]) as of 01/21/2018 08:40  Ref. Range 11/26/2017 08:27  Vitamin D, 25-Hydroxy Latest Ref Range: 30.0 - 100.0 ng/mL 32.5   ASSESSMENT AND PLAN: Vitamin D deficiency - Plan: Vitamin D, Ergocalciferol, (DRISDOL) 50000 units CAPS capsule  At risk for osteoporosis  Class 1 obesity with serious comorbidity and body mass index (BMI) of 30.0 to 30.9 in adult, unspecified obesity type - Starting BMI greater then 30  PLAN:  Vitamin D Deficiency Adrian was informed that low vitamin D levels contributes to fatigue and are associated with obesity, breast, and colon cancer. He agrees to continue to take prescription Vit D @50 ,000 IU every week #4  with no refills and will follow up for routine testing of vitamin D, at least 2-3 times per year. He was informed of the risk of over-replacement of vitamin D and agrees to not increase his dose unless he discusses this with [name] first. [name] agrees to follow up as directed.  At risk for osteopenia and osteoporosis Keandre was given extended  (15 minutes) osteoporosis prevention counseling today. Faysal is at risk for osteopenia and osteoporosis due to his vitamin D deficiency. He was encouraged to take his vitamin D and follow his higher calcium diet and increase strengthening exercise to help strengthen his bones and decrease his risk of osteopenia and osteoporosis.  Obesity Ronnald is currently in the action stage of change. As such, his goal is to continue with weight loss efforts He has agreed to keep a food journal with 1500 to 1700 calories and 100+ grams of protein daily Alexandr has been instructed to work up to a goal of 150 minutes of combined cardio and strengthening exercise per week or continue riding the Pel[employer] bike 30 minutes 3 to 4 times per week for weight loss and overall health benefits. We discussed the following Behavioral Modification Strategies today: travel eating strategies, increasing lean protein intake, decreasing simple carbohydrates , work on meal planning and easy cooking plans and holiday eating strategies   Chistopher has agreed to follow up with our clinic in 6 weeks. He was informed of the importance of frequent follow up visits to maximize his success with intensive lifestyle modifications for his multiple health conditions.   OBESITY BEHAVIORAL INTERVENTION VISIT  Today's visit was # 8   Starting weight: 216 lbs Starting date: 08/08/17 Today's weight :  207 lbs Today's date: 01/21/2018 Total lbs lost to date: 9   ASK: We discussed the diagnosis of obesity with [name] today and Rhodes agreed to give [name] permission to discuss obesity behavioral  modification therapy today.  ASSESS: Jamesmichael has the diagnosis of obesity and his BMI today is 28.88 Yuri is in the action stage of change   ADVISE: Jhordan was educated on the multiple health risks of obesity as well as the benefit of weight loss to improve his health. He was advised of the need for long term treatment and the importance of lifestyle modifications to improve his current health and to decrease his risk of future health problems.  AGREE: Multiple dietary modification options and treatment options were discussed and  [name] agreed to follow the recommendations documented in the above note.  ARRANGE: Ora was educated on the importance of frequent visits to treat obesity as outlined per CMS and USPSTF guidelines and agreed to schedule his next follow up appointment today.  I, Doreene Nest, am acting as transcriptionist for Dennard Nip, MD  I have reviewed the above documentation for accuracy and completeness, and I agree with the above. -Dennard Nip, MD

## 2018-01-22 ENCOUNTER — Encounter (HOSPITAL_COMMUNITY): Payer: BLUE CROSS/BLUE SHIELD

## 2018-01-24 ENCOUNTER — Encounter (HOSPITAL_COMMUNITY): Payer: BLUE CROSS/BLUE SHIELD

## 2018-01-27 ENCOUNTER — Encounter (HOSPITAL_COMMUNITY): Payer: BLUE CROSS/BLUE SHIELD

## 2018-01-29 ENCOUNTER — Encounter (HOSPITAL_COMMUNITY): Payer: BLUE CROSS/BLUE SHIELD

## 2018-01-31 ENCOUNTER — Encounter (HOSPITAL_COMMUNITY): Payer: BLUE CROSS/BLUE SHIELD

## 2018-02-03 ENCOUNTER — Encounter (HOSPITAL_COMMUNITY): Payer: BLUE CROSS/BLUE SHIELD

## 2018-02-05 ENCOUNTER — Encounter (HOSPITAL_COMMUNITY): Payer: BLUE CROSS/BLUE SHIELD

## 2018-02-07 ENCOUNTER — Encounter (HOSPITAL_COMMUNITY): Payer: BLUE CROSS/BLUE SHIELD

## 2018-02-10 ENCOUNTER — Encounter (HOSPITAL_COMMUNITY): Payer: BLUE CROSS/BLUE SHIELD

## 2018-02-12 ENCOUNTER — Encounter: Payer: Self-pay | Admitting: Internal Medicine

## 2018-02-12 ENCOUNTER — Encounter (HOSPITAL_COMMUNITY): Payer: BLUE CROSS/BLUE SHIELD

## 2018-02-14 ENCOUNTER — Encounter (HOSPITAL_COMMUNITY): Payer: BLUE CROSS/BLUE SHIELD

## 2018-02-17 ENCOUNTER — Encounter (HOSPITAL_COMMUNITY): Payer: BLUE CROSS/BLUE SHIELD

## 2018-02-18 ENCOUNTER — Other Ambulatory Visit (INDEPENDENT_AMBULATORY_CARE_PROVIDER_SITE_OTHER): Payer: Self-pay | Admitting: Family Medicine

## 2018-02-18 DIAGNOSIS — E559 Vitamin D deficiency, unspecified: Secondary | ICD-10-CM

## 2018-02-19 ENCOUNTER — Encounter (HOSPITAL_COMMUNITY): Payer: BLUE CROSS/BLUE SHIELD

## 2018-02-21 ENCOUNTER — Encounter (HOSPITAL_COMMUNITY): Payer: BLUE CROSS/BLUE SHIELD

## 2018-03-04 ENCOUNTER — Ambulatory Visit (INDEPENDENT_AMBULATORY_CARE_PROVIDER_SITE_OTHER): Payer: BLUE CROSS/BLUE SHIELD | Admitting: Family Medicine

## 2018-03-04 VITALS — BP 121/74 | HR 53 | Temp 98.1°F | Ht 71.0 in | Wt 210.0 lb

## 2018-03-04 DIAGNOSIS — Z9189 Other specified personal risk factors, not elsewhere classified: Secondary | ICD-10-CM

## 2018-03-04 DIAGNOSIS — E88819 Insulin resistance, unspecified: Secondary | ICD-10-CM

## 2018-03-04 DIAGNOSIS — E7849 Other hyperlipidemia: Secondary | ICD-10-CM | POA: Diagnosis not present

## 2018-03-04 DIAGNOSIS — E8881 Metabolic syndrome: Secondary | ICD-10-CM

## 2018-03-04 DIAGNOSIS — E669 Obesity, unspecified: Secondary | ICD-10-CM

## 2018-03-04 DIAGNOSIS — E559 Vitamin D deficiency, unspecified: Secondary | ICD-10-CM | POA: Diagnosis not present

## 2018-03-04 DIAGNOSIS — Z6830 Body mass index (BMI) 30.0-30.9, adult: Secondary | ICD-10-CM

## 2018-03-04 MED ORDER — VITAMIN D (ERGOCALCIFEROL) 1.25 MG (50000 UNIT) PO CAPS: 50000.0000 [IU] | ORAL_CAPSULE | ORAL | 0 refills | Status: DC

## 2018-03-05 ENCOUNTER — Encounter (INDEPENDENT_AMBULATORY_CARE_PROVIDER_SITE_OTHER): Payer: Self-pay | Admitting: Family Medicine

## 2018-03-05 LAB — COMPREHENSIVE METABOLIC PANEL
ALT: 29 IU/L (ref 0–44)
AST: 17 IU/L (ref 0–40)
Albumin/Globulin Ratio: 1.8 (ref 1.2–2.2)
Albumin: 4.5 g/dL (ref 3.6–4.8)
Alkaline Phosphatase: 61 IU/L (ref 39–117)
BUN/Creatinine Ratio: 22 (ref 10–24)
BUN: 25 mg/dL (ref 8–27)
Bilirubin Total: 0.4 mg/dL (ref 0.0–1.2)
CALCIUM: 9.4 mg/dL (ref 8.6–10.2)
CO2: 20 mmol/L (ref 20–29)
CREATININE: 1.13 mg/dL (ref 0.76–1.27)
Chloride: 103 mmol/L (ref 96–106)
GFR, EST AFRICAN AMERICAN: 80 mL/min/{1.73_m2} (ref >59)
GFR, EST NON AFRICAN AMERICAN: 69 mL/min/{1.73_m2} (ref >59)
GLUCOSE: 91 mg/dL (ref 65–99)
Globulin, Total: 2.5 g/dL (ref 1.5–4.5)
POTASSIUM: 4.5 mmol/L (ref 3.5–5.2)
Sodium: 141 mmol/L (ref 134–144)
TOTAL PROTEIN: 7 g/dL (ref 6.0–8.5)

## 2018-03-05 LAB — VITAMIN D 25 HYDROXY (VIT D DEFICIENCY, FRACTURES): VIT D 25 HYDROXY: 26.3 ng/mL — AB (ref 30.0–100.0)

## 2018-03-05 LAB — LIPID PANEL WITH LDL/HDL RATIO
CHOLESTEROL TOTAL: 175 mg/dL (ref 100–199)
HDL: 39 mg/dL — ABNORMAL LOW (ref >39)
LDL Calculated: 67 mg/dL (ref 0–99)
LDl/HDL Ratio: 1.7 ratio (ref 0.0–3.6)
TRIGLYCERIDES: 343 mg/dL — AB (ref 0–149)
VLDL Cholesterol Cal: 69 mg/dL — ABNORMAL HIGH (ref 5–40)

## 2018-03-05 LAB — HEMOGLOBIN A1C
ESTIMATED AVERAGE GLUCOSE: 117 mg/dL
Hgb A1c MFr Bld: 5.7 % — ABNORMAL HIGH (ref 4.8–5.6)

## 2018-03-05 LAB — INSULIN, RANDOM: INSULIN: 17.8 u[IU]/mL (ref 2.6–24.9)

## 2018-03-05 NOTE — Progress Notes (Signed)
Office: [phone]  /  Fax: [phone]   HPI:   Chief Complaint: OBESITY Shane Wood is here to discuss his progress with his obesity treatment plan. He is keeping a food journal with 1500 to 1700 calories and 100+ grams of protein and is following his eating plan approximately 10 % of the time. He states he is exercising 0 minutes 0 times per week. Shane Wood has been traveling internationally in the last month. He is planning on getting back on track with journaling over the next month.  His weight is 210 lb (95.3 kg) today and has had a weight gain of 3 pounds over a period of 6 weeks since his last visit. He has lost 6 lbs since starting treatment with [name].  Vitamin D deficiency Shane Wood has a diagnosis of vitamin D deficiency. He is currently taking vit D and his last level was not at goal. He denies nausea, vomiting, or muscle weakness.  At risk for osteopenia and osteoporosis Shane Wood is at higher risk of osteopenia and osteoporosis due to vitamin D deficiency.   Hyperlipidemia Shane Wood has hyperlipidemia and has been trying to improve his cholesterol levels with intensive lifestyle modification including a low saturated fat diet, exercise and weight loss. He is taking atorvastatin 80mg  and he denies any chest pain or myalgias. He is working on his diet.  Insulin Resistance Shane Wood has a diagnosis of insulin resistance based on his elevated fasting insulin level >5. Although Kieth's blood glucose readings are still under good control, insulin resistance puts him at greater risk of metabolic syndrome and diabetes. He is not taking metformin currently and continues to work on diet and exercise to decrease risk of diabetes. He is having decreased polyphagia and is due for labs today.  ALLERGIES: No Known Allergies  MEDICATIONS: Current Outpatient Medications on File Prior to Visit  Medication Sig Dispense Refill  . Ascorbic Acid (VITAMIN C) 1000 MG tablet Take 1,000 mg by mouth daily.     [name] aspirin EC 81 MG EC tablet Take 1 tablet (81 mg total) by mouth daily. 30 tablet 11  . atorvastatin (LIPITOR) 80 MG tablet Take 1 tablet (80 mg total) by mouth daily at 6 PM. 30 tablet 11  . Cyanocobalamin (VITAMIN B 12 PO) Take 1 tablet by mouth daily.    [name] ketorolac (ACULAR) 0.5 % ophthalmic solution Place 0.5 drops into both eyes as needed.    . metoprolol tartrate (LOPRESSOR) 25 MG tablet Take 0.5 tablets (12.5 mg total) by mouth 2 (two) times daily. 60 tablet 11  . Multiple Vitamin (MULTIVITAMIN) tablet Take 1 tablet by mouth daily.      . nitroGLYCERIN (NITROSTAT) 0.4 MG SL tablet Place 1 tablet (0.4 mg total) under the tongue every 5 (five) minutes x 3 doses as needed for chest pain. 25 tablet 11  . omeprazole (PRILOSEC) 20 MG capsule Take 20 mg by mouth daily.    . ticagrelor (BRILINTA) 90 MG TABS tablet Take 1 tablet (90 mg total) by mouth 2 (two) times daily. 60 tablet 0   No current facility-administered medications on file prior to visit.     PAST MEDICAL HISTORY: Past Medical History:  Diagnosis Date  . Allergy   . BACK PAIN   . ELEVATED BP READING WITHOUT DX HYPERTENSION 05/03/2008   Qualifier: Diagnosis of  By: Larose Kells MD, [place] GERD (gastroesophageal reflux disease)   . H/O cardiovascular stress test 2005    (-)  . Hyperlipidemia   .  INSOMNIA-SLEEP DISORDER-UNSPEC   . PVC (premature ventricular contraction)     PAST SURGICAL HISTORY: Past Surgical History:  Procedure Laterality Date  . ARTERY REPAIR Right 08/23/2017   Procedure: EXPLORATION RIGHT RADIAL ARTERY,  RIGHT FOREARM FASCIOTOMY;  Surgeon: Elam Dutch, MD;  Location: [facility] OR;  Service: Vascular;  Laterality: Right;  . CHOLECYSTECTOMY    . CORONARY STENT INTERVENTION N/A 08/23/2017   Procedure: CORONARY STENT INTERVENTION;  Surgeon: Jettie Booze, MD;  Location: [facility];  Service: Cardiovascular;  Laterality: N/A;  . LEFT HEART CATH AND CORONARY ANGIOGRAPHY N/A 08/23/2017   Procedure:  LEFT HEART CATH AND CORONARY ANGIOGRAPHY;  Surgeon: Jettie Booze, MD;  Location: [facility];  Service: Cardiovascular;  Laterality: N/A;  . POLYPECTOMY    . TONSILLECTOMY     age 62 y/o    SOCIAL HISTORY: Social History   Tobacco Use  . Smoking status: Former Smoker    Last attempt to quit: 09/24/1988    Years since quitting: 29.4  . Smokeless tobacco: Former Systems developer    Types: Chew    Quit date: 1970  Substance Use Topics  . Alcohol use: Yes    Alcohol/week: 4.0 standard drinks    Types: 4 Glasses of wine per week    Comment: occasionally on weekends  . Drug use: No    FAMILY HISTORY: Family History  Problem Relation Age of Onset  . Lung cancer Father        smoker  . Diabetes Maternal Grandmother   . Diabetes Maternal Grandfather   . Hypertension Mother   . Depression Mother   . Obesity Mother   . Heart attack Other        uncle MI at age 51s  . Stroke Other        GM, in her 55  . Colon cancer Neg Hx   . Prostate cancer Neg Hx   . Colon polyps Neg Hx   . Esophageal cancer Neg Hx   . Rectal cancer Neg Hx   . Stomach cancer Neg Hx   . Pancreatic cancer Neg Hx     ROS: Review of Systems  Constitutional: Negative for weight loss.  Cardiovascular: Negative for chest pain.  Gastrointestinal: Negative for nausea and vomiting.  Musculoskeletal: Negative for myalgias.       Negative for muscle weakness.  Endo/Heme/Allergies:       Postive for polyphagia.    PHYSICAL EXAM: Blood pressure 121/74, pulse (!) 53, temperature 98.1 F (36.7 C), temperature source Oral, height 5\' 11"  (1.803 m), weight 210 lb (95.3 kg), SpO2 98 %. Body mass index is 29.29 kg/m. Physical Exam  Constitutional: He is oriented to person, place, and time. He appears well-developed and well-nourished.  Cardiovascular: Normal rate.  Pulmonary/Chest: Effort normal.  Musculoskeletal: Normal range of motion.  Neurological: He is oriented to person, place, and time.  Skin: Skin is  warm and dry.  Psychiatric: He has a normal mood and affect. His behavior is normal.  Vitals reviewed.   RECENT LABS AND TESTS: BMET    Component Value Date/Time   NA 141 03/04/2018 0803   K 4.5 03/04/2018 0803   CL 103 03/04/2018 0803   CO2 20 03/04/2018 0803   GLUCOSE 91 03/04/2018 0803   GLUCOSE 149 (H) 08/24/2017 0257   BUN 25 03/04/2018 0803   CREATININE 1.13 03/04/2018 0803   CALCIUM 9.4 03/04/2018 0803   GFRNONAA 69 03/04/2018 0803   GFRAA 80 03/04/2018  7622   Lab Results  Component Value Date   HGBA1C 5.7 (H) 03/04/2018   HGBA1C 5.5 11/26/2017   HGBA1C 5.7 (H) 08/08/2017   Lab Results  Component Value Date   INSULIN WILL FOLLOW 03/04/2018   INSULIN 8.7 11/26/2017   INSULIN 11.8 08/08/2017   CBC    Component Value Date/Time   WBC 12.1 (H) 08/24/2017 0257   RBC 3.93 (L) 08/24/2017 0257   HGB 11.0 (L) 08/24/2017 0257   HCT 33.6 (L) 08/24/2017 0257   PLT 204 08/24/2017 0257   MCV 85.5 08/24/2017 0257   MCH 28.0 08/24/2017 0257   MCHC 32.7 08/24/2017 0257   RDW 14.8 08/24/2017 0257   LYMPHSABS 1.7 07/15/2017 0828   MONOABS 0.4 07/15/2017 0828   EOSABS 0.2 07/15/2017 0828   BASOSABS 0.0 07/15/2017 0828   Iron/TIBC/Ferritin/ %Sat No results found for: IRON, TIBC, FERRITIN, IRONPCTSAT Lipid Panel     Component Value Date/Time   CHOL 175 03/04/2018 0803   TRIG 343 (H) 03/04/2018 0803   HDL 39 (L) 03/04/2018 0803   CHOLHDL 3.8 10/04/2017 0802   CHOLHDL 4 07/15/2017 0828   VLDL 41.0 (H) 07/15/2017 0828   LDLCALC 67 03/04/2018 0803   LDLDIRECT 105.0 07/15/2017 0828   Hepatic Function Panel     Component Value Date/Time   PROT 7.0 03/04/2018 0803   ALBUMIN 4.5 03/04/2018 0803   AST 17 03/04/2018 0803   ALT 29 03/04/2018 0803   ALKPHOS 61 03/04/2018 0803   BILITOT 0.4 03/04/2018 0803   BILIDIR 0.11 10/04/2017 0802      Component Value Date/Time   TSH 2.080 08/08/2017 1016   TSH 2.14 07/15/2017 0828   TSH 1.62 07/08/2015 1405   Results for  Banos, Amedio A "PAT" (MRN [id number]) as of 03/05/2018 09:34  Ref. Range 03/04/2018 08:03  Vitamin D, 25-Hydroxy Latest Ref Range: 30.0 - 100.0 ng/mL 26.3 (L)   ASSESSMENT AND PLAN: Vitamin D deficiency - Plan: VITAMIN D 25 Hydroxy (Vit-D Deficiency, Fractures), Vitamin D, Ergocalciferol, (DRISDOL) 1.25 MG (50000 UT) CAPS capsule  Other hyperlipidemia - Plan: Lipid Panel With LDL/HDL Ratio  Insulin resistance - Plan: Comprehensive metabolic panel, Hemoglobin A1c, Insulin, random  At risk for osteoporosis  Class 1 obesity with serious comorbidity and body mass index (BMI) of 30.0 to 30.9 in adult, unspecified obesity type - Starting BMI greater then 30  PLAN:  Vitamin D Deficiency Dewaun was informed that low vitamin D levels contributes to fatigue and are associated with obesity, breast, and colon cancer. He agrees to continue to take prescription Vit D @50 ,000 IU every week #4 with no refills and will follow up for routine testing of vitamin D, at least 2-3 times per year. He was informed of the risk of over-replacement of vitamin D and agrees to not increase his dose unless he discusses this with [name] first. We will check labs today and he agrees to follow up in 4 weeks.  At risk for osteopenia and osteoporosis Victor was given extended (15 minutes) osteoporosis prevention counseling today. Shane Wood is at risk for osteopenia and osteoporosis due to his vitamin D deficiency. He was encouraged to take his vitamin D and follow his higher calcium diet and increase strengthening exercise to help strengthen his bones and decrease his risk of osteopenia and osteoporosis.  Hyperlipidemia Shane Wood was informed of the American Heart Association Guidelines emphasizing intensive lifestyle modifications as the first line treatment for hyperlipidemia. We discussed many lifestyle modifications today in depth, and Shane Wood will  continue to work on decreasing saturated fats such as fatty red meat, butter and  many fried foods. He will also increase vegetables and lean protein in his diet and continue to work on exercise and weight loss efforts. We will check labs today and he will follow up at his next visit in 4 weeks.  Insulin Resistance Shane Wood will continue to work on weight loss, exercise, and decreasing simple carbohydrates in his diet to help decrease the risk of diabetes. He was informed that eating too many simple carbohydrates or too many calories at one sitting increases the likelihood of GI side effects. Shane Wood agrees to continue working on his diet and exercise and we will check labs today. Shane Wood agreed to follow up with [name] as directed to monitor his progress.  Obesity Shane Wood is currently in the action stage of change. As such, his goal is to continue with weight loss efforts. He has agreed to keep a food journal with 1500 to 1700 calories and 100 grams of protein. Shane Wood has been instructed to work up to a goal of 150 minutes of combined cardio and strengthening exercise per week for weight loss and overall health benefits. We discussed the following Behavioral Modification Strategies today: increasing lean protein intake, work on meal planning and easy cooking plans, travel eating strategies, and holiday eating strategies.   Shane Wood has agreed to follow up with our clinic in 4 weeks. He was informed of the importance of frequent follow up visits to maximize his success with intensive lifestyle modifications for his multiple health conditions.   OBESITY BEHAVIORAL INTERVENTION VISIT  Today's visit was # 9   Starting weight: 216 lbs Starting date: 08/08/17 Today's weight : Weight: 210 lb (95.3 kg)  Today's date: 03/04/2018 Total lbs lost to date: 6  ASK: We discussed the diagnosis of obesity with Shane Wood today and Shane Wood agreed to give [name] permission to discuss obesity behavioral modification therapy today.  ASSESS: Shane Wood has the diagnosis of obesity and his BMI  today is 29.3. Demonte is in the action stage of change.   ADVISE: Yutaka was educated on the multiple health risks of obesity as well as the benefit of weight loss to improve his health. He was advised of the need for long term treatment and the importance of lifestyle modifications to improve his current health and to decrease his risk of future health problems.  AGREE: Multiple dietary modification options and treatment options were discussed and Shane Wood agreed to follow the recommendations documented in the above note.  ARRANGE: Shane Wood was educated on the importance of frequent visits to treat obesity as outlined per CMS and USPSTF guidelines and agreed to schedule his next follow up appointment today.  I, Marcille Blanco, am acting as transcriptionist for Starlyn Skeans, MD  I have reviewed the above documentation for accuracy and completeness, and I agree with the above. -Dennard Nip, MD

## 2018-03-25 ENCOUNTER — Ambulatory Visit: Payer: Self-pay

## 2018-03-25 DIAGNOSIS — R61 Generalized hyperhidrosis: Secondary | ICD-10-CM | POA: Diagnosis not present

## 2018-03-25 DIAGNOSIS — M549 Dorsalgia, unspecified: Secondary | ICD-10-CM | POA: Diagnosis not present

## 2018-03-25 DIAGNOSIS — R51 Headache: Secondary | ICD-10-CM | POA: Diagnosis not present

## 2018-03-25 DIAGNOSIS — R6883 Chills (without fever): Secondary | ICD-10-CM | POA: Diagnosis not present

## 2018-03-25 NOTE — Telephone Encounter (Signed)
Patient called in with c/o "fatigue, chills, body aches." He says "yesterday evening I started with the chills and body aches. During the night, I woke up in a sweat. I don't know if I have the flu or something else. I want to be cautious, since I had heart stents placed a few months ago." I asked about fever, he says "I don't have a thermometer at home and I'm at work right now." I asked if he took any medication, he says "Extra-Strength Tylenol last night." I asked about other symptoms, he says "just body aches, chills and feeling tired." According to protocol, see PCP within 24 hours. Patient wanted to be seen today, but I advised no availability today. He says "well, if it's the flu, don't I have a certain time frame?" I advised that it's only been 24 hours of symptoms and that if it is the flu, tomorrow if he's swabbed it will still show up and treatment can be started." I advised to go to an UC or another [facility] practice if he didn't want to wait until tomorrow, he says he will come tomorrow to the office. Appointment scheduled for tomorrow at 1540 with Dr. Larose Kells, care advice given, patient verbalized understanding.   Reason for Disposition . [1] MODERATE weakness (i.e., interferes with work, school, normal activities) AND [2] persists > 3 days . [1] Fever > 100.0 F (37.8 C) AND [2] foreign travel to a developing country in the last month  Answer Assessment - Initial Assessment Questions 1. DESCRIPTION: "Describe how you are feeling."     Tired feeling, body achy 2. SEVERITY: "How bad is it?"  "Can you stand and walk?"   - MILD - Feels weak or tired, but does not interfere with work, school or normal activities   - [place] to stand and walk; weakness interferes with work, school, or normal activities   - SEVERE - Unable to stand or walk     Mild 3. ONSET:  "When did the weakness begin?"     Yesterday afternoon 4. CAUSE: "What do you think is causing the weakness?"     Maybe flu like  symptoms 5. MEDICINES: "Have you recently started a new medicine or had a change in the amount of a medicine?"     No 6. OTHER SYMPTOMS: "Do you have any other symptoms?" (e.g., chest pain, fever, cough, SOB, vomiting, diarrhea, bleeding, other areas of pain)     Chills, generalized body aches, sweating 7. PREGNANCY: "Is there any chance you are pregnant?" "When was your last menstrual period?"     N/A  Answer Assessment - Initial Assessment Questions 1. TEMPERATURE: "What is the most recent temperature?"  "How was it measured?"      Not checked, unable to check  2. ONSET: "When did the fever start?"      Yesterday afternoon 3. SYMPTOMS: "Do you have any other symptoms besides the fever?"  (e.g., colds, headache, sore throat, earache, cough, rash, diarrhea, vomiting, abdominal pain)     Body aches, chills 4. CAUSE: If there are no symptoms, ask: "What do you think is causing the fever?"      I don't know 5. CONTACTS: "Does anyone else in the family have an infection?"     Not that I know of 6. TREATMENT: "What have you done so far to treat this fever?" (e.g., medications)     ES Tylenol last night 7. IMMUNOCOMPROMISE: "Do you have of the following: diabetes, HIV positive, splenectomy,  cancer chemotherapy, chronic steroid treatment, transplant patient, etc."     No 8. PREGNANCY: "Is there any chance you are pregnant?" "When was your last menstrual period?"     N/A 9. TRAVEL: "Have you traveled out of the country in the last month?" (e.g., travel history, exposures)    End of October came back from [place]  Protocols used: [place] (GENERALIZED) AND FATIGUE-A-AH, FEVER-A-AH

## 2018-03-26 ENCOUNTER — Ambulatory Visit: Payer: BLUE CROSS/BLUE SHIELD | Admitting: Internal Medicine

## 2018-03-28 ENCOUNTER — Other Ambulatory Visit (INDEPENDENT_AMBULATORY_CARE_PROVIDER_SITE_OTHER): Payer: Self-pay | Admitting: Family Medicine

## 2018-03-28 DIAGNOSIS — E559 Vitamin D deficiency, unspecified: Secondary | ICD-10-CM

## 2018-04-01 ENCOUNTER — Ambulatory Visit (INDEPENDENT_AMBULATORY_CARE_PROVIDER_SITE_OTHER): Payer: BLUE CROSS/BLUE SHIELD | Admitting: Family Medicine

## 2018-04-07 ENCOUNTER — Ambulatory Visit (INDEPENDENT_AMBULATORY_CARE_PROVIDER_SITE_OTHER): Payer: BLUE CROSS/BLUE SHIELD | Admitting: Family Medicine

## 2018-04-07 ENCOUNTER — Encounter (INDEPENDENT_AMBULATORY_CARE_PROVIDER_SITE_OTHER): Payer: Self-pay | Admitting: Family Medicine

## 2018-04-07 VITALS — BP 112/70 | HR 67 | Temp 98.2°F | Ht 71.0 in | Wt 210.0 lb

## 2018-04-07 DIAGNOSIS — E669 Obesity, unspecified: Secondary | ICD-10-CM | POA: Diagnosis not present

## 2018-04-07 DIAGNOSIS — Z6830 Body mass index (BMI) 30.0-30.9, adult: Secondary | ICD-10-CM

## 2018-04-07 DIAGNOSIS — Z9189 Other specified personal risk factors, not elsewhere classified: Secondary | ICD-10-CM | POA: Diagnosis not present

## 2018-04-07 DIAGNOSIS — E559 Vitamin D deficiency, unspecified: Secondary | ICD-10-CM | POA: Diagnosis not present

## 2018-04-07 DIAGNOSIS — E781 Pure hyperglyceridemia: Secondary | ICD-10-CM | POA: Diagnosis not present

## 2018-04-07 DIAGNOSIS — E66811 Obesity, class 1: Secondary | ICD-10-CM

## 2018-04-07 MED ORDER — VITAMIN D (ERGOCALCIFEROL) 1.25 MG (50000 UNIT) PO CAPS: 50000.0000 [IU] | ORAL_CAPSULE | ORAL | 0 refills | Status: DC

## 2018-04-08 NOTE — Progress Notes (Signed)
Office: [phone]  /  Fax: [phone]   HPI:   Chief Complaint: OBESITY Shane Wood is here to discuss his progress with his obesity treatment plan. He is keeping a food journal with 1500 to 1700 calories and 100 grams of  protein and is following his eating plan approximately 10 to 15 % of the time. He states he is riding his Peloton bike and the gym 20 to 30 minutes 3 to 4 times per week. Shane Wood has done well maintaining weight over Thanksgiving and in to December. His goal is to maintain over Christmas and then try to lose another 10 pounds in 2020.  His weight is 210 lb (95.3 kg) today and has not lost weight since his last visit. He has lost 6 lbs since starting treatment with [name].  Vitamin D deficiency Shane Wood has a diagnosis of vitamin D deficiency. He is currently taking vit D and he is not at goal. His level is staying to low this winter. He denies nausea, vomiting, or muscle weakness.  At risk for osteopenia and osteoporosis Shane Wood is at higher risk of osteopenia and osteoporosis due to vitamin D deficiency.   Hypertriglyceridemia Shane Wood's triglycerides greatly increased. He states that he was fasting and he denies abdominal pain or jaundice.  ASSESSMENT AND PLAN:  Vitamin D deficiency - Plan: Vitamin D, Ergocalciferol, (DRISDOL) 1.25 MG (50000 UT) CAPS capsule  Hypertriglyceridemia  At risk for osteoporosis  Class 1 obesity with serious comorbidity and body mass index (BMI) of 30.0 to 30.9 in adult, unspecified obesity type - Starting BMI greater then 30  PLAN:  Vitamin D Deficiency Shane Wood was informed that low vitamin D levels contributes to fatigue and are associated with obesity, breast, and colon cancer. He agrees to continue to take prescription Vit D @50 ,000 IU every week #4 with no refills and will follow up for routine testing of vitamin D, at least 2-3 times per year. He was informed of the risk of over-replacement of vitamin D and agrees to not increase  his dose unless he discusses this with [name] first. Shane Wood agrees to follow up in 4 weeks.  At risk for osteopenia and osteoporosis Shane Wood was given extended  (15 minutes) osteoporosis prevention counseling today. Shane Wood is at risk for osteopenia and osteoporosis due to his vitamin D deficiency. He was encouraged to take his vitamin D and follow his higher calcium diet and increase strengthening exercise to help strengthen his bones and decrease his risk of osteopenia and osteoporosis.  Hypertriglyceridemia Shane Wood agrees to continue with his diet and exercise. We will recheck labs in 1 month and he agrees to follow up as directed.  Obesity Shane Wood is currently in the action stage of change. As such, his goal is to maintain weight over Christmas. He has agreed to portion control better and make smarter food choices, such as increase vegetables and decrease simple carbohydrates.  Shane Wood has been instructed to work up to a goal of 150 minutes of combined cardio and strengthening exercise per week for weight loss and overall health benefits. We discussed the following Behavioral Modification Strategies today: increasing lean protein intake, decreasing simple carbohydrates, and work on meal planning and easy cooking plans.  Shane Wood has agreed to follow up with our clinic in 4 weeks. He was informed of the importance of frequent follow up visits to maximize his success with intensive lifestyle modifications for his multiple health conditions.   ALLERGIES: No Known Allergies  MEDICATIONS: Current Outpatient Medications on File Prior to Visit  Medication Sig Dispense Refill  . Ascorbic Acid (VITAMIN C) 1000 MG tablet Take 1,000 mg by mouth daily.    [name] aspirin EC 81 MG EC tablet Take 1 tablet (81 mg total) by mouth daily. 30 tablet 11  . atorvastatin (LIPITOR) 80 MG tablet Take 1 tablet (80 mg total) by mouth daily at 6 PM. 30 tablet 11  . Cyanocobalamin (VITAMIN B 12 PO) Take 1 tablet by mouth  daily.    [name] ketorolac (ACULAR) 0.5 % ophthalmic solution Place 0.5 drops into both eyes as needed.    . metoprolol tartrate (LOPRESSOR) 25 MG tablet Take 0.5 tablets (12.5 mg total) by mouth 2 (two) times daily. 60 tablet 11  . Multiple Vitamin (MULTIVITAMIN) tablet Take 1 tablet by mouth daily.      . nitroGLYCERIN (NITROSTAT) 0.4 MG SL tablet Place 1 tablet (0.4 mg total) under the tongue every 5 (five) minutes x 3 doses as needed for chest pain. 25 tablet 11  . omeprazole (PRILOSEC) 20 MG capsule Take 20 mg by mouth daily.    . ticagrelor (BRILINTA) 90 MG TABS tablet Take 1 tablet (90 mg total) by mouth 2 (two) times daily. 60 tablet 0   No current facility-administered medications on file prior to visit.     PAST MEDICAL HISTORY: Past Medical History:  Diagnosis Date  . Allergy   . BACK PAIN   . ELEVATED BP READING WITHOUT DX HYPERTENSION 05/03/2008   Qualifier: Diagnosis of  By: Larose Kells MD, [place] GERD (gastroesophageal reflux disease)   . H/O cardiovascular stress test 2005    (-)  . Hyperlipidemia   . INSOMNIA-SLEEP DISORDER-UNSPEC   . PVC (premature ventricular contraction)     PAST SURGICAL HISTORY: Past Surgical History:  Procedure Laterality Date  . ARTERY REPAIR Right 08/23/2017   Procedure: EXPLORATION RIGHT RADIAL ARTERY,  RIGHT FOREARM FASCIOTOMY;  Surgeon: Elam Dutch, MD;  Location: [facility] OR;  Service: Vascular;  Laterality: Right;  . CHOLECYSTECTOMY    . CORONARY STENT INTERVENTION N/A 08/23/2017   Procedure: CORONARY STENT INTERVENTION;  Surgeon: Jettie Booze, MD;  Location: [place] [facility];  Service: Cardiovascular;  Laterality: N/A;  . LEFT HEART CATH AND CORONARY ANGIOGRAPHY N/A 08/23/2017   Procedure: LEFT HEART CATH AND CORONARY ANGIOGRAPHY;  Surgeon: Jettie Booze, MD;  Location: [place] [facility];  Service: Cardiovascular;  Laterality: N/A;  . POLYPECTOMY    . TONSILLECTOMY     age 62 y/o    SOCIAL HISTORY: Social History   Tobacco  Use  . Smoking status: Former Smoker    Last attempt to quit: 09/24/1988    Years since quitting: 29.5  . Smokeless tobacco: Former Systems developer    Types: Chew    Quit date: 1970  Substance Use Topics  . Alcohol use: Yes    Alcohol/week: 4.0 standard drinks    Types: 4 Glasses of wine per week    Comment: occasionally on weekends  . Drug use: No    FAMILY HISTORY: Family History  Problem Relation Age of Onset  . Lung cancer Father        smoker  . Diabetes Maternal Grandmother   . Diabetes Maternal Grandfather   . Hypertension Mother   . Depression Mother   . Obesity Mother   . Heart attack Other        uncle MI at age 59s  . Stroke Other        GM, in her  60  . Colon cancer Neg Hx   . Prostate cancer Neg Hx   . Colon polyps Neg Hx   . Esophageal cancer Neg Hx   . Rectal cancer Neg Hx   . Stomach cancer Neg Hx   . Pancreatic cancer Neg Hx    ROS: Review of Systems  Gastrointestinal: Negative for abdominal pain, nausea and vomiting.       Negative for jaundice.  Musculoskeletal:       Negative for muscle weakness.   PHYSICAL EXAM: Blood pressure 112/70, pulse 67, temperature 98.2 F (36.8 C), temperature source Oral, height 5\' 11"  (1.803 m), weight 210 lb (95.3 kg), SpO2 96 %. Body mass index is 29.29 kg/m. Physical Exam Vitals signs reviewed.  Constitutional:      Appearance: Normal appearance. He is obese.  Cardiovascular:     Rate and Rhythm: Normal rate.  Pulmonary:     Effort: Pulmonary effort is normal.  Musculoskeletal: Normal range of motion.  Skin:    General: Skin is warm and dry.  Neurological:     Mental Status: He is alert and oriented to person, place, and time.  Psychiatric:        Mood and Affect: Mood normal.        Behavior: Behavior normal.    RECENT LABS AND TESTS: BMET    Component Value Date/Time   NA 141 03/04/2018 0803   K 4.5 03/04/2018 0803   CL 103 03/04/2018 0803   CO2 20 03/04/2018 0803   GLUCOSE 91 03/04/2018 0803    GLUCOSE 149 (H) 08/24/2017 0257   BUN 25 03/04/2018 0803   CREATININE 1.13 03/04/2018 0803   CALCIUM 9.4 03/04/2018 0803   GFRNONAA 69 03/04/2018 0803   GFRAA 80 03/04/2018 0803   Lab Results  Component Value Date   HGBA1C 5.7 (H) 03/04/2018   HGBA1C 5.5 11/26/2017   HGBA1C 5.7 (H) 08/08/2017   Lab Results  Component Value Date   INSULIN 17.8 03/04/2018   INSULIN 8.7 11/26/2017   INSULIN 11.8 08/08/2017   CBC    Component Value Date/Time   WBC 12.1 (H) 08/24/2017 0257   RBC 3.93 (L) 08/24/2017 0257   HGB 11.0 (L) 08/24/2017 0257   HCT 33.6 (L) 08/24/2017 0257   PLT 204 08/24/2017 0257   MCV 85.5 08/24/2017 0257   MCH 28.0 08/24/2017 0257   MCHC 32.7 08/24/2017 0257   RDW 14.8 08/24/2017 0257   LYMPHSABS 1.7 07/15/2017 0828   MONOABS 0.4 07/15/2017 0828   EOSABS 0.2 07/15/2017 0828   BASOSABS 0.0 07/15/2017 0828   Iron/TIBC/Ferritin/ %Sat No results found for: IRON, TIBC, FERRITIN, IRONPCTSAT Lipid Panel     Component Value Date/Time   CHOL 175 03/04/2018 0803   TRIG 343 (H) 03/04/2018 0803   HDL 39 (L) 03/04/2018 0803   CHOLHDL 3.8 10/04/2017 0802   CHOLHDL 4 07/15/2017 0828   VLDL 41.0 (H) 07/15/2017 0828   LDLCALC 67 03/04/2018 0803   LDLDIRECT 105.0 07/15/2017 0828   Hepatic Function Panel     Component Value Date/Time   PROT 7.0 03/04/2018 0803   ALBUMIN 4.5 03/04/2018 0803   AST 17 03/04/2018 0803   ALT 29 03/04/2018 0803   ALKPHOS 61 03/04/2018 0803   BILITOT 0.4 03/04/2018 0803   BILIDIR 0.11 10/04/2017 0802      Component Value Date/Time   TSH 2.080 08/08/2017 1016   TSH 2.14 07/15/2017 0828   TSH 1.62 07/08/2015 1405   Results for Santillana, Argus  A "PAT" (MRN [id number]) as of 04/08/2018 15:52  Ref. Range 03/04/2018 08:03  Vitamin D, 25-Hydroxy Latest Ref Range: 30.0 - 100.0 ng/mL 26.3 (L)    OBESITY BEHAVIORAL INTERVENTION VISIT  Today's visit was # 10   Starting weight: 216 lbs Starting date: 08/08/17 Today's weight : Weight: 210  lb (95.3 kg)  Today's date: 04/07/2018 Total lbs lost to date: 6  ASK: We discussed the diagnosis of obesity with [name] today and Escher agreed to give [name] permission to discuss obesity behavioral modification therapy today.  ASSESS: Thadd has the diagnosis of obesity and his BMI today is 29.3. [place]ay is in the action stage of change.   ADVISE: Avelino was educated on the multiple health risks of obesity as well as the benefit of weight loss to improve his health. He was advised of the need for long term treatment and the importance of lifestyle modifications to improve his current health and to decrease his risk of future health problems.  AGREE: Multiple dietary modification options and treatment options were discussed and Hadi agreed to follow the recommendations documented in the above note.  ARRANGE: Frederic was educated on the importance of frequent visits to treat obesity as outlined per CMS and USPSTF guidelines and agreed to schedule his next follow up appointment today.  I, Marcille Blanco, am acting as transcriptionist for Starlyn Skeans, MD  I have reviewed the above documentation for accuracy and completeness, and I agree with the above. -Dennard Nip, MD

## 2018-05-01 ENCOUNTER — Other Ambulatory Visit (INDEPENDENT_AMBULATORY_CARE_PROVIDER_SITE_OTHER): Payer: Self-pay | Admitting: Family Medicine

## 2018-05-01 DIAGNOSIS — E559 Vitamin D deficiency, unspecified: Secondary | ICD-10-CM

## 2018-05-05 NOTE — Progress Notes (Signed)
Cardiology Office Note   Date:  05/06/2018   ID:  Shane Wood, DOB 1955/10/30, MRN [id number]  PCP:  Colon Branch, MD    No chief complaint on file.  CAD  Wt Readings from Last 3 Encounters:  05/06/18 221 lb 12.8 oz (100.6 kg)  04/07/18 210 lb (95.3 kg)  03/04/18 210 lb (95.3 kg)       History of Present Illness: Shane Wood is a 63 y.o. 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 He was admitted to [facility] with NSTEMI 08/23/2017 and underwent cardiac catheterization with DES to the proximal to mid RCA and ostial PDA, residual 50% proximal LAD, 80% circumflex, total OM1 normal LVEF 50 to 55% with mild aortic stenosis.Patient developed compartment syndrome of the right forearm post procedure and underwent exploration of the right forearm. Radial artery was identified and intact median nerve and ulnar neurovascular bundle intact. Wound was irrigated and loosely closed. Follow-up with vascular surgery of as well as Dr. Burney Wood showed that arm had healed well.Also has history of PVCs and HLD.  He thinks he had a lot of stress in the months before the MI. MI sx were pressure and a deep ache.   Immediately after discharge, he had some chest twinges.  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  Went to [place] for vacation.  He was active.  Walking, fishing etc...  Did well.  Denies :  Dizziness. Leg edema. Nitroglycerin use. Orthopnea. Palpitations. Paroxysmal nocturnal dyspnea. Shortness of breath. Syncope.   Occasional mild chest pain.  He pays more attention to sx now since the MI.  COntinues to have no problems when he exerts himself.       Past Medical History:  Diagnosis Date  . Allergy   . BACK PAIN   . ELEVATED BP READING WITHOUT DX HYPERTENSION 05/03/2008   Qualifier: Diagnosis of  By: Larose Kells MD, [place] GERD (gastroesophageal reflux disease)   . H/O cardiovascular stress  test 2005    (-)  . Hyperlipidemia   . INSOMNIA-SLEEP DISORDER-UNSPEC   . PVC (premature ventricular contraction)     Past Surgical History:  Procedure Laterality Date  . ARTERY REPAIR Right 08/23/2017   Procedure: EXPLORATION RIGHT RADIAL ARTERY,  RIGHT FOREARM FASCIOTOMY;  Surgeon: Elam Dutch, MD;  Location: [facility] OR;  Service: Vascular;  Laterality: Right;  . CHOLECYSTECTOMY    . CORONARY STENT INTERVENTION N/A 08/23/2017   Procedure: CORONARY STENT INTERVENTION;  Surgeon: Jettie Booze, MD;  Location: [place] [facility];  Service: Cardiovascular;  Laterality: N/A;  . LEFT HEART CATH AND CORONARY ANGIOGRAPHY N/A 08/23/2017   Procedure: LEFT HEART CATH AND CORONARY ANGIOGRAPHY;  Surgeon: Jettie Booze, MD;  Location: [place] [facility];  Service: Cardiovascular;  Laterality: N/A;  . POLYPECTOMY    . TONSILLECTOMY     age 73 y/o     Current Outpatient Medications  Medication Sig Dispense Refill  . Ascorbic Acid (VITAMIN C) 1000 MG tablet Take 1,000 mg by mouth daily.    [name] aspirin EC 81 MG EC tablet Take 1 tablet (81 mg total) by mouth daily. 30 tablet 11  . atorvastatin (LIPITOR) 80 MG tablet Take 1 tablet (80 mg total) by mouth daily at 6 PM. 30 tablet 11  . Cyanocobalamin (VITAMIN B 12 PO) Take 1 tablet by mouth daily.    [name]  metoprolol tartrate (LOPRESSOR) 25 MG tablet Take 0.5 tablets (12.5 mg total) by mouth 2 (two) times daily. 60 tablet 11  . Multiple Vitamin (MULTIVITAMIN) tablet Take 1 tablet by mouth daily.      . nitroGLYCERIN (NITROSTAT) 0.4 MG SL tablet Place 1 tablet (0.4 mg total) under the tongue every 5 (five) minutes x 3 doses as needed for chest pain. 25 tablet 11  . omeprazole (PRILOSEC) 20 MG capsule Take 20 mg by mouth daily.    . ticagrelor (BRILINTA) 90 MG TABS tablet Take 1 tablet (90 mg total) by mouth 2 (two) times daily. 60 tablet 0  . Vitamin D, Ergocalciferol, (DRISDOL) 1.25 MG (50000 UT) CAPS capsule Take 1 capsule (50,000 Units total) by  mouth every 7 (seven) days. 4 capsule 0   No current facility-administered medications for this visit.     Allergies:   Patient has no known allergies.    Social History:  The patient  reports that he quit smoking about 29 years ago. He quit smokeless tobacco use about 50 years ago.  His smokeless tobacco use included chew. He reports current alcohol use of about 4.0 standard drinks of alcohol per week. He reports that he does not use drugs.   Family History:  The patient's family history includes Depression in his mother; Diabetes in his maternal grandfather and maternal grandmother; Heart attack in an other family member; Hypertension in his mother; Lung cancer in his father; Obesity in his mother; Stroke in an other family member.    ROS:  Please see the history of present illness.   Otherwise, review of systems are positive for occasional chest twinges.   All other systems are reviewed and negative.    PHYSICAL EXAM: VS:  BP (!) 150/84   Pulse 76   Ht 5\' 11"  (1.803 m)   Wt 221 lb 12.8 oz (100.6 kg)   SpO2 98%   BMI 30.93 kg/m  , BMI Body mass index is 30.93 kg/m. GEN: Well nourished, well developed, in no acute distress  HEENT: normal  Neck: no JVD, carotid bruits, or masses Cardiac: RRR; no murmurs, rubs, or gallops,no edema , 2+ right radial pulse Respiratory:  clear to auscultation bilaterally, normal work of breathing GI: soft, nontender, nondistended, + BS MS: no deformity or atrophy  Skin: warm and dry, no rash Neuro:  Strength and sensation are intact Psych: euthymic mood, full affect    Recent Labs: 08/08/2017: TSH 2.080 08/24/2017: Hemoglobin 11.0; Platelets 204 03/04/2018: ALT 29; BUN 25; Creatinine, Ser 1.13; Potassium 4.5; Sodium 141   Lipid Panel    Component Value Date/Time   CHOL 175 03/04/2018 0803   TRIG 343 (H) 03/04/2018 0803   HDL 39 (L) 03/04/2018 0803   CHOLHDL 3.8 10/04/2017 0802   CHOLHDL 4 07/15/2017 0828   VLDL 41.0 (H) 07/15/2017 0828    LDLCALC 67 03/04/2018 0803   LDLDIRECT 105.0 07/15/2017 0828     Other studies Reviewed: Additional studies/ records that were reviewed today with results demonstrating: Labs revoewed.  Multiple stents to the RCA.   ASSESSMENT AND PLAN:  1. CAD/Old MI: Angina controlled on medical therapy.  Known occlusion of small OM's.  He has some atypical chest pains.  Will likely change from Brilinta to Plavix in June 2020.   2. Hyperlipidemia: LDL 67,  TG over 300 in 11/19.  Would consider Vascepa if TG are still elevated in March 2020.  3. PVC: Not feeling them.    4. Watch BP.  Elevated today but better readings at other offices.  Minimize salt intake.    Current medicines are reviewed at length with the patient today.  The patient concerns regarding his medicines were addressed.  The following changes have been made:  No change  Labs/ tests ordered today include:  No orders of the defined types were placed in this encounter.   Recommend 150 minutes/week of aerobic exercise Low fat, low carb, high fiber diet recommended  Disposition:   FU in 6 months   Signed, Larae Grooms, MD  05/06/2018 3:01 PM    [facility] [address], [place], [place]  [address] Phone: [phone]; Fax: [phone]

## 2018-05-06 ENCOUNTER — Ambulatory Visit: Payer: BLUE CROSS/BLUE SHIELD | Admitting: Interventional Cardiology

## 2018-05-06 ENCOUNTER — Encounter: Payer: Self-pay | Admitting: Interventional Cardiology

## 2018-05-06 VITALS — BP 150/84 | HR 76 | Ht 71.0 in | Wt 221.8 lb

## 2018-05-06 DIAGNOSIS — I25118 Atherosclerotic heart disease of native coronary artery with other forms of angina pectoris: Secondary | ICD-10-CM | POA: Diagnosis not present

## 2018-05-06 DIAGNOSIS — E782 Mixed hyperlipidemia: Secondary | ICD-10-CM

## 2018-05-06 DIAGNOSIS — I493 Ventricular premature depolarization: Secondary | ICD-10-CM

## 2018-05-06 DIAGNOSIS — I252 Old myocardial infarction: Secondary | ICD-10-CM

## 2018-05-06 NOTE — Patient Instructions (Signed)

## 2018-05-07 ENCOUNTER — Encounter (INDEPENDENT_AMBULATORY_CARE_PROVIDER_SITE_OTHER): Payer: Self-pay | Admitting: Family Medicine

## 2018-05-07 ENCOUNTER — Ambulatory Visit (INDEPENDENT_AMBULATORY_CARE_PROVIDER_SITE_OTHER): Payer: BLUE CROSS/BLUE SHIELD | Admitting: Family Medicine

## 2018-05-07 VITALS — BP 116/72 | HR 49 | Temp 97.7°F | Ht 71.0 in | Wt 212.0 lb

## 2018-05-07 DIAGNOSIS — E559 Vitamin D deficiency, unspecified: Secondary | ICD-10-CM | POA: Diagnosis not present

## 2018-05-07 DIAGNOSIS — E669 Obesity, unspecified: Secondary | ICD-10-CM | POA: Diagnosis not present

## 2018-05-07 DIAGNOSIS — Z9189 Other specified personal risk factors, not elsewhere classified: Secondary | ICD-10-CM

## 2018-05-07 DIAGNOSIS — Z6830 Body mass index (BMI) 30.0-30.9, adult: Secondary | ICD-10-CM

## 2018-05-07 MED ORDER — VITAMIN D (ERGOCALCIFEROL) 1.25 MG (50000 UNIT) PO CAPS: 50000.0000 [IU] | ORAL_CAPSULE | ORAL | 0 refills | Status: DC

## 2018-05-07 NOTE — Progress Notes (Signed)
Office: [phone]  /  Fax: [phone]   HPI:   Chief Complaint: OBESITY Shane Wood is here to discuss his progress with his obesity treatment plan. He is keeping a food journal with 1400 to 1700 calories and 100+ grams of protein  and is following his eating plan approximately 80 to 90 % of the time. He states he is using Pel[employer], weights, and going to the gym 30 to 45 minutes 4 to 5 times per week. Shane Wood did well minimizing weight gain over the holidays. He is getting back to journaling more regularly. He is working on decreasing red meats and increasing vegetables.  His weight is 212 lb (96.2 kg) today and has had a weight gain of 2 pounds over a period of 4 weeks since his last visit. He has lost 4 lbs since starting treatment with [name].  Vitamin D deficiency Shane Wood has a diagnosis of vitamin D deficiency. He is currently stable on vit D, but is not yet at goal. He denies nausea, vomiting, or muscle weakness.  ASSESSMENT AND PLAN:  Vitamin D deficiency - Plan: Vitamin D, Ergocalciferol, (DRISDOL) 1.25 MG (50000 UT) CAPS capsule  At risk for osteoporosis  Class 1 obesity with serious comorbidity and body mass index (BMI) of 30.0 to 30.9 in adult, unspecified obesity type - Starting BMI greater then 30  PLAN:  Vitamin D Deficiency Shane Wood was informed that low vitamin D levels contributes to fatigue and are associated with obesity, breast, and colon cancer. He agrees to continue to take prescription Vit D @50 ,000 IU every week #4 with no refills and will follow up for routine testing of vitamin D, at least 2-3 times per year. He was informed of the risk of over-replacement of vitamin D and agrees to not increase his dose unless he discusses this with [name] first. Shane Wood agrees to follow up in weeks.  Obesity Shane Wood is currently in the action stage of change. As such, his goal is to continue with weight loss efforts. He has agreed to keep a food journal with 1400 to 1700 calories  and 100+ grams of protein.  Shane Wood has been instructed to work up to a goal of 150 minutes of combined cardio and strengthening exercise per week for weight loss and overall health benefits. We discussed the following Behavioral Modification Strategies today: increasing lean protein intake, planning for success, and keeping a strict food journal.  Shane Wood has agreed to follow up with our clinic in 4 weeks. He was informed of the importance of frequent follow up visits to maximize his success with intensive lifestyle modifications for his multiple health conditions.  ALLERGIES: No Known Allergies  MEDICATIONS: Current Outpatient Medications on File Prior to Visit  Medication Sig Dispense Refill  . Ascorbic Acid (VITAMIN C) 1000 MG tablet Take 1,000 mg by mouth daily.    [name] aspirin EC 81 MG EC tablet Take 1 tablet (81 mg total) by mouth daily. 30 tablet 11  . atorvastatin (LIPITOR) 80 MG tablet Take 1 tablet (80 mg total) by mouth daily at 6 PM. 30 tablet 11  . Cyanocobalamin (VITAMIN B 12 PO) Take 1 tablet by mouth daily.    . metoprolol tartrate (LOPRESSOR) 25 MG tablet Take 0.5 tablets (12.5 mg total) by mouth 2 (two) times daily. 60 tablet 11  . Multiple Vitamin (MULTIVITAMIN) tablet Take 1 tablet by mouth daily.      . nitroGLYCERIN (NITROSTAT) 0.4 MG SL tablet Place 1 tablet (0.4 mg total) under the tongue every  5 (five) minutes x 3 doses as needed for chest pain. 25 tablet 11  . omeprazole (PRILOSEC) 20 MG capsule Take 20 mg by mouth daily.    . ticagrelor (BRILINTA) 90 MG TABS tablet Take 1 tablet (90 mg total) by mouth 2 (two) times daily. 60 tablet 0  . Vitamin D, Ergocalciferol, (DRISDOL) 1.25 MG (50000 UT) CAPS capsule Take 1 capsule (50,000 Units total) by mouth every 7 (seven) days. 4 capsule 0   No current facility-administered medications on file prior to visit.     PAST MEDICAL HISTORY: Past Medical History:  Diagnosis Date  . Allergy   . BACK PAIN   . ELEVATED BP  READING WITHOUT DX HYPERTENSION 05/03/2008   Qualifier: Diagnosis of  By: Larose Kells MD, [place] GERD (gastroesophageal reflux disease)   . H/O cardiovascular stress test 2005    (-)  . Hyperlipidemia   . INSOMNIA-SLEEP DISORDER-UNSPEC   . PVC (premature ventricular contraction)     PAST SURGICAL HISTORY: Past Surgical History:  Procedure Laterality Date  . ARTERY REPAIR Right 08/23/2017   Procedure: EXPLORATION RIGHT RADIAL ARTERY,  RIGHT FOREARM FASCIOTOMY;  Surgeon: Elam Dutch, MD;  Location: [facility];  Service: Vascular;  Laterality: Right;  . CHOLECYSTECTOMY    . CORONARY STENT INTERVENTION N/A 08/23/2017   Procedure: CORONARY STENT INTERVENTION;  Surgeon: Jettie Booze, MD;  Location: [place] [facility];  Service: Cardiovascular;  Laterality: N/A;  . LEFT HEART CATH AND CORONARY ANGIOGRAPHY N/A 08/23/2017   Procedure: LEFT HEART CATH AND CORONARY ANGIOGRAPHY;  Surgeon: Jettie Booze, MD;  Location: [place] [facility];  Service: Cardiovascular;  Laterality: N/A;  . POLYPECTOMY    . TONSILLECTOMY     age 63 y/o    SOCIAL HISTORY: Social History   Tobacco Use  . Smoking status: Former Smoker    Last attempt to quit: 09/24/1988    Years since quitting: 29.6  . Smokeless tobacco: Former Systems developer    Types: Chew    Quit date: 1970  Substance Use Topics  . Alcohol use: Yes    Alcohol/week: 4.0 standard drinks    Types: 4 Glasses of wine per week    Comment: occasionally on weekends  . Drug use: No    FAMILY HISTORY: Family History  Problem Relation Age of Onset  . Lung cancer Father        smoker  . Diabetes Maternal Grandmother   . Diabetes Maternal Grandfather   . Hypertension Mother   . Depression Mother   . Obesity Mother   . Heart attack Other        uncle MI at age 28s  . Stroke Other        GM, in her 13  . Colon cancer Neg Hx   . Prostate cancer Neg Hx   . Colon polyps Neg Hx   . Esophageal cancer Neg Hx   . Rectal cancer Neg Hx   . Stomach cancer  Neg Hx   . Pancreatic cancer Neg Hx     ROS: Review of Systems  Constitutional: Negative for weight loss.  Gastrointestinal: Negative for nausea and vomiting.  Musculoskeletal:       Negative for muscle weakness.    PHYSICAL EXAM: Blood pressure 116/72, pulse (!) 49, temperature 97.7 F (36.5 C), temperature source Oral, height 5\' 11"  (1.803 m), weight 212 lb (96.2 kg), SpO2 98 %. Body mass index is 29.57 kg/m. Physical Exam Vitals signs reviewed.  Constitutional:      Appearance: Normal appearance. He is obese.  Cardiovascular:     Rate and Rhythm: Normal rate.  Pulmonary:     Effort: Pulmonary effort is normal.  Musculoskeletal: Normal range of motion.  Skin:    General: Skin is warm and dry.  Neurological:     Mental Status: He is alert and oriented to person, place, and time.  Psychiatric:        Mood and Affect: Mood normal.        Behavior: Behavior normal.     RECENT LABS AND TESTS: BMET    Component Value Date/Time   NA 141 03/04/2018 0803   K 4.5 03/04/2018 0803   CL 103 03/04/2018 0803   CO2 20 03/04/2018 0803   GLUCOSE 91 03/04/2018 0803   GLUCOSE 149 (H) 08/24/2017 0257   BUN 25 03/04/2018 0803   CREATININE 1.13 03/04/2018 0803   CALCIUM 9.4 03/04/2018 0803   GFRNONAA 69 03/04/2018 0803   GFRAA 80 03/04/2018 0803   Lab Results  Component Value Date   HGBA1C 5.7 (H) 03/04/2018   HGBA1C 5.5 11/26/2017   HGBA1C 5.7 (H) 08/08/2017   Lab Results  Component Value Date   INSULIN 17.8 03/04/2018   INSULIN 8.7 11/26/2017   INSULIN 11.8 08/08/2017   CBC    Component Value Date/Time   WBC 12.1 (H) 08/24/2017 0257   RBC 3.93 (L) 08/24/2017 0257   HGB 11.0 (L) 08/24/2017 0257   HCT 33.6 (L) 08/24/2017 0257   PLT 204 08/24/2017 0257   MCV 85.5 08/24/2017 0257   MCH 28.0 08/24/2017 0257   MCHC 32.7 08/24/2017 0257   RDW 14.8 08/24/2017 0257   LYMPHSABS 1.7 07/15/2017 0828   MONOABS 0.4 07/15/2017 0828   EOSABS 0.2 07/15/2017 0828   BASOSABS  0.0 07/15/2017 0828   Iron/TIBC/Ferritin/ %Sat No results found for: IRON, TIBC, FERRITIN, IRONPCTSAT Lipid Panel     Component Value Date/Time   CHOL 175 03/04/2018 0803   TRIG 343 (H) 03/04/2018 0803   HDL 39 (L) 03/04/2018 0803   CHOLHDL 3.8 10/04/2017 0802   CHOLHDL 4 07/15/2017 0828   VLDL 41.0 (H) 07/15/2017 0828   LDLCALC 67 03/04/2018 0803   LDLDIRECT 105.0 07/15/2017 0828   Hepatic Function Panel     Component Value Date/Time   PROT 7.0 03/04/2018 0803   ALBUMIN 4.5 03/04/2018 0803   AST 17 03/04/2018 0803   ALT 29 03/04/2018 0803   ALKPHOS 61 03/04/2018 0803   BILITOT 0.4 03/04/2018 0803   BILIDIR 0.11 10/04/2017 0802      Component Value Date/Time   TSH 2.080 08/08/2017 1016   TSH 2.14 07/15/2017 0828   TSH 1.62 07/08/2015 1405   Results for Micucci, Shane A "PAT" (MRN [id number]) as of 05/07/2018 08:04  Ref. Range 03/04/2018 08:03  Vitamin D, 25-Hydroxy Latest Ref Range: 30.0 - 100.0 ng/mL 26.3 (L)    OBESITY BEHAVIORAL INTERVENTION VISIT  Today's visit was # 11   Starting weight: 216 lbs Starting date: 08/08/17 Today's weight : Weight: 212 lb (96.2 kg)  Today's date: 05/07/2018 Total lbs lost to date: 4  ASK: We discussed the diagnosis of obesity with [name] today and Neftaly agreed to give [name] permission to discuss obesity behavioral modification therapy today.  ASSESS: Cian has the diagnosis of obesity and his BMI today is 29.5. Riaz is in the action stage of change.   ADVISE: Koran was educated on the multiple health risks of  obesity as well as the benefit of weight loss to improve his health. He was advised of the need for long term treatment and the importance of lifestyle modifications to improve his current health and to decrease his risk of future health problems.  AGREE: Multiple dietary modification options and treatment options were discussed and Jaishon agreed to follow the recommendations documented in the above  note.  ARRANGE: Akim was educated on the importance of frequent visits to treat obesity as outlined per CMS and USPSTF guidelines and agreed to schedule his next follow up appointment today.  I, Marcille Blanco, am acting as transcriptionist for Starlyn Skeans, MD  I have reviewed the above documentation for accuracy and completeness, and I agree with the above. -Dennard Nip, MD

## 2018-05-28 ENCOUNTER — Other Ambulatory Visit (INDEPENDENT_AMBULATORY_CARE_PROVIDER_SITE_OTHER): Payer: Self-pay | Admitting: Family Medicine

## 2018-05-28 DIAGNOSIS — E559 Vitamin D deficiency, unspecified: Secondary | ICD-10-CM

## 2018-06-04 ENCOUNTER — Encounter (INDEPENDENT_AMBULATORY_CARE_PROVIDER_SITE_OTHER): Payer: Self-pay

## 2018-06-04 ENCOUNTER — Ambulatory Visit (INDEPENDENT_AMBULATORY_CARE_PROVIDER_SITE_OTHER): Payer: BLUE CROSS/BLUE SHIELD | Admitting: Family Medicine

## 2018-06-17 ENCOUNTER — Encounter (INDEPENDENT_AMBULATORY_CARE_PROVIDER_SITE_OTHER): Payer: Self-pay | Admitting: Family Medicine

## 2018-06-17 ENCOUNTER — Ambulatory Visit (INDEPENDENT_AMBULATORY_CARE_PROVIDER_SITE_OTHER): Payer: BLUE CROSS/BLUE SHIELD | Admitting: Family Medicine

## 2018-06-17 VITALS — BP 117/73 | HR 58 | Temp 97.8°F | Ht 71.0 in | Wt 214.0 lb

## 2018-06-17 DIAGNOSIS — Z9189 Other specified personal risk factors, not elsewhere classified: Secondary | ICD-10-CM

## 2018-06-17 DIAGNOSIS — E669 Obesity, unspecified: Secondary | ICD-10-CM

## 2018-06-17 DIAGNOSIS — Z6830 Body mass index (BMI) 30.0-30.9, adult: Secondary | ICD-10-CM

## 2018-06-17 DIAGNOSIS — E559 Vitamin D deficiency, unspecified: Secondary | ICD-10-CM | POA: Diagnosis not present

## 2018-06-17 DIAGNOSIS — R0602 Shortness of breath: Secondary | ICD-10-CM

## 2018-06-17 MED ORDER — VITAMIN D (ERGOCALCIFEROL) 1.25 MG (50000 UNIT) PO CAPS: 50000.0000 [IU] | ORAL_CAPSULE | ORAL | 0 refills | Status: DC

## 2018-06-17 NOTE — Progress Notes (Signed)
Office: [phone]  /  Fax: [phone]   HPI:   Chief Complaint: OBESITY Shane Wood is here to discuss his progress with his obesity treatment plan. He is keeping a food journal with 1400 to 1700 calories and 100 grams of protein and is following his eating plan approximately 50 % of the time. He states he is using the [place] 30 to 45 minutes 1 time per week. Shane Wood is currently struggling to keep a food journal and is gaining weight again. He feels that he is eating healthy and is frustrated that this is not enough to cause weight loss. He travels internationally for work a lot which makes journaling difficult.  His weight is 214 lb (97.1 kg) today and has had a weight gain of 2 pounds over a period of 6 weeks since her last visit. He has lost 2 lbs since starting treatment with [name].  Vitamin D deficiency Calyb has a diagnosis of vitamin D deficiency. He is currently stable on vit D and denies nausea, vomiting, or muscle weakness.  Dyspnea with Exercise Saralyn Pilar notes increasing shortness of breath with exercising and seems to be worsening over time with weight gain. He notes getting out of breath sooner with activity than he used to. This has not gotten worse recently. Broady notes that this has improved with exercise. He is status post myocardial infarction and is working on exercise and diet. He denies chest pain or shortness of breath at rest.  At risk for diabetes Korie is at higher than average risk for developing diabetes due to his shortness of breath and obesity. He currently denies polyuria or polydipsia.  ASSESSMENT AND PLAN:  Vitamin D deficiency - Plan: Vitamin D, Ergocalciferol, (DRISDOL) 1.25 MG (50000 UT) CAPS capsule  Shortness of breath on exertion  At risk for diabetes mellitus  Class 1 obesity with serious comorbidity and body mass index (BMI) of 30.0 to 30.9 in adult, unspecified obesity type - Starting BMI greater then 30  PLAN:  Vitamin D  Deficiency [name] was informed that low vitamin D levels contributes to fatigue and are associated with obesity, breast, and colon cancer. [name] agrees to continue to take prescription Vit D @50 ,000 IU every week #4 with no refills and will follow up for routine testing of vitamin D, at least 2-3 times per year. She was informed of the risk of over-replacement of vitamin D and agrees to not increase her dose unless she discusses this with [name] first. [name] agrees to follow up in 4 weeks as directed.  Dyspnea with exercise [name]'s shortness of breath appears to be obesity related and exercise induced. The indirect calorimeter results from today shows his RMR has slightly improved. His VO2 was 275 and a REE of 1916. He has agreed to work on weight loss and gradually increase exercise to treat his exercise induced shortness of breath. If Elder follows our instructions and loses weight without improvement of his shortness of breath, we will plan to refer to pulmonology. [name] agrees to this plan.  Diabetes risk counseling [name] was given extended (15 minutes) diabetes prevention counseling today. He is 63 y.o. male and has risk factors for diabetes including shortness of breath and obesity. We discussed intensive lifestyle modifications today with an emphasis on weight loss as well as increasing exercise and decreasing simple carbohydrates in his diet.  Obesity [name] is currently in the action stage of change. As such, his goal is to continue with weight loss efforts. He has agreed to keep  a food journal with 1400 to 1700 calories and 100 grams of protein.  [name] has been instructed to work up to a goal of 150 minutes of combined cardio and strengthening exercise per week for weight loss and overall health benefits. We discussed the following Behavioral Modification Strategies today: increasing lean protein intake, decreasing simple carbohydrates, and work on meal planning and easy cooking  plans. [name] was advised that he must be strict about journaling to see results. His goal is to get visceral fat below 12.  [name] has agreed to follow up with our clinic in 4 weeks. He was informed of the importance of frequent follow up visits to maximize his success with intensive lifestyle modifications for his multiple health conditions.  ALLERGIES: No Known Allergies  MEDICATIONS: Current Outpatient Medications on File Prior to Visit  Medication Sig Dispense Refill  . Ascorbic Acid (VITAMIN C) 1000 MG tablet Take 1,000 mg by mouth daily.    [name] aspirin EC 81 MG EC tablet Take 1 tablet (81 mg total) by mouth daily. 30 tablet 11  . atorvastatin (LIPITOR) 80 MG tablet Take 1 tablet (80 mg total) by mouth daily at 6 PM. 30 tablet 11  . Cyanocobalamin (VITAMIN B 12 PO) Take 1 tablet by mouth daily.    . metoprolol tartrate (LOPRESSOR) 25 MG tablet Take 0.5 tablets (12.5 mg total) by mouth 2 (two) times daily. 60 tablet 11  . Multiple Vitamin (MULTIVITAMIN) tablet Take 1 tablet by mouth daily.      . nitroGLYCERIN (NITROSTAT) 0.4 MG SL tablet Place 1 tablet (0.4 mg total) under the tongue every 5 (five) minutes x 3 doses as needed for chest pain. 25 tablet 11  . omeprazole (PRILOSEC) 20 MG capsule Take 20 mg by mouth daily.    . ticagrelor (BRILINTA) 90 MG TABS tablet Take 1 tablet (90 mg total) by mouth 2 (two) times daily. 60 tablet 0   No current facility-administered medications on file prior to visit.     PAST MEDICAL HISTORY: Past Medical History:  Diagnosis Date  . Allergy   . BACK PAIN   . ELEVATED BP READING WITHOUT DX HYPERTENSION 05/03/2008   Qualifier: Diagnosis of  By: Larose Kells MD, [place] GERD (gastroesophageal reflux disease)   . H/O cardiovascular stress test 2005    (-)  . Hyperlipidemia   . INSOMNIA-SLEEP DISORDER-UNSPEC   . PVC (premature ventricular contraction)     PAST SURGICAL HISTORY: Past Surgical History:  Procedure Laterality Date  . ARTERY REPAIR  Right 08/23/2017   Procedure: EXPLORATION RIGHT RADIAL ARTERY,  RIGHT FOREARM FASCIOTOMY;  Surgeon: Elam Dutch, MD;  Location: [facility];  Service: Vascular;  Laterality: Right;  . CHOLECYSTECTOMY    . CORONARY STENT INTERVENTION N/A 08/23/2017   Procedure: CORONARY STENT INTERVENTION;  Surgeon: Jettie Booze, MD;  Location: [place] [facility];  Service: Cardiovascular;  Laterality: N/A;  . LEFT HEART CATH AND CORONARY ANGIOGRAPHY N/A 08/23/2017   Procedure: LEFT HEART CATH AND CORONARY ANGIOGRAPHY;  Surgeon: Jettie Booze, MD;  Location: [place] [facility];  Service: Cardiovascular;  Laterality: N/A;  . POLYPECTOMY    . TONSILLECTOMY     age 18 y/o    SOCIAL HISTORY: Social History   Tobacco Use  . Smoking status: Former Smoker    Last attempt to quit: 09/24/1988    Years since quitting: 29.7  . Smokeless tobacco: Former Systems developer    Types: Chew    Quit date:  1970  Substance Use Topics  . Alcohol use: Yes    Alcohol/week: 4.0 standard drinks    Types: 4 Glasses of wine per week    Comment: occasionally on weekends  . Drug use: No    FAMILY HISTORY: Family History  Problem Relation Age of Onset  . Lung cancer Father        smoker  . Diabetes Maternal Grandmother   . Diabetes Maternal Grandfather   . Hypertension Mother   . Depression Mother   . Obesity Mother   . Heart attack Other        uncle MI at age 14s  . Stroke Other        GM, in her 70  . Colon cancer Neg Hx   . Prostate cancer Neg Hx   . Colon polyps Neg Hx   . Esophageal cancer Neg Hx   . Rectal cancer Neg Hx   . Stomach cancer Neg Hx   . Pancreatic cancer Neg Hx     ROS: Review of Systems  Constitutional: Negative for weight loss.  Respiratory: Positive for shortness of breath ( with activity).   Cardiovascular: Negative for chest pain.  Gastrointestinal: Negative for nausea and vomiting.  Genitourinary:       Negative for polyuria.  Musculoskeletal:       Negative for muscle weakness.   Endo/Heme/Allergies: Negative for polydipsia.    PHYSICAL EXAM: Blood pressure 117/73, pulse (!) 58, temperature 97.8 F (36.6 C), temperature source Oral, height 5\' 11"  (1.803 m), weight 214 lb (97.1 kg), SpO2 97 %. Body mass index is 29.85 kg/m. Physical Exam Vitals signs reviewed.  Constitutional:      Appearance: Normal appearance. He is obese.  Cardiovascular:     Rate and Rhythm: Normal rate.  Pulmonary:     Effort: Pulmonary effort is normal.  Musculoskeletal: Normal range of motion.  Skin:    General: Skin is warm and dry.  Neurological:     Mental Status: He is alert and oriented to person, place, and time.  Psychiatric:        Mood and Affect: Mood normal.        Behavior: Behavior normal.     RECENT LABS AND TESTS: BMET    Component Value Date/Time   NA 141 03/04/2018 0803   K 4.5 03/04/2018 0803   CL 103 03/04/2018 0803   CO2 20 03/04/2018 0803   GLUCOSE 91 03/04/2018 0803   GLUCOSE 149 (H) 08/24/2017 0257   BUN 25 03/04/2018 0803   CREATININE 1.13 03/04/2018 0803   CALCIUM 9.4 03/04/2018 0803   GFRNONAA 69 03/04/2018 0803   GFRAA 80 03/04/2018 0803   Lab Results  Component Value Date   HGBA1C 5.7 (H) 03/04/2018   HGBA1C 5.5 11/26/2017   HGBA1C 5.7 (H) 08/08/2017   Lab Results  Component Value Date   INSULIN 17.8 03/04/2018   INSULIN 8.7 11/26/2017   INSULIN 11.8 08/08/2017   CBC    Component Value Date/Time   WBC 12.1 (H) 08/24/2017 0257   RBC 3.93 (L) 08/24/2017 0257   HGB 11.0 (L) 08/24/2017 0257   HCT 33.6 (L) 08/24/2017 0257   PLT 204 08/24/2017 0257   MCV 85.5 08/24/2017 0257   MCH 28.0 08/24/2017 0257   MCHC 32.7 08/24/2017 0257   RDW 14.8 08/24/2017 0257   LYMPHSABS 1.7 07/15/2017 0828   MONOABS 0.4 07/15/2017 0828   EOSABS 0.2 07/15/2017 0828   BASOSABS 0.0 07/15/2017 0828   Iron/TIBC/Ferritin/ %  Sat No results found for: IRON, TIBC, FERRITIN, IRONPCTSAT Lipid Panel     Component Value Date/Time   CHOL 175 03/04/2018  0803   TRIG 343 (H) 03/04/2018 0803   HDL 39 (L) 03/04/2018 0803   CHOLHDL 3.8 10/04/2017 0802   CHOLHDL 4 07/15/2017 0828   VLDL 41.0 (H) 07/15/2017 0828   LDLCALC 67 03/04/2018 0803   LDLDIRECT 105.0 07/15/2017 0828   Hepatic Function Panel     Component Value Date/Time   PROT 7.0 03/04/2018 0803   ALBUMIN 4.5 03/04/2018 0803   AST 17 03/04/2018 0803   ALT 29 03/04/2018 0803   ALKPHOS 61 03/04/2018 0803   BILITOT 0.4 03/04/2018 0803   BILIDIR 0.11 10/04/2017 0802      Component Value Date/Time   TSH 2.080 08/08/2017 1016   TSH 2.14 07/15/2017 0828   TSH 1.62 07/08/2015 1405   Results for Hovis, Colby A "PAT" (MRN [id number]) as of 06/17/2018 13:56  Ref. Range 03/04/2018 08:03  Vitamin D, 25-Hydroxy Latest Ref Range: 30.0 - 100.0 ng/mL 26.3 (L)    OBESITY BEHAVIORAL INTERVENTION VISIT  Today's visit was # 12   Starting weight: 216 lbs Starting date: 08/08/17 Today's weight : Weight: 214 lb (97.1 kg)  Today's date: 06/17/2018 Total lbs lost to date: 2    06/17/2018  Height 5\' 11"  (1.803 m)  Weight 214 lb (97.1 kg)  BMI (Calculated) 29.86  BLOOD PRESSURE - SYSTOLIC 341  BLOOD PRESSURE - DIASTOLIC 73   Body Fat % 93.7 %  Total Body Water (lbs) 107.4 lbs  RMR 1916   ASK: We discussed the diagnosis of obesity with [name] today and Murice agreed to give [name] permission to discuss obesity behavioral modification therapy today.  ASSESS: Keylon has the diagnosis of obesity and his BMI today is 29.86. Donovon is in the action stage of change.   ADVISE: Beatrice was educated on the multiple health risks of obesity as well as the benefit of weight loss to improve his health. He was advised of the need for long term treatment and the importance of lifestyle modifications to improve his current health and to decrease his risk of future health problems.  AGREE: Multiple dietary modification options and treatment options were discussed and Theoren agreed to  follow the recommendations documented in the above note.  ARRANGE: Arjen was educated on the importance of frequent visits to treat obesity as outlined per CMS and USPSTF guidelines and agreed to schedule his next follow up appointment today.  IMarcille Blanco, CMA, am acting as transcriptionist for Starlyn Skeans, MD  I have reviewed the above documentation for accuracy and completeness, and I agree with the above. -Dennard Nip, MD

## 2018-07-06 ENCOUNTER — Encounter: Payer: Self-pay | Admitting: Internal Medicine

## 2018-07-11 ENCOUNTER — Other Ambulatory Visit (INDEPENDENT_AMBULATORY_CARE_PROVIDER_SITE_OTHER): Payer: Self-pay | Admitting: Family Medicine

## 2018-07-11 DIAGNOSIS — E559 Vitamin D deficiency, unspecified: Secondary | ICD-10-CM

## 2018-07-14 ENCOUNTER — Encounter: Payer: Self-pay | Admitting: Internal Medicine

## 2018-07-15 ENCOUNTER — Ambulatory Visit (INDEPENDENT_AMBULATORY_CARE_PROVIDER_SITE_OTHER): Payer: BLUE CROSS/BLUE SHIELD | Admitting: Family Medicine

## 2018-07-17 ENCOUNTER — Encounter (INDEPENDENT_AMBULATORY_CARE_PROVIDER_SITE_OTHER): Payer: Self-pay

## 2018-07-17 ENCOUNTER — Encounter: Payer: Self-pay | Admitting: Internal Medicine

## 2018-07-26 ENCOUNTER — Other Ambulatory Visit: Payer: Self-pay | Admitting: Physician Assistant

## 2018-08-19 ENCOUNTER — Other Ambulatory Visit: Payer: Self-pay

## 2018-08-19 ENCOUNTER — Encounter (INDEPENDENT_AMBULATORY_CARE_PROVIDER_SITE_OTHER): Payer: Self-pay | Admitting: Bariatrics

## 2018-08-19 ENCOUNTER — Ambulatory Visit (INDEPENDENT_AMBULATORY_CARE_PROVIDER_SITE_OTHER): Payer: BLUE CROSS/BLUE SHIELD | Admitting: Bariatrics

## 2018-08-19 DIAGNOSIS — E559 Vitamin D deficiency, unspecified: Secondary | ICD-10-CM | POA: Diagnosis not present

## 2018-08-19 DIAGNOSIS — E669 Obesity, unspecified: Secondary | ICD-10-CM | POA: Diagnosis not present

## 2018-08-19 DIAGNOSIS — Z6830 Body mass index (BMI) 30.0-30.9, adult: Secondary | ICD-10-CM

## 2018-08-19 DIAGNOSIS — K219 Gastro-esophageal reflux disease without esophagitis: Secondary | ICD-10-CM

## 2018-08-19 MED ORDER — VITAMIN D (ERGOCALCIFEROL) 1.25 MG (50000 UNIT) PO CAPS: 50000.0000 [IU] | ORAL_CAPSULE | ORAL | 0 refills | Status: DC

## 2018-08-19 NOTE — Progress Notes (Signed)
Office: [phone]  /  Fax: [phone] TeleHealth Visit:  Shane Wood has verbally consented to this TeleHealth visit today. The patient is located at home, the provider is located at the [employer] [facility] office. The participants in this visit include the listed provider and patient and any and all parties involved. The visit was conducted today via FaceTime.  HPI:   Chief Complaint: OBESITY Shane Wood is here to discuss his progress with his obesity treatment plan. He is on the keep a food journal with 1400 to 1700 calories and 100+ grams of protein daily plan and is following his eating plan approximately 50 % of the time. He states he is biking and walking for 30 minutes 7 times per week. Shane Wood states that he has stayed the same (weight 211 lbs). He was last seen on 06/17/18. He occasionally does boredom eating. His appetite is normal. We were unable to weigh the patient today for this TeleHealth visit. He feels as if he has maintained weight since his last visit. He has lost 5 lbs since starting treatment with [name].  Vitamin D deficiency Shane Wood has a diagnosis of vitamin D deficiency. He is currently taking vit D and denies nausea, vomiting or muscle weakness.  GERD (gastroesophageal reflux disease) Shane Wood has a diagnosis of gastroesophageal reflux disease and he is currently taking Prilosec.  ASSESSMENT AND PLAN:  Vitamin D deficiency - Plan: Vitamin D, Ergocalciferol, (DRISDOL) 1.25 MG (50000 UT) CAPS capsule  Gastroesophageal reflux disease, esophagitis presence not specified  Class 1 obesity with serious comorbidity and body mass index (BMI) of 30.0 to 30.9 in adult, unspecified obesity type - Starting BMI greater then 30  PLAN:  Vitamin D Deficiency Shane Wood was informed that low vitamin D levels contributes to fatigue and are associated with obesity, breast, and colon cancer. He agrees to continue to take prescription Vit D @50 ,000 IU every week #4 with no  refills and will follow up for routine testing of vitamin D, at least 2-3 times per year. He was informed of the risk of over-replacement of vitamin D and agrees to not increase his dose unless he discusses this with [name] first. Cutter agrees to follow up as directed.  GERD (gastroesophageal reflux disease) Shane Wood will continue his medications and he will avoid triggers. Shane Wood agreed to follow up as directed.  Obesity Shane Wood is currently in the action stage of change. As such, his goal is to continue with weight loss efforts He has agreed to keep a food journal with 1400 to 1700 calories and 100+ grams of protein daily Shane Wood will continue his exercise regimen (biking, walking) for weight loss and overall health benefits. We discussed the following Behavioral Modification Strategies today: increase H2O intake, no skipping meals, keeping healthy foods in the home, increasing lean protein intake, decreasing simple carbohydrates, increasing vegetables, decrease eating out and work on meal planning and easy cooking plans Shane Wood will weigh himself at home before each visit. He will consider resistance bands and weights.  Shane Wood has agreed to follow up with our clinic in 2 weeks. He was informed of the importance of frequent follow up visits to maximize his success with intensive lifestyle modifications for his multiple health conditions.  ALLERGIES: No Known Allergies  MEDICATIONS: Current Outpatient Medications on File Prior to Visit  Medication Sig Dispense Refill  . Ascorbic Acid (VITAMIN C) 1000 MG tablet Take 1,000 mg by mouth daily.    [name] aspirin 81 MG EC tablet TAKE 1 TABLET BY MOUTH  EVERY DAY 90 tablet 1  . atorvastatin (LIPITOR) 80 MG tablet TAKE 1 TABLET (80 MG TOTAL) BY MOUTH DAILY AT 6 PM. 90 tablet 1  . Cyanocobalamin (VITAMIN B 12 PO) Take 1 tablet by mouth daily.    . metoprolol tartrate (LOPRESSOR) 25 MG tablet Take 0.5 tablets (12.5 mg total) by mouth 2 (two) times daily. 60  tablet 11  . Multiple Vitamin (MULTIVITAMIN) tablet Take 1 tablet by mouth daily.      . nitroGLYCERIN (NITROSTAT) 0.4 MG SL tablet Place 1 tablet (0.4 mg total) under the tongue every 5 (five) minutes x 3 doses as needed for chest pain. 25 tablet 11  . omeprazole (PRILOSEC) 20 MG capsule Take 20 mg by mouth daily.    . ticagrelor (BRILINTA) 90 MG TABS tablet Take 1 tablet (90 mg total) by mouth 2 (two) times daily. 60 tablet 0   No current facility-administered medications on file prior to visit.     PAST MEDICAL HISTORY: Past Medical History:  Diagnosis Date  . Allergy   . BACK PAIN   . ELEVATED BP READING WITHOUT DX HYPERTENSION 05/03/2008   Qualifier: Diagnosis of  By: Larose Kells MD, [place] GERD (gastroesophageal reflux disease)   . H/O cardiovascular stress test 2005    (-)  . Hyperlipidemia   . INSOMNIA-SLEEP DISORDER-UNSPEC   . PVC (premature ventricular contraction)     PAST SURGICAL HISTORY: Past Surgical History:  Procedure Laterality Date  . ARTERY REPAIR Right 08/23/2017   Procedure: EXPLORATION RIGHT RADIAL ARTERY,  RIGHT FOREARM FASCIOTOMY;  Surgeon: Elam Dutch, MD;  Location: [facility] OR;  Service: Vascular;  Laterality: Right;  . CHOLECYSTECTOMY    . CORONARY STENT INTERVENTION N/A 08/23/2017   Procedure: CORONARY STENT INTERVENTION;  Surgeon: Jettie Booze, MD;  Location: [place] [facility];  Service: Cardiovascular;  Laterality: N/A;  . LEFT HEART CATH AND CORONARY ANGIOGRAPHY N/A 08/23/2017   Procedure: LEFT HEART CATH AND CORONARY ANGIOGRAPHY;  Surgeon: Jettie Booze, MD;  Location: [place] [facility];  Service: Cardiovascular;  Laterality: N/A;  . POLYPECTOMY    . TONSILLECTOMY     age 63 y/o    SOCIAL HISTORY: Social History   Tobacco Use  . Smoking status: Former Smoker    Last attempt to quit: 09/24/1988    Years since quitting: 29.9  . Smokeless tobacco: Former Systems developer    Types: Chew    Quit date: 1970  Substance Use Topics  . Alcohol use:  Yes    Alcohol/week: 4.0 standard drinks    Types: 4 Glasses of wine per week    Comment: occasionally on weekends  . Drug use: No    FAMILY HISTORY: Family History  Problem Relation Age of Onset  . Lung cancer Father        smoker  . Diabetes Maternal Grandmother   . Diabetes Maternal Grandfather   . Hypertension Mother   . Depression Mother   . Obesity Mother   . Heart attack Other        uncle MI at age 23s  . Stroke Other        GM, in her 19  . Colon cancer Neg Hx   . Prostate cancer Neg Hx   . Colon polyps Neg Hx   . Esophageal cancer Neg Hx   . Rectal cancer Neg Hx   . Stomach cancer Neg Hx   . Pancreatic cancer Neg Hx     ROS:  Review of Systems  Constitutional: Negative for weight loss.  Gastrointestinal: Negative for nausea and vomiting.  Musculoskeletal:       Negative for muscle weakness    PHYSICAL EXAM: Pt in no acute distress  RECENT LABS AND TESTS: BMET    Component Value Date/Time   NA 141 03/04/2018 0803   K 4.5 03/04/2018 0803   CL 103 03/04/2018 0803   CO2 20 03/04/2018 0803   GLUCOSE 91 03/04/2018 0803   GLUCOSE 149 (H) 08/24/2017 0257   BUN 25 03/04/2018 0803   CREATININE 1.13 03/04/2018 0803   CALCIUM 9.4 03/04/2018 0803   GFRNONAA 69 03/04/2018 0803   GFRAA 80 03/04/2018 0803   Lab Results  Component Value Date   HGBA1C 5.7 (H) 03/04/2018   HGBA1C 5.5 11/26/2017   HGBA1C 5.7 (H) 08/08/2017   Lab Results  Component Value Date   INSULIN 17.8 03/04/2018   INSULIN 8.7 11/26/2017   INSULIN 11.8 08/08/2017   CBC    Component Value Date/Time   WBC 12.1 (H) 08/24/2017 0257   RBC 3.93 (L) 08/24/2017 0257   HGB 11.0 (L) 08/24/2017 0257   HCT 33.6 (L) 08/24/2017 0257   PLT 204 08/24/2017 0257   MCV 85.5 08/24/2017 0257   MCH 28.0 08/24/2017 0257   MCHC 32.7 08/24/2017 0257   RDW 14.8 08/24/2017 0257   LYMPHSABS 1.7 07/15/2017 0828   MONOABS 0.4 07/15/2017 0828   EOSABS 0.2 07/15/2017 0828   BASOSABS 0.0 07/15/2017 0828    Iron/TIBC/Ferritin/ %Sat No results found for: IRON, TIBC, FERRITIN, IRONPCTSAT Lipid Panel     Component Value Date/Time   CHOL 175 03/04/2018 0803   TRIG 343 (H) 03/04/2018 0803   HDL 39 (L) 03/04/2018 0803   CHOLHDL 3.8 10/04/2017 0802   CHOLHDL 4 07/15/2017 0828   VLDL 41.0 (H) 07/15/2017 0828   LDLCALC 67 03/04/2018 0803   LDLDIRECT 105.0 07/15/2017 0828   Hepatic Function Panel     Component Value Date/Time   PROT 7.0 03/04/2018 0803   ALBUMIN 4.5 03/04/2018 0803   AST 17 03/04/2018 0803   ALT 29 03/04/2018 0803   ALKPHOS 61 03/04/2018 0803   BILITOT 0.4 03/04/2018 0803   BILIDIR 0.11 10/04/2017 0802      Component Value Date/Time   TSH 2.080 08/08/2017 1016   TSH 2.14 07/15/2017 0828   TSH 1.62 07/08/2015 1405     Ref. Range 03/04/2018 08:03  Vitamin D, 25-Hydroxy Latest Ref Range: 30.0 - 100.0 ng/mL 26.3 (L)    I, Doreene Nest, am acting as Location manager for [employer]. Owens Shark, DO  I have reviewed the above documentation for accuracy and completeness, and I agree with the above. -Shane Lesch, DO

## 2018-08-21 ENCOUNTER — Other Ambulatory Visit: Payer: Self-pay | Admitting: Physician Assistant

## 2018-08-21 NOTE — Telephone Encounter (Signed)
Please review for refill.  

## 2018-09-02 ENCOUNTER — Encounter (INDEPENDENT_AMBULATORY_CARE_PROVIDER_SITE_OTHER): Payer: Self-pay | Admitting: Bariatrics

## 2018-09-02 ENCOUNTER — Ambulatory Visit (INDEPENDENT_AMBULATORY_CARE_PROVIDER_SITE_OTHER): Payer: 59 | Admitting: Bariatrics

## 2018-09-02 ENCOUNTER — Other Ambulatory Visit: Payer: Self-pay

## 2018-09-02 DIAGNOSIS — E781 Pure hyperglyceridemia: Secondary | ICD-10-CM | POA: Diagnosis not present

## 2018-09-02 DIAGNOSIS — E559 Vitamin D deficiency, unspecified: Secondary | ICD-10-CM

## 2018-09-02 DIAGNOSIS — Z6830 Body mass index (BMI) 30.0-30.9, adult: Secondary | ICD-10-CM | POA: Diagnosis not present

## 2018-09-02 DIAGNOSIS — E669 Obesity, unspecified: Secondary | ICD-10-CM

## 2018-09-02 NOTE — Progress Notes (Signed)
Office: [phone]  /  Fax: [phone] TeleHealth Visit:  Shane Wood has verbally consented to this TeleHealth visit today. The patient is located at home, the provider is located at the [employer] [facility] office. The participants in this visit include the listed provider and patient and any and all parties involved. The visit was conducted today via FaceTime.  HPI:   Chief Complaint: OBESITY Shane Wood is here to discuss his progress with his obesity treatment plan. He is on the keep a food journal with 1400 to 1700 calories and 100 grams of protein daily plan and is following his eating plan approximately 45 % of the time. He states he is walking for 60 minutes 7 times per week. Shane Wood states that he weighs the same (weight 211 lbs). He denies any specific struggles. We were unable to weigh the patient today for this TeleHealth visit. He feels as if he has maintained weight since his last visit. He has lost 5 lbs since starting treatment with [name].  Elevated Triglycerides Shane Wood has elevated triglycerides (see last labs) and he is trying to improve his triglyceride levels with intensive lifestyle modification including a low saturated fat diet, exercise and weight loss. He denies any chest pain or myalgias.  Vitamin D deficiency Shane Wood has a diagnosis of vitamin D deficiency. He is currently taking vit D and denies nausea, vomiting or muscle weakness.  ASSESSMENT AND PLAN:  Hypertriglyceridemia - Plan: Comprehensive metabolic panel, Hemoglobin A1c, Insulin, random, Lipid Panel With LDL/HDL Ratio  Vitamin D deficiency - Plan: Comprehensive metabolic panel, Hemoglobin A1c, Insulin, random, VITAMIN D 25 Hydroxy (Vit-D Deficiency, Fractures)  Class 1 obesity with serious comorbidity and body mass index (BMI) of 30.0 to 30.9 in adult, unspecified obesity type - Starting BMI greater then 30  PLAN:  Elevated Triglycerides Shane Wood was informed of the American Heart  Association Guidelines emphasizing intensive lifestyle modifications as the first line treatment for elevated triglycerides. We discussed many lifestyle modifications today in depth, and [name] will continue to work on decreasing saturated fats such as fatty red meat, butter and many fried foods. He will also increase vegetables and lean protein in his diet and continue to work on exercise and weight loss efforts. We will check lipid panel and Shane Wood will follow up with our clinic in 2 weeks.  Vitamin D Deficiency Shane Wood was informed that low vitamin D levels contributes to fatigue and are associated with obesity, breast, and colon cancer. He will continue to take prescription Vit D @50 ,000 IU every week and will follow up for routine testing of vitamin D, at least 2-3 times per year. He was informed of the risk of over-replacement of vitamin D and agrees to not increase his dose unless he discusses this with [name] first.  Obesity Shane Wood is currently in the action stage of change. As such, his goal is to continue with weight loss efforts He has agreed to keep a food journal with 1400 to 1700 calories and 100 grams of protein daily Shane Wood will continue exercise (walking) and will start resistance exercise for weight loss and overall health benefits. We discussed the following Behavioral Modification Strategies today: increase H2O intake, no skipping meals, keeping healthy foods in the home, increasing lean protein intake, decreasing simple carbohydrates, increasing vegetables, decrease eating out and work on meal planning and easy cooking plans Shane Wood will weigh himself at home and record before each visit. He will increase adherence to the plan.  Shane Wood has agreed to follow up  with our clinic in 2 weeks. He was informed of the importance of frequent follow up visits to maximize his success with intensive lifestyle modifications for his multiple health conditions.  ALLERGIES: No Known  Allergies  MEDICATIONS: Current Outpatient Medications on File Prior to Visit  Medication Sig Dispense Refill   Ascorbic Acid (VITAMIN C) 1000 MG tablet Take 1,000 mg by mouth daily.     aspirin 81 MG EC tablet TAKE 1 TABLET BY MOUTH EVERY DAY 90 tablet 1   atorvastatin (LIPITOR) 80 MG tablet TAKE 1 TABLET (80 MG TOTAL) BY MOUTH DAILY AT 6 PM. 90 tablet 1   BRILINTA 90 MG TABS tablet TAKE 1 TABLET (90 MG TOTAL) BY MOUTH 2 (TWO) TIMES DAILY. 60 tablet 0   Cyanocobalamin (VITAMIN B 12 PO) Take 1 tablet by mouth daily.     metoprolol tartrate (LOPRESSOR) 25 MG tablet Take 0.5 tablets (12.5 mg total) by mouth 2 (two) times daily. 60 tablet 11   Multiple Vitamin (MULTIVITAMIN) tablet Take 1 tablet by mouth daily.       nitroGLYCERIN (NITROSTAT) 0.4 MG SL tablet Place 1 tablet (0.4 mg total) under the tongue every 5 (five) minutes x 3 doses as needed for chest pain. 25 tablet 11   omeprazole (PRILOSEC) 20 MG capsule Take 20 mg by mouth daily.     Vitamin D, Ergocalciferol, (DRISDOL) 1.25 MG (50000 UT) CAPS capsule Take 1 capsule (50,000 Units total) by mouth every 7 (seven) days. 4 capsule 0   No current facility-administered medications on file prior to visit.     PAST MEDICAL HISTORY: Past Medical History:  Diagnosis Date   Allergy    BACK PAIN    ELEVATED BP READING WITHOUT DX HYPERTENSION 05/03/2008   Qualifier: Diagnosis of  By: Shane Kells MD, Shane Wood.    GERD (gastroesophageal reflux disease)    H/O cardiovascular stress test 2005    (-)   Hyperlipidemia    INSOMNIA-SLEEP DISORDER-UNSPEC    PVC (premature ventricular contraction)     PAST SURGICAL HISTORY: Past Surgical History:  Procedure Laterality Date   ARTERY REPAIR Right 08/23/2017   Procedure: EXPLORATION RIGHT RADIAL ARTERY,  RIGHT FOREARM FASCIOTOMY;  Surgeon: Shane Dutch, MD;  Location: [place];  Service: Vascular;  Laterality: Right;   CHOLECYSTECTOMY     CORONARY STENT INTERVENTION N/A 08/23/2017    Procedure: CORONARY STENT INTERVENTION;  Surgeon: Shane Booze, MD;  Location: [place] [facility];  Service: Cardiovascular;  Laterality: N/A;   LEFT HEART CATH AND CORONARY ANGIOGRAPHY N/A 08/23/2017   Procedure: LEFT HEART CATH AND CORONARY ANGIOGRAPHY;  Surgeon: Shane Booze, MD;  Location: [place] [facility];  Service: Cardiovascular;  Laterality: N/A;   POLYPECTOMY     TONSILLECTOMY     age 89 y/o    SOCIAL HISTORY: Social History   Tobacco Use   Smoking status: Former Smoker    Last attempt to quit: 09/24/1988    Years since quitting: 29.9   Smokeless tobacco: Former Systems developer    Types: Chew    Quit date: 1970  Substance Use Topics   Alcohol use: Yes    Alcohol/week: 4.0 standard drinks    Types: 4 Glasses of wine per week    Comment: occasionally on weekends   Drug use: No    FAMILY HISTORY: Family History  Problem Relation Age of Onset   Lung cancer Father        smoker   Diabetes Maternal Grandmother  Diabetes Maternal Grandfather    Hypertension Mother    Depression Mother    Obesity Mother    Heart attack Other        uncle MI at age 41s   Stroke Other        GM, in her 58   Colon cancer Neg Hx    Prostate cancer Neg Hx    Colon polyps Neg Hx    Esophageal cancer Neg Hx    Rectal cancer Neg Hx    Stomach cancer Neg Hx    Pancreatic cancer Neg Hx     ROS: Review of Systems  Constitutional: Negative for weight loss.  Cardiovascular: Negative for chest pain.  Gastrointestinal: Negative for nausea and vomiting.  Musculoskeletal: Negative for myalgias.       Negative for muscle weakness    PHYSICAL EXAM: Pt in no acute distress  RECENT LABS AND TESTS: BMET    Component Value Date/Time   NA 141 03/04/2018 0803   K 4.5 03/04/2018 0803   CL 103 03/04/2018 0803   CO2 20 03/04/2018 0803   GLUCOSE 91 03/04/2018 0803   GLUCOSE 149 (H) 08/24/2017 0257   BUN 25 03/04/2018 0803   CREATININE 1.13 03/04/2018 0803    CALCIUM 9.4 03/04/2018 0803   GFRNONAA 69 03/04/2018 0803   GFRAA 80 03/04/2018 0803   Lab Results  Component Value Date   HGBA1C 5.7 (H) 03/04/2018   HGBA1C 5.5 11/26/2017   HGBA1C 5.7 (H) 08/08/2017   Lab Results  Component Value Date   INSULIN 17.8 03/04/2018   INSULIN 8.7 11/26/2017   INSULIN 11.8 08/08/2017   CBC    Component Value Date/Time   WBC 12.1 (H) 08/24/2017 0257   RBC 3.93 (L) 08/24/2017 0257   HGB 11.0 (L) 08/24/2017 0257   HCT 33.6 (L) 08/24/2017 0257   PLT 204 08/24/2017 0257   MCV 85.5 08/24/2017 0257   MCH 28.0 08/24/2017 0257   MCHC 32.7 08/24/2017 0257   RDW 14.8 08/24/2017 0257   LYMPHSABS 1.7 07/15/2017 0828   MONOABS 0.4 07/15/2017 0828   EOSABS 0.2 07/15/2017 0828   BASOSABS 0.0 07/15/2017 0828   Iron/TIBC/Ferritin/ %Sat No results found for: IRON, TIBC, FERRITIN, IRONPCTSAT Lipid Panel     Component Value Date/Time   CHOL 175 03/04/2018 0803   TRIG 343 (H) 03/04/2018 0803   HDL 39 (L) 03/04/2018 0803   CHOLHDL 3.8 10/04/2017 0802   CHOLHDL 4 07/15/2017 0828   VLDL 41.0 (H) 07/15/2017 0828   LDLCALC 67 03/04/2018 0803   LDLDIRECT 105.0 07/15/2017 0828   Hepatic Function Panel     Component Value Date/Time   PROT 7.0 03/04/2018 0803   ALBUMIN 4.5 03/04/2018 0803   AST 17 03/04/2018 0803   ALT 29 03/04/2018 0803   ALKPHOS 61 03/04/2018 0803   BILITOT 0.4 03/04/2018 0803   BILIDIR 0.11 10/04/2017 0802      Component Value Date/Time   TSH 2.080 08/08/2017 1016   TSH 2.14 07/15/2017 0828   TSH 1.62 07/08/2015 1405     Ref. Range 03/04/2018 08:03  Vitamin D, 25-Hydroxy Latest Ref Range: 30.0 - 100.0 ng/mL 26.3 (L)    I, Doreene Nest, am acting as Location manager for [employer]. Owens Shark, DO  I have reviewed the above documentation for accuracy and completeness, and I agree with the above. -Jearld Lesch, DO

## 2018-09-10 ENCOUNTER — Other Ambulatory Visit (INDEPENDENT_AMBULATORY_CARE_PROVIDER_SITE_OTHER): Payer: Self-pay | Admitting: Bariatrics

## 2018-09-10 DIAGNOSIS — E559 Vitamin D deficiency, unspecified: Secondary | ICD-10-CM

## 2018-09-11 ENCOUNTER — Encounter (INDEPENDENT_AMBULATORY_CARE_PROVIDER_SITE_OTHER): Payer: Self-pay | Admitting: Bariatrics

## 2018-09-16 ENCOUNTER — Encounter (INDEPENDENT_AMBULATORY_CARE_PROVIDER_SITE_OTHER): Payer: Self-pay | Admitting: Bariatrics

## 2018-09-16 ENCOUNTER — Ambulatory Visit (INDEPENDENT_AMBULATORY_CARE_PROVIDER_SITE_OTHER): Payer: 59 | Admitting: Bariatrics

## 2018-09-16 ENCOUNTER — Other Ambulatory Visit: Payer: Self-pay

## 2018-09-16 DIAGNOSIS — E669 Obesity, unspecified: Secondary | ICD-10-CM | POA: Diagnosis not present

## 2018-09-16 DIAGNOSIS — E7849 Other hyperlipidemia: Secondary | ICD-10-CM

## 2018-09-16 DIAGNOSIS — Z6830 Body mass index (BMI) 30.0-30.9, adult: Secondary | ICD-10-CM

## 2018-09-16 DIAGNOSIS — E559 Vitamin D deficiency, unspecified: Secondary | ICD-10-CM | POA: Diagnosis not present

## 2018-09-16 NOTE — Progress Notes (Signed)
Office: [phone]  /  Fax: [phone] TeleHealth Visit:  Shane Wood has verbally consented to this TeleHealth visit today. The patient is located at the office, the provider is located at the [employer] [facility] office. The participants in this visit include the listed provider and patient and any and all parties involved. The visit was conducted today via FaceTime.  HPI:   Chief Complaint: OBESITY Shane Wood is here to discuss his progress with his obesity treatment plan. He is on the keep a food journal with 1400 to 1700 calories and 100 grams of protein daily plan and is following his eating plan approximately 50 % of the time. He states he is walking 30 to 60 minutes 7 times per week. Shane Wood states that he has gained 3 pounds over the weekend. We were unable to weigh the patient today for this TeleHealth visit. He feels as if he has gained weight since his last visit. He has lost 1 lb since starting treatment with [name].  Hyperlipidemia Shane Wood has hyperlipidemia and he is taking Lipitor. His triglycerides are elevated and he has been trying to improve his cholesterol levels with intensive lifestyle modification including a low saturated fat diet, exercise and weight loss. He denies any chest pain or myalgias.  Vitamin D deficiency Shane Wood has a diagnosis of vitamin D deficiency. He is currently taking vit D and denies nausea, vomiting or muscle weakness.  ASSESSMENT AND PLAN:  Other hyperlipidemia  Vitamin D deficiency - Plan: Vitamin D, Ergocalciferol, (DRISDOL) 1.25 MG (50000 UT) CAPS capsule  Class 1 obesity with serious comorbidity and body mass index (BMI) of 30.0 to 30.9 in adult, unspecified obesity type - Starting BMI greater then 30  PLAN:  Hyperlipidemia Shane Wood was informed of the American Heart Association Guidelines emphasizing intensive lifestyle modifications as the first line treatment for hyperlipidemia. We discussed many lifestyle modifications  today in depth, and Shane Wood will continue to work on decreasing saturated fats such as fatty red meat, butter and many fried foods. [name] will decrease carbohydrates for triglycerides. He will also increase MUFA's, PUFA's, vegetables and lean protein in his diet and continue to work on exercise and weight loss efforts. Shane Wood will continue his medications and follow up as directed.  Vitamin D Deficiency Shane Wood was informed that low vitamin D levels contributes to fatigue and are associated with obesity, breast, and colon cancer. He agrees to continue to take prescription Vit D @50 ,000 IU every week #4 with no refills and will follow up for routine testing of vitamin D, at least 2-3 times per year. He was informed of the risk of over-replacement of vitamin D and agrees to not increase his dose unless he discusses this with [name] first. Shane Wood agreed to follow up as directed.  Obesity Shane Wood is currently in the action stage of change. As such, his goal is to continue with weight loss efforts He has agreed to keep a food journal with 1400 to 1700 calories and 100 grams of protein daily Shane Wood will continue his walking regimen and will begin resistance exercise for weight loss and overall health benefits. We discussed the following Behavioral Modification Strategies today: increase H2O intake, no skipping meals, keeping healthy foods in the home, increasing lean protein intake, decreasing simple carbohydrates, increasing vegetables, increasing fiber rich foods, decrease eating out, work on meal planning and easy cooking plans, holiday eating strategies and celebration eating strategies Shane Wood will weigh himself at home. He will come in on Thursday (for labs). He will  eat his breakfast.  Shane Wood has agreed to follow up with our clinic in 2 weeks. He was informed of the importance of frequent follow up visits to maximize his success with intensive lifestyle modifications for his multiple health  conditions.  ALLERGIES: No Known Allergies  MEDICATIONS: Current Outpatient Medications on File Prior to Visit  Medication Sig Dispense Refill   Ascorbic Acid (VITAMIN C) 1000 MG tablet Take 1,000 mg by mouth daily.     aspirin 81 MG EC tablet TAKE 1 TABLET BY MOUTH EVERY DAY 90 tablet 1   atorvastatin (LIPITOR) 80 MG tablet TAKE 1 TABLET (80 MG TOTAL) BY MOUTH DAILY AT 6 PM. 90 tablet 1   BRILINTA 90 MG TABS tablet TAKE 1 TABLET (90 MG TOTAL) BY MOUTH 2 (TWO) TIMES DAILY. 60 tablet 0   Cyanocobalamin (VITAMIN B 12 PO) Take 1 tablet by mouth daily.     metoprolol tartrate (LOPRESSOR) 25 MG tablet Take 0.5 tablets (12.5 mg total) by mouth 2 (two) times daily. 60 tablet 11   Multiple Vitamin (MULTIVITAMIN) tablet Take 1 tablet by mouth daily.       nitroGLYCERIN (NITROSTAT) 0.4 MG SL tablet Place 1 tablet (0.4 mg total) under the tongue every 5 (five) minutes x 3 doses as needed for chest pain. 25 tablet 11   omeprazole (PRILOSEC) 20 MG capsule Take 20 mg by mouth daily.     Vitamin D, Ergocalciferol, (DRISDOL) 1.25 MG (50000 UT) CAPS capsule Take 1 capsule (50,000 Units total) by mouth every 7 (seven) days. 4 capsule 0   No current facility-administered medications on file prior to visit.     PAST MEDICAL HISTORY: Past Medical History:  Diagnosis Date   Allergy    BACK PAIN    ELEVATED BP READING WITHOUT DX HYPERTENSION 05/03/2008   Qualifier: Diagnosis of  By: Larose Kells MD, Alda Berthold.    GERD (gastroesophageal reflux disease)    H/O cardiovascular stress test 2005    (-)   Hyperlipidemia    INSOMNIA-SLEEP DISORDER-UNSPEC    PVC (premature ventricular contraction)     PAST SURGICAL HISTORY: Past Surgical History:  Procedure Laterality Date   ARTERY REPAIR Right 08/23/2017   Procedure: EXPLORATION RIGHT RADIAL ARTERY,  RIGHT FOREARM FASCIOTOMY;  Surgeon: Elam Dutch, MD;  Location: [facility];  Service: Vascular;  Laterality: Right;   CHOLECYSTECTOMY     CORONARY  STENT INTERVENTION N/A 08/23/2017   Procedure: CORONARY STENT INTERVENTION;  Surgeon: Jettie Booze, MD;  Location: [facility];  Service: Cardiovascular;  Laterality: N/A;   LEFT HEART CATH AND CORONARY ANGIOGRAPHY N/A 08/23/2017   Procedure: LEFT HEART CATH AND CORONARY ANGIOGRAPHY;  Surgeon: Jettie Booze, MD;  Location: [facility];  Service: Cardiovascular;  Laterality: N/A;   POLYPECTOMY     TONSILLECTOMY     age 63 y/o    SOCIAL HISTORY: Social History   Tobacco Use   Smoking status: Former Smoker    Last attempt to quit: 09/24/1988    Years since quitting: 29.9   Smokeless tobacco: Former Systems developer    Types: Chew    Quit date: 1970  Substance Use Topics   Alcohol use: Yes    Alcohol/week: 4.0 standard drinks    Types: 4 Glasses of wine per week    Comment: occasionally on weekends   Drug use: No    FAMILY HISTORY: Family History  Problem Relation Age of Onset   Lung cancer Father  smoker   Diabetes Maternal Grandmother    Diabetes Maternal Grandfather    Hypertension Mother    Depression Mother    Obesity Mother    Heart attack Other        uncle MI at age 25s   Stroke Other        GM, in her 55   Colon cancer Neg Hx    Prostate cancer Neg Hx    Colon polyps Neg Hx    Esophageal cancer Neg Hx    Rectal cancer Neg Hx    Stomach cancer Neg Hx    Pancreatic cancer Neg Hx     ROS: Review of Systems  Constitutional: Negative for weight loss.  Cardiovascular: Negative for chest pain.  Gastrointestinal: Negative for nausea and vomiting.  Musculoskeletal: Negative for myalgias.       Negative for muscle weakness    PHYSICAL EXAM: Pt in no acute distress  RECENT LABS AND TESTS: BMET    Component Value Date/Time   NA 141 03/04/2018 0803   K 4.5 03/04/2018 0803   CL 103 03/04/2018 0803   CO2 20 03/04/2018 0803   GLUCOSE 91 03/04/2018 0803   GLUCOSE 149 (H) 08/24/2017 0257   BUN 25 03/04/2018 0803    CREATININE 1.13 03/04/2018 0803   CALCIUM 9.4 03/04/2018 0803   GFRNONAA 69 03/04/2018 0803   GFRAA 80 03/04/2018 0803   Lab Results  Component Value Date   HGBA1C 5.7 (H) 03/04/2018   HGBA1C 5.5 11/26/2017   HGBA1C 5.7 (H) 08/08/2017   Lab Results  Component Value Date   INSULIN 17.8 03/04/2018   INSULIN 8.7 11/26/2017   INSULIN 11.8 08/08/2017   CBC    Component Value Date/Time   WBC 12.1 (H) 08/24/2017 0257   RBC 3.93 (L) 08/24/2017 0257   HGB 11.0 (L) 08/24/2017 0257   HCT 33.6 (L) 08/24/2017 0257   PLT 204 08/24/2017 0257   MCV 85.5 08/24/2017 0257   MCH 28.0 08/24/2017 0257   MCHC 32.7 08/24/2017 0257   RDW 14.8 08/24/2017 0257   LYMPHSABS 1.7 07/15/2017 0828   MONOABS 0.4 07/15/2017 0828   EOSABS 0.2 07/15/2017 0828   BASOSABS 0.0 07/15/2017 0828   Iron/TIBC/Ferritin/ %Sat No results found for: IRON, TIBC, FERRITIN, IRONPCTSAT Lipid Panel     Component Value Date/Time   CHOL 175 03/04/2018 0803   TRIG 343 (H) 03/04/2018 0803   HDL 39 (L) 03/04/2018 0803   CHOLHDL 3.8 10/04/2017 0802   CHOLHDL 4 07/15/2017 0828   VLDL 41.0 (H) 07/15/2017 0828   LDLCALC 67 03/04/2018 0803   LDLDIRECT 105.0 07/15/2017 0828   Hepatic Function Panel     Component Value Date/Time   PROT 7.0 03/04/2018 0803   ALBUMIN 4.5 03/04/2018 0803   AST 17 03/04/2018 0803   ALT 29 03/04/2018 0803   ALKPHOS 61 03/04/2018 0803   BILITOT 0.4 03/04/2018 0803   BILIDIR 0.11 10/04/2017 0802      Component Value Date/Time   TSH 2.080 08/08/2017 1016   TSH 2.14 07/15/2017 0828   TSH 1.62 07/08/2015 1405     Ref. Range 03/04/2018 08:03  Vitamin D, 25-Hydroxy Latest Ref Range: 30.0 - 100.0 ng/mL 26.3 (L)    I, Doreene Nest, am acting as Location manager for [employer]. Owens Shark, DO I have reviewed the above documentation for accuracy and completeness, and I agree with the above. -Jearld Lesch, DO

## 2018-09-17 ENCOUNTER — Ambulatory Visit: Payer: Self-pay | Admitting: Internal Medicine

## 2018-09-17 ENCOUNTER — Other Ambulatory Visit: Payer: Self-pay | Admitting: Interventional Cardiology

## 2018-09-17 MED ORDER — VITAMIN D (ERGOCALCIFEROL) 1.25 MG (50000 UNIT) PO CAPS: 50000.0000 [IU] | ORAL_CAPSULE | ORAL | 0 refills | Status: DC

## 2018-09-19 LAB — COMPREHENSIVE METABOLIC PANEL
ALT: 34 IU/L (ref 0–44)
AST: 22 IU/L (ref 0–40)
Albumin/Globulin Ratio: 2.1 (ref 1.2–2.2)
Albumin: 4.4 g/dL (ref 3.8–4.8)
Alkaline Phosphatase: 51 IU/L (ref 39–117)
BUN/Creatinine Ratio: 16 (ref 10–24)
BUN: 17 mg/dL (ref 8–27)
Bilirubin Total: 0.4 mg/dL (ref 0.0–1.2)
CO2: 22 mmol/L (ref 20–29)
Calcium: 9.2 mg/dL (ref 8.6–10.2)
Chloride: 104 mmol/L (ref 96–106)
Creatinine, Ser: 1.06 mg/dL (ref 0.76–1.27)
GFR calc Af Amer: 86 mL/min/{1.73_m2} (ref >59)
GFR calc non Af Amer: 74 mL/min/{1.73_m2} (ref >59)
Globulin, Total: 2.1 g/dL (ref 1.5–4.5)
Glucose: 99 mg/dL (ref 65–99)
Potassium: 4.6 mmol/L (ref 3.5–5.2)
Sodium: 139 mmol/L (ref 134–144)
Total Protein: 6.5 g/dL (ref 6.0–8.5)

## 2018-09-19 LAB — HEMOGLOBIN A1C
Est. average glucose Bld gHb Est-mCnc: 114 mg/dL
Hgb A1c MFr Bld: 5.6 % (ref 4.8–5.6)

## 2018-09-19 LAB — LIPID PANEL WITH LDL/HDL RATIO
Cholesterol, Total: 174 mg/dL (ref 100–199)
HDL: 39 mg/dL — ABNORMAL LOW (ref >39)
LDL Calculated: 96 mg/dL (ref 0–99)
LDl/HDL Ratio: 2.5 ratio (ref 0.0–3.6)
Triglycerides: 196 mg/dL — ABNORMAL HIGH (ref 0–149)
VLDL Cholesterol Cal: 39 mg/dL (ref 5–40)

## 2018-09-19 LAB — INSULIN, RANDOM: INSULIN: 15.3 u[IU]/mL (ref 2.6–24.9)

## 2018-09-19 LAB — VITAMIN D 25 HYDROXY (VIT D DEFICIENCY, FRACTURES): Vit D, 25-Hydroxy: 34.5 ng/mL (ref 30.0–100.0)

## 2018-09-27 ENCOUNTER — Encounter (INDEPENDENT_AMBULATORY_CARE_PROVIDER_SITE_OTHER): Payer: Self-pay | Admitting: Bariatrics

## 2018-09-30 ENCOUNTER — Encounter (INDEPENDENT_AMBULATORY_CARE_PROVIDER_SITE_OTHER): Payer: Self-pay | Admitting: Bariatrics

## 2018-09-30 ENCOUNTER — Ambulatory Visit (INDEPENDENT_AMBULATORY_CARE_PROVIDER_SITE_OTHER): Payer: 59 | Admitting: Bariatrics

## 2018-09-30 ENCOUNTER — Other Ambulatory Visit: Payer: Self-pay

## 2018-09-30 DIAGNOSIS — E8881 Metabolic syndrome: Secondary | ICD-10-CM | POA: Diagnosis not present

## 2018-09-30 DIAGNOSIS — E781 Pure hyperglyceridemia: Secondary | ICD-10-CM | POA: Diagnosis not present

## 2018-09-30 DIAGNOSIS — E559 Vitamin D deficiency, unspecified: Secondary | ICD-10-CM

## 2018-09-30 DIAGNOSIS — E669 Obesity, unspecified: Secondary | ICD-10-CM

## 2018-09-30 DIAGNOSIS — Z6830 Body mass index (BMI) 30.0-30.9, adult: Secondary | ICD-10-CM

## 2018-10-01 NOTE — Progress Notes (Signed)
Office: [phone]  /  Fax: [phone] TeleHealth Visit:  Shane Wood has verbally consented to this TeleHealth visit today. The patient is located at work, the provider is located at the [employer] [facility] office. The participants in this visit include the listed provider and patient and any and all parties involved. The visit was conducted today via FaceTime.  HPI:   Chief Complaint: OBESITY Shane Wood is here to discuss his progress with his obesity treatment plan. He is on the keep a food journal with 1400 to 1700 calories and 100 grams of protein daily plan and is following his eating plan approximately 50 % of the time. He states he is walking and biking for 30 minutes 7 times per week. Shane Wood states that his weight has remained the same (weight 212 lbs). He is doing well with protein and water. We were unable to weigh the patient today for this TeleHealth visit. He feels as if he has maintained weight since his last visit. He has lost 4 lbs since starting treatment with [name].  Hypertriglyceridemia Shane Wood has hypertriglyceridemia and he is taking Lipitor. His last triglyceride level was at 196, which is down from 343. He has been trying to improve his triglyceride levels with intensive lifestyle modification including a low saturated fat diet, exercise and weight loss. He denies any chest pain or myalgias.  Insulin Resistance Shane Wood has a diagnosis of insulin resistance based on his elevated fasting insulin level >5. Although Shane Wood's blood glucose readings are still under good control, insulin resistance puts him at greater risk of metabolic syndrome and diabetes. His last A1c was at 5.6 and last insulin level was at 15.3 Shane Wood is not taking metformin currently and he continues to work on diet and exercise to decrease risk of diabetes.  Vitamin D deficiency Shane Wood has a diagnosis of vitamin D deficiency. His last vitamin D level was at 34.5 He is currently taking vit D  and denies nausea, vomiting or muscle weakness.  ASSESSMENT AND PLAN:  Hypertriglyceridemia  Insulin resistance  Vitamin D deficiency  Class 1 obesity with serious comorbidity and body mass index (BMI) of 30.0 to 30.9 in adult, unspecified obesity type - Starting BMI greater then 30  PLAN:  Hypertriglyceridemia Shane Wood was informed of the American Heart Association Guidelines emphasizing intensive lifestyle modifications as the first line treatment for hypertriglyceridemia. We discussed many lifestyle modifications today in depth, and Shane Wood will continue to work on decreasing saturated fats such as fatty red meat, butter and many fried foods. He will also increase vegetables and lean protein in his diet and continue to work on exercise and weight loss efforts. Shane Wood will continue his medications and follow up as directed.  Insulin Resistance Shane Wood will continue to work on weight loss, exercise, increasing lean protein and decreasing simple carbohydrates in his diet to help decrease the risk of diabetes. He was informed that eating too many simple carbohydrates or too many calories at one sitting increases the likelihood of GI side effects. Shane Wood agreed to follow up with [name] as directed to monitor his progress.  Vitamin D Deficiency Shane Wood was informed that low vitamin D levels contributes to fatigue and are associated with obesity, breast, and colon cancer. He agrees to continue to take prescription Vit D @50 ,000 IU every week and will follow up for routine testing of vitamin D, at least 2-3 times per year. He was informed of the risk of over-replacement of vitamin D and agrees to not increase his dose  unless he discusses this with [name] first.  Obesity Shane Wood is currently in the action stage of change. As such, his goal is to continue with weight loss efforts He will continue to keep a food journal with 1400 to 1700 calories and 100 grams of protein daily (MyFitnessPal) Shane Wood will  continue his exercise regimen and will start resistance bands for weight loss and overall health benefits. We discussed the following Behavioral Modification Strategies today: increase H2O intake, no skipping meals, keeping healthy foods in the home, increasing lean protein intake, decreasing simple carbohydrates, increasing vegetables, decrease eating out and work on meal planning and easy cooking plans  Shane Wood has agreed to follow up with our clinic in 2 weeks. He was informed of the importance of frequent follow up visits to maximize his success with intensive lifestyle modifications for his multiple health conditions.  ALLERGIES: No Known Allergies  MEDICATIONS: Current Outpatient Medications on File Prior to Visit  Medication Sig Dispense Refill  . Ascorbic Acid (VITAMIN C) 1000 MG tablet Take 1,000 mg by mouth daily.    [name] aspirin 81 MG EC tablet TAKE 1 TABLET BY MOUTH EVERY DAY 90 tablet 1  . atorvastatin (LIPITOR) 80 MG tablet TAKE 1 TABLET (80 MG TOTAL) BY MOUTH DAILY AT 6 PM. 90 tablet 1  . BRILINTA 90 MG TABS tablet TAKE 1 TABLET (90 MG TOTAL) BY MOUTH 2 (TWO) TIMES DAILY. 60 tablet 4  . Cyanocobalamin (VITAMIN B 12 PO) Take 1 tablet by mouth daily.    . metoprolol tartrate (LOPRESSOR) 25 MG tablet Take 0.5 tablets (12.5 mg total) by mouth 2 (two) times daily. 60 tablet 11  . Multiple Vitamin (MULTIVITAMIN) tablet Take 1 tablet by mouth daily.      . nitroGLYCERIN (NITROSTAT) 0.4 MG SL tablet Place 1 tablet (0.4 mg total) under the tongue every 5 (five) minutes x 3 doses as needed for chest pain. 25 tablet 11  . omeprazole (PRILOSEC) 20 MG capsule Take 20 mg by mouth daily.    . Vitamin D, Ergocalciferol, (DRISDOL) 1.25 MG (50000 UT) CAPS capsule Take 1 capsule (50,000 Units total) by mouth every 7 (seven) days. 4 capsule 0   No current facility-administered medications on file prior to visit.     PAST MEDICAL HISTORY: Past Medical History:  Diagnosis Date  . Allergy   . BACK  PAIN   . ELEVATED BP READING WITHOUT DX HYPERTENSION 05/03/2008   Qualifier: Diagnosis of  By: Larose Kells MD, [place] GERD (gastroesophageal reflux disease)   . H/O cardiovascular stress test 2005    (-)  . Hyperlipidemia   . INSOMNIA-SLEEP DISORDER-UNSPEC   . PVC (premature ventricular contraction)     PAST SURGICAL HISTORY: Past Surgical History:  Procedure Laterality Date  . ARTERY REPAIR Right 08/23/2017   Procedure: EXPLORATION RIGHT RADIAL ARTERY,  RIGHT FOREARM FASCIOTOMY;  Surgeon: Elam Dutch, MD;  Location: [facility];  Service: Vascular;  Laterality: Right;  . CHOLECYSTECTOMY    . CORONARY STENT INTERVENTION N/A 08/23/2017   Procedure: CORONARY STENT INTERVENTION;  Surgeon: Jettie Booze, MD;  Location: [facility];  Service: Cardiovascular;  Laterality: N/A;  . LEFT HEART CATH AND CORONARY ANGIOGRAPHY N/A 08/23/2017   Procedure: LEFT HEART CATH AND CORONARY ANGIOGRAPHY;  Surgeon: Jettie Booze, MD;  Location: [facility];  Service: Cardiovascular;  Laterality: N/A;  . POLYPECTOMY    . TONSILLECTOMY     age 63 y/o    SOCIAL HISTORY:  Social History   Tobacco Use  . Smoking status: Former Smoker    Last attempt to quit: 09/24/1988    Years since quitting: 30.0  . Smokeless tobacco: Former Systems developer    Types: Chew    Quit date: 1970  Substance Use Topics  . Alcohol use: Yes    Alcohol/week: 4.0 standard drinks    Types: 4 Glasses of wine per week    Comment: occasionally on weekends  . Drug use: No    FAMILY HISTORY: Family History  Problem Relation Age of Onset  . Lung cancer Father        smoker  . Diabetes Maternal Grandmother   . Diabetes Maternal Grandfather   . Hypertension Mother   . Depression Mother   . Obesity Mother   . Heart attack Other        uncle MI at age 44s  . Stroke Other        GM, in her 33  . Colon cancer Neg Hx   . Prostate cancer Neg Hx   . Colon polyps Neg Hx   . Esophageal cancer Neg Hx   . Rectal cancer Neg  Hx   . Stomach cancer Neg Hx   . Pancreatic cancer Neg Hx     ROS: Review of Systems  Constitutional: Negative for weight loss.  Cardiovascular: Negative for chest pain.  Gastrointestinal: Negative for nausea and vomiting.  Musculoskeletal: Negative for myalgias.       Negative for muscle weakness    PHYSICAL EXAM: Pt in no acute distress  RECENT LABS AND TESTS: BMET    Component Value Date/Time   NA 139 09/18/2018 0801   K 4.6 09/18/2018 0801   CL 104 09/18/2018 0801   CO2 22 09/18/2018 0801   GLUCOSE 99 09/18/2018 0801   GLUCOSE 149 (H) 08/24/2017 0257   BUN 17 09/18/2018 0801   CREATININE 1.06 09/18/2018 0801   CALCIUM 9.2 09/18/2018 0801   GFRNONAA 74 09/18/2018 0801   GFRAA 86 09/18/2018 0801   Lab Results  Component Value Date   HGBA1C 5.6 09/18/2018   HGBA1C 5.7 (H) 03/04/2018   HGBA1C 5.5 11/26/2017   HGBA1C 5.7 (H) 08/08/2017   Lab Results  Component Value Date   INSULIN 15.3 09/18/2018   INSULIN 17.8 03/04/2018   INSULIN 8.7 11/26/2017   INSULIN 11.8 08/08/2017   CBC    Component Value Date/Time   WBC 12.1 (H) 08/24/2017 0257   RBC 3.93 (L) 08/24/2017 0257   HGB 11.0 (L) 08/24/2017 0257   HCT 33.6 (L) 08/24/2017 0257   PLT 204 08/24/2017 0257   MCV 85.5 08/24/2017 0257   MCH 28.0 08/24/2017 0257   MCHC 32.7 08/24/2017 0257   RDW 14.8 08/24/2017 0257   LYMPHSABS 1.7 07/15/2017 0828   MONOABS 0.4 07/15/2017 0828   EOSABS 0.2 07/15/2017 0828   BASOSABS 0.0 07/15/2017 0828   Iron/TIBC/Ferritin/ %Sat No results found for: IRON, TIBC, FERRITIN, IRONPCTSAT Lipid Panel     Component Value Date/Time   CHOL 174 09/18/2018 0801   TRIG 196 (H) 09/18/2018 0801   HDL 39 (L) 09/18/2018 0801   CHOLHDL 3.8 10/04/2017 0802   CHOLHDL 4 07/15/2017 0828   VLDL 41.0 (H) 07/15/2017 0828   LDLCALC 96 09/18/2018 0801   LDLDIRECT 105.0 07/15/2017 0828   Hepatic Function Panel     Component Value Date/Time   PROT 6.5 09/18/2018 0801   ALBUMIN 4.4  09/18/2018 0801   AST 22 09/18/2018 0801  ALT 34 09/18/2018 0801   ALKPHOS 51 09/18/2018 0801   BILITOT 0.4 09/18/2018 0801   BILIDIR 0.11 10/04/2017 0802      Component Value Date/Time   TSH 2.080 08/08/2017 1016   TSH 2.14 07/15/2017 0828   TSH 1.62 07/08/2015 1405     Ref. Range 09/18/2018 08:01  Vitamin D, 25-Hydroxy Latest Ref Range: 30.0 - 100.0 ng/mL 34.5    I, Doreene Nest, am acting as Location manager for [employer]. Owens Shark, DO  I have reviewed the above documentation for accuracy and completeness, and I agree with the above. -Jearld Lesch, DO

## 2018-10-09 ENCOUNTER — Other Ambulatory Visit (INDEPENDENT_AMBULATORY_CARE_PROVIDER_SITE_OTHER): Payer: Self-pay | Admitting: Bariatrics

## 2018-10-09 DIAGNOSIS — E559 Vitamin D deficiency, unspecified: Secondary | ICD-10-CM

## 2018-10-15 ENCOUNTER — Encounter (INDEPENDENT_AMBULATORY_CARE_PROVIDER_SITE_OTHER): Payer: Self-pay | Admitting: Bariatrics

## 2018-10-15 ENCOUNTER — Ambulatory Visit (INDEPENDENT_AMBULATORY_CARE_PROVIDER_SITE_OTHER): Payer: 59 | Admitting: Bariatrics

## 2018-10-15 ENCOUNTER — Other Ambulatory Visit: Payer: Self-pay

## 2018-10-15 VITALS — BP 118/71 | HR 56 | Temp 98.6°F | Ht 71.0 in | Wt 213.0 lb

## 2018-10-15 DIAGNOSIS — E559 Vitamin D deficiency, unspecified: Secondary | ICD-10-CM

## 2018-10-15 DIAGNOSIS — Z9189 Other specified personal risk factors, not elsewhere classified: Secondary | ICD-10-CM | POA: Diagnosis not present

## 2018-10-15 DIAGNOSIS — E785 Hyperlipidemia, unspecified: Secondary | ICD-10-CM

## 2018-10-15 DIAGNOSIS — E8881 Metabolic syndrome: Secondary | ICD-10-CM

## 2018-10-15 DIAGNOSIS — E669 Obesity, unspecified: Secondary | ICD-10-CM

## 2018-10-15 DIAGNOSIS — Z6830 Body mass index (BMI) 30.0-30.9, adult: Secondary | ICD-10-CM

## 2018-10-15 MED ORDER — VITAMIN D (ERGOCALCIFEROL) 1.25 MG (50000 UNIT) PO CAPS: 50000.0000 [IU] | ORAL_CAPSULE | ORAL | 0 refills | Status: DC

## 2018-10-16 NOTE — Progress Notes (Signed)
Office: [phone]  /  Fax: [phone]   HPI:   Chief Complaint: OBESITY Shane Wood is here to discuss his progress with his obesity treatment plan. He is on the keep a food journal with 1400 to 1700 calories and 100 grams of protein daily and is following his eating plan approximately 25 to 40 % of the time. He states he is walking and biking 20 to 60 minutes 5 to 6 times per week. Shane Wood is down 1 pound. His goal is 190 to 200 pounds. He is doing well with sweets and he is getting adequate water. His weight is 213 lb (96.6 kg) today and has had a weight loss of 1 pound over a period of 2 weeks since his last visit. He has lost 3 lbs since starting treatment with [name].  Elevated Lipids Shane Wood has elevated lipids. His triglycerides are elevated at 196 (09/18/18) and LDL is at 96 (09/18/18). He has been trying to improve his cholesterol levels with intensive lifestyle modification including a low saturated fat diet, exercise and weight loss. He denies myalgias.  At risk for cardiovascular disease Shane Wood is at a higher than average risk for cardiovascular disease due to obesity and elevated lipids. He currently denies any chest pain.  Insulin Resistance Shane Wood has a diagnosis of insulin resistance based on his elevated fasting insulin level >5. Although Shane Wood's blood glucose readings are still under good control, insulin resistance puts him at greater risk of metabolic syndrome and diabetes. His last A1c was at 5.6 and last insulin level was at 15.3 Shane Wood is not on medications and he continues to work on diet and exercise to decrease risk of diabetes.  Vitamin D deficiency Shane Wood has a diagnosis of vitamin D deficiency. His last vitamin D level was at 34.5 Shane Wood is currently taking vit D and he denies nausea, vomiting or muscle weakness.  ASSESSMENT AND PLAN:  Elevated lipids  Insulin resistance  Vitamin D deficiency - Plan: Vitamin D, Ergocalciferol, (DRISDOL) 1.25 MG (50000  UT) CAPS capsule  At risk for heart disease  Class 1 obesity with serious comorbidity and body mass index (BMI) of 30.0 to 30.9 in adult, unspecified obesity type - BMI greater than  30 at start of program  PLAN:  Elevated Lipids Shane Wood was informed of the American Heart Association Guidelines emphasizing intensive lifestyle modifications as the first line treatment for elevated lipids. We discussed many lifestyle modifications today in depth, and Shane Wood will continue to work on decreasing saturated fats such as fatty red meat, butter and many fried foods. He will also increase MUFA's, PUFA's, vegetables and lean protein in his diet and continue to work on exercise and weight loss efforts.  Cardiovascular risk counseling Shane Wood was given extended (15 minutes) coronary artery disease prevention counseling today. He is 63 y.o. male and has risk factors for heart disease including obesity and elevated lipids. We discussed intensive lifestyle modifications today with an emphasis on specific weight loss instructions and strategies. Pt was also informed of the importance of increasing exercise and decreasing saturated fats to help prevent heart disease.  Insulin Resistance Shane Wood will continue to work on weight loss, exercise, increasing lean protein and decreasing simple carbohydrates in his diet to help decrease the risk of diabetes. He was informed that eating too many simple carbohydrates or too many calories at one sitting increases the likelihood of GI side effects. Shane Wood agreed to follow up with [name] as directed to monitor his progress.  Vitamin D Deficiency Shane Wood was  informed that low vitamin D levels contributes to fatigue and are associated with obesity, breast, and colon cancer. He agrees to continue to take prescription Vit D @50 ,000 IU every week #4 with no refills and will follow up for routine testing of vitamin D, at least 2-3 times per year. He was informed of the risk of  over-replacement of vitamin D and agrees to not increase his dose unless he discusses this with [name] first. Shane Wood agrees to follow up as directed.  Obesity Shane Wood is currently in the action stage of change. As such, his goal is to continue with weight loss efforts He has agreed to keep a food journal with 1400 to 1700 calories and 100 grams of protein daily Shane Wood will continue exercise, and has added resistance bands for weight loss and overall health benefits. We discussed the following Behavioral Modification Strategies today: increase H2O intake, no skipping meals, keeping healthy foods in the home, increasing lean protein intake, decreasing simple carbohydrates, increasing vegetables, decrease eating out and work on meal planning and intentional eating Information on the Mediterranean diet was given to patient today. Handouts for additional breakfast, lunch and dinner were provided to patient today.  Shane Wood has agreed to follow up with our clinic in 2 weeks. He was informed of the importance of frequent follow up visits to maximize his success with intensive lifestyle modifications for his multiple health conditions.  ALLERGIES: No Known Allergies  MEDICATIONS: Current Outpatient Medications on File Prior to Visit  Medication Sig Dispense Refill  . Ascorbic Acid (VITAMIN C) 1000 MG tablet Take 1,000 mg by mouth daily.    [name] aspirin 81 MG EC tablet TAKE 1 TABLET BY MOUTH EVERY DAY 90 tablet 1  . atorvastatin (LIPITOR) 80 MG tablet TAKE 1 TABLET (80 MG TOTAL) BY MOUTH DAILY AT 6 PM. 90 tablet 1  . BRILINTA 90 MG TABS tablet TAKE 1 TABLET (90 MG TOTAL) BY MOUTH 2 (TWO) TIMES DAILY. 60 tablet 4  . Cyanocobalamin (VITAMIN B 12 PO) Take 1 tablet by mouth daily.    . metoprolol tartrate (LOPRESSOR) 25 MG tablet Take 0.5 tablets (12.5 mg total) by mouth 2 (two) times daily. 60 tablet 11  . Multiple Vitamin (MULTIVITAMIN) tablet Take 1 tablet by mouth daily.      . nitroGLYCERIN (NITROSTAT)  0.4 MG SL tablet Place 1 tablet (0.4 mg total) under the tongue every 5 (five) minutes x 3 doses as needed for chest pain. 25 tablet 11  . omeprazole (PRILOSEC) 20 MG capsule Take 20 mg by mouth daily.     No current facility-administered medications on file prior to visit.     PAST MEDICAL HISTORY: Past Medical History:  Diagnosis Date  . Allergy   . BACK PAIN   . ELEVATED BP READING WITHOUT DX HYPERTENSION 05/03/2008   Qualifier: Diagnosis of  By: Larose Kells MD, [place] GERD (gastroesophageal reflux disease)   . H/O cardiovascular stress test 2005    (-)  . Hyperlipidemia   . INSOMNIA-SLEEP DISORDER-UNSPEC   . PVC (premature ventricular contraction)     PAST SURGICAL HISTORY: Past Surgical History:  Procedure Laterality Date  . ARTERY REPAIR Right 08/23/2017   Procedure: EXPLORATION RIGHT RADIAL ARTERY,  RIGHT FOREARM FASCIOTOMY;  Surgeon: Elam Dutch, MD;  Location: [facility] OR;  Service: Vascular;  Laterality: Right;  . CHOLECYSTECTOMY    . CORONARY STENT INTERVENTION N/A 08/23/2017   Procedure: CORONARY STENT INTERVENTION;  Surgeon: Jettie Booze, MD;  Location:  [facility];  Service: Cardiovascular;  Laterality: N/A;  . LEFT HEART CATH AND CORONARY ANGIOGRAPHY N/A 08/23/2017   Procedure: LEFT HEART CATH AND CORONARY ANGIOGRAPHY;  Surgeon: Jettie Booze, MD;  Location: [place] [facility];  Service: Cardiovascular;  Laterality: N/A;  . POLYPECTOMY    . TONSILLECTOMY     age 30 y/o    SOCIAL HISTORY: Social History   Tobacco Use  . Smoking status: Former Smoker    Quit date: 09/24/1988    Years since quitting: 30.0  . Smokeless tobacco: Former Systems developer    Types: Chew    Quit date: 1970  Substance Use Topics  . Alcohol use: Yes    Alcohol/week: 4.0 standard drinks    Types: 4 Glasses of wine per week    Comment: occasionally on weekends  . Drug use: No    FAMILY HISTORY: Family History  Problem Relation Age of Onset  . Lung cancer Father        smoker   . Diabetes Maternal Grandmother   . Diabetes Maternal Grandfather   . Hypertension Mother   . Depression Mother   . Obesity Mother   . Heart attack Other        uncle MI at age 35s  . Stroke Other        GM, in her 3  . Colon cancer Neg Hx   . Prostate cancer Neg Hx   . Colon polyps Neg Hx   . Esophageal cancer Neg Hx   . Rectal cancer Neg Hx   . Stomach cancer Neg Hx   . Pancreatic cancer Neg Hx     ROS: Review of Systems  Constitutional: Positive for weight loss.  Cardiovascular: Negative for chest pain.  Gastrointestinal: Negative for nausea and vomiting.  Musculoskeletal: Negative for myalgias.       Negative for muscle weakness    PHYSICAL EXAM: Blood pressure 118/71, pulse (!) 56, temperature 98.6 F (37 C), temperature source Oral, height 5\' 11"  (1.803 m), weight 213 lb (96.6 kg), SpO2 97 %. Body mass index is 29.71 kg/m. Physical Exam Vitals signs reviewed.  Constitutional:      Appearance: Normal appearance. He is well-developed. He is obese.  Cardiovascular:     Rate and Rhythm: Normal rate.  Pulmonary:     Effort: Pulmonary effort is normal.  Musculoskeletal: Normal range of motion.  Skin:    General: Skin is warm and dry.  Neurological:     Mental Status: He is alert and oriented to person, place, and time.  Psychiatric:        Mood and Affect: Mood normal.        Behavior: Behavior normal.     RECENT LABS AND TESTS: BMET    Component Value Date/Time   NA 139 09/18/2018 0801   K 4.6 09/18/2018 0801   CL 104 09/18/2018 0801   CO2 22 09/18/2018 0801   GLUCOSE 99 09/18/2018 0801   GLUCOSE 149 (H) 08/24/2017 0257   BUN 17 09/18/2018 0801   CREATININE 1.06 09/18/2018 0801   CALCIUM 9.2 09/18/2018 0801   GFRNONAA 74 09/18/2018 0801   GFRAA 86 09/18/2018 0801   Lab Results  Component Value Date   HGBA1C 5.6 09/18/2018   HGBA1C 5.7 (H) 03/04/2018   HGBA1C 5.5 11/26/2017   HGBA1C 5.7 (H) 08/08/2017   Lab Results  Component Value Date    INSULIN 15.3 09/18/2018   INSULIN 17.8 03/04/2018   INSULIN 8.7 11/26/2017  INSULIN 11.8 08/08/2017   CBC    Component Value Date/Time   WBC 12.1 (H) 08/24/2017 0257   RBC 3.93 (L) 08/24/2017 0257   HGB 11.0 (L) 08/24/2017 0257   HCT 33.6 (L) 08/24/2017 0257   PLT 204 08/24/2017 0257   MCV 85.5 08/24/2017 0257   MCH 28.0 08/24/2017 0257   MCHC 32.7 08/24/2017 0257   RDW 14.8 08/24/2017 0257   LYMPHSABS 1.7 07/15/2017 0828   MONOABS 0.4 07/15/2017 0828   EOSABS 0.2 07/15/2017 0828   BASOSABS 0.0 07/15/2017 0828   Iron/TIBC/Ferritin/ %Sat No results found for: IRON, TIBC, FERRITIN, IRONPCTSAT Lipid Panel     Component Value Date/Time   CHOL 174 09/18/2018 0801   TRIG 196 (H) 09/18/2018 0801   HDL 39 (L) 09/18/2018 0801   CHOLHDL 3.8 10/04/2017 0802   CHOLHDL 4 07/15/2017 0828   VLDL 41.0 (H) 07/15/2017 0828   LDLCALC 96 09/18/2018 0801   LDLDIRECT 105.0 07/15/2017 0828   Hepatic Function Panel     Component Value Date/Time   PROT 6.5 09/18/2018 0801   ALBUMIN 4.4 09/18/2018 0801   AST 22 09/18/2018 0801   ALT 34 09/18/2018 0801   ALKPHOS 51 09/18/2018 0801   BILITOT 0.4 09/18/2018 0801   BILIDIR 0.11 10/04/2017 0802      Component Value Date/Time   TSH 2.080 08/08/2017 1016   TSH 2.14 07/15/2017 0828   TSH 1.62 07/08/2015 1405     Ref. Range 09/18/2018 08:01  Vitamin D, 25-Hydroxy Latest Ref Range: 30.0 - 100.0 ng/mL 34.5    OBESITY BEHAVIORAL INTERVENTION VISIT  Today's visit was # 17   Starting weight: 216 lbs Starting date: 08/08/2017 Today's weight : 213 lbs Today's date: 10/15/2018 Total lbs lost to date: 3    10/15/2018  Height 5\' 11"  (1.803 m)  Weight 213 lb (96.6 kg)  BMI (Calculated) 29.72  BLOOD PRESSURE - SYSTOLIC 655  BLOOD PRESSURE - DIASTOLIC 71   Body Fat % 37.4 %  Total Body Water (lbs) 103.2 lbs    ASK: We discussed the diagnosis of obesity with [name] today and Gwin agreed to give [name] permission to discuss obesity  behavioral modification therapy today.  ASSESS: Aarush has the diagnosis of obesity and his BMI today is 29.72 [name] is in the action stage of change   ADVISE: Samad was educated on the multiple health risks of obesity as well as the benefit of weight loss to improve his health. He was advised of the need for long term treatment and the importance of lifestyle modifications to improve his current health and to decrease his risk of future health problems.  AGREE: Multiple dietary modification options and treatment options were discussed and  Jacobey agreed to follow the recommendations documented in the above note.  ARRANGE: Renzo was educated on the importance of frequent visits to treat obesity as outlined per CMS and USPSTF guidelines and agreed to schedule his next follow up appointment today.  Corey Skains, am acting as Location manager for [employer]. Owens Shark, DO  I have reviewed the above documentation for accuracy and completeness, and I agree with the above. -Jearld Lesch, DO

## 2018-10-21 ENCOUNTER — Other Ambulatory Visit: Payer: Self-pay | Admitting: Physician Assistant

## 2018-10-21 NOTE — Telephone Encounter (Signed)
Refill request

## 2018-10-31 ENCOUNTER — Telehealth: Payer: Self-pay | Admitting: Interventional Cardiology

## 2018-10-31 ENCOUNTER — Encounter: Payer: Self-pay | Admitting: Interventional Cardiology

## 2018-10-31 NOTE — Telephone Encounter (Signed)

## 2018-11-02 NOTE — Progress Notes (Signed)
Virtual Visit via Video Note   This visit type was conducted due to national recommendations for restrictions regarding the COVID-19 Pandemic (e.g. social distancing) in an effort to limit this patient's exposure and mitigate transmission in our community.  Due to his co-morbid illnesses, this patient is at least at moderate risk for complications without adequate follow up.  This format is felt to be most appropriate for this patient at this time.  All issues noted in this document were discussed and addressed.  A limited physical exam was performed with this format.  Please refer to the patient's chart for his consent to telehealth for [facility].   Started with video, but changed to phonecall due to audio issues.   Date:  11/03/2018   ID:  Shane Wood, DOB [birth year], MRN [id number]  Patient Location: Home Provider Location: Home  PCP:  Colon Branch, MD  Cardiologist:  Larae Grooms, MD  Electrophysiologist:  None   Evaluation Performed:  Follow-Up Visit  Chief Complaint:  CAD  History of Present Illness:    Shane Wood is a 63 y.o. 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 He was admitted to [facility] with NSTEMI 08/23/2017 and underwent cardiac catheterization with DES to the proximal to mid RCA and ostial PDA, residual 50% proximal LAD, 80% circumflex, total OM1 normal LVEF 50 to 55% with mild aortic stenosis.Patient developed compartment syndrome of the right forearm post procedure and underwent exploration of the right forearm. Radial artery was identified and intact median nerve and ulnar neurovascular bundle intact. Wound was irrigated and loosely closed.Follow-up with vascular surgery of as well as Dr. Durward Fortes that arm had healed well.Also has history of PVCs and HLD.  He thinks he had a lot of stress in the months before the MI. MI sx were pressure and a  deep ache.   Immediately after discharge, he had some chest twinges.  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  He has continued to do this during the Pandemic.   Went to [place] for vacation.  He was active.  Walking, fishing etc...  Did well.  Wears a mask.    The patient does not have symptoms concerning for COVID-19 infection (fever, chills, cough, or new shortness of breath).   Denies : Chest pain. Dizziness. Leg edema. Nitroglycerin use. Orthopnea. Palpitations. Paroxysmal nocturnal dyspnea. Shortness of breath. Syncope.   Going to weight management center.     Past Medical History:  Diagnosis Date  . Allergy   . BACK PAIN   . ELEVATED BP READING WITHOUT DX HYPERTENSION 05/03/2008   Qualifier: Diagnosis of  By: Larose Kells MD, [place] GERD (gastroesophageal reflux disease)   . H/O cardiovascular stress test 2005    (-)  . Hyperlipidemia   . INSOMNIA-SLEEP DISORDER-UNSPEC   . PVC (premature ventricular contraction)    Past Surgical History:  Procedure Laterality Date  . ARTERY REPAIR Right 08/23/2017   Procedure: EXPLORATION RIGHT RADIAL ARTERY,  RIGHT FOREARM FASCIOTOMY;  Surgeon: Elam Dutch, MD;  Location: [facility] OR;  Service: Vascular;  Laterality: Right;  . CHOLECYSTECTOMY    . CORONARY STENT INTERVENTION N/A 08/23/2017   Procedure: CORONARY STENT INTERVENTION;  Surgeon: Jettie Booze, MD;  Location: [place];  Service: Cardiovascular;  Laterality: N/A;  . LEFT HEART CATH AND CORONARY ANGIOGRAPHY N/A 08/23/2017   Procedure: LEFT HEART  CATH AND CORONARY ANGIOGRAPHY;  Surgeon: Jettie Booze, MD;  Location: [place] [facility];  Service: Cardiovascular;  Laterality: N/A;  . POLYPECTOMY    . TONSILLECTOMY     age 76 y/o     Current Meds  Medication Sig  . Ascorbic Acid (VITAMIN C) 1000 MG tablet Take 1,000 mg by mouth daily.  [name] aspirin 81 MG EC tablet TAKE 1 TABLET BY MOUTH EVERY DAY  . atorvastatin (LIPITOR) 80 MG tablet TAKE 1 TABLET (80 MG TOTAL) BY MOUTH  DAILY AT 6 PM.  . BRILINTA 90 MG TABS tablet TAKE 1 TABLET (90 MG TOTAL) BY MOUTH 2 (TWO) TIMES DAILY.  [name] Cyanocobalamin (VITAMIN B 12 PO) Take 1 tablet by mouth daily.  . metoprolol tartrate (LOPRESSOR) 25 MG tablet TAKE HALF A TABLET (12.5 MG TOTAL) BY MOUTH 2 (TWO) TIMES DAILY.  . Multiple Vitamin (MULTIVITAMIN) tablet Take 1 tablet by mouth daily.    . nitroGLYCERIN (NITROSTAT) 0.4 MG SL tablet Place 1 tablet (0.4 mg total) under the tongue every 5 (five) minutes x 3 doses as needed for chest pain.  [name] omeprazole (PRILOSEC) 20 MG capsule Take 20 mg by mouth daily.  . Vitamin D, Ergocalciferol, (DRISDOL) 1.25 MG (50000 UT) CAPS capsule Take 1 capsule (50,000 Units total) by mouth every 7 (seven) days.     Allergies:   Patient has no known allergies.   Social History   Tobacco Use  . Smoking status: Former Smoker    Quit date: 09/24/1988    Years since quitting: 30.1  . Smokeless tobacco: Former Systems developer    Types: Chew    Quit date: 1970  Substance Use Topics  . Alcohol use: Yes    Alcohol/week: 4.0 standard drinks    Types: 4 Glasses of wine per week    Comment: occasionally on weekends  . Drug use: No     Family Hx: The patient's family history includes Depression in his mother; Diabetes in his maternal grandfather and maternal grandmother; Heart attack in an other family member; Hypertension in his mother; Lung cancer in his father; Obesity in his mother; Stroke in an other family member. There is no history of Colon cancer, Prostate cancer, Colon polyps, Esophageal cancer, Rectal cancer, Stomach cancer, or Pancreatic cancer.  ROS:   Please see the history of present illness.    Work is essential so he has ben going to the office several times a week.   All other systems reviewed and are negative.   Prior CV studies:   The following studies were reviewed today:  LDL 96 in 08/2018  Labs/Other Tests and Data Reviewed:    EKG:  An ECG dated 2019 was personally reviewed today  and demonstrated:  NSR, inferior Q waves  Recent Labs: 09/18/2018: ALT 34; BUN 17; Creatinine, Ser 1.06; Potassium 4.6; Sodium 139   Recent Lipid Panel Lab Results  Component Value Date/Time   CHOL 174 09/18/2018 08:01 AM   TRIG 196 (H) 09/18/2018 08:01 AM   HDL 39 (L) 09/18/2018 08:01 AM   CHOLHDL 3.8 10/04/2017 08:02 AM   CHOLHDL 4 07/15/2017 08:28 AM   LDLCALC 96 09/18/2018 08:01 AM   LDLDIRECT 105.0 07/15/2017 08:28 AM    Wt Readings from Last 3 Encounters:  11/03/18 213 lb (96.6 kg)  10/15/18 213 lb (96.6 kg)  06/17/18 214 lb (97.1 kg)     Objective:    Vital Signs:  Pulse 69   Ht 5\' 11"  (1.803 m)  Wt 213 lb (96.6 kg)   BMI 29.71 kg/m    VITAL SIGNS:  reviewed RESPIRATORY:  normal respiratory effort, symmetric expansion PSYCH:  normal affect exam limited  ASSESSMENT & PLAN:    1. CAD/Old MI: No angina.  Stop Brilinta once he runs out of his supply.  Start Clopidogrel 75 mg daily after that.  Can stop aspirin.  No CHF sx.  Exercises 5x/week. 2. Hyperlipidemia:  LDL above target of 70.  Add Zetia 10 mg daily.  Recheck lipids in 3 months.  3. PVC: Not bothersome at this time.  4. Elevated BP in the past.  BP checked at weight management center and was in the 110 range.    COVID-19 Education: The signs and symptoms of COVID-19 were discussed with the patient and how to seek care for testing (follow up with PCP or arrange E-visit).  The importance of social distancing was discussed today.  Time:   Today, I have spent 25 minutes with the patient with telehealth technology discussing the above problems.     Medication Adjustments/Labs and Tests Ordered: Current medicines are reviewed at length with the patient today.  Concerns regarding medicines are outlined above.   Tests Ordered: No orders of the defined types were placed in this encounter.   Medication Changes: No orders of the defined types were placed in this encounter.   Follow Up:  Virtual Visit or  In Person in 6 month(s)  Signed, Larae Grooms, MD  11/03/2018 9:33 AM    [place]

## 2018-11-03 ENCOUNTER — Other Ambulatory Visit: Payer: Self-pay

## 2018-11-03 ENCOUNTER — Other Ambulatory Visit: Payer: Self-pay | Admitting: Interventional Cardiology

## 2018-11-03 ENCOUNTER — Encounter: Payer: Self-pay | Admitting: Interventional Cardiology

## 2018-11-03 ENCOUNTER — Telehealth (INDEPENDENT_AMBULATORY_CARE_PROVIDER_SITE_OTHER): Payer: 59 | Admitting: Interventional Cardiology

## 2018-11-03 VITALS — HR 69 | Ht 71.0 in | Wt 213.0 lb

## 2018-11-03 DIAGNOSIS — E782 Mixed hyperlipidemia: Secondary | ICD-10-CM | POA: Diagnosis not present

## 2018-11-03 DIAGNOSIS — I252 Old myocardial infarction: Secondary | ICD-10-CM

## 2018-11-03 DIAGNOSIS — I25118 Atherosclerotic heart disease of native coronary artery with other forms of angina pectoris: Secondary | ICD-10-CM

## 2018-11-03 MED ORDER — CLOPIDOGREL BISULFATE 75 MG PO TABS: ORAL_TABLET | ORAL | 3 refills | Status: DC

## 2018-11-03 MED ORDER — PANTOPRAZOLE SODIUM 40 MG PO TBEC: 40.0000 mg | DELAYED_RELEASE_TABLET | Freq: Every day | ORAL | 3 refills | Status: DC

## 2018-11-03 MED ORDER — EZETIMIBE 10 MG PO TABS: 10.0000 mg | ORAL_TABLET | Freq: Every day | ORAL | 3 refills | Status: DC

## 2018-11-03 NOTE — Patient Instructions (Addendum)
Medication Instructions:  Your physician has recommended you make the following change in your medication:   1. STOP: aspirin  2. START: ezetimibe (zetia) 10 mg tablet: Take 1 tablet by mouth once a day  3. FINISH you current supply of ticagrelor (Brilinta), then start clopidogrel (plavix) 75 mg tablet. On DAY 1 ONLY: take 4 tablets (300 mg total) by mouth once, the starting Day 2 and everyday thereafter, take 1 tablet (75 mg total) by mouth once a day  4. Once you start clopidogrel (plavix) you will need to STOP: omeprazole (prilosec) and start pantoprazole (protonix) 40 mg tablet once a day  Lab work: Your physician recommends that you return for a FASTING lipid profile on 02/04/19 at 7:45 AM   If you have labs (blood work) drawn today and your tests are completely normal, you will receive your results only by: [name] MyChart Message (if you have MyChart) OR . A paper copy in the mail If you have any lab test that is abnormal or we need to change your treatment, we will call you to review the results.  Testing/Procedures: None ordered  Follow-Up: At [facility], you and your health needs are our priority.  As part of our continuing mission to provide you with exceptional heart care, we have created designated Provider Care Teams.  These Care Teams include your primary Cardiologist (physician) and Advanced Practice Providers (APPs -  Physician Assistants and Nurse Practitioners) who all work together to provide you with the care you need, when you need it. . You will need a follow up appointment in 6 months.  Please call our office 2 months in advance to schedule this appointment.  You may see Casandra Doffing, MD or one of the following Advanced Practice Providers on your designated Care Team:   . Lyda Jester, PA-C . Dayna Dunn, PA-C . Ermalinda Barrios, PA-C  Any Other Special Instructions Will Be Listed Below (If Applicable).

## 2018-11-05 ENCOUNTER — Ambulatory Visit (INDEPENDENT_AMBULATORY_CARE_PROVIDER_SITE_OTHER): Payer: 59 | Admitting: Bariatrics

## 2018-11-05 ENCOUNTER — Encounter (INDEPENDENT_AMBULATORY_CARE_PROVIDER_SITE_OTHER): Payer: Self-pay | Admitting: Bariatrics

## 2018-11-05 ENCOUNTER — Other Ambulatory Visit: Payer: Self-pay

## 2018-11-05 VITALS — BP 125/78 | HR 60 | Temp 98.7°F | Ht 71.0 in | Wt 215.0 lb

## 2018-11-05 DIAGNOSIS — Z6830 Body mass index (BMI) 30.0-30.9, adult: Secondary | ICD-10-CM

## 2018-11-05 DIAGNOSIS — Z9189 Other specified personal risk factors, not elsewhere classified: Secondary | ICD-10-CM | POA: Diagnosis not present

## 2018-11-05 DIAGNOSIS — E669 Obesity, unspecified: Secondary | ICD-10-CM | POA: Diagnosis not present

## 2018-11-05 DIAGNOSIS — E559 Vitamin D deficiency, unspecified: Secondary | ICD-10-CM

## 2018-11-05 DIAGNOSIS — E8881 Metabolic syndrome: Secondary | ICD-10-CM

## 2018-11-05 MED ORDER — VITAMIN D (ERGOCALCIFEROL) 1.25 MG (50000 UNIT) PO CAPS: 50000.0000 [IU] | ORAL_CAPSULE | ORAL | 0 refills | Status: DC

## 2018-11-05 NOTE — Progress Notes (Signed)
Office: [phone]  /  Fax: [phone]   HPI:   Chief Complaint: OBESITY Shane Wood is here to discuss his progress with his obesity treatment plan. He is on the keep a food journal with 1400 to 1700 calories and 100 grams of protein daily plan and is following his eating plan approximately 50 % of the time. He states he is walking and biking 30 to 45 minutes 5 times per week. [name] has gained two pounds. He has not gotten enough exercise. [name] is doing well with protein. He is doing some stress eating. His weight is 215 lb (97.5 kg) today and has had a weight gain of 2 pounds over a period of 3 weeks since his last visit. He has lost 1 lb since starting treatment with [name].  Vitamin D deficiency [name] has a diagnosis of vitamin D deficiency. Her last vitamin D level was at 34.5 He is currently taking vit D and denies nausea, vomiting or muscle weakness.  At risk for osteopenia and osteoporosis [name] is at higher risk of osteopenia and osteoporosis due to vitamin D deficiency.   Insulin Resistance [name] has a diagnosis of insulin resistance based on his elevated fasting insulin level >5. Although [name]'s blood glucose readings are still under good control, insulin resistance puts him at greater risk of metabolic syndrome and diabetes. His last A1c was at 5.6 and last insulin level was at 15.3 [name] is not on medications currently and he continues to work on diet and exercise to decrease risk of diabetes.  ASSESSMENT AND PLAN:  Vitamin D deficiency - Plan: Vitamin D, Ergocalciferol, (DRISDOL) 1.25 MG (50000 UT) CAPS capsule  Insulin resistance  At risk for osteoporosis  Class 1 obesity with serious comorbidity and body mass index (BMI) of 30.0 to 30.9 in adult, unspecified obesity type  PLAN:  Vitamin D Deficiency [name] was informed that low vitamin D levels contributes to fatigue and are associated with obesity, breast, and colon cancer. He agrees to continue to  take prescription Vit D @50 ,000 IU every week #4 with no refills and will follow up for routine testing of vitamin D, at least 2-3 times per year. He was informed of the risk of over-replacement of vitamin D and agrees to not increase his dose unless he discusses this with [name] first. [name] agrees to follow up with our clinic in 2 weeks.  At risk for osteopenia and osteoporosis [name] was given extended  (15 minutes) osteoporosis prevention counseling today. [name] is at risk for osteopenia and osteoporosis due to his vitamin D deficiency. He was encouraged to take his vitamin D and follow his higher calcium diet and increase strengthening exercise to help strengthen his bones and decrease his risk of osteopenia and osteoporosis.  Insulin Resistance Loris will continue to work on weight loss, exercise, increasing lean protein and decreasing simple carbohydrates in his diet to help decrease the risk of diabetes. He was informed that eating too many simple carbohydrates or too many calories at one sitting increases the likelihood of GI side effects. Matisse agreed to follow up with [name] as directed to monitor his progress.  Obesity [name] is currently in the action stage of change. As such, his goal is to continue with weight loss efforts He has agreed to keep a food journal with 1400 to 1700 calories and 100 grams of protein daily [name] has been instructed to work up to a goal of 150 minutes of combined cardio and strengthening exercise per week for weight  loss and overall health benefits. We discussed the following Behavioral Modification Strategies today: planning for success, increase H2O intake, no skipping meals, keeping healthy foods in the home, increasing lean protein intake, decreasing simple carbohydrates, increasing vegetables, decrease eating out and work on meal planning and intentional eating  [name] has agreed to follow up with our clinic in 2 weeks. He was informed of the  importance of frequent follow up visits to maximize his success with intensive lifestyle modifications for his multiple health conditions.  ALLERGIES: No Known Allergies  MEDICATIONS: Current Outpatient Medications on File Prior to Visit  Medication Sig Dispense Refill  . Ascorbic Acid (VITAMIN C) 1000 MG tablet Take 1,000 mg by mouth daily.    [name] atorvastatin (LIPITOR) 80 MG tablet TAKE 1 TABLET (80 MG TOTAL) BY MOUTH DAILY AT 6 PM. 90 tablet 1  . clopidogrel (PLAVIX) 75 MG tablet Take 4 tablets (300 mg total) on Day 1 only, then take 1 tablet (75 mg total) once a day 90 tablet 3  . Cyanocobalamin (VITAMIN B 12 PO) Take 1 tablet by mouth daily.    [name] ezetimibe (ZETIA) 10 MG tablet Take 1 tablet (10 mg total) by mouth daily. 90 tablet 3  . metoprolol tartrate (LOPRESSOR) 25 MG tablet TAKE HALF A TABLET (12.5 MG TOTAL) BY MOUTH 2 (TWO) TIMES DAILY. 90 tablet 7  . Multiple Vitamin (MULTIVITAMIN) tablet Take 1 tablet by mouth daily.      . nitroGLYCERIN (NITROSTAT) 0.4 MG SL tablet Place 1 tablet (0.4 mg total) under the tongue every 5 (five) minutes x 3 doses as needed for chest pain. 25 tablet 11  . pantoprazole (PROTONIX) 40 MG tablet Take 1 tablet (40 mg total) by mouth daily. 90 tablet 3   No current facility-administered medications on file prior to visit.     PAST MEDICAL HISTORY: Past Medical History:  Diagnosis Date  . Allergy   . BACK PAIN   . ELEVATED BP READING WITHOUT DX HYPERTENSION 05/03/2008   Qualifier: Diagnosis of  By: Larose Kells MD, [place] GERD (gastroesophageal reflux disease)   . H/O cardiovascular stress test 2005    (-)  . Hyperlipidemia   . INSOMNIA-SLEEP DISORDER-UNSPEC   . PVC (premature ventricular contraction)     PAST SURGICAL HISTORY: Past Surgical History:  Procedure Laterality Date  . ARTERY REPAIR Right 08/23/2017   Procedure: EXPLORATION RIGHT RADIAL ARTERY,  RIGHT FOREARM FASCIOTOMY;  Surgeon: Elam Dutch, MD;  Location: [facility] OR;  Service:  Vascular;  Laterality: Right;  . CHOLECYSTECTOMY    . CORONARY STENT INTERVENTION N/A 08/23/2017   Procedure: CORONARY STENT INTERVENTION;  Surgeon: Jettie Booze, MD;  Location: [place] [facility];  Service: Cardiovascular;  Laterality: N/A;  . LEFT HEART CATH AND CORONARY ANGIOGRAPHY N/A 08/23/2017   Procedure: LEFT HEART CATH AND CORONARY ANGIOGRAPHY;  Surgeon: Jettie Booze, MD;  Location: [place] [facility];  Service: Cardiovascular;  Laterality: N/A;  . POLYPECTOMY    . TONSILLECTOMY     age 63 y/o    SOCIAL HISTORY: Social History   Tobacco Use  . Smoking status: Former Smoker    Quit date: 09/24/1988    Years since quitting: 30.1  . Smokeless tobacco: Former Systems developer    Types: Chew    Quit date: 1970  Substance Use Topics  . Alcohol use: Yes    Alcohol/week: 4.0 standard drinks    Types: 4 Glasses of wine per week    Comment:  occasionally on weekends  . Drug use: No    FAMILY HISTORY: Family History  Problem Relation Age of Onset  . Lung cancer Father        smoker  . Diabetes Maternal Grandmother   . Diabetes Maternal Grandfather   . Hypertension Mother   . Depression Mother   . Obesity Mother   . Heart attack Other        uncle MI at age 82s  . Stroke Other        GM, in her 74  . Colon cancer Neg Hx   . Prostate cancer Neg Hx   . Colon polyps Neg Hx   . Esophageal cancer Neg Hx   . Rectal cancer Neg Hx   . Stomach cancer Neg Hx   . Pancreatic cancer Neg Hx     ROS: Review of Systems  Constitutional: Negative for weight loss.  Gastrointestinal: Negative for nausea and vomiting.  Musculoskeletal:       Negative for muscle weakness    PHYSICAL EXAM: Blood pressure 125/78, pulse 60, temperature 98.7 F (37.1 C), temperature source Oral, height 5\' 11"  (1.803 m), weight 215 lb (97.5 kg), SpO2 98 %. Body mass index is 29.99 kg/m. Physical Exam Vitals signs reviewed.  Constitutional:      Appearance: Normal appearance. He is well-developed. He  is obese.  Cardiovascular:     Rate and Rhythm: Normal rate.  Pulmonary:     Effort: Pulmonary effort is normal.  Musculoskeletal: Normal range of motion.  Skin:    General: Skin is warm and dry.  Neurological:     Mental Status: He is alert and oriented to person, place, and time.  Psychiatric:        Mood and Affect: Mood normal.        Behavior: Behavior normal.     RECENT LABS AND TESTS: BMET    Component Value Date/Time   NA 139 09/18/2018 0801   K 4.6 09/18/2018 0801   CL 104 09/18/2018 0801   CO2 22 09/18/2018 0801   GLUCOSE 99 09/18/2018 0801   GLUCOSE 149 (H) 08/24/2017 0257   BUN 17 09/18/2018 0801   CREATININE 1.06 09/18/2018 0801   CALCIUM 9.2 09/18/2018 0801   GFRNONAA 74 09/18/2018 0801   GFRAA 86 09/18/2018 0801   Lab Results  Component Value Date   HGBA1C 5.6 09/18/2018   HGBA1C 5.7 (H) 03/04/2018   HGBA1C 5.5 11/26/2017   HGBA1C 5.7 (H) 08/08/2017   Lab Results  Component Value Date   INSULIN 15.3 09/18/2018   INSULIN 17.8 03/04/2018   INSULIN 8.7 11/26/2017   INSULIN 11.8 08/08/2017   CBC    Component Value Date/Time   WBC 12.1 (H) 08/24/2017 0257   RBC 3.93 (L) 08/24/2017 0257   HGB 11.0 (L) 08/24/2017 0257   HCT 33.6 (L) 08/24/2017 0257   PLT 204 08/24/2017 0257   MCV 85.5 08/24/2017 0257   MCH 28.0 08/24/2017 0257   MCHC 32.7 08/24/2017 0257   RDW 14.8 08/24/2017 0257   LYMPHSABS 1.7 07/15/2017 0828   MONOABS 0.4 07/15/2017 0828   EOSABS 0.2 07/15/2017 0828   BASOSABS 0.0 07/15/2017 0828   Iron/TIBC/Ferritin/ %Sat No results found for: IRON, TIBC, FERRITIN, IRONPCTSAT Lipid Panel     Component Value Date/Time   CHOL 174 09/18/2018 0801   TRIG 196 (H) 09/18/2018 0801   HDL 39 (L) 09/18/2018 0801   CHOLHDL 3.8 10/04/2017 0802   CHOLHDL 4 07/15/2017 1027  VLDL 41.0 (H) 07/15/2017 0828   LDLCALC 96 09/18/2018 0801   LDLDIRECT 105.0 07/15/2017 0828   Hepatic Function Panel     Component Value Date/Time   PROT 6.5  09/18/2018 0801   ALBUMIN 4.4 09/18/2018 0801   AST 22 09/18/2018 0801   ALT 34 09/18/2018 0801   ALKPHOS 51 09/18/2018 0801   BILITOT 0.4 09/18/2018 0801   BILIDIR 0.11 10/04/2017 0802      Component Value Date/Time   TSH 2.080 08/08/2017 1016   TSH 2.14 07/15/2017 0828   TSH 1.62 07/08/2015 1405     Ref. Range 09/18/2018 08:01  Vitamin D, 25-Hydroxy Latest Ref Range: 30.0 - 100.0 ng/mL 34.5    OBESITY BEHAVIORAL INTERVENTION VISIT  Today's visit was # 18   Starting weight: 216 lbs Starting date: 08/08/2017 Today's weight : 215 lbs Today's date: 11/05/2018 Total lbs lost to date: 1    11/05/2018  Height 5\' 11"  (1.803 m)  Weight 215 lb (97.5 kg)  BMI (Calculated) 30  BLOOD PRESSURE - SYSTOLIC 188  BLOOD PRESSURE - DIASTOLIC 78   Body Fat % 41.6 %  Total Body Water (lbs) 104 lbs    ASK: We discussed the diagnosis of obesity with [name] today and Maciej agreed to give [name] permission to discuss obesity behavioral modification therapy today.  ASSESS: Ty has the diagnosis of obesity and his BMI today is 80 Syris is in the action stage of change   ADVISE: Junius was educated on the multiple health risks of obesity as well as the benefit of weight loss to improve his health. He was advised of the need for long term treatment and the importance of lifestyle modifications to improve his current health and to decrease his risk of future health problems.  AGREE: Multiple dietary modification options and treatment options were discussed and  Marquan agreed to follow the recommendations documented in the above note.  ARRANGE: Kemar was educated on the importance of frequent visits to treat obesity as outlined per CMS and USPSTF guidelines and agreed to schedule his next follow up appointment today.  Corey Skains, am acting as Location manager for [employer]. Owens Shark, DO  I have reviewed the above documentation for accuracy and completeness, and I agree with  the above. -Jearld Lesch, DO

## 2018-11-07 ENCOUNTER — Other Ambulatory Visit (INDEPENDENT_AMBULATORY_CARE_PROVIDER_SITE_OTHER): Payer: Self-pay | Admitting: Bariatrics

## 2018-11-07 DIAGNOSIS — E559 Vitamin D deficiency, unspecified: Secondary | ICD-10-CM

## 2018-11-19 ENCOUNTER — Encounter (INDEPENDENT_AMBULATORY_CARE_PROVIDER_SITE_OTHER): Payer: Self-pay | Admitting: Bariatrics

## 2018-11-19 ENCOUNTER — Ambulatory Visit (INDEPENDENT_AMBULATORY_CARE_PROVIDER_SITE_OTHER): Payer: 59 | Admitting: Bariatrics

## 2018-11-19 ENCOUNTER — Other Ambulatory Visit: Payer: Self-pay

## 2018-11-19 VITALS — BP 121/79 | Temp 97.8°F | Ht 71.0 in | Wt 216.0 lb

## 2018-11-19 DIAGNOSIS — Z6830 Body mass index (BMI) 30.0-30.9, adult: Secondary | ICD-10-CM

## 2018-11-19 DIAGNOSIS — E669 Obesity, unspecified: Secondary | ICD-10-CM

## 2018-11-19 DIAGNOSIS — E8881 Metabolic syndrome: Secondary | ICD-10-CM

## 2018-11-19 DIAGNOSIS — E559 Vitamin D deficiency, unspecified: Secondary | ICD-10-CM | POA: Diagnosis not present

## 2018-11-19 DIAGNOSIS — F5089 Other specified eating disorder: Secondary | ICD-10-CM

## 2018-11-19 NOTE — Progress Notes (Signed)
Office: [phone]  /  Fax: [phone]   HPI:   Chief Complaint: OBESITY Shane Wood is here to discuss his progress with his obesity treatment plan. He is on the keep a food journal with 1400-1700 calories and 100 grams of protein daily and is following his eating plan approximately 25 % of the time. He states he is walking and bike riding for 30 minutes 4-5 times per week. [name] went up 1 lb, but he is only adhering to his plan 25 %. He weighed himself this morning at 214 lbs. He has had more cravings in the afternoon and at night (boredom eating). His weight is 216 lb (98 kg) today and has gained 1 lb since his last visit. He has lost 0 lbs since starting treatment with [name].  Vitamin D Deficiency [name] has a diagnosis of vitamin D deficiency. He is currently taking prescription Vit D. Last Vit D level was 34.5. He denies nausea, vomiting or muscle weakness.  Insulin Resistance [name] has a diagnosis of insulin resistance based on his elevated fasting insulin level >5. Last A1c was 5.6 and insulin of 15.3. Although [name]'s blood glucose readings are still under good control, insulin resistance puts him at greater risk of metabolic syndrome and diabetes. He is not taking metformin currently and continues to work on diet and exercise to decrease risk of diabetes. He denies polyphagia.  Eating Disorder [name] has increased boredom eating and emotional eating in the afternoon. We discussed Wellbutrin.  ASSESSMENT AND PLAN:  Vitamin D deficiency  Insulin resistance  Other disorder of eating  Class 1 obesity with serious comorbidity and body mass index (BMI) of 30.0 to 30.9 in adult, unspecified obesity type  PLAN:  Vitamin D Deficiency Pleas was informed that low vitamin D levels contributes to fatigue and are associated with obesity, breast, and colon cancer. [name] agrees to continue taking prescription Vit D 50,000 IU every week and will follow up for routine testing of  vitamin D, at least 2-3 times per year. He was informed of the risk of over-replacement of vitamin D and agrees to not increase his dose unless he discusses this with [name] first. [name] agrees to follow up with our clinic in 2 weeks.  Insulin Resistance [name] will continue to work on weight loss, exercise, increase protein, and decreasing simple carbohydrates in his diet to help decrease the risk of diabetes. We dicussed metformin including benefits and risks. He was informed that eating too many simple carbohydrates or too many calories at one sitting increases the likelihood of GI side effects. [name] declined metformin for now and prescription was not written today. [name] agrees to follow up with our clinic in 2 weeks as directed to monitor his progress.  Eating Disorder [name] will consider Wellbutrin, but no decision at this time. Dewane agrees to follow up with our clinic in 2 weeks.  I spent > than 50% of the 15 minute visit on counseling as documented in the note.  Obesity [name] is currently in the action stage of change. As such, his goal is to continue with weight loss efforts He has agreed to keep a food journal with 1400-1700 calories and 100 grams of protein daily [name] has been instructed to work up to a goal of 150 minutes of combined cardio and strengthening exercise per week or continue his pelaton bike 5-6 times per week. He will add in resistance for weight loss and overall health benefits. We discussed the following Behavioral Modification Strategies today: increasing  lean protein intake, decreasing simple carbohydrates, increasing vegetables, increase H20 intake, decrease eating out, no skipping meals, work on meal planning and easy cooking plans, keeping healthy foods in the home, and ways to avoid boredom eating   [name] has agreed to follow up with our clinic in 2 weeks. He was informed of the importance of frequent follow up visits to maximize his success with  intensive lifestyle modifications for his multiple health conditions.  ALLERGIES: No Known Allergies  MEDICATIONS: Current Outpatient Medications on File Prior to Visit  Medication Sig Dispense Refill   Ascorbic Acid (VITAMIN C) 1000 MG tablet Take 1,000 mg by mouth daily.     atorvastatin (LIPITOR) 80 MG tablet TAKE 1 TABLET (80 MG TOTAL) BY MOUTH DAILY AT 6 PM. 90 tablet 1   clopidogrel (PLAVIX) 75 MG tablet Take 4 tablets (300 mg total) on Day 1 only, then take 1 tablet (75 mg total) once a day 90 tablet 3   Cyanocobalamin (VITAMIN B 12 PO) Take 1 tablet by mouth daily.     ezetimibe (ZETIA) 10 MG tablet Take 1 tablet (10 mg total) by mouth daily. 90 tablet 3   metoprolol tartrate (LOPRESSOR) 25 MG tablet TAKE HALF A TABLET (12.5 MG TOTAL) BY MOUTH 2 (TWO) TIMES DAILY. 90 tablet 7   Multiple Vitamin (MULTIVITAMIN) tablet Take 1 tablet by mouth daily.       nitroGLYCERIN (NITROSTAT) 0.4 MG SL tablet Place 1 tablet (0.4 mg total) under the tongue every 5 (five) minutes x 3 doses as needed for chest pain. 25 tablet 11   pantoprazole (PROTONIX) 40 MG tablet Take 1 tablet (40 mg total) by mouth daily. 90 tablet 3   Vitamin D, Ergocalciferol, (DRISDOL) 1.25 MG (50000 UT) CAPS capsule Take 1 capsule (50,000 Units total) by mouth every 7 (seven) days. 4 capsule 0   No current facility-administered medications on file prior to visit.     PAST MEDICAL HISTORY: Past Medical History:  Diagnosis Date   Allergy    BACK PAIN    ELEVATED BP READING WITHOUT DX HYPERTENSION 05/03/2008   Qualifier: Diagnosis of  By: Larose Kells MD, Alda Berthold.    GERD (gastroesophageal reflux disease)    H/O cardiovascular stress test 2005    (-)   Hyperlipidemia    INSOMNIA-SLEEP DISORDER-UNSPEC    PVC (premature ventricular contraction)     PAST SURGICAL HISTORY: Past Surgical History:  Procedure Laterality Date   ARTERY REPAIR Right 08/23/2017   Procedure: EXPLORATION RIGHT RADIAL ARTERY,  RIGHT  FOREARM FASCIOTOMY;  Surgeon: Elam Dutch, MD;  Location: [place];  Service: Vascular;  Laterality: Right;   CHOLECYSTECTOMY     CORONARY STENT INTERVENTION N/A 08/23/2017   Procedure: CORONARY STENT INTERVENTION;  Surgeon: Jettie Booze, MD;  Location: [place] [facility];  Service: Cardiovascular;  Laterality: N/A;   LEFT HEART CATH AND CORONARY ANGIOGRAPHY N/A 08/23/2017   Procedure: LEFT HEART CATH AND CORONARY ANGIOGRAPHY;  Surgeon: Jettie Booze, MD;  Location: [place] [facility];  Service: Cardiovascular;  Laterality: N/A;   POLYPECTOMY     TONSILLECTOMY     age 63 y/o    SOCIAL HISTORY: Social History   Tobacco Use   Smoking status: Former Smoker    Quit date: 09/24/1988    Years since quitting: 30.1   Smokeless tobacco: Former Systems developer    Types: Lamar date: 1970  Substance Use Topics   Alcohol use: Yes    Alcohol/week:  4.0 standard drinks    Types: 4 Glasses of wine per week    Comment: occasionally on weekends   Drug use: No    FAMILY HISTORY: Family History  Problem Relation Age of Onset   Lung cancer Father        smoker   Diabetes Maternal Grandmother    Diabetes Maternal Grandfather    Hypertension Mother    Depression Mother    Obesity Mother    Heart attack Other        uncle MI at age 45s   Stroke Other        GM, in her 23   Colon cancer Neg Hx    Prostate cancer Neg Hx    Colon polyps Neg Hx    Esophageal cancer Neg Hx    Rectal cancer Neg Hx    Stomach cancer Neg Hx    Pancreatic cancer Neg Hx     ROS: Review of Systems  Constitutional: Negative for weight loss.  Gastrointestinal: Negative for nausea and vomiting.  Musculoskeletal:       Negative muscle weakness  Endo/Heme/Allergies:       Negative polyphagia    PHYSICAL EXAM: Blood pressure 121/79, temperature 97.8 F (36.6 C), temperature source Oral, height 5\' 11"  (1.803 m), weight 216 lb (98 kg). Body mass index is 30.13 kg/m. Physical  Exam Vitals signs reviewed.  Constitutional:      Appearance: Normal appearance. He is obese.  Cardiovascular:     Rate and Rhythm: Normal rate.     Pulses: Normal pulses.  Pulmonary:     Effort: Pulmonary effort is normal.     Breath sounds: Normal breath sounds.  Musculoskeletal: Normal range of motion.  Skin:    General: Skin is warm and dry.  Neurological:     Mental Status: He is alert and oriented to person, place, and time.  Psychiatric:        Mood and Affect: Mood normal.        Behavior: Behavior normal.     RECENT LABS AND TESTS: BMET    Component Value Date/Time   NA 139 09/18/2018 0801   K 4.6 09/18/2018 0801   CL 104 09/18/2018 0801   CO2 22 09/18/2018 0801   GLUCOSE 99 09/18/2018 0801   GLUCOSE 149 (H) 08/24/2017 0257   BUN 17 09/18/2018 0801   CREATININE 1.06 09/18/2018 0801   CALCIUM 9.2 09/18/2018 0801   GFRNONAA 74 09/18/2018 0801   GFRAA 86 09/18/2018 0801   Lab Results  Component Value Date   HGBA1C 5.6 09/18/2018   HGBA1C 5.7 (H) 03/04/2018   HGBA1C 5.5 11/26/2017   HGBA1C 5.7 (H) 08/08/2017   Lab Results  Component Value Date   INSULIN 15.3 09/18/2018   INSULIN 17.8 03/04/2018   INSULIN 8.7 11/26/2017   INSULIN 11.8 08/08/2017   CBC    Component Value Date/Time   WBC 12.1 (H) 08/24/2017 0257   RBC 3.93 (L) 08/24/2017 0257   HGB 11.0 (L) 08/24/2017 0257   HCT 33.6 (L) 08/24/2017 0257   PLT 204 08/24/2017 0257   MCV 85.5 08/24/2017 0257   MCH 28.0 08/24/2017 0257   MCHC 32.7 08/24/2017 0257   RDW 14.8 08/24/2017 0257   LYMPHSABS 1.7 07/15/2017 0828   MONOABS 0.4 07/15/2017 0828   EOSABS 0.2 07/15/2017 0828   BASOSABS 0.0 07/15/2017 0828   Iron/TIBC/Ferritin/ %Sat No results found for: IRON, TIBC, FERRITIN, IRONPCTSAT Lipid Panel     Component Value Date/Time  CHOL 174 09/18/2018 0801   TRIG 196 (H) 09/18/2018 0801   HDL 39 (L) 09/18/2018 0801   CHOLHDL 3.8 10/04/2017 0802   CHOLHDL 4 07/15/2017 0828   VLDL 41.0 (H)  07/15/2017 0828   LDLCALC 96 09/18/2018 0801   LDLDIRECT 105.0 07/15/2017 0828   Hepatic Function Panel     Component Value Date/Time   PROT 6.5 09/18/2018 0801   ALBUMIN 4.4 09/18/2018 0801   AST 22 09/18/2018 0801   ALT 34 09/18/2018 0801   ALKPHOS 51 09/18/2018 0801   BILITOT 0.4 09/18/2018 0801   BILIDIR 0.11 10/04/2017 0802      Component Value Date/Time   TSH 2.080 08/08/2017 1016   TSH 2.14 07/15/2017 0828   TSH 1.62 07/08/2015 1405      OBESITY BEHAVIORAL INTERVENTION VISIT  Today's visit was # 19   Starting weight: 216 lbs Starting date: 08/08/17 Today's weight : 216 lbs Today's date: 11/19/2018 Total lbs lost to date: 0    ASK: We discussed the diagnosis of obesity with [name] today and Travonte agreed to give [name] permission to discuss obesity behavioral modification therapy today.  ASSESS: Harl has the diagnosis of obesity and his BMI today is 30.14 Antavius is in the action stage of change   ADVISE: Wilkins was educated on the multiple health risks of obesity as well as the benefit of weight loss to improve his health. He was advised of the need for long term treatment and the importance of lifestyle modifications to improve his current health and to decrease his risk of future health problems.  AGREE: Multiple dietary modification options and treatment options were discussed and  Acelin agreed to follow the recommendations documented in the above note.  ARRANGE: Delmar was educated on the importance of frequent visits to treat obesity as outlined per CMS and USPSTF guidelines and agreed to schedule his next follow up appointment today.  Wilhemena Durie, am acting as transcriptionist for [employer], DO  I have reviewed the above documentation for accuracy and completeness, and I agree with the above. -Jearld Lesch, DO

## 2018-11-20 ENCOUNTER — Encounter (INDEPENDENT_AMBULATORY_CARE_PROVIDER_SITE_OTHER): Payer: Self-pay | Admitting: Bariatrics

## 2018-11-21 ENCOUNTER — Encounter: Payer: Self-pay | Admitting: Internal Medicine

## 2018-12-03 ENCOUNTER — Other Ambulatory Visit (INDEPENDENT_AMBULATORY_CARE_PROVIDER_SITE_OTHER): Payer: Self-pay | Admitting: Bariatrics

## 2018-12-03 DIAGNOSIS — E559 Vitamin D deficiency, unspecified: Secondary | ICD-10-CM

## 2018-12-04 ENCOUNTER — Ambulatory Visit (INDEPENDENT_AMBULATORY_CARE_PROVIDER_SITE_OTHER): Payer: 59 | Admitting: Bariatrics

## 2018-12-04 ENCOUNTER — Encounter (INDEPENDENT_AMBULATORY_CARE_PROVIDER_SITE_OTHER): Payer: Self-pay | Admitting: Bariatrics

## 2018-12-04 ENCOUNTER — Other Ambulatory Visit: Payer: Self-pay

## 2018-12-04 VITALS — BP 128/74 | HR 58 | Temp 98.3°F | Ht 71.0 in | Wt 214.0 lb

## 2018-12-04 DIAGNOSIS — Z6830 Body mass index (BMI) 30.0-30.9, adult: Secondary | ICD-10-CM

## 2018-12-04 DIAGNOSIS — E669 Obesity, unspecified: Secondary | ICD-10-CM

## 2018-12-04 DIAGNOSIS — E8881 Metabolic syndrome: Secondary | ICD-10-CM

## 2018-12-04 DIAGNOSIS — E559 Vitamin D deficiency, unspecified: Secondary | ICD-10-CM | POA: Diagnosis not present

## 2018-12-04 DIAGNOSIS — Z9189 Other specified personal risk factors, not elsewhere classified: Secondary | ICD-10-CM

## 2018-12-04 MED ORDER — VITAMIN D (ERGOCALCIFEROL) 1.25 MG (50000 UNIT) PO CAPS: 50000.0000 [IU] | ORAL_CAPSULE | ORAL | 0 refills | Status: DC

## 2018-12-08 NOTE — Progress Notes (Signed)
Office: [phone]  /  Fax: [phone]   HPI:   Chief Complaint: OBESITY Shane Wood is here to discuss his progress with his obesity treatment plan. He is on the keep a food journal with 1400 to 1700 calories and 100 grams of protein daily and is following his eating plan approximately 50 % of the time. He states he is walking 1 1/2 to 2 miles or riding the Peloton bike 20 minutes 5 times per week. Shane Wood is down two pounds. He is doing well with protein. His weight is 214 lb (97.1 kg) today and has had a weight loss of 2 pounds over a period of 2 weeks since his last visit. He has lost 2 lbs since starting treatment with [name].  Vitamin D deficiency Shane Wood has a diagnosis of vitamin D deficiency. He is currently taking vit D and denies nausea, vomiting or muscle weakness.  At risk for osteopenia and osteoporosis Shane Wood is at higher risk of osteopenia and osteoporosis due to vitamin D deficiency.   Insulin Resistance Shane Wood has a diagnosis of insulin resistance based on his elevated fasting insulin level >5. Although Shane Wood's blood glucose readings are still under good control, insulin resistance puts him at greater risk of metabolic syndrome and diabetes. His last A1c was at 5.6 and last insulin level was at 15.3 He is not taking metformin currently and continues to work on diet and exercise to decrease risk of diabetes.  ASSESSMENT AND PLAN:  Vitamin D deficiency - Plan: Vitamin D, Ergocalciferol, (DRISDOL) 1.25 MG (50000 UT) CAPS capsule  Insulin resistance  At risk for osteoporosis  Class 1 obesity with serious comorbidity and body mass index (BMI) of 30.0 to 30.9 in adult, unspecified obesity type  PLAN:  Vitamin D Deficiency Shane Wood was informed that low vitamin D levels contributes to fatigue and are associated with obesity, breast, and colon cancer. He agrees to continue to take prescription Vit D @50 ,000 IU every week #4 with no refills and will follow up for routine  testing of vitamin D, at least 2-3 times per year. He was informed of the risk of over-replacement of vitamin D and agrees to not increase his dose unless he discusses this with [name] first. Shane Wood agrees to follow up as directed.  At risk for osteopenia and osteoporosis Shane Wood was given extended  (15 minutes) osteoporosis prevention counseling today. Shane Wood is at risk for osteopenia and osteoporosis due to his vitamin D deficiency. He was encouraged to take his vitamin D and follow his higher calcium diet and increase strengthening exercise to help strengthen his bones and decrease his risk of osteopenia and osteoporosis.  Insulin Resistance Shane Wood will continue to work on weight loss, exercise, increasing lean protein increasing lean protein and decreasing simple carbohydrates in his diet to help decrease the risk of diabetes. He was informed that eating too many simple carbohydrates or too many calories at one sitting increases the likelihood of GI side effects. Rockey agreed to follow up with [name] as directed to monitor his progress.  Obesity Shane Wood is currently in the action stage of change. As such, his goal is to continue with weight loss efforts He has agreed to keep a food journal with 1400 to 1700 calories and 100 grams of protein daily Daymon will walk, ride the [employer] bike, do some weights and new apps for weight loss and overall health benefits. We discussed the following Behavioral Modification Strategies today: increase H2O intake (at least 50 cc), no skipping meals, keeping healthy  foods in the home, increasing lean protein intake, decreasing simple carbohydrates, increasing vegetables, decrease eating out and work on meal planning and intentional eating  Shane Wood has agreed to follow up with our clinic in 2 weeks. He was informed of the importance of frequent follow up visits to maximize his success with intensive lifestyle modifications for his multiple health  conditions.  ALLERGIES: No Known Allergies  MEDICATIONS: Current Outpatient Medications on File Prior to Visit  Medication Sig Dispense Refill   Ascorbic Acid (VITAMIN C) 1000 MG tablet Take 1,000 mg by mouth daily.     atorvastatin (LIPITOR) 80 MG tablet TAKE 1 TABLET (80 MG TOTAL) BY MOUTH DAILY AT 6 PM. 90 tablet 1   clopidogrel (PLAVIX) 75 MG tablet Take 4 tablets (300 mg total) on Day 1 only, then take 1 tablet (75 mg total) once a day 90 tablet 3   Cyanocobalamin (VITAMIN B 12 PO) Take 1 tablet by mouth daily.     ezetimibe (ZETIA) 10 MG tablet Take 1 tablet (10 mg total) by mouth daily. 90 tablet 3   metoprolol tartrate (LOPRESSOR) 25 MG tablet TAKE HALF A TABLET (12.5 MG TOTAL) BY MOUTH 2 (TWO) TIMES DAILY. 90 tablet 7   Multiple Vitamin (MULTIVITAMIN) tablet Take 1 tablet by mouth daily.       nitroGLYCERIN (NITROSTAT) 0.4 MG SL tablet Place 1 tablet (0.4 mg total) under the tongue every 5 (five) minutes x 3 doses as needed for chest pain. 25 tablet 11   pantoprazole (PROTONIX) 40 MG tablet Take 1 tablet (40 mg total) by mouth daily. 90 tablet 3   No current facility-administered medications on file prior to visit.     PAST MEDICAL HISTORY: Past Medical History:  Diagnosis Date   Allergy    BACK PAIN    ELEVATED BP READING WITHOUT DX HYPERTENSION 05/03/2008   Qualifier: Diagnosis of  By: Larose Kells MD, Alda Berthold.    GERD (gastroesophageal reflux disease)    H/O cardiovascular stress test 2005    (-)   Hyperlipidemia    INSOMNIA-SLEEP DISORDER-UNSPEC    PVC (premature ventricular contraction)     PAST SURGICAL HISTORY: Past Surgical History:  Procedure Laterality Date   ARTERY REPAIR Right 08/23/2017   Procedure: EXPLORATION RIGHT RADIAL ARTERY,  RIGHT FOREARM FASCIOTOMY;  Surgeon: Elam Dutch, MD;  Location: [facility];  Service: Vascular;  Laterality: Right;   CHOLECYSTECTOMY     CORONARY STENT INTERVENTION N/A 08/23/2017   Procedure: CORONARY STENT  INTERVENTION;  Surgeon: Jettie Booze, MD;  Location: [facility];  Service: Cardiovascular;  Laterality: N/A;   LEFT HEART CATH AND CORONARY ANGIOGRAPHY N/A 08/23/2017   Procedure: LEFT HEART CATH AND CORONARY ANGIOGRAPHY;  Surgeon: Jettie Booze, MD;  Location: [facility];  Service: Cardiovascular;  Laterality: N/A;   POLYPECTOMY     TONSILLECTOMY     age 63 y/o    SOCIAL HISTORY: Social History   Tobacco Use   Smoking status: Former Smoker    Quit date: 09/24/1988    Years since quitting: 30.2   Smokeless tobacco: Former Systems developer    Types: [place] date: 1970  Substance Use Topics   Alcohol use: Yes    Alcohol/week: 4.0 standard drinks    Types: 4 Glasses of wine per week    Comment: occasionally on weekends   Drug use: No    FAMILY HISTORY: Family History  Problem Relation Age of Onset   Lung  cancer Father        smoker   Diabetes Maternal Grandmother    Diabetes Maternal Grandfather    Hypertension Mother    Depression Mother    Obesity Mother    Heart attack Other        uncle MI at age 48s   Stroke Other        GM, in her 59   Colon cancer Neg Hx    Prostate cancer Neg Hx    Colon polyps Neg Hx    Esophageal cancer Neg Hx    Rectal cancer Neg Hx    Stomach cancer Neg Hx    Pancreatic cancer Neg Hx     ROS: Review of Systems  Constitutional: Positive for weight loss.  Gastrointestinal: Negative for nausea and vomiting.  Musculoskeletal:       Negative for muscle weakness    PHYSICAL EXAM: Blood pressure 128/74, pulse (!) 58, temperature 98.3 F (36.8 C), temperature source Oral, height 5\' 11"  (1.803 m), weight 214 lb (97.1 kg), SpO2 97 %. Body mass index is 29.85 kg/m. Physical Exam Vitals signs reviewed.  Constitutional:      Appearance: Normal appearance. He is well-developed. He is obese.  Cardiovascular:     Rate and Rhythm: Normal rate.  Pulmonary:     Effort: Pulmonary effort is normal.   Musculoskeletal: Normal range of motion.  Skin:    General: Skin is warm and dry.  Neurological:     Mental Status: He is alert and oriented to person, place, and time.  Psychiatric:        Mood and Affect: Mood normal.        Behavior: Behavior normal.     RECENT LABS AND TESTS: BMET    Component Value Date/Time   NA 139 09/18/2018 0801   K 4.6 09/18/2018 0801   CL 104 09/18/2018 0801   CO2 22 09/18/2018 0801   GLUCOSE 99 09/18/2018 0801   GLUCOSE 149 (H) 08/24/2017 0257   BUN 17 09/18/2018 0801   CREATININE 1.06 09/18/2018 0801   CALCIUM 9.2 09/18/2018 0801   GFRNONAA 74 09/18/2018 0801   GFRAA 86 09/18/2018 0801   Lab Results  Component Value Date   HGBA1C 5.6 09/18/2018   HGBA1C 5.7 (H) 03/04/2018   HGBA1C 5.5 11/26/2017   HGBA1C 5.7 (H) 08/08/2017   Lab Results  Component Value Date   INSULIN 15.3 09/18/2018   INSULIN 17.8 03/04/2018   INSULIN 8.7 11/26/2017   INSULIN 11.8 08/08/2017   CBC    Component Value Date/Time   WBC 12.1 (H) 08/24/2017 0257   RBC 3.93 (L) 08/24/2017 0257   HGB 11.0 (L) 08/24/2017 0257   HCT 33.6 (L) 08/24/2017 0257   PLT 204 08/24/2017 0257   MCV 85.5 08/24/2017 0257   MCH 28.0 08/24/2017 0257   MCHC 32.7 08/24/2017 0257   RDW 14.8 08/24/2017 0257   LYMPHSABS 1.7 07/15/2017 0828   MONOABS 0.4 07/15/2017 0828   EOSABS 0.2 07/15/2017 0828   BASOSABS 0.0 07/15/2017 0828   Iron/TIBC/Ferritin/ %Sat No results found for: IRON, TIBC, FERRITIN, IRONPCTSAT Lipid Panel     Component Value Date/Time   CHOL 174 09/18/2018 0801   TRIG 196 (H) 09/18/2018 0801   HDL 39 (L) 09/18/2018 0801   CHOLHDL 3.8 10/04/2017 0802   CHOLHDL 4 07/15/2017 0828   VLDL 41.0 (H) 07/15/2017 0828   LDLCALC 96 09/18/2018 0801   LDLDIRECT 105.0 07/15/2017 0828   Hepatic Function Panel  Component Value Date/Time   PROT 6.5 09/18/2018 0801   ALBUMIN 4.4 09/18/2018 0801   AST 22 09/18/2018 0801   ALT 34 09/18/2018 0801   ALKPHOS 51 09/18/2018  0801   BILITOT 0.4 09/18/2018 0801   BILIDIR 0.11 10/04/2017 0802      Component Value Date/Time   TSH 2.080 08/08/2017 1016   TSH 2.14 07/15/2017 0828   TSH 1.62 07/08/2015 1405     Ref. Range 09/18/2018 08:01  Vitamin D, 25-Hydroxy Latest Ref Range: 30.0 - 100.0 ng/mL 34.5    OBESITY BEHAVIORAL INTERVENTION VISIT  Today's visit was # 20   Starting weight: 216 lbs Starting date: 08/08/2017 Today's weight : 214 lbs  Today's date: 12/04/2018 Total lbs lost to date: 2    12/04/2018  Height 5\' 11"  (1.803 m)  Weight 214 lb (97.1 kg)  BMI (Calculated) 29.86  BLOOD PRESSURE - SYSTOLIC 308  BLOOD PRESSURE - DIASTOLIC 74   Body Fat % 65.7 %  Total Body Water (lbs) 102 lbs    ASK: We discussed the diagnosis of obesity with [name] today and Casmer agreed to give [name] permission to discuss obesity behavioral modification therapy today.  ASSESS: Ocie has the diagnosis of obesity and his BMI today is 29.86 Amanda is in the action stage of change   ADVISE: Antrell was educated on the multiple health risks of obesity as well as the benefit of weight loss to improve his health. He was advised of the need for long term treatment and the importance of lifestyle modifications to improve his current health and to decrease his risk of future health problems.  AGREE: Multiple dietary modification options and treatment options were discussed and  Christophor agreed to follow the recommendations documented in the above note.  ARRANGE: Ocie was educated on the importance of frequent visits to treat obesity as outlined per CMS and USPSTF guidelines and agreed to schedule his next follow up appointment today.  Corey Skains, am acting as Location manager for [employer]. Owens Shark, DO   I have reviewed the above documentation for accuracy and completeness, and I agree with the above. -Jearld Lesch, DO

## 2018-12-18 ENCOUNTER — Encounter (INDEPENDENT_AMBULATORY_CARE_PROVIDER_SITE_OTHER): Payer: Self-pay | Admitting: Bariatrics

## 2018-12-18 ENCOUNTER — Ambulatory Visit (INDEPENDENT_AMBULATORY_CARE_PROVIDER_SITE_OTHER): Payer: 59 | Admitting: Bariatrics

## 2018-12-18 ENCOUNTER — Other Ambulatory Visit: Payer: Self-pay

## 2018-12-18 VITALS — BP 115/67 | HR 65 | Temp 98.3°F | Ht 71.0 in | Wt 216.0 lb

## 2018-12-18 DIAGNOSIS — E669 Obesity, unspecified: Secondary | ICD-10-CM

## 2018-12-18 DIAGNOSIS — E8881 Metabolic syndrome: Secondary | ICD-10-CM | POA: Diagnosis not present

## 2018-12-18 DIAGNOSIS — E559 Vitamin D deficiency, unspecified: Secondary | ICD-10-CM

## 2018-12-18 DIAGNOSIS — Z6830 Body mass index (BMI) 30.0-30.9, adult: Secondary | ICD-10-CM | POA: Diagnosis not present

## 2018-12-22 ENCOUNTER — Ambulatory Visit (INDEPENDENT_AMBULATORY_CARE_PROVIDER_SITE_OTHER): Payer: 59 | Admitting: Internal Medicine

## 2018-12-22 ENCOUNTER — Encounter (INDEPENDENT_AMBULATORY_CARE_PROVIDER_SITE_OTHER): Payer: Self-pay | Admitting: Bariatrics

## 2018-12-22 ENCOUNTER — Encounter: Payer: Self-pay | Admitting: Internal Medicine

## 2018-12-22 ENCOUNTER — Other Ambulatory Visit: Payer: Self-pay

## 2018-12-22 VITALS — BP 138/73 | HR 56 | Temp 96.0°F | Resp 16 | Ht 71.0 in | Wt 222.5 lb

## 2018-12-22 DIAGNOSIS — Z0000 Encounter for general adult medical examination without abnormal findings: Secondary | ICD-10-CM | POA: Diagnosis not present

## 2018-12-22 DIAGNOSIS — F419 Anxiety disorder, unspecified: Secondary | ICD-10-CM

## 2018-12-22 DIAGNOSIS — Z23 Encounter for immunization: Secondary | ICD-10-CM | POA: Diagnosis not present

## 2018-12-22 DIAGNOSIS — D649 Anemia, unspecified: Secondary | ICD-10-CM

## 2018-12-22 NOTE — Progress Notes (Signed)
Pre visit review using our clinic review tool, if applicable. No additional management support is needed unless otherwise documented below in the visit note. 

## 2018-12-22 NOTE — Assessment & Plan Note (Signed)
--  Td 2017 - s/p zostavax  - shingrix: discuss next year - PNM 23 today - flu shot today --CCS:  Colonoscopy 09/2010, cscope again 11-2016 Dr. Ardis Hughs. Next per GI --Prostate ca screening: DRE normal today, check a PSA.  No symptoms --Exercise,diet: Working hard on eating healthy and he stays active --Labs: , Will check a PSA, CBC, iron and ferritin (last CBC showed low hemoglobin).

## 2018-12-22 NOTE — Patient Instructions (Addendum)
GO TO THE LAB : Get the blood work     GO TO THE FRONT DESK Schedule your next appointment   for another physical exam in 1 year    Check the  blood pressure 2 or 3 times a month   BP GOAL is between 110/65 and  135/85. If it is consistently higher or lower, let me know

## 2018-12-22 NOTE — Assessment & Plan Note (Signed)
Hyperlipidemia: On Lipitor and Zetia.  Managed by other provider.  Not yet at goal. CAD: Non-STEMI 08/2017, doing well, no chest pain. Anxiety: Since the non-STEMI, he is very concerned about any minor or transient symptoms, thinks "it could be my heart again".  I counseled him (is almost like a PTSD type of sx).  He does not desire any medication, asked him to consider a counselor. RTC 1 year

## 2018-12-22 NOTE — Progress Notes (Signed)
Office: [phone]  /  Fax: [phone]   HPI:   Chief Complaint: OBESITY Shane Wood is here to discuss his progress with his obesity treatment plan. He is on the keep a food journal with 1400 to 1700 calories and 100 grams of protein daily plan and is following his eating plan approximately 50 % of the time. He states he is walking 1 to 2 miles and riding the Pel[employer] bike 20 minutes 7 times per week. Shane Wood is up 2 pounds. He struggles slightly with routine. He has a tendency to "yoyo". His water weight is up 2.8 pounds. He has a physical exam on Monday. His weight is 216 lb (98 kg) today and has had a weight gain of 2 pounds over a period of 2 weeks since his last visit. He has lost 0 lbs since starting treatment with [name].  Vitamin D deficiency Shane Wood has a diagnosis of vitamin D deficiency. He is currently taking vit D and his last vitamin D level was at 34.5. Shane Wood denies nausea, vomiting or muscle weakness.  Insulin Resistance Shane Wood has a diagnosis of insulin resistance based on his elevated fasting insulin level >5. Although Shane Wood's blood glucose readings are still under good control, insulin resistance puts him at greater risk of metabolic syndrome and diabetes. His last A1c was at 5.6 and last insulin level was at 15.3 He is not taking metformin currently and he continues to work on diet and exercise to decrease risk of diabetes.  ASSESSMENT AND PLAN:  Vitamin D deficiency  Insulin resistance  Class 1 obesity with serious comorbidity and body mass index (BMI) of 30.0 to 30.9 in adult, unspecified obesity type  PLAN:  Vitamin D Deficiency [name] was informed that low vitamin D levels contributes to fatigue and are associated with obesity, breast, and colon cancer. He agrees to continue to take prescription Vit D @50 ,000 IU every week and will follow up for routine testing of vitamin D, at least 2-3 times per year. He was informed of the risk of over-replacement of  vitamin D and agrees to not increase his dose unless he discusses this with [name] first.  Insulin Resistance Shane Wood will continue to work on weight loss, exercise, increasing lean protein and decreasing simple carbohydrates in his diet to help decrease the risk of diabetes. He was informed that eating too many simple carbohydrates or too many calories at one sitting increases the likelihood of GI side effects. Shane Wood agreed to follow up with [name] as directed to monitor his progress.  I spent > than 50% of the 15 minute visit on counseling as documented in the note.  Obesity Shane Wood is currently in the action stage of change. As such, his goal is to continue with weight loss efforts He has agreed to keep a food journal with 1400 to 1700 calories and 100 grams of protein daily Shane Wood will continue walking and riding the [place] bike for weight loss and overall health benefits. We discussed the following Behavioral Modification Strategies today: increase H2O intake, increasing lean protein intake, decreasing simple carbohydrates, increasing vegetables, decreasing sodium intake, decrease eating out, meal planning and intentional eating and ways to avoid night time snacking Shane Wood will use "MyFitnessPal".  Shane Wood has agreed to follow up with our clinic in 3 weeks. He was informed of the importance of frequent follow up visits to maximize his success with intensive lifestyle modifications for his multiple health conditions.  ALLERGIES: No Known Allergies  MEDICATIONS: Current Outpatient Medications on File Prior to  Visit  Medication Sig Dispense Refill  . Ascorbic Acid (VITAMIN C) 1000 MG tablet Take 1,000 mg by mouth daily.    [name] atorvastatin (LIPITOR) 80 MG tablet TAKE 1 TABLET (80 MG TOTAL) BY MOUTH DAILY AT 6 PM. 90 tablet 1  . clopidogrel (PLAVIX) 75 MG tablet Take 4 tablets (300 mg total) on Day 1 only, then take 1 tablet (75 mg total) once a day 90 tablet 3  . Cyanocobalamin (VITAMIN B 12  PO) Take 1 tablet by mouth daily.    [name] ezetimibe (ZETIA) 10 MG tablet Take 1 tablet (10 mg total) by mouth daily. 90 tablet 3  . metoprolol tartrate (LOPRESSOR) 25 MG tablet TAKE HALF A TABLET (12.5 MG TOTAL) BY MOUTH 2 (TWO) TIMES DAILY. 90 tablet 7  . Multiple Vitamin (MULTIVITAMIN) tablet Take 1 tablet by mouth daily.      . nitroGLYCERIN (NITROSTAT) 0.4 MG SL tablet Place 1 tablet (0.4 mg total) under the tongue every 5 (five) minutes x 3 doses as needed for chest pain. 25 tablet 11  . pantoprazole (PROTONIX) 40 MG tablet Take 1 tablet (40 mg total) by mouth daily. 90 tablet 3  . Vitamin D, Ergocalciferol, (DRISDOL) 1.25 MG (50000 UT) CAPS capsule Take 1 capsule (50,000 Units total) by mouth every 7 (seven) days. 4 capsule 0   No current facility-administered medications on file prior to visit.     PAST MEDICAL HISTORY: Past Medical History:  Diagnosis Date  . Allergy   . BACK PAIN   . ELEVATED BP READING WITHOUT DX HYPERTENSION 05/03/2008   Qualifier: Diagnosis of  By: Larose Kells MD, [place] GERD (gastroesophageal reflux disease)   . H/O cardiovascular stress test 2005    (-)  . Hyperlipidemia   . INSOMNIA-SLEEP DISORDER-UNSPEC   . PVC (premature ventricular contraction)     PAST SURGICAL HISTORY: Past Surgical History:  Procedure Laterality Date  . ARTERY REPAIR Right 08/23/2017   Procedure: EXPLORATION RIGHT RADIAL ARTERY,  RIGHT FOREARM FASCIOTOMY;  Surgeon: Elam Dutch, MD;  Location: [facility];  Service: Vascular;  Laterality: Right;  . CHOLECYSTECTOMY    . CORONARY STENT INTERVENTION N/A 08/23/2017   Procedure: CORONARY STENT INTERVENTION;  Surgeon: Jettie Booze, MD;  Location: [facility];  Service: Cardiovascular;  Laterality: N/A;  . LEFT HEART CATH AND CORONARY ANGIOGRAPHY N/A 08/23/2017   Procedure: LEFT HEART CATH AND CORONARY ANGIOGRAPHY;  Surgeon: Jettie Booze, MD;  Location: [facility];  Service: Cardiovascular;  Laterality: N/A;  .  POLYPECTOMY    . TONSILLECTOMY     age 2 y/o    SOCIAL HISTORY: Social History   Tobacco Use  . Smoking status: Former Smoker    Quit date: 09/24/1988    Years since quitting: 30.2  . Smokeless tobacco: Former Systems developer    Types: Chew    Quit date: 1970  Substance Use Topics  . Alcohol use: Yes    Alcohol/week: 4.0 standard drinks    Types: 4 Glasses of wine per week    Comment: occasionally on weekends  . Drug use: No    FAMILY HISTORY: Family History  Problem Relation Age of Onset  . Lung cancer Father        smoker  . Diabetes Maternal Grandmother   . Diabetes Maternal Grandfather   . Hypertension Mother   . Depression Mother   . Obesity Mother   . Heart attack Other  uncle MI at age 14s  . Stroke Other        GM, in her 87  . Colon cancer Neg Hx   . Prostate cancer Neg Hx   . Colon polyps Neg Hx   . Esophageal cancer Neg Hx   . Rectal cancer Neg Hx   . Stomach cancer Neg Hx   . Pancreatic cancer Neg Hx     ROS: Review of Systems  Constitutional: Negative for weight loss.  Gastrointestinal: Negative for nausea and vomiting.  Musculoskeletal:       Negative for muscle weakness    PHYSICAL EXAM: Blood pressure 115/67, pulse 65, temperature 98.3 F (36.8 C), temperature source Oral, height 5\' 11"  (1.803 m), weight 216 lb (98 kg), SpO2 96 %. Body mass index is 30.13 kg/m. Physical Exam Vitals signs reviewed.  Constitutional:      Appearance: Normal appearance. He is well-developed. He is obese.  Cardiovascular:     Rate and Rhythm: Normal rate.  Pulmonary:     Effort: Pulmonary effort is normal.  Musculoskeletal: Normal range of motion.  Skin:    General: Skin is warm and dry.  Neurological:     Mental Status: He is alert and oriented to person, place, and time.  Psychiatric:        Mood and Affect: Mood normal.        Behavior: Behavior normal.     RECENT LABS AND TESTS: BMET    Component Value Date/Time   NA 139 09/18/2018 0801   K  4.6 09/18/2018 0801   CL 104 09/18/2018 0801   CO2 22 09/18/2018 0801   GLUCOSE 99 09/18/2018 0801   GLUCOSE 149 (H) 08/24/2017 0257   BUN 17 09/18/2018 0801   CREATININE 1.06 09/18/2018 0801   CALCIUM 9.2 09/18/2018 0801   GFRNONAA 74 09/18/2018 0801   GFRAA 86 09/18/2018 0801   Lab Results  Component Value Date   HGBA1C 5.6 09/18/2018   HGBA1C 5.7 (H) 03/04/2018   HGBA1C 5.5 11/26/2017   HGBA1C 5.7 (H) 08/08/2017   Lab Results  Component Value Date   INSULIN 15.3 09/18/2018   INSULIN 17.8 03/04/2018   INSULIN 8.7 11/26/2017   INSULIN 11.8 08/08/2017   CBC    Component Value Date/Time   WBC 12.1 (H) 08/24/2017 0257   RBC 3.93 (L) 08/24/2017 0257   HGB 11.0 (L) 08/24/2017 0257   HCT 33.6 (L) 08/24/2017 0257   PLT 204 08/24/2017 0257   MCV 85.5 08/24/2017 0257   MCH 28.0 08/24/2017 0257   MCHC 32.7 08/24/2017 0257   RDW 14.8 08/24/2017 0257   LYMPHSABS 1.7 07/15/2017 0828   MONOABS 0.4 07/15/2017 0828   EOSABS 0.2 07/15/2017 0828   BASOSABS 0.0 07/15/2017 0828   Iron/TIBC/Ferritin/ %Sat No results found for: IRON, TIBC, FERRITIN, IRONPCTSAT Lipid Panel     Component Value Date/Time   CHOL 174 09/18/2018 0801   TRIG 196 (H) 09/18/2018 0801   HDL 39 (L) 09/18/2018 0801   CHOLHDL 3.8 10/04/2017 0802   CHOLHDL 4 07/15/2017 0828   VLDL 41.0 (H) 07/15/2017 0828   LDLCALC 96 09/18/2018 0801   LDLDIRECT 105.0 07/15/2017 0828   Hepatic Function Panel     Component Value Date/Time   PROT 6.5 09/18/2018 0801   ALBUMIN 4.4 09/18/2018 0801   AST 22 09/18/2018 0801   ALT 34 09/18/2018 0801   ALKPHOS 51 09/18/2018 0801   BILITOT 0.4 09/18/2018 0801   BILIDIR 0.11 10/04/2017 0802  Component Value Date/Time   TSH 2.080 08/08/2017 1016   TSH 2.14 07/15/2017 0828   TSH 1.62 07/08/2015 1405     Ref. Range 09/18/2018 08:01  Vitamin D, 25-Hydroxy Latest Ref Range: 30.0 - 100.0 ng/mL 34.5    OBESITY BEHAVIORAL INTERVENTION VISIT  Today's visit was # 21    Starting weight: 216 lbs Starting date: 08/08/2017 Today's weight : 216 lbs Today's date: 12/18/2018 Total lbs lost to date: 0    12/18/2018  Height 5\' 11"  (1.803 m)  Weight 216 lb (98 kg)  BMI (Calculated) 30.14  BLOOD PRESSURE - SYSTOLIC [id number]  BLOOD PRESSURE - DIASTOLIC 67   Body Fat % [id number] %  Total Body Water (lbs) 104.8 lbs    ASK: We discussed the diagnosis of obesity with [name] today and Jarion agreed to give [name] permission to discuss obesity behavioral modification therapy today.  ASSESS: Cashmir has the diagnosis of obesity and his BMI today is 30.14 Squire is in the action stage of change   ADVISE: Aasim was educated on the multiple health risks of obesity as well as the benefit of weight loss to improve his health. He was advised of the need for long term treatment and the importance of lifestyle modifications to improve his current health and to decrease his risk of future health problems.  AGREE: Multiple dietary modification options and treatment options were discussed and  Xaine agreed to follow the recommendations documented in the above note.  ARRANGE: Dayshon was educated on the importance of frequent visits to treat obesity as outlined per CMS and USPSTF guidelines and agreed to schedule his next follow up appointment today.  Corey Skains, am acting as Location manager for [employer]. Owens Shark, DO  I have reviewed the above documentation for accuracy and completeness, and I agree with the above. -Jearld Lesch, DO

## 2018-12-22 NOTE — Progress Notes (Signed)
Subjective:    Patient ID: Shane Wood, male    DOB: 24-Sep-1955, 63 y.o.   MRN: [id number]  DOS:  12/22/2018 Type of visit - description: CPX Since the last office visit more than a year ago he had non-STEMI. He is doing well, working on his lifestyle. He admits that because the non-STEMI was associated with very mild symptoms now he gets very anxious about any minor discomfort from arm pain to mild dyspepsia after eating. When he exercises he feels well.  Review of Systems  Other than above, a 14 point review of systems is negative    Past Medical History:  Diagnosis Date  . Allergy   . BACK PAIN   . ELEVATED BP READING WITHOUT DX HYPERTENSION 05/03/2008   Qualifier: Diagnosis of  By: Larose Kells MD, [place] GERD (gastroesophageal reflux disease)   . H/O cardiovascular stress test 2005    (-)  . Hyperlipidemia   . INSOMNIA-SLEEP DISORDER-UNSPEC   . PVC (premature ventricular contraction)     Past Surgical History:  Procedure Laterality Date  . ARTERY REPAIR Right 08/23/2017   Procedure: EXPLORATION RIGHT RADIAL ARTERY,  RIGHT FOREARM FASCIOTOMY;  Surgeon: Elam Dutch, MD;  Location: [facility] OR;  Service: Vascular;  Laterality: Right;  . CHOLECYSTECTOMY    . CORONARY STENT INTERVENTION N/A 08/23/2017   Procedure: CORONARY STENT INTERVENTION;  Surgeon: Jettie Booze, MD;  Location: [place] [facility];  Service: Cardiovascular;  Laterality: N/A;  . LEFT HEART CATH AND CORONARY ANGIOGRAPHY N/A 08/23/2017   Procedure: LEFT HEART CATH AND CORONARY ANGIOGRAPHY;  Surgeon: Jettie Booze, MD;  Location: [place] [facility];  Service: Cardiovascular;  Laterality: N/A;  . POLYPECTOMY    . TONSILLECTOMY     age 35 y/o    Social History   Socioeconomic History  . Marital status: Married    Spouse name: [name]  . Number of children: 2  . Years of education: Not on file  . Highest education level: Not on file  Occupational History  . Occupation: finances , used to work for  [employer]  . Financial resource strain: Not on file  . Food insecurity    Worry: Not on file    Inability: Not on file  . Transportation needs    Medical: Not on file    Non-medical: Not on file  Tobacco Use  . Smoking status: Former Smoker    Quit date: 09/24/1988    Years since quitting: 30.2  . Smokeless tobacco: Former Systems developer    Types: Chew    Quit date: 1970  Substance and Sexual Activity  . Alcohol use: Yes    Alcohol/week: 4.0 standard drinks    Types: 4 Glasses of wine per week    Comment: occasionally on weekends  . Drug use: No  . Sexual activity: Not on file  Lifestyle  . Physical activity    Days per week: Not on file    Minutes per session: Not on file  . Stress: Not on file  Relationships  . Social Herbalist on phone: Not on file    Gets together: Not on file    Attends religious service: Not on file    Active member of club or organization: Not on file    Attends meetings of clubs or organizations: Not on file    Relationship status: Not on file  . Intimate partner violence    Fear of  current or ex partner: Not on file    Emotionally abused: Not on file    Physically abused: Not on file    Forced sexual activity: Not on file  Other Topics Concern  . Not on file  Social History Narrative   Household pt and wife    2 adult married children     Family History  Problem Relation Age of Onset  . Lung cancer Father        smoker  . Diabetes Maternal Grandmother   . Diabetes Maternal Grandfather   . Hypertension Mother   . Depression Mother   . Obesity Mother   . Heart attack Other        uncle MI at age 21s  . Stroke Other        GM, in her 71  . Colon cancer Neg Hx   . Prostate cancer Neg Hx   . Colon polyps Neg Hx   . Esophageal cancer Neg Hx   . Rectal cancer Neg Hx   . Stomach cancer Neg Hx   . Pancreatic cancer Neg Hx      Allergies as of 12/22/2018   No Known Allergies     Medication List       Accurate as of  December 22, 2018  5:06 PM. If you have any questions, ask your nurse or doctor.        atorvastatin 80 MG tablet Commonly known as: LIPITOR TAKE 1 TABLET (80 MG TOTAL) BY MOUTH DAILY AT 6 PM.   clopidogrel 75 MG tablet Commonly known as: PLAVIX Take 4 tablets (300 mg total) on Day 1 only, then take 1 tablet (75 mg total) once a day   ezetimibe 10 MG tablet Commonly known as: ZETIA Take 1 tablet (10 mg total) by mouth daily.   metoprolol tartrate 25 MG tablet Commonly known as: LOPRESSOR TAKE HALF A TABLET (12.5 MG TOTAL) BY MOUTH 2 (TWO) TIMES DAILY.   multivitamin tablet Take 1 tablet by mouth daily.   nitroGLYCERIN 0.4 MG SL tablet Commonly known as: NITROSTAT Place 1 tablet (0.4 mg total) under the tongue every 5 (five) minutes x 3 doses as needed for chest pain.   pantoprazole 40 MG tablet Commonly known as: PROTONIX Take 1 tablet (40 mg total) by mouth daily.   VITAMIN B 12 PO Take 1 tablet by mouth daily.   vitamin C 1000 MG tablet Take 1,000 mg by mouth daily.   Vitamin D (Ergocalciferol) 1.25 MG (50000 UT) Caps capsule Commonly known as: DRISDOL Take 1 capsule (50,000 Units total) by mouth every 7 (seven) days.           Objective:   Physical Exam BP 138/73 (BP Location: Left Arm, Patient Position: Sitting, Cuff Size: Normal)   Pulse (!) 56   Temp (!) 96 F (35.6 C) (Temporal)   Resp 16   Ht 5\' 11"  (1.803 m)   Wt 222 lb 8 oz (100.9 kg)   SpO2 100%   BMI 31.03 kg/m  General: Well developed, NAD, BMI noted Neck: No  thyromegaly  HEENT:  Normocephalic . Face symmetric, atraumatic Lungs:  CTA B Normal respiratory effort, no intercostal retractions, no accessory muscle use. Heart: RRR,  no murmur.  No pretibial edema bilaterally  Abdomen:  Not distended, soft, non-tender. No rebound or rigidity. DRE: Normal sphincter tone, brown stools, prostate normal. Skin: Exposed areas without rash. Not pale. Not jaundice Neurologic:  alert & oriented X3.   Speech normal,  gait appropriate for age and unassisted Strength symmetric and appropriate for age.  Psych: Cognition and judgment appear intact.  Cooperative with normal attention span and concentration.  Behavior appropriate. No anxious or depressed appearing.     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  Managed by other provider.  Not yet at goal. CAD: Non-STEMI 08/2017, doing well, no chest pain. Anxiety: Since the non-STEMI, he is very concerned about any minor or transient symptoms, thinks "it could be my heart again".  I counseled him (is almost like a PTSD type of sx).  He does not desire any medication, asked him to consider a counselor. RTC 1 year

## 2018-12-23 LAB — PSA: PSA: 0.96 ng/mL (ref 0.10–4.00)

## 2018-12-23 LAB — CBC WITH DIFFERENTIAL/PLATELET
Basophils Absolute: 0 10*3/uL (ref 0.0–0.1)
Basophils Relative: 0.5 % (ref 0.0–3.0)
Eosinophils Absolute: 0.2 10*3/uL (ref 0.0–0.7)
Eosinophils Relative: 2.9 % (ref 0.0–5.0)
HCT: 40.9 % (ref 39.0–52.0)
Hemoglobin: 13.8 g/dL (ref 13.0–17.0)
Lymphocytes Relative: 29.8 % (ref 12.0–46.0)
Lymphs Abs: 2.1 10*3/uL (ref 0.7–4.0)
MCHC: 33.8 g/dL (ref 30.0–36.0)
MCV: 87.4 fl (ref 78.0–100.0)
Monocytes Absolute: 0.5 10*3/uL (ref 0.1–1.0)
Monocytes Relative: 6.9 % (ref 3.0–12.0)
Neutro Abs: 4.3 10*3/uL (ref 1.4–7.7)
Neutrophils Relative %: 59.9 % (ref 43.0–77.0)
Platelets: 213 10*3/uL (ref 150.0–400.0)
RBC: 4.68 Mil/uL (ref 4.22–5.81)
RDW: 14.6 % (ref 11.5–15.5)
WBC: 7.1 10*3/uL (ref 4.0–10.5)

## 2018-12-23 LAB — FERRITIN: Ferritin: 96.8 ng/mL (ref 22.0–322.0)

## 2018-12-23 LAB — IRON: Iron: 103 ug/dL (ref 42–165)

## 2018-12-23 LAB — HEMOGLOBIN A1C: Hgb A1c MFr Bld: 5.8 % (ref 4.6–6.5)

## 2019-01-02 ENCOUNTER — Other Ambulatory Visit (INDEPENDENT_AMBULATORY_CARE_PROVIDER_SITE_OTHER): Payer: Self-pay | Admitting: Bariatrics

## 2019-01-02 DIAGNOSIS — E559 Vitamin D deficiency, unspecified: Secondary | ICD-10-CM

## 2019-01-08 ENCOUNTER — Other Ambulatory Visit: Payer: Self-pay

## 2019-01-08 ENCOUNTER — Ambulatory Visit (INDEPENDENT_AMBULATORY_CARE_PROVIDER_SITE_OTHER): Payer: 59 | Admitting: Bariatrics

## 2019-01-08 ENCOUNTER — Encounter (INDEPENDENT_AMBULATORY_CARE_PROVIDER_SITE_OTHER): Payer: Self-pay | Admitting: Bariatrics

## 2019-01-08 ENCOUNTER — Encounter: Payer: Self-pay | Admitting: Bariatrics

## 2019-01-08 VITALS — BP 132/78 | HR 60 | Temp 98.5°F | Ht 71.0 in | Wt 215.0 lb

## 2019-01-08 DIAGNOSIS — E669 Obesity, unspecified: Secondary | ICD-10-CM

## 2019-01-08 DIAGNOSIS — R7303 Prediabetes: Secondary | ICD-10-CM | POA: Diagnosis not present

## 2019-01-08 DIAGNOSIS — Z9189 Other specified personal risk factors, not elsewhere classified: Secondary | ICD-10-CM

## 2019-01-08 DIAGNOSIS — R0602 Shortness of breath: Secondary | ICD-10-CM

## 2019-01-08 DIAGNOSIS — E559 Vitamin D deficiency, unspecified: Secondary | ICD-10-CM

## 2019-01-08 DIAGNOSIS — Z6830 Body mass index (BMI) 30.0-30.9, adult: Secondary | ICD-10-CM

## 2019-01-08 DIAGNOSIS — R5383 Other fatigue: Secondary | ICD-10-CM | POA: Diagnosis not present

## 2019-01-08 MED ORDER — VITAMIN D (ERGOCALCIFEROL) 1.25 MG (50000 UNIT) PO CAPS: 50000.0000 [IU] | ORAL_CAPSULE | ORAL | 0 refills | Status: DC

## 2019-01-12 NOTE — Progress Notes (Signed)
Office: [phone]  /  Fax: [phone]   HPI:   Chief Complaint: OBESITY Shane Wood is here to discuss his progress with his obesity treatment plan. He is keeping a food journal with 1400-1700 calories and 100 grams of protein and is following his eating plan approximately 50% of the time. He states he is exercising on Peloton bike or walking 30-45 minutes 5-6 times per week. Shane Wood is down 1 lb but has struggled generally. He is getting adequate water and protein. His weight is 215 lb (97.5 kg) today and has had a weight loss of 1 pound over a period of 3 weeks since his last visit. He has lost 1 lb since starting treatment with [name].  Vitamin D deficiency Shane Wood has a diagnosis of Vitamin D deficiency. He is currently taking prescription Vit D and denies nausea, vomiting or muscle weakness.  At risk for osteopenia and osteoporosis Shane Wood is at higher risk of osteopenia and osteoporosis due to Vitamin D deficiency.   Pre-Diabetes Shane Wood has a diagnosis of prediabetes based on his elevated Hgb A1c and was informed this puts him at greater risk of developing diabetes. Last A1c 5.8 on 12/22/2018 with an insulin of 15.3 on 09/18/2018. He is not taking metformin currently and continues to work on diet and exercise to decrease risk of diabetes. He denies nausea or hypoglycemia.  Fatigue and Shortness of Breath Shane Wood feels his energy is lower than it should be. He reports fatigue and shortness of breath with certain activities.  ASSESSMENT AND PLAN:  Vitamin D deficiency - Plan: Vitamin D, Ergocalciferol, (DRISDOL) 1.25 MG (50000 UT) CAPS capsule  Prediabetes  Other fatigue  Shortness of breath on exertion  At risk for osteoporosis  Class 1 obesity with serious comorbidity and body mass index (BMI) of 30.0 to 30.9 in adult, unspecified obesity type  PLAN:  Vitamin D Deficiency Shane Wood was informed that low Vitamin D levels contributes to fatigue and are associated with  obesity, breast, and colon cancer. He agrees to continue to take prescription Vit D @ 50,000 IU every week #4 with 0 refills and will follow-up for routine testing of Vitamin D, at least 2-3 times per year. He was informed of the risk of over-replacement of Vitamin D and agrees to not increase his dose unless he discusses this with [name] first. Shane Wood agrees to follow-up with our clinic in 2 weeks.  At risk for osteopenia and osteoporosis General was given extended  (15 minutes) osteoporosis prevention counseling today. Shane Wood is at risk for osteopenia and osteoporosis due to his Vitamin D deficiency. He was encouraged to take his Vitamin D and follow his higher calcium diet and increase strengthening exercise to help strengthen his bones and decrease his risk of osteopenia and osteoporosis.  Pre-Diabetes Shane Wood will continue to work on weight loss, exercise, and decreasing simple carbohydrates in his diet to help decrease the risk of diabetes. We dicussed metformin including benefits and risks. He was informed that eating too many simple carbohydrates or too many calories at one sitting increases the likelihood of GI side effects. Shane Wood was instructed to decrease carbohydrates and will begin a low carbohydrate diet. He will follow-up with [name] as directed to monitor his progress.  Fatigue and Shortness of Breath Shane Wood was informed that his fatigue may be related to obesity, depression or many other causes. Shane Wood will gradually increase exercise and will begin resistance training.  Obesity Shane Wood is currently in the action stage of change. As such, his goal is  to continue with weight loss efforts. He has agreed to follow a lower carbohydrate, vegetable and lean protein rich diet plan. Shane Wood will have IC checked in 1 month and will begin a low carb diet for 1 month. Shane Wood has been instructed to do resistance with Peloton bike for weight loss and overall health benefits. We discussed the  following Behavioral Modification Strategies today: increasing lean protein intake, decreasing simple carbohydrates, increasing vegetables, increase H20 intake, decrease eating out, no skipping meals, work on meal planning and easy cooking plans, keeping healthy foods in the home, and planning for success.  Shane Wood has agreed to follow-up with our clinic in 2 weeks. He was informed of the importance of frequent follow-up visits to maximize his success with intensive lifestyle modifications for his multiple health conditions.  ALLERGIES: No Known Allergies  MEDICATIONS: Current Outpatient Medications on File Prior to Visit  Medication Sig Dispense Refill  . Ascorbic Acid (VITAMIN C) 1000 MG tablet Take 1,000 mg by mouth daily.    Shane Wood atorvastatin (LIPITOR) 80 MG tablet TAKE 1 TABLET (80 MG TOTAL) BY MOUTH DAILY AT 6 PM. 90 tablet 1  . clopidogrel (PLAVIX) 75 MG tablet Take 4 tablets (300 mg total) on Day 1 only, then take 1 tablet (75 mg total) once a day 90 tablet 3  . Cyanocobalamin (VITAMIN B 12 PO) Take 1 tablet by mouth daily.    Shane Wood ezetimibe (ZETIA) 10 MG tablet Take 1 tablet (10 mg total) by mouth daily. 90 tablet 3  . metoprolol tartrate (LOPRESSOR) 25 MG tablet TAKE HALF A TABLET (12.5 MG TOTAL) BY MOUTH 2 (TWO) TIMES DAILY. 90 tablet 7  . Multiple Vitamin (MULTIVITAMIN) tablet Take 1 tablet by mouth daily.      . nitroGLYCERIN (NITROSTAT) 0.4 MG SL tablet Place 1 tablet (0.4 mg total) under the tongue every 5 (five) minutes x 3 doses as needed for chest pain. (Patient not taking: Reported on 12/22/2018) 25 tablet 11  . pantoprazole (PROTONIX) 40 MG tablet Take 1 tablet (40 mg total) by mouth daily. 90 tablet 3   No current facility-administered medications on file prior to visit.     PAST MEDICAL HISTORY: Past Medical History:  Diagnosis Date  . Allergy   . BACK PAIN   . ELEVATED BP READING WITHOUT DX HYPERTENSION 05/03/2008   Qualifier: Diagnosis of  By: Larose Kells MD, [place] GERD  (gastroesophageal reflux disease)   . H/O cardiovascular stress test 2005    (-)  . Hyperlipidemia   . INSOMNIA-SLEEP DISORDER-UNSPEC   . PVC (premature ventricular contraction)     PAST SURGICAL HISTORY: Past Surgical History:  Procedure Laterality Date  . ARTERY REPAIR Right 08/23/2017   Procedure: EXPLORATION RIGHT RADIAL ARTERY,  RIGHT FOREARM FASCIOTOMY;  Surgeon: Elam Dutch, MD;  Location: [facility] OR;  Service: Vascular;  Laterality: Right;  . CHOLECYSTECTOMY    . CORONARY STENT INTERVENTION N/A 08/23/2017   Procedure: CORONARY STENT INTERVENTION;  Surgeon: Jettie Booze, MD;  Location: [place] [facility];  Service: Cardiovascular;  Laterality: N/A;  . LEFT HEART CATH AND CORONARY ANGIOGRAPHY N/A 08/23/2017   Procedure: LEFT HEART CATH AND CORONARY ANGIOGRAPHY;  Surgeon: Jettie Booze, MD;  Location: [place] [facility];  Service: Cardiovascular;  Laterality: N/A;  . POLYPECTOMY    . TONSILLECTOMY     age 63 y/o    SOCIAL HISTORY: Social History   Tobacco Use  . Smoking status: Former Audiological scientist  date: 09/24/1988    Years since quitting: 30.3  . Smokeless tobacco: Former Systems developer    Types: Chew    Quit date: 1970  Substance Use Topics  . Alcohol use: Yes    Alcohol/week: 4.0 standard drinks    Types: 4 Glasses of wine per week    Comment: occasionally on weekends  . Drug use: No    FAMILY HISTORY: Family History  Problem Relation Age of Onset  . Lung cancer Father        smoker  . Diabetes Maternal Grandmother   . Diabetes Maternal Grandfather   . Hypertension Mother   . Depression Mother   . Obesity Mother   . Heart attack Other        uncle MI at age 51s  . Stroke Other        GM, in her 42  . Colon cancer Neg Hx   . Prostate cancer Neg Hx   . Colon polyps Neg Hx   . Esophageal cancer Neg Hx   . Rectal cancer Neg Hx   . Stomach cancer Neg Hx   . Pancreatic cancer Neg Hx    ROS: Review of Systems  Constitutional: Positive for  malaise/fatigue.  Respiratory: Positive for shortness of breath.   Gastrointestinal: Negative for nausea and vomiting.  Musculoskeletal:       Negative for muscle weakness.  Endo/Heme/Allergies:       Negative for hypoglycemia.   PHYSICAL EXAM: Blood pressure 132/78, pulse 60, temperature 98.5 F (36.9 C), temperature source Oral, height 5\' 11"  (1.803 m), weight 215 lb (97.5 kg), SpO2 98 %. Body mass index is 29.99 kg/m. Physical Exam Vitals signs reviewed.  Constitutional:      Appearance: Normal appearance. He is obese.  Cardiovascular:     Rate and Rhythm: Normal rate.     Pulses: Normal pulses.  Pulmonary:     Effort: Pulmonary effort is normal.     Breath sounds: Normal breath sounds.  Musculoskeletal: Normal range of motion.  Skin:    General: Skin is warm and dry.  Neurological:     Mental Status: He is alert and oriented to person, place, and time.  Psychiatric:        Behavior: Behavior normal.   RECENT LABS AND TESTS: BMET    Component Value Date/Time   NA 139 09/18/2018 0801   K 4.6 09/18/2018 0801   CL 104 09/18/2018 0801   CO2 22 09/18/2018 0801   GLUCOSE 99 09/18/2018 0801   GLUCOSE 149 (H) 08/24/2017 0257   BUN 17 09/18/2018 0801   CREATININE 1.06 09/18/2018 0801   CALCIUM 9.2 09/18/2018 0801   GFRNONAA 74 09/18/2018 0801   GFRAA 86 09/18/2018 0801   Lab Results  Component Value Date   HGBA1C 5.8 12/22/2018   HGBA1C 5.6 09/18/2018   HGBA1C 5.7 (H) 03/04/2018   HGBA1C 5.5 11/26/2017   HGBA1C 5.7 (H) 08/08/2017   Lab Results  Component Value Date   INSULIN 15.3 09/18/2018   INSULIN 17.8 03/04/2018   INSULIN 8.7 11/26/2017   INSULIN 11.8 08/08/2017   CBC    Component Value Date/Time   WBC 7.1 12/22/2018 1422   RBC 4.68 12/22/2018 1422   HGB 13.8 12/22/2018 1422   HCT 40.9 12/22/2018 1422   PLT 213.0 12/22/2018 1422   MCV 87.4 12/22/2018 1422   MCH 28.0 08/24/2017 0257   MCHC 33.8 12/22/2018 1422   RDW 14.6 12/22/2018 1422    LYMPHSABS 2.1 12/22/2018 1422  MONOABS 0.5 12/22/2018 1422   EOSABS 0.2 12/22/2018 1422   BASOSABS 0.0 12/22/2018 1422   Iron/TIBC/Ferritin/ %Sat    Component Value Date/Time   IRON 103 12/22/2018 1422   FERRITIN 96.8 12/22/2018 1422   Lipid Panel     Component Value Date/Time   CHOL 174 09/18/2018 0801   TRIG 196 (H) 09/18/2018 0801   HDL 39 (L) 09/18/2018 0801   CHOLHDL 3.8 10/04/2017 0802   CHOLHDL 4 07/15/2017 0828   VLDL 41.0 (H) 07/15/2017 0828   LDLCALC 96 09/18/2018 0801   LDLDIRECT 105.0 07/15/2017 0828   Hepatic Function Panel     Component Value Date/Time   PROT 6.5 09/18/2018 0801   ALBUMIN 4.4 09/18/2018 0801   AST 22 09/18/2018 0801   ALT 34 09/18/2018 0801   ALKPHOS 51 09/18/2018 0801   BILITOT 0.4 09/18/2018 0801   BILIDIR 0.11 10/04/2017 0802      Component Value Date/Time   TSH 2.080 08/08/2017 1016   TSH 2.14 07/15/2017 0828   TSH 1.62 07/08/2015 1405   Results for Shane Wood, Artur A "PAT" (MRN [id number]) as of 01/12/2019 15:38  Ref. Range 09/18/2018 08:01  Vitamin D, 25-Hydroxy Latest Ref Range: 30.0 - 100.0 ng/mL 34.5   OBESITY BEHAVIORAL INTERVENTION VISIT  Today's visit was #22   Starting weight: 216 lbs Starting date: 08/08/2017 Today's weight: 215 lbs  Today's date: 01/08/2019 Total lbs lost to date: 1   01/08/2019  Height 5\' 11"  (1.803 m)  Weight 215 lb (97.5 kg)  BMI (Calculated) 30  BLOOD PRESSURE - SYSTOLIC [id number]  BLOOD PRESSURE - DIASTOLIC 78   Body Fat % 31 %  Total Body Water (lbs) 103 lbs  RMR 1397   ASK: We discussed the diagnosis of obesity with [name] today and Tyrece agreed to give [name] permission to discuss obesity behavioral modification therapy today.  ASSESS: Tiger has the diagnosis of obesity and his BMI today is 30.1. Amonte is in the action stage of change.  ADVISE: Eray was educated on the multiple health risks of obesity as well as the benefit of weight loss to improve his health. He was  advised of the need for long term treatment and the importance of lifestyle modifications to improve his current health and to decrease his risk of future health problems.  AGREE: Multiple dietary modification options and treatment options were discussed and  Thaxton agreed to follow the recommendations documented in the above note.  ARRANGE: Raahim was educated on the importance of frequent visits to treat obesity as outlined per CMS and USPSTF guidelines and agreed to schedule his next follow up appointment today.  Migdalia Dk, am acting as Location manager for [employer], DO  I have reviewed the above documentation for accuracy and completeness, and I agree with the above. -Jearld Lesch, DO

## 2019-01-13 ENCOUNTER — Encounter (INDEPENDENT_AMBULATORY_CARE_PROVIDER_SITE_OTHER): Payer: Self-pay | Admitting: Bariatrics

## 2019-01-17 ENCOUNTER — Other Ambulatory Visit: Payer: Self-pay | Admitting: Physician Assistant

## 2019-01-19 ENCOUNTER — Other Ambulatory Visit: Payer: Self-pay

## 2019-01-19 MED ORDER — NITROGLYCERIN 0.4 MG SL SUBL: 0.4000 mg | SUBLINGUAL_TABLET | SUBLINGUAL | 3 refills | Status: DC | PRN

## 2019-01-19 NOTE — Telephone Encounter (Signed)
Refill Request.  

## 2019-01-22 ENCOUNTER — Ambulatory Visit (INDEPENDENT_AMBULATORY_CARE_PROVIDER_SITE_OTHER): Payer: 59 | Admitting: Bariatrics

## 2019-01-22 ENCOUNTER — Encounter (INDEPENDENT_AMBULATORY_CARE_PROVIDER_SITE_OTHER): Payer: Self-pay | Admitting: Bariatrics

## 2019-01-22 ENCOUNTER — Other Ambulatory Visit: Payer: Self-pay

## 2019-01-22 VITALS — BP 126/74 | HR 68 | Temp 98.6°F | Ht 71.0 in | Wt 210.0 lb

## 2019-01-22 DIAGNOSIS — Z9189 Other specified personal risk factors, not elsewhere classified: Secondary | ICD-10-CM | POA: Diagnosis not present

## 2019-01-22 DIAGNOSIS — E559 Vitamin D deficiency, unspecified: Secondary | ICD-10-CM | POA: Diagnosis not present

## 2019-01-22 DIAGNOSIS — R7303 Prediabetes: Secondary | ICD-10-CM | POA: Diagnosis not present

## 2019-01-22 DIAGNOSIS — Z6830 Body mass index (BMI) 30.0-30.9, adult: Secondary | ICD-10-CM

## 2019-01-22 DIAGNOSIS — E669 Obesity, unspecified: Secondary | ICD-10-CM | POA: Diagnosis not present

## 2019-01-22 MED ORDER — VITAMIN D (ERGOCALCIFEROL) 1.25 MG (50000 UNIT) PO CAPS: 50000.0000 [IU] | ORAL_CAPSULE | ORAL | 0 refills | Status: DC

## 2019-01-26 ENCOUNTER — Encounter (INDEPENDENT_AMBULATORY_CARE_PROVIDER_SITE_OTHER): Payer: Self-pay | Admitting: Bariatrics

## 2019-01-26 NOTE — Progress Notes (Signed)
Office: [phone]  /  Fax: [phone]   HPI:   Chief Complaint: OBESITY Shane Wood is here to discuss his progress with his obesity treatment plan. He is following a lower carbohydrate, vegetable and lean protein rich diet plan and is following his eating plan approximately 90% of the time. He states he is exercising on a [employer] 35-40 minutes 5-6 times per week. Shane Wood had an IC at his last visit and was put on a low carbohydrate diet. She feels like he can stay on his diet for 2 additional days. His weight is 210 lb (95.3 kg) today and has had a weight loss of 5 pounds over a period of 2 weeks since his last visit. He has lost 6 lbs since starting treatment with [name].  Vitamin D deficiency Shane Wood has a diagnosis of Vitamin D deficiency. He is currently taking prescription Vit D and denies nausea, vomiting or muscle weakness.  Pre-Diabetes Shane Wood has a diagnosis of prediabetes based on his elevated Hgb A1c and was informed this puts him at greater risk of developing diabetes. He reports no polyphagia and continues to work on diet and exercise to decrease risk of diabetes. He denies nausea or hypoglycemia.  At risk for diabetes Shane Wood is at higher than average risk for developing diabetes due to his obesity. He currently denies polyuria or polydipsia.  ASSESSMENT AND PLAN:  Vitamin D deficiency - Plan: Vitamin D, Ergocalciferol, (DRISDOL) 1.25 MG (50000 UT) CAPS capsule  Prediabetes  At risk for diabetes mellitus  Class 1 obesity with serious comorbidity and body mass index (BMI) of 30.0 to 30.9 in adult, unspecified obesity type  PLAN:  Vitamin D Deficiency [name] was informed that low Vitamin D levels contributes to fatigue and are associated with obesity, breast, and colon cancer. He agrees to continue to take prescription Vit D @ 50,000 IU every week #4 with 0 refills and will follow-up for routine testing of Vitamin D, at least 2-3 times per year. He was informed of the  risk of over-replacement of Vitamin D and agrees to not increase his dose unless he discusses this with [name] first. Alphonsus agrees to follow-up with our clinic in 2 weeks.  Pre-Diabetes Shane Wood will continue to work on weight loss, exercise, and decreasing simple carbohydrates in his diet to help decrease the risk of diabetes. We dicussed metformin including benefits and risks. He was informed that eating too many simple carbohydrates or too many calories at one sitting increases the likelihood of GI side effects. Shane Wood was instructed to decrease carbohydrates, increase protein, and increase exercise. He will follow-up with [name] as directed to monitor his progress.  Diabetes risk counseling Shane Wood was given extended (15 minutes) diabetes prevention counseling today. He is 63 y.o. male and has risk factors for diabetes including obesity. We discussed intensive lifestyle modifications today with an emphasis on weight loss as well as increasing exercise and decreasing simple carbohydrates in his diet.  Obesity Shane Wood is currently in the action stage of change. As such, his goal is to continue with weight loss efforts He has agreed to follow a lower carbohydrate, vegetable and lean protein rich diet plan. Shane Wood will work on meal planning and intentional eating. Shane Wood has been doing well with exercise and added weights for weight loss and overall health benefits. We discussed the following Behavioral Modification Strategies today: increasing lean protein intake, decreasing simple carbohydrates, increasing vegetables, increase H20 intake, decrease eating out, no skipping meals, work on meal planning and easy cooking plans,  keeping healthy foods in the home, and planning for success.  Shane Wood has agreed to follow-up with our clinic in 2 weeks. He was informed of the importance of frequent follow-up visits to maximize his success with intensive lifestyle modifications for his multiple health conditions.   ALLERGIES: No Known Allergies  MEDICATIONS: Current Outpatient Medications on File Prior to Visit  Medication Sig Dispense Refill  . Ascorbic Acid (VITAMIN C) 1000 MG tablet Take 1,000 mg by mouth daily.    Shane Wood atorvastatin (LIPITOR) 80 MG tablet TAKE 1 TABLET (80 MG TOTAL) BY MOUTH DAILY AT 6 PM. 90 tablet 1  . clopidogrel (PLAVIX) 75 MG tablet Take 4 tablets (300 mg total) on Day 1 only, then take 1 tablet (75 mg total) once a day 90 tablet 3  . Cyanocobalamin (VITAMIN B 12 PO) Take 1 tablet by mouth daily.    Shane Wood ezetimibe (ZETIA) 10 MG tablet Take 1 tablet (10 mg total) by mouth daily. 90 tablet 3  . metoprolol tartrate (LOPRESSOR) 25 MG tablet TAKE HALF A TABLET (12.5 MG TOTAL) BY MOUTH 2 (TWO) TIMES DAILY. 90 tablet 7  . Multiple Vitamin (MULTIVITAMIN) tablet Take 1 tablet by mouth daily.      . nitroGLYCERIN (NITROSTAT) 0.4 MG SL tablet Place 1 tablet (0.4 mg total) under the tongue every 5 (five) minutes x 3 doses as needed for chest pain. 25 tablet 3  . pantoprazole (PROTONIX) 40 MG tablet Take 1 tablet (40 mg total) by mouth daily. 90 tablet 3   No current facility-administered medications on file prior to visit.     PAST MEDICAL HISTORY: Past Medical History:  Diagnosis Date  . Allergy   . BACK PAIN   . ELEVATED BP READING WITHOUT DX HYPERTENSION 05/03/2008   Qualifier: Diagnosis of  By: Larose Kells MD, [place] GERD (gastroesophageal reflux disease)   . H/O cardiovascular stress test 2005    (-)  . Hyperlipidemia   . INSOMNIA-SLEEP DISORDER-UNSPEC   . PVC (premature ventricular contraction)     PAST SURGICAL HISTORY: Past Surgical History:  Procedure Laterality Date  . ARTERY REPAIR Right 08/23/2017   Procedure: EXPLORATION RIGHT RADIAL ARTERY,  RIGHT FOREARM FASCIOTOMY;  Surgeon: Elam Dutch, MD;  Location: [facility] OR;  Service: Vascular;  Laterality: Right;  . CHOLECYSTECTOMY    . CORONARY STENT INTERVENTION N/A 08/23/2017   Procedure: CORONARY STENT INTERVENTION;   Surgeon: Jettie Booze, MD;  Location: [facility];  Service: Cardiovascular;  Laterality: N/A;  . LEFT HEART CATH AND CORONARY ANGIOGRAPHY N/A 08/23/2017   Procedure: LEFT HEART CATH AND CORONARY ANGIOGRAPHY;  Surgeon: Jettie Booze, MD;  Location: [place];  Service: Cardiovascular;  Laterality: N/A;  . POLYPECTOMY    . TONSILLECTOMY     age 63 y/o    SOCIAL HISTORY: Social History   Tobacco Use  . Smoking status: Former Smoker    Quit date: 09/24/1988    Years since quitting: 30.3  . Smokeless tobacco: Former Systems developer    Types: Chew    Quit date: 1970  Substance Use Topics  . Alcohol use: Yes    Alcohol/week: 4.0 standard drinks    Types: 4 Glasses of wine per week    Comment: occasionally on weekends  . Drug use: No    FAMILY HISTORY: Family History  Problem Relation Age of Onset  . Lung cancer Father        smoker  . Diabetes Maternal Grandmother   .  Diabetes Maternal Grandfather   . Hypertension Mother   . Depression Mother   . Obesity Mother   . Heart attack Other        uncle MI at age 51s  . Stroke Other        GM, in her 36  . Colon cancer Neg Hx   . Prostate cancer Neg Hx   . Colon polyps Neg Hx   . Esophageal cancer Neg Hx   . Rectal cancer Neg Hx   . Stomach cancer Neg Hx   . Pancreatic cancer Neg Hx    ROS: Review of Systems  Gastrointestinal: Negative for nausea and vomiting.  Musculoskeletal:       Negative for muscle weakness.  Endo/Heme/Allergies:       Negative for hypoglycemia.   PHYSICAL EXAM: Blood pressure 126/74, pulse 68, temperature 98.6 F (37 C), temperature source Oral, height 5\' 11"  (1.803 m), weight 210 lb (95.3 kg), SpO2 96 %. Body mass index is 29.29 kg/m. Physical Exam Vitals signs reviewed.  Constitutional:      Appearance: Normal appearance. He is obese.  Cardiovascular:     Rate and Rhythm: Normal rate.     Pulses: Normal pulses.  Pulmonary:     Effort: Pulmonary effort is normal.      Breath sounds: Normal breath sounds.  Musculoskeletal: Normal range of motion.  Skin:    General: Skin is warm and dry.  Neurological:     Mental Status: He is alert and oriented to person, place, and time.  Psychiatric:        Behavior: Behavior normal.   RECENT LABS AND TESTS: BMET    Component Value Date/Time   NA 139 09/18/2018 0801   K 4.6 09/18/2018 0801   CL 104 09/18/2018 0801   CO2 22 09/18/2018 0801   GLUCOSE 99 09/18/2018 0801   GLUCOSE 149 (H) 08/24/2017 0257   BUN 17 09/18/2018 0801   CREATININE 1.06 09/18/2018 0801   CALCIUM 9.2 09/18/2018 0801   GFRNONAA 74 09/18/2018 0801   GFRAA 86 09/18/2018 0801   Lab Results  Component Value Date   HGBA1C 5.8 12/22/2018   HGBA1C 5.6 09/18/2018   HGBA1C 5.7 (H) 03/04/2018   HGBA1C 5.5 11/26/2017   HGBA1C 5.7 (H) 08/08/2017   Lab Results  Component Value Date   INSULIN 15.3 09/18/2018   INSULIN 17.8 03/04/2018   INSULIN 8.7 11/26/2017   INSULIN 11.8 08/08/2017   CBC    Component Value Date/Time   WBC 7.1 12/22/2018 1422   RBC 4.68 12/22/2018 1422   HGB 13.8 12/22/2018 1422   HCT 40.9 12/22/2018 1422   PLT 213.0 12/22/2018 1422   MCV 87.4 12/22/2018 1422   MCH 28.0 08/24/2017 0257   MCHC 33.8 12/22/2018 1422   RDW 14.6 12/22/2018 1422   LYMPHSABS 2.1 12/22/2018 1422   MONOABS 0.5 12/22/2018 1422   EOSABS 0.2 12/22/2018 1422   BASOSABS 0.0 12/22/2018 1422   Iron/TIBC/Ferritin/ %Sat    Component Value Date/Time   IRON 103 12/22/2018 1422   FERRITIN 96.8 12/22/2018 1422   Lipid Panel     Component Value Date/Time   CHOL 174 09/18/2018 0801   TRIG 196 (H) 09/18/2018 0801   HDL 39 (L) 09/18/2018 0801   CHOLHDL 3.8 10/04/2017 0802   CHOLHDL 4 07/15/2017 0828   VLDL 41.0 (H) 07/15/2017 0828   LDLCALC 96 09/18/2018 0801   LDLDIRECT 105.0 07/15/2017 0828   Hepatic Function Panel     Component  Value Date/Time   PROT 6.5 09/18/2018 0801   ALBUMIN 4.4 09/18/2018 0801   AST 22 09/18/2018 0801   ALT  34 09/18/2018 0801   ALKPHOS 51 09/18/2018 0801   BILITOT 0.4 09/18/2018 0801   BILIDIR 0.11 10/04/2017 0802      Component Value Date/Time   TSH 2.080 08/08/2017 1016   TSH 2.14 07/15/2017 0828   TSH 1.62 07/08/2015 1405   Results for Drab, Christoffer A "PAT" (MRN [id number]) as of 01/26/2019 11:10  Ref. Range 09/18/2018 08:01  Vitamin D, 25-Hydroxy Latest Ref Range: 30.0 - 100.0 ng/mL 34.5   OBESITY BEHAVIORAL INTERVENTION VISIT  Today's visit was #23  Starting weight: 216 lbs Starting date: 08/08/2017 Today's weight: 210 lbs  Today's date: 01/22/2019 Total lbs lost to date: 6    01/22/2019  Height 5\' 11"  (1.803 m)  Weight 210 lb (95.3 kg)  BMI (Calculated) 29.3  BLOOD PRESSURE - SYSTOLIC [id number]  BLOOD PRESSURE - DIASTOLIC 74   Body Fat % [id number] %  Total Body Water (lbs) 105.8 lbs   ASK: We discussed the diagnosis of obesity with [name] today and Ronal agreed to give [name] permission to discuss obesity behavioral modification therapy today.  ASSESS: Melvan has the diagnosis of obesity and his BMI today is 29.4. Kadmiel is in the action stage of change.   ADVISE: Perle was educated on the multiple health risks of obesity as well as the benefit of weight loss to improve his health. He was advised of the need for long term treatment and the importance of lifestyle modifications to improve his current health and to decrease his risk of future health problems.  AGREE: Multiple dietary modification options and treatment options were discussed and  Kap agreed to follow the recommendations documented in the above note.  ARRANGE: Konstantinos was educated on the importance of frequent visits to treat obesity as outlined per CMS and USPSTF guidelines and agreed to schedule his next follow up appointment today.  Migdalia Dk, am acting as Location manager for [employer], DO   I have reviewed the above documentation for accuracy and completeness, and I agree with the  above. -Jearld Lesch, DO

## 2019-01-30 ENCOUNTER — Other Ambulatory Visit (INDEPENDENT_AMBULATORY_CARE_PROVIDER_SITE_OTHER): Payer: Self-pay | Admitting: Bariatrics

## 2019-01-30 DIAGNOSIS — E559 Vitamin D deficiency, unspecified: Secondary | ICD-10-CM

## 2019-02-04 ENCOUNTER — Other Ambulatory Visit: Payer: Self-pay

## 2019-02-04 ENCOUNTER — Other Ambulatory Visit: Payer: 59 | Admitting: *Deleted

## 2019-02-04 DIAGNOSIS — E782 Mixed hyperlipidemia: Secondary | ICD-10-CM

## 2019-02-04 DIAGNOSIS — I252 Old myocardial infarction: Secondary | ICD-10-CM

## 2019-02-04 DIAGNOSIS — I25118 Atherosclerotic heart disease of native coronary artery with other forms of angina pectoris: Secondary | ICD-10-CM

## 2019-02-04 LAB — LIPID PANEL
Chol/HDL Ratio: 3.2 ratio (ref 0.0–5.0)
Cholesterol, Total: 124 mg/dL (ref 100–199)
HDL: 39 mg/dL — ABNORMAL LOW (ref >39)
LDL Chol Calc (NIH): 59 mg/dL (ref 0–99)
Triglycerides: 150 mg/dL — ABNORMAL HIGH (ref 0–149)
VLDL Cholesterol Cal: 26 mg/dL (ref 5–40)

## 2019-02-05 ENCOUNTER — Ambulatory Visit (INDEPENDENT_AMBULATORY_CARE_PROVIDER_SITE_OTHER): Payer: 59 | Admitting: Bariatrics

## 2019-02-05 ENCOUNTER — Encounter (INDEPENDENT_AMBULATORY_CARE_PROVIDER_SITE_OTHER): Payer: Self-pay | Admitting: Bariatrics

## 2019-02-05 VITALS — BP 139/76 | HR 59 | Temp 97.6°F | Ht 71.0 in | Wt 208.0 lb

## 2019-02-05 DIAGNOSIS — E559 Vitamin D deficiency, unspecified: Secondary | ICD-10-CM

## 2019-02-05 DIAGNOSIS — Z6830 Body mass index (BMI) 30.0-30.9, adult: Secondary | ICD-10-CM

## 2019-02-05 DIAGNOSIS — R7303 Prediabetes: Secondary | ICD-10-CM | POA: Diagnosis not present

## 2019-02-05 DIAGNOSIS — E669 Obesity, unspecified: Secondary | ICD-10-CM | POA: Diagnosis not present

## 2019-02-06 ENCOUNTER — Encounter (INDEPENDENT_AMBULATORY_CARE_PROVIDER_SITE_OTHER): Payer: Self-pay | Admitting: Family Medicine

## 2019-02-08 NOTE — Telephone Encounter (Signed)
Please have Orson Slick look into this.  Thank you

## 2019-02-09 NOTE — Telephone Encounter (Signed)
Shane Wood has taken care of this. Thanks

## 2019-02-10 ENCOUNTER — Encounter (INDEPENDENT_AMBULATORY_CARE_PROVIDER_SITE_OTHER): Payer: Self-pay | Admitting: Bariatrics

## 2019-02-10 NOTE — Progress Notes (Signed)
Office: [phone]  /  Fax: [phone]   HPI:   Chief Complaint: OBESITY Shane Wood is here to discuss his progress with his obesity treatment plan. He is following a lower carbohydrate, vegetable and lean protein rich diet plan and is following his eating plan approximately 90% of the time. He states he is exercising on the  [place] bike/walking 40-45 minutes 4-5 times per week. Shane Wood is down an additional 2 lbs since his last visit. His weight is 208 lb (94.3 kg) today and has had a weight loss of 2 pounds over a period of 2 weeks since his last visit. He has lost 8 lbs since starting treatment with [name].  Vitamin D deficiency Shane Wood has a diagnosis of Vitamin D deficiency. Last Vitamin D 34.5 on 09/18/2018. He is currently taking Vit D and denies nausea, vomiting or muscle weakness.  Pre-Diabetes Shane Wood has a diagnosis of prediabetes based on his elevated Hgb A1c and was informed this puts him at greater risk of developing diabetes. Last A1c 5.8 on 12/22/2018 with an insulin of 15.3 on 09/18/2018. He is not taking metformin currently and continues to work on diet and exercise to decrease risk of diabetes. He denies nausea or hypoglycemia.  ASSESSMENT AND PLAN:  Vitamin D deficiency  Prediabetes  Class 1 obesity with serious comorbidity and body mass index (BMI) of 30.0 to 30.9 in adult, unspecified obesity type - Starting BMI greater then 30  PLAN:  Vitamin D Deficiency Shane Wood was informed that low Vitamin D levels contributes to fatigue and are associated with obesity, breast, and colon cancer. He agrees to continue taking Vit D and will follow-up for routine testing of Vitamin D, at least 2-3 times per year. He was informed of the risk of over-replacement of Vitamin D and agrees to not increase his dose unless he discusses this with [name] first. Shane Wood agrees to follow-up with our clinic in 2 weeks.  Pre-Diabetes Shane Wood will continue to work on weight loss, exercise, and  decreasing simple carbohydrates in his diet to help decrease the risk of diabetes. We dicussed metformin including benefits and risks. He was informed that eating too many simple carbohydrates or too many calories at one sitting increases the likelihood of GI side effects. Shane Wood was instructed to decrease carbohydrates, increase protein, increase healthy fats, and increase activity. He will follow-up with [name] as directed to monitor his progress.  I spent > than 50% of the 15 minute visit on counseling as documented in the note.  Obesity Shane Wood is currently in the action stage of change. As such, his goal is to continue with weight loss efforts. He has agreed to follow a lower carbohydrate, vegetable and lean protein rich diet plan. Shane Wood will work on meal planning, intentional eating, and increasing his water intake. Shane Wood has been instructed to work up to a goal of 150 minutes of combined cardio and strengthening exercise per week for weight loss and overall health benefits. We discussed the following Behavioral Modification Strategies today: increasing lean protein intake, decreasing simple carbohydrates, increasing vegetables, increase H20 intake, decrease eating out, no skipping meals, work on meal planning and easy cooking plans, keeping healthy foods in the home, and planning for success.  Shane Wood has agreed to follow-up with our clinic in 2 weeks. He was informed of the importance of frequent follow-up visits to maximize his success with intensive lifestyle modifications for his multiple health conditions.  ALLERGIES: No Known Allergies  MEDICATIONS: Current Outpatient Medications on File Prior to Visit  Medication Sig Dispense Refill  . Ascorbic Acid (VITAMIN C) 1000 MG tablet Take 1,000 mg by mouth daily.    [name] atorvastatin (LIPITOR) 80 MG tablet TAKE 1 TABLET (80 MG TOTAL) BY MOUTH DAILY AT 6 PM. 90 tablet 1  . clopidogrel (PLAVIX) 75 MG tablet Take 4 tablets (300 mg total) on  Day 1 only, then take 1 tablet (75 mg total) once a day 90 tablet 3  . Cyanocobalamin (VITAMIN B 12 PO) Take 1 tablet by mouth daily.    [name] ezetimibe (ZETIA) 10 MG tablet Take 1 tablet (10 mg total) by mouth daily. 90 tablet 3  . metoprolol tartrate (LOPRESSOR) 25 MG tablet TAKE HALF A TABLET (12.5 MG TOTAL) BY MOUTH 2 (TWO) TIMES DAILY. 90 tablet 7  . Multiple Vitamin (MULTIVITAMIN) tablet Take 1 tablet by mouth daily.      . nitroGLYCERIN (NITROSTAT) 0.4 MG SL tablet Place 1 tablet (0.4 mg total) under the tongue every 5 (five) minutes x 3 doses as needed for chest pain. 25 tablet 3  . pantoprazole (PROTONIX) 40 MG tablet Take 1 tablet (40 mg total) by mouth daily. 90 tablet 3  . Vitamin D, Ergocalciferol, (DRISDOL) 1.25 MG (50000 UT) CAPS capsule Take 1 capsule (50,000 Units total) by mouth every 7 (seven) days. 4 capsule 0   No current facility-administered medications on file prior to visit.     PAST MEDICAL HISTORY: Past Medical History:  Diagnosis Date  . Allergy   . BACK PAIN   . ELEVATED BP READING WITHOUT DX HYPERTENSION 05/03/2008   Qualifier: Diagnosis of  By: Larose Kells MD, [place] GERD (gastroesophageal reflux disease)   . H/O cardiovascular stress test 2005    (-)  . Hyperlipidemia   . INSOMNIA-SLEEP DISORDER-UNSPEC   . PVC (premature ventricular contraction)     PAST SURGICAL HISTORY: Past Surgical History:  Procedure Laterality Date  . ARTERY REPAIR Right 08/23/2017   Procedure: EXPLORATION RIGHT RADIAL ARTERY,  RIGHT FOREARM FASCIOTOMY;  Surgeon: Elam Dutch, MD;  Location: [facility] OR;  Service: Vascular;  Laterality: Right;  . CHOLECYSTECTOMY    . CORONARY STENT INTERVENTION N/A 08/23/2017   Procedure: CORONARY STENT INTERVENTION;  Surgeon: Jettie Booze, MD;  Location: [place] [facility];  Service: Cardiovascular;  Laterality: N/A;  . LEFT HEART CATH AND CORONARY ANGIOGRAPHY N/A 08/23/2017   Procedure: LEFT HEART CATH AND CORONARY ANGIOGRAPHY;  Surgeon: Jettie Booze, MD;  Location: [place];  Service: Cardiovascular;  Laterality: N/A;  . POLYPECTOMY    . TONSILLECTOMY     age 63 y/o    SOCIAL HISTORY: Social History   Tobacco Use  . Smoking status: Former Smoker    Quit date: 09/24/1988    Years since quitting: 30.4  . Smokeless tobacco: Former Systems developer    Types: Chew    Quit date: 1970  Substance Use Topics  . Alcohol use: Yes    Alcohol/week: 4.0 standard drinks    Types: 4 Glasses of wine per week    Comment: occasionally on weekends  . Drug use: No    FAMILY HISTORY: Family History  Problem Relation Age of Onset  . Lung cancer Father        smoker  . Diabetes Maternal Grandmother   . Diabetes Maternal Grandfather   . Hypertension Mother   . Depression Mother   . Obesity Mother   . Heart attack Other        uncle  MI at age 53s  . Stroke Other        GM, in her 37  . Colon cancer Neg Hx   . Prostate cancer Neg Hx   . Colon polyps Neg Hx   . Esophageal cancer Neg Hx   . Rectal cancer Neg Hx   . Stomach cancer Neg Hx   . Pancreatic cancer Neg Hx    ROS: Review of Systems  Gastrointestinal: Negative for nausea and vomiting.  Musculoskeletal:       Negative for muscle weakness.  Endo/Heme/Allergies:       Negative for hypoglycemia.   PHYSICAL EXAM: Blood pressure 139/76, pulse (!) 59, temperature 97.6 F (36.4 C), temperature source Oral, height 5\' 11"  (1.803 m), weight 208 lb (94.3 kg), SpO2 98 %. Body mass index is 29.01 kg/m. Physical Exam Vitals signs reviewed.  Constitutional:      Appearance: Normal appearance. He is obese.  Cardiovascular:     Rate and Rhythm: Normal rate.     Pulses: Normal pulses.  Pulmonary:     Effort: Pulmonary effort is normal.     Breath sounds: Normal breath sounds.  Musculoskeletal: Normal range of motion.  Skin:    General: Skin is warm and dry.  Neurological:     Mental Status: He is alert and oriented to person, place, and time.  Psychiatric:         Behavior: Behavior normal.   RECENT LABS AND TESTS: BMET    Component Value Date/Time   NA 139 09/18/2018 0801   K 4.6 09/18/2018 0801   CL 104 09/18/2018 0801   CO2 22 09/18/2018 0801   GLUCOSE 99 09/18/2018 0801   GLUCOSE 149 (H) 08/24/2017 0257   BUN 17 09/18/2018 0801   CREATININE 1.06 09/18/2018 0801   CALCIUM 9.2 09/18/2018 0801   GFRNONAA 74 09/18/2018 0801   GFRAA 86 09/18/2018 0801   Lab Results  Component Value Date   HGBA1C 5.8 12/22/2018   HGBA1C 5.6 09/18/2018   HGBA1C 5.7 (H) 03/04/2018   HGBA1C 5.5 11/26/2017   HGBA1C 5.7 (H) 08/08/2017   Lab Results  Component Value Date   INSULIN 15.3 09/18/2018   INSULIN 17.8 03/04/2018   INSULIN 8.7 11/26/2017   INSULIN 11.8 08/08/2017   CBC    Component Value Date/Time   WBC 7.1 12/22/2018 1422   RBC 4.68 12/22/2018 1422   HGB 13.8 12/22/2018 1422   HCT 40.9 12/22/2018 1422   PLT 213.0 12/22/2018 1422   MCV 87.4 12/22/2018 1422   MCH 28.0 08/24/2017 0257   MCHC 33.8 12/22/2018 1422   RDW 14.6 12/22/2018 1422   LYMPHSABS 2.1 12/22/2018 1422   MONOABS 0.5 12/22/2018 1422   EOSABS 0.2 12/22/2018 1422   BASOSABS 0.0 12/22/2018 1422   Iron/TIBC/Ferritin/ %Sat    Component Value Date/Time   IRON 103 12/22/2018 1422   FERRITIN 96.8 12/22/2018 1422   Lipid Panel     Component Value Date/Time   CHOL 124 02/04/2019 0755   TRIG 150 (H) 02/04/2019 0755   HDL 39 (L) 02/04/2019 0755   CHOLHDL 3.2 02/04/2019 0755   CHOLHDL 4 07/15/2017 0828   VLDL 41.0 (H) 07/15/2017 0828   LDLCALC 59 02/04/2019 0755   LDLDIRECT 105.0 07/15/2017 0828   Hepatic Function Panel     Component Value Date/Time   PROT 6.5 09/18/2018 0801   ALBUMIN 4.4 09/18/2018 0801   AST 22 09/18/2018 0801   ALT 34 09/18/2018 0801   ALKPHOS 51 09/18/2018  0801   BILITOT 0.4 09/18/2018 0801   BILIDIR 0.11 10/04/2017 0802      Component Value Date/Time   TSH 2.080 08/08/2017 1016   TSH 2.14 07/15/2017 0828   TSH 1.62 07/08/2015 1405    Results for Soh, Sou A "PAT" (MRN [id number]) as of 02/10/2019 12:12  Ref. Range 09/18/2018 08:01  Vitamin D, 25-Hydroxy Latest Ref Range: 30.0 - 100.0 ng/mL 34.5   OBESITY BEHAVIORAL INTERVENTION VISIT  Today's visit was #24  Starting weight: 216 lbs Starting date: 08/08/2017 Today's weight: 208 lbs  Today's date: 02/05/2019 Total lbs lost to date: 8    02/05/2019  Height 5\' 11"  (1.803 m)  Weight 208 lb (94.3 kg)  BMI (Calculated) 29.02  BLOOD PRESSURE - SYSTOLIC [id number]  BLOOD PRESSURE - DIASTOLIC 76   Body Fat % [id number] %  Total Body Water (lbs) 103.2 lbs   ASK: We discussed the diagnosis of obesity with Shane Wood today and Mouhamed agreed to give [name] permission to discuss obesity behavioral modification therapy today.  ASSESS: Rachit has the diagnosis of obesity and his BMI today is 29.0. Donsha is in the action stage of change.   ADVISE: Shane Wood was educated on the multiple health risks of obesity as well as the benefit of weight loss to improve his health. He was advised of the need for long term treatment and the importance of lifestyle modifications to improve his current health and to decrease his risk of future health problems.  AGREE: Multiple dietary modification options and treatment options were discussed and  Stylianos agreed to follow the recommendations documented in the above note.  ARRANGE: Griezmann was educated on the importance of frequent visits to treat obesity as outlined per CMS and USPSTF guidelines and agreed to schedule his next follow up appointment today.  Migdalia Dk, am acting as Location manager for [employer], DO  I have reviewed the above documentation for accuracy and completeness, and I agree with the above. -Jearld Lesch, DO

## 2019-02-18 ENCOUNTER — Telehealth (INDEPENDENT_AMBULATORY_CARE_PROVIDER_SITE_OTHER): Payer: 59 | Admitting: Family Medicine

## 2019-02-18 ENCOUNTER — Other Ambulatory Visit: Payer: Self-pay

## 2019-02-18 ENCOUNTER — Encounter (INDEPENDENT_AMBULATORY_CARE_PROVIDER_SITE_OTHER): Payer: Self-pay | Admitting: Family Medicine

## 2019-02-18 DIAGNOSIS — E559 Vitamin D deficiency, unspecified: Secondary | ICD-10-CM

## 2019-02-18 DIAGNOSIS — E669 Obesity, unspecified: Secondary | ICD-10-CM

## 2019-02-18 DIAGNOSIS — E66811 Obesity, class 1: Secondary | ICD-10-CM

## 2019-02-18 DIAGNOSIS — Z6830 Body mass index (BMI) 30.0-30.9, adult: Secondary | ICD-10-CM | POA: Diagnosis not present

## 2019-02-18 MED ORDER — VITAMIN D (ERGOCALCIFEROL) 1.25 MG (50000 UNIT) PO CAPS: 50000.0000 [IU] | ORAL_CAPSULE | ORAL | 0 refills | Status: DC

## 2019-02-19 NOTE — Progress Notes (Signed)
Office: [phone]  /  Fax: [phone] TeleHealth Visit:  Shane Wood has verbally consented to this TeleHealth visit today. The patient is located at home, the provider is located at the [employer] [facility] office. The participants in this visit include the listed provider and patient. The visit was conducted today via face time.  HPI:   Chief Complaint: OBESITY Shane Wood is here to discuss his progress with his obesity treatment plan. He is on the lower carbohydrate, vegetable and lean protein rich diet plan and is following his eating plan approximately 90 % of the time. He states he is riding peloton bike and walking for 30-45 minutes 4-5 times per week. Shane Wood continues to do well on his Low carbohydrate plan. He states he has lost 5 lbs in the last month. He is exercising regularly and feels well overall. His hunger is controlled. We were unable to weigh the patient today for this TeleHealth visit. He feels as if he has lost 2 lbs since his last visit. He has lost 8-10 lbs since starting treatment with [name].  Vitamin D Deficiency Shane Wood has a diagnosis of vitamin D deficiency. He is currently taking prescription Vit D. Last Vit D level was not at goal and not yet controlled. He denies nausea, vomiting or muscle weakness.  ASSESSMENT AND PLAN:  Class 1 obesity with serious comorbidity and body mass index (BMI) of 30.0 to 30.9 in adult, unspecified obesity type  Vitamin D deficiency - Plan: Vitamin D, Ergocalciferol, (DRISDOL) 1.25 MG (50000 UT) CAPS capsule  PLAN:  Vitamin D Deficiency [name] was informed that low vitamin D levels contributes to fatigue and are associated with obesity, breast, and colon cancer. Shane Wood agrees to continue taking prescription Vit D 50,000 IU every week #4 and we will refill for 1 month. He will follow up for routine testing of vitamin D, at least 2-3 times per year. He was informed of the risk of over-replacement of vitamin D and agrees  to not increase his dose unless he discusses this with [name] first. We will recheck labs at his next visit. Eros agrees to follow up with our clinic in 3 weeks.  Obesity Shane Wood is currently in the action stage of change. As such, his goal is to continue with weight loss efforts He has agreed to follow a lower carbohydrate, vegetable and lean protein rich diet plan Shane Wood has been instructed to work up to a goal of 150 minutes of combined cardio and strengthening exercise per week for weight loss and overall health benefits. We discussed the following Behavioral Modification Strategies today: increasing lean protein intake, decreasing simple carbohydrates  and work on meal planning and easy cooking plans   Shane Wood has agreed to follow up with our clinic in 3 weeks. He was informed of the importance of frequent follow up visits to maximize his success with intensive lifestyle modifications for his multiple health conditions.  ALLERGIES: No Known Allergies  MEDICATIONS: Current Outpatient Medications on File Prior to Visit  Medication Sig Dispense Refill  . Ascorbic Acid (VITAMIN C) 1000 MG tablet Take 1,000 mg by mouth daily.    Shane Wood atorvastatin (LIPITOR) 80 MG tablet TAKE 1 TABLET (80 MG TOTAL) BY MOUTH DAILY AT 6 PM. 90 tablet 1  . clopidogrel (PLAVIX) 75 MG tablet Take 4 tablets (300 mg total) on Day 1 only, then take 1 tablet (75 mg total) once a day 90 tablet 3  . Cyanocobalamin (VITAMIN B 12 PO) Take 1 tablet by  mouth daily.    Shane Wood ezetimibe (ZETIA) 10 MG tablet Take 1 tablet (10 mg total) by mouth daily. 90 tablet 3  . metoprolol tartrate (LOPRESSOR) 25 MG tablet TAKE HALF A TABLET (12.5 MG TOTAL) BY MOUTH 2 (TWO) TIMES DAILY. 90 tablet 7  . Multiple Vitamin (MULTIVITAMIN) tablet Take 1 tablet by mouth daily.      . nitroGLYCERIN (NITROSTAT) 0.4 MG SL tablet Place 1 tablet (0.4 mg total) under the tongue every 5 (five) minutes x 3 doses as needed for chest pain. 25 tablet 3  .  pantoprazole (PROTONIX) 40 MG tablet Take 1 tablet (40 mg total) by mouth daily. 90 tablet 3   No current facility-administered medications on file prior to visit.     PAST MEDICAL HISTORY: Past Medical History:  Diagnosis Date  . Allergy   . BACK PAIN   . ELEVATED BP READING WITHOUT DX HYPERTENSION 05/03/2008   Qualifier: Diagnosis of  By: Larose Kells MD, [place] GERD (gastroesophageal reflux disease)   . H/O cardiovascular stress test 2005    (-)  . Hyperlipidemia   . INSOMNIA-SLEEP DISORDER-UNSPEC   . PVC (premature ventricular contraction)     PAST SURGICAL HISTORY: Past Surgical History:  Procedure Laterality Date  . ARTERY REPAIR Right 08/23/2017   Procedure: EXPLORATION RIGHT RADIAL ARTERY,  RIGHT FOREARM FASCIOTOMY;  Surgeon: Elam Dutch, MD;  Location: [facility] OR;  Service: Vascular;  Laterality: Right;  . CHOLECYSTECTOMY    . CORONARY STENT INTERVENTION N/A 08/23/2017   Procedure: CORONARY STENT INTERVENTION;  Surgeon: Jettie Booze, MD;  Location: [place] [facility];  Service: Cardiovascular;  Laterality: N/A;  . LEFT HEART CATH AND CORONARY ANGIOGRAPHY N/A 08/23/2017   Procedure: LEFT HEART CATH AND CORONARY ANGIOGRAPHY;  Surgeon: Jettie Booze, MD;  Location: [place] [facility];  Service: Cardiovascular;  Laterality: N/A;  . POLYPECTOMY    . TONSILLECTOMY     age 63 y/o    SOCIAL HISTORY: Social History   Tobacco Use  . Smoking status: Former Smoker    Quit date: 09/24/1988    Years since quitting: 30.4  . Smokeless tobacco: Former Systems developer    Types: Chew    Quit date: 1970  Substance Use Topics  . Alcohol use: Yes    Alcohol/week: 4.0 standard drinks    Types: 4 Glasses of wine per week    Comment: occasionally on weekends  . Drug use: No    FAMILY HISTORY: Family History  Problem Relation Age of Onset  . Lung cancer Father        smoker  . Diabetes Maternal Grandmother   . Diabetes Maternal Grandfather   . Hypertension Mother   . Depression  Mother   . Obesity Mother   . Heart attack Other        uncle MI at age 25s  . Stroke Other        GM, in her 28  . Colon cancer Neg Hx   . Prostate cancer Neg Hx   . Colon polyps Neg Hx   . Esophageal cancer Neg Hx   . Rectal cancer Neg Hx   . Stomach cancer Neg Hx   . Pancreatic cancer Neg Hx     ROS: Review of Systems  Constitutional: Positive for weight loss.  Gastrointestinal: Negative for nausea and vomiting.  Musculoskeletal:       Negative muscle weakness    PHYSICAL EXAM: Pt in no acute distress  RECENT  LABS AND TESTS: BMET    Component Value Date/Time   NA 139 09/18/2018 0801   K 4.6 09/18/2018 0801   CL 104 09/18/2018 0801   CO2 22 09/18/2018 0801   GLUCOSE 99 09/18/2018 0801   GLUCOSE 149 (H) 08/24/2017 0257   BUN 17 09/18/2018 0801   CREATININE 1.06 09/18/2018 0801   CALCIUM 9.2 09/18/2018 0801   GFRNONAA 74 09/18/2018 0801   GFRAA 86 09/18/2018 0801   Lab Results  Component Value Date   HGBA1C 5.8 12/22/2018   HGBA1C 5.6 09/18/2018   HGBA1C 5.7 (H) 03/04/2018   HGBA1C 5.5 11/26/2017   HGBA1C 5.7 (H) 08/08/2017   Lab Results  Component Value Date   INSULIN 15.3 09/18/2018   INSULIN 17.8 03/04/2018   INSULIN 8.7 11/26/2017   INSULIN 11.8 08/08/2017   CBC    Component Value Date/Time   WBC 7.1 12/22/2018 1422   RBC 4.68 12/22/2018 1422   HGB 13.8 12/22/2018 1422   HCT 40.9 12/22/2018 1422   PLT 213.0 12/22/2018 1422   MCV 87.4 12/22/2018 1422   MCH 28.0 08/24/2017 0257   MCHC 33.8 12/22/2018 1422   RDW 14.6 12/22/2018 1422   LYMPHSABS 2.1 12/22/2018 1422   MONOABS 0.5 12/22/2018 1422   EOSABS 0.2 12/22/2018 1422   BASOSABS 0.0 12/22/2018 1422   Iron/TIBC/Ferritin/ %Sat    Component Value Date/Time   IRON 103 12/22/2018 1422   FERRITIN 96.8 12/22/2018 1422   Lipid Panel     Component Value Date/Time   CHOL 124 02/04/2019 0755   TRIG 150 (H) 02/04/2019 0755   HDL 39 (L) 02/04/2019 0755   CHOLHDL 3.2 02/04/2019 0755    CHOLHDL 4 07/15/2017 0828   VLDL 41.0 (H) 07/15/2017 0828   LDLCALC 59 02/04/2019 0755   LDLDIRECT 105.0 07/15/2017 0828   Hepatic Function Panel     Component Value Date/Time   PROT 6.5 09/18/2018 0801   ALBUMIN 4.4 09/18/2018 0801   AST 22 09/18/2018 0801   ALT 34 09/18/2018 0801   ALKPHOS 51 09/18/2018 0801   BILITOT 0.4 09/18/2018 0801   BILIDIR 0.11 10/04/2017 0802      Component Value Date/Time   TSH 2.080 08/08/2017 1016   TSH 2.14 07/15/2017 0828   TSH 1.62 07/08/2015 1405      I, Trixie Dredge, am acting as transcriptionist for Dennard Nip, MD I have reviewed the above documentation for accuracy and completeness, and I agree with the above. -Dennard Nip, MD

## 2019-02-23 ENCOUNTER — Ambulatory Visit (INDEPENDENT_AMBULATORY_CARE_PROVIDER_SITE_OTHER): Payer: 59 | Admitting: Family Medicine

## 2019-03-12 ENCOUNTER — Encounter (INDEPENDENT_AMBULATORY_CARE_PROVIDER_SITE_OTHER): Payer: Self-pay | Admitting: Family Medicine

## 2019-03-12 ENCOUNTER — Ambulatory Visit (INDEPENDENT_AMBULATORY_CARE_PROVIDER_SITE_OTHER): Payer: 59 | Admitting: Family Medicine

## 2019-03-12 ENCOUNTER — Other Ambulatory Visit: Payer: Self-pay

## 2019-03-12 VITALS — BP 119/72 | HR 56 | Temp 97.9°F | Ht 71.0 in | Wt 207.0 lb

## 2019-03-12 DIAGNOSIS — E559 Vitamin D deficiency, unspecified: Secondary | ICD-10-CM

## 2019-03-12 DIAGNOSIS — E669 Obesity, unspecified: Secondary | ICD-10-CM

## 2019-03-12 DIAGNOSIS — Z6830 Body mass index (BMI) 30.0-30.9, adult: Secondary | ICD-10-CM

## 2019-03-12 DIAGNOSIS — I2510 Atherosclerotic heart disease of native coronary artery without angina pectoris: Secondary | ICD-10-CM | POA: Insufficient documentation

## 2019-03-12 DIAGNOSIS — E782 Mixed hyperlipidemia: Secondary | ICD-10-CM | POA: Diagnosis not present

## 2019-03-12 DIAGNOSIS — R7303 Prediabetes: Secondary | ICD-10-CM

## 2019-03-12 DIAGNOSIS — R739 Hyperglycemia, unspecified: Secondary | ICD-10-CM | POA: Insufficient documentation

## 2019-03-12 DIAGNOSIS — Z9189 Other specified personal risk factors, not elsewhere classified: Secondary | ICD-10-CM | POA: Diagnosis not present

## 2019-03-12 MED ORDER — VITAMIN D (ERGOCALCIFEROL) 1.25 MG (50000 UNIT) PO CAPS: 50000.0000 [IU] | ORAL_CAPSULE | ORAL | 0 refills | Status: DC

## 2019-03-16 ENCOUNTER — Encounter (INDEPENDENT_AMBULATORY_CARE_PROVIDER_SITE_OTHER): Payer: Self-pay | Admitting: Family Medicine

## 2019-03-16 NOTE — Progress Notes (Signed)
Office: [phone]  /  Fax: [phone]   HPI:   Chief Complaint: OBESITY Shane Wood is here to discuss his progress with his obesity treatment plan. He is followling a lower carbohydrate, vegetable and lean protein rich diet plan and is following his eating plan approximately 90% of the time. He states he is walking 30-45 minutes 6 times per week. Shane Wood is using his own low carb plan called "slow carb diet" with 1 cheat day. He is planning to enjoy the holiday season.  His weight is 207 lb (93.9 kg) today and has had a weight loss of 1 pound over a period of 5 weeks since his last visit. He has lost 9 lbs since starting treatment with [name].  Pre-Diabetes Shane Wood has a diagnosis of prediabetes based on his elevated Hgb A1c and was informed this puts him at greater risk of developing diabetes. He continues to work on diet and exercise to decrease risk of diabetes. He denies nausea or hypoglycemia.  Vitamin D deficiency Shane Wood has a diagnosis of Vitamin D deficiency. He is currently taking prescription Vit D and denies nausea, vomiting or muscle weakness.  At risk for osteopenia and osteoporosis Shane Wood is at higher risk of osteopenia and osteoporosis due to Vitamin D deficiency.   Dyslipidemia, Mixed Shane Wood has a diagnosis of dyslipidemia and is not at goal. He would benefit from GLP-1-RA.  ASSESSMENT AND PLAN:  Prediabetes  Vitamin D deficiency - Plan: Vitamin D, Ergocalciferol, (DRISDOL) 1.25 MG (50000 UT) CAPS capsule  Mixed dyslipidemia  At risk for osteoporosis  Class 1 obesity with serious comorbidity and body mass index (BMI) of 30.0 to 30.9 in adult, unspecified obesity type - Starting BMI greater then 30  PLAN:  Pre-Diabetes Shane Wood will continue to work on weight loss, exercise, and decreasing simple carbohydrates in his diet to help decrease the risk of diabetes. We dicussed metformin including benefits and risks. He was informed that eating too many simple  carbohydrates or too many calories at one sitting increases the likelihood of GI side effects. We discussed Rybelsus and Harinder was provided a pamphlet to read. He will follow-up as directed.  Vitamin D Deficiency Shane Wood was informed that low Vitamin D levels contributes to fatigue and are associated with obesity, breast, and colon cancer. He agrees to continue to take prescription Vit D @ 50,000 IU every week #4 with 0 refills and will follow-up for routine testing of Vitamin D, at least 2-3 times per year. He was informed of the risk of over-replacement of Vitamin D and agrees to not increase his dose unless he discusses this with [name] first. Shane Wood agrees to follow-up with our clinic in 2-3 weeks.  At risk for osteopenia and osteoporosis Shane Wood was given extended  (15 minutes) osteoporosis prevention counseling today. Shane Wood is at risk for osteopenia and osteoporosis due to his Vitamin D deficiency. He was encouraged to take his Vitamin D and follow his higher calcium diet and increase strengthening exercise to help strengthen his bones and decrease his risk of osteopenia and osteoporosis.  Dyslipidemia, Mixed We will monitor with Cardiology.  Obesity Shane Wood is currently in the action stage of change. As such, his goal is to continue with weight loss efforts. He has agreed to follow a lower carbohydrate, vegetable and lean protein rich diet plan. Shane Wood has been instructed to exercise as tolerated for weight loss and overall health benefits. We discussed the following Behavioral Modification Strategies today: increasing lean protein intake, decreasing simple carbohydrates, increasing vegetables, increase  H20 intake, and holiday eating strategies.  Shane Wood has a positive history of snoring but reports no apneic episodes and no hypersomnia.  Shane Wood has agreed to follow-up with our clinic in 2-3 weeks. He was informed of the importance of frequent follow-up visits to maximize his success  with intensive lifestyle modifications for his multiple health conditions.  ALLERGIES: No Known Allergies  MEDICATIONS: Current Outpatient Medications on File Prior to Visit  Medication Sig Dispense Refill  . Ascorbic Acid (VITAMIN C) 1000 MG tablet Take 1,000 mg by mouth daily.    [name] atorvastatin (LIPITOR) 80 MG tablet TAKE 1 TABLET (80 MG TOTAL) BY MOUTH DAILY AT 6 PM. 90 tablet 1  . clopidogrel (PLAVIX) 75 MG tablet Take 4 tablets (300 mg total) on Day 1 only, then take 1 tablet (75 mg total) once a day 90 tablet 3  . Cyanocobalamin (VITAMIN B 12 PO) Take 1 tablet by mouth daily.    [name] ezetimibe (ZETIA) 10 MG tablet Take 1 tablet (10 mg total) by mouth daily. 90 tablet 3  . metoprolol tartrate (LOPRESSOR) 25 MG tablet TAKE HALF A TABLET (12.5 MG TOTAL) BY MOUTH 2 (TWO) TIMES DAILY. 90 tablet 7  . Multiple Vitamin (MULTIVITAMIN) tablet Take 1 tablet by mouth daily.      . nitroGLYCERIN (NITROSTAT) 0.4 MG SL tablet Place 1 tablet (0.4 mg total) under the tongue every 5 (five) minutes x 3 doses as needed for chest pain. 25 tablet 3  . pantoprazole (PROTONIX) 40 MG tablet Take 1 tablet (40 mg total) by mouth daily. 90 tablet 3   No current facility-administered medications on file prior to visit.     PAST MEDICAL HISTORY: Past Medical History:  Diagnosis Date  . Allergy   . BACK PAIN   . ELEVATED BP READING WITHOUT DX HYPERTENSION 05/03/2008   Qualifier: Diagnosis of  By: Larose Kells MD, [place] GERD (gastroesophageal reflux disease)   . H/O cardiovascular stress test 2005    (-)  . Hyperlipidemia   . INSOMNIA-SLEEP DISORDER-UNSPEC   . PVC (premature ventricular contraction)     PAST SURGICAL HISTORY: Past Surgical History:  Procedure Laterality Date  . ARTERY REPAIR Right 08/23/2017   Procedure: EXPLORATION RIGHT RADIAL ARTERY,  RIGHT FOREARM FASCIOTOMY;  Surgeon: Elam Dutch, MD;  Location: [facility] OR;  Service: Vascular;  Laterality: Right;  . CHOLECYSTECTOMY    . CORONARY STENT  INTERVENTION N/A 08/23/2017   Procedure: CORONARY STENT INTERVENTION;  Surgeon: Jettie Booze, MD;  Location: [place] [facility];  Service: Cardiovascular;  Laterality: N/A;  . LEFT HEART CATH AND CORONARY ANGIOGRAPHY N/A 08/23/2017   Procedure: LEFT HEART CATH AND CORONARY ANGIOGRAPHY;  Surgeon: Jettie Booze, MD;  Location: [place] [facility];  Service: Cardiovascular;  Laterality: N/A;  . POLYPECTOMY    . TONSILLECTOMY     age 63 y/o    SOCIAL HISTORY: Social History   Tobacco Use  . Smoking status: Former Smoker    Quit date: 09/24/1988    Years since quitting: 30.4  . Smokeless tobacco: Former Systems developer    Types: Chew    Quit date: 1970  Substance Use Topics  . Alcohol use: Yes    Alcohol/week: 4.0 standard drinks    Types: 4 Glasses of wine per week    Comment: occasionally on weekends  . Drug use: No    FAMILY HISTORY: Family History  Problem Relation Age of Onset  . Lung cancer Father  smoker  . Diabetes Maternal Grandmother   . Diabetes Maternal Grandfather   . Hypertension Mother   . Depression Mother   . Obesity Mother   . Heart attack Other        uncle MI at age 65s  . Stroke Other        GM, in her 61  . Colon cancer Neg Hx   . Prostate cancer Neg Hx   . Colon polyps Neg Hx   . Esophageal cancer Neg Hx   . Rectal cancer Neg Hx   . Stomach cancer Neg Hx   . Pancreatic cancer Neg Hx    ROS: Review of Systems  Gastrointestinal: Negative for nausea.  Musculoskeletal:       Negative for muscle weakness.  Endo/Heme/Allergies:       Negative for hypoglycemia.   PHYSICAL EXAM: Blood pressure 119/72, pulse (!) 56, temperature 97.9 F (36.6 C), temperature source Oral, height 5\' 11"  (1.803 m), weight 207 lb (93.9 kg), SpO2 99 %. Body mass index is 28.87 kg/m. Physical Exam Vitals signs reviewed.  Constitutional:      Appearance: Normal appearance. He is obese.  Cardiovascular:     Rate and Rhythm: Normal rate.     Pulses: Normal pulses.    Pulmonary:     Effort: Pulmonary effort is normal.     Breath sounds: Normal breath sounds.  Musculoskeletal: Normal range of motion.  Skin:    General: Skin is warm and dry.  Neurological:     Mental Status: He is alert and oriented to person, place, and time.  Psychiatric:        Behavior: Behavior normal.   RECENT LABS AND TESTS: BMET    Component Value Date/Time   NA 139 09/18/2018 0801   K 4.6 09/18/2018 0801   CL 104 09/18/2018 0801   CO2 22 09/18/2018 0801   GLUCOSE 99 09/18/2018 0801   GLUCOSE 149 (H) 08/24/2017 0257   BUN 17 09/18/2018 0801   CREATININE 1.06 09/18/2018 0801   CALCIUM 9.2 09/18/2018 0801   GFRNONAA 74 09/18/2018 0801   GFRAA 86 09/18/2018 0801   Lab Results  Component Value Date   HGBA1C 5.8 12/22/2018   HGBA1C 5.6 09/18/2018   HGBA1C 5.7 (H) 03/04/2018   HGBA1C 5.5 11/26/2017   HGBA1C 5.7 (H) 08/08/2017   Lab Results  Component Value Date   INSULIN 15.3 09/18/2018   INSULIN 17.8 03/04/2018   INSULIN 8.7 11/26/2017   INSULIN 11.8 08/08/2017   CBC    Component Value Date/Time   WBC 7.1 12/22/2018 1422   RBC 4.68 12/22/2018 1422   HGB 13.8 12/22/2018 1422   HCT 40.9 12/22/2018 1422   PLT 213.0 12/22/2018 1422   MCV 87.4 12/22/2018 1422   MCH 28.0 08/24/2017 0257   MCHC 33.8 12/22/2018 1422   RDW 14.6 12/22/2018 1422   LYMPHSABS 2.1 12/22/2018 1422   MONOABS 0.5 12/22/2018 1422   EOSABS 0.2 12/22/2018 1422   BASOSABS 0.0 12/22/2018 1422   Iron/TIBC/Ferritin/ %Sat    Component Value Date/Time   IRON 103 12/22/2018 1422   FERRITIN 96.8 12/22/2018 1422   Lipid Panel     Component Value Date/Time   CHOL 124 02/04/2019 0755   TRIG 150 (H) 02/04/2019 0755   HDL 39 (L) 02/04/2019 0755   CHOLHDL 3.2 02/04/2019 0755   CHOLHDL 4 07/15/2017 0828   VLDL 41.0 (H) 07/15/2017 0828   LDLCALC 59 02/04/2019 0755   LDLDIRECT 105.0 07/15/2017 [id number]  Hepatic Function Panel     Component Value Date/Time   PROT 6.5 09/18/2018 0801    ALBUMIN 4.4 09/18/2018 0801   AST 22 09/18/2018 0801   ALT 34 09/18/2018 0801   ALKPHOS 51 09/18/2018 0801   BILITOT 0.4 09/18/2018 0801   BILIDIR 0.11 10/04/2017 0802      Component Value Date/Time   TSH 2.080 08/08/2017 1016   TSH 2.14 07/15/2017 0828   TSH 1.62 07/08/2015 1405   Results for Beckers, Timithy A "PAT" (MRN [id number]) as of 03/16/2019 08:50  Ref. Range 09/18/2018 08:01  Vitamin D, 25-Hydroxy Latest Ref Range: 30.0 - 100.0 ng/mL 34.5   OBESITY BEHAVIORAL INTERVENTION VISIT  Today's visit was #26  Starting weight: 216 lbs Starting date: 08/08/2017 Today's weight: 207 lbs  Today's date: 03/12/2019 Total lbs lost to date: 9    03/12/2019  Height 5\' 11"  (1.803 m)  Weight 207 lb (93.9 kg)  BMI (Calculated) 28.88  BLOOD PRESSURE - SYSTOLIC [id number]  BLOOD PRESSURE - DIASTOLIC 72   Body Fat % [id number] %  Total Body Water (lbs) 101 lbs   ASK: We discussed the diagnosis of obesity with [name] today and Qays agreed to give [name] permission to discuss obesity behavioral modification therapy today.  ASSESS: Rector has the diagnosis of obesity and his BMI today is 28.9. Dresean is in the action stage of change.   ADVISE: Amaziah was educated on the multiple health risks of obesity as well as the benefit of weight loss to improve his health. He was advised of the need for long term treatment and the importance of lifestyle modifications to improve his current health and to decrease his risk of future health problems.  AGREE: Multiple dietary modification options and treatment options were discussed and  Tashan agreed to follow the recommendations documented in the above note.  ARRANGE: Jayesh was educated on the importance of frequent visits to treat obesity as outlined per CMS and USPSTF guidelines and agreed to schedule his next follow up appointment today.  IMichaelene Song, am acting as Location manager for [employer]. Juleen China, DO  I have reviewed the above  documentation for accuracy and completeness, and I agree with the above. Briscoe Deutscher, DO

## 2019-04-02 ENCOUNTER — Encounter (INDEPENDENT_AMBULATORY_CARE_PROVIDER_SITE_OTHER): Payer: Self-pay | Admitting: Family Medicine

## 2019-04-02 ENCOUNTER — Other Ambulatory Visit: Payer: Self-pay

## 2019-04-02 ENCOUNTER — Ambulatory Visit (INDEPENDENT_AMBULATORY_CARE_PROVIDER_SITE_OTHER): Payer: 59 | Admitting: Family Medicine

## 2019-04-02 VITALS — BP 125/72 | HR 53 | Temp 98.5°F | Ht 71.0 in | Wt 205.0 lb

## 2019-04-02 DIAGNOSIS — R7303 Prediabetes: Secondary | ICD-10-CM | POA: Diagnosis not present

## 2019-04-02 DIAGNOSIS — Z9189 Other specified personal risk factors, not elsewhere classified: Secondary | ICD-10-CM | POA: Diagnosis not present

## 2019-04-02 DIAGNOSIS — E559 Vitamin D deficiency, unspecified: Secondary | ICD-10-CM | POA: Diagnosis not present

## 2019-04-02 DIAGNOSIS — E669 Obesity, unspecified: Secondary | ICD-10-CM

## 2019-04-02 DIAGNOSIS — E782 Mixed hyperlipidemia: Secondary | ICD-10-CM

## 2019-04-02 DIAGNOSIS — Z6830 Body mass index (BMI) 30.0-30.9, adult: Secondary | ICD-10-CM

## 2019-04-02 MED ORDER — VITAMIN D (ERGOCALCIFEROL) 1.25 MG (50000 UNIT) PO CAPS: 50000.0000 [IU] | ORAL_CAPSULE | ORAL | 0 refills | Status: DC

## 2019-04-02 MED ORDER — RYBELSUS 3 MG PO TABS: 3.0000 mg | ORAL_TABLET | Freq: Every day | ORAL | 0 refills | Status: DC

## 2019-04-06 NOTE — Progress Notes (Signed)
Office: [phone]  /  Fax: [phone]   HPI:  Chief Complaint: OBESITY Shane Wood is here to discuss his progress with his obesity treatment plan. He is on the lower carbohydrate, vegetable and lean protein rich diet plan and states he is following his eating plan approximately 75 % of the time. He states he is walking for 30-45 minutes 6 times per week.  Shane Wood follows the "slow carb diet" with 1 cheat day. He finds the rules easy to follow. He is still exercising with no issues.  Today's visit was # 27  Starting weight: 216 lbs Starting date: 08/08/17 Today's weight : 205 lbs  Today's date: 04/02/2019 Total lbs lost to date: 11 Total lbs lost since last in-office visit: 2  Pre-Diabetes Shane Wood has a diagnosis of pre-diabetes. His last A1c was 5.8 on 12/22/2018. We discussed Rybelsus benefits for weight management, insulin resistance, and lipid management, along with cardiac benefits. He would like to try it.  At risk for diabetes Shane Wood is at higher than average risk for developing diabetes due to his obesity and pre-diabetes.   Vitamin D Deficiency Shane Wood has a diagnosis of vitamin D deficiency. He is taking prescription Vit D.  Mixed Dyslipidemia Shane Wood has a diagnosis of mixed dyslipidemia. He is taking Lipitor 80 mg and Zetia 10 mg, and he denies myalgias. Reviewed his most recent labs with Dr. Irish Lack on 02/04/2019. His triglycerides was 150 and HDL was 39.  ASSESSMENT AND PLAN:  Prediabetes - Plan: Semaglutide (RYBELSUS) 3 MG TABS  Vitamin D deficiency - Plan: Vitamin D, Ergocalciferol, (DRISDOL) 1.25 MG (50000 UT) CAPS capsule  Mixed dyslipidemia  At risk for diabetes mellitus  Class 1 obesity with serious comorbidity and body mass index (BMI) of 30.0 to 30.9 in adult, unspecified obesity type - Starting BMI greater then 30  PLAN:  Pre-Diabetes Shane Wood will continue to work on weight loss, exercise, and decreasing simple carbohydrates to help decrease the  risk of diabetes. Shane Wood agrees to start Rybelsus 3 mg PO daily #30 with no refills. Shane Wood agrees to follow up with our clinic in 2 to 4 weeks.  Diabetes risk counseling (~15 min) Shane Wood is a 63 y.o. male and has risk factors for diabetes including obesity and pre-diabetes. We discussed intensive lifestyle modifications today with an emphasis on weight loss as well as increasing exercise and decreasing simple carbohydrates in his diet.  Vitamin D Deficiency Low vitamin D level contributes to fatigue and are associated with obesity, breast, and colon cancer. Shane Wood agrees to continue taking prescription Vit D 50,000 IU every week #4 and we will refill for 1 month. He will follow up for routine testing of vitamin D, at least 2-3 times per year to avoid over-replacement. Shane Wood agrees to follow up with our clinic in 2 to 4 weeks.  Mixed Dyslipidemia Intensive lifestyle modifications as the first line treatment for dyslipidemia. We discussed many lifestyle modifications today and Shane Wood will continue to work on diet, exercise and weight loss efforts. We will recheck labs in 2-3 months after Rybelsus initiation. We will continue to monitor.  Obesity Shane Wood is currently in the action stage of change. As such, his goal is to continue with weight loss efforts He has agreed to follow a lower carbohydrate, vegetable and lean protein rich diet plan Shane Wood has been instructed to work up to a goal of 150 minutes of combined cardio and strengthening exercise per week for weight loss and overall health benefits. We discussed the following Behavioral Modification Strategies  today: increasing lean protein intake, decreasing simple carbohydrates, and planning for success   Shane Wood has agreed to follow up with our clinic in 2 to 4 weeks. He was informed of the importance of frequent follow up visits to maximize his success with intensive lifestyle modifications for his multiple health  conditions.  ALLERGIES: No Known Allergies  MEDICATIONS: Current Outpatient Medications on File Prior to Visit  Medication Sig Dispense Refill  . Ascorbic Acid (VITAMIN C) 1000 MG tablet Take 1,000 mg by mouth daily.    [name] atorvastatin (LIPITOR) 80 MG tablet TAKE 1 TABLET (80 MG TOTAL) BY MOUTH DAILY AT 6 PM. 90 tablet 1  . clopidogrel (PLAVIX) 75 MG tablet Take 4 tablets (300 mg total) on Day 1 only, then take 1 tablet (75 mg total) once a day 90 tablet 3  . Cyanocobalamin (VITAMIN B 12 PO) Take 1 tablet by mouth daily.    [name] ezetimibe (ZETIA) 10 MG tablet Take 1 tablet (10 mg total) by mouth daily. 90 tablet 3  . metoprolol tartrate (LOPRESSOR) 25 MG tablet TAKE HALF A TABLET (12.5 MG TOTAL) BY MOUTH 2 (TWO) TIMES DAILY. 90 tablet 7  . Multiple Vitamin (MULTIVITAMIN) tablet Take 1 tablet by mouth daily.      . nitroGLYCERIN (NITROSTAT) 0.4 MG SL tablet Place 1 tablet (0.4 mg total) under the tongue every 5 (five) minutes x 3 doses as needed for chest pain. 25 tablet 3  . pantoprazole (PROTONIX) 40 MG tablet Take 1 tablet (40 mg total) by mouth daily. 90 tablet 3   No current facility-administered medications on file prior to visit.   PAST MEDICAL HISTORY: Past Medical History:  Diagnosis Date  . Allergy   . BACK PAIN   . ELEVATED BP READING WITHOUT DX HYPERTENSION 05/03/2008   Qualifier: Diagnosis of  By: Larose Kells MD, [place] GERD (gastroesophageal reflux disease)   . H/O cardiovascular stress test 2005    (-)  . Hyperlipidemia   . INSOMNIA-SLEEP DISORDER-UNSPEC   . PVC (premature ventricular contraction)     PAST SURGICAL HISTORY: Past Surgical History:  Procedure Laterality Date  . ARTERY REPAIR Right 08/23/2017   Procedure: EXPLORATION RIGHT RADIAL ARTERY,  RIGHT FOREARM FASCIOTOMY;  Surgeon: Elam Dutch, MD;  Location: [facility] OR;  Service: Vascular;  Laterality: Right;  . CHOLECYSTECTOMY    . CORONARY STENT INTERVENTION N/A 08/23/2017   Procedure: CORONARY STENT  INTERVENTION;  Surgeon: Jettie Booze, MD;  Location: [facility];  Service: Cardiovascular;  Laterality: N/A;  . LEFT HEART CATH AND CORONARY ANGIOGRAPHY N/A 08/23/2017   Procedure: LEFT HEART CATH AND CORONARY ANGIOGRAPHY;  Surgeon: Jettie Booze, MD;  Location: [facility];  Service: Cardiovascular;  Laterality: N/A;  . POLYPECTOMY    . TONSILLECTOMY     age 63 y/o   SOCIAL HISTORY: Social History   Tobacco Use  . Smoking status: Former Smoker    Quit date: 09/24/1988    Years since quitting: 30.5  . Smokeless tobacco: Former Systems developer    Types: Chew    Quit date: 1970  Substance Use Topics  . Alcohol use: Yes    Alcohol/week: 4.0 standard drinks    Types: 4 Glasses of wine per week    Comment: occasionally on weekends  . Drug use: No   FAMILY HISTORY: Family History  Problem Relation Age of Onset  . Lung cancer Father        smoker  . Diabetes  Maternal Grandmother   . Diabetes Maternal Grandfather   . Hypertension Mother   . Depression Mother   . Obesity Mother   . Heart attack Other        uncle MI at age 26s  . Stroke Other        GM, in her 15  . Colon cancer Neg Hx   . Prostate cancer Neg Hx   . Colon polyps Neg Hx   . Esophageal cancer Neg Hx   . Rectal cancer Neg Hx   . Stomach cancer Neg Hx   . Pancreatic cancer Neg Hx    ROS: Review of Systems  Constitutional: Positive for weight loss.  Musculoskeletal: Negative for myalgias.   PHYSICAL EXAM: Blood pressure 125/72, pulse (!) 53, temperature 98.5 F (36.9 C), temperature source Oral, height 5\' 11"  (1.803 m), weight 205 lb (93 kg), SpO2 99 %. Body mass index is 28.59 kg/m. Physical Exam Vitals reviewed.  Constitutional:      Appearance: Normal appearance. He is obese.  Cardiovascular:     Rate and Rhythm: Normal rate.     Pulses: Normal pulses.  Pulmonary:     Effort: Pulmonary effort is normal.     Breath sounds: Normal breath sounds.  Musculoskeletal:        General:  Normal range of motion.  Skin:    General: Skin is warm and dry.  Neurological:     Mental Status: He is alert and oriented to person, place, and time.  Psychiatric:        Mood and Affect: Mood normal.        Behavior: Behavior normal.     RECENT LABS AND TESTS: BMET    Component Value Date/Time   NA 139 09/18/2018 0801   K 4.6 09/18/2018 0801   CL 104 09/18/2018 0801   CO2 22 09/18/2018 0801   GLUCOSE 99 09/18/2018 0801   GLUCOSE 149 (H) 08/24/2017 0257   BUN 17 09/18/2018 0801   CREATININE 1.06 09/18/2018 0801   CALCIUM 9.2 09/18/2018 0801   GFRNONAA 74 09/18/2018 0801   GFRAA 86 09/18/2018 0801   Lab Results  Component Value Date   HGBA1C 5.8 12/22/2018   HGBA1C 5.6 09/18/2018   HGBA1C 5.7 (H) 03/04/2018   HGBA1C 5.5 11/26/2017   HGBA1C 5.7 (H) 08/08/2017   Lab Results  Component Value Date   INSULIN 15.3 09/18/2018   INSULIN 17.8 03/04/2018   INSULIN 8.7 11/26/2017   INSULIN 11.8 08/08/2017   CBC    Component Value Date/Time   WBC 7.1 12/22/2018 1422   RBC 4.68 12/22/2018 1422   HGB 13.8 12/22/2018 1422   HCT 40.9 12/22/2018 1422   PLT 213.0 12/22/2018 1422   MCV 87.4 12/22/2018 1422   MCH 28.0 08/24/2017 0257   MCHC 33.8 12/22/2018 1422   RDW 14.6 12/22/2018 1422   LYMPHSABS 2.1 12/22/2018 1422   MONOABS 0.5 12/22/2018 1422   EOSABS 0.2 12/22/2018 1422   BASOSABS 0.0 12/22/2018 1422   Iron/TIBC/Ferritin/ %Sat    Component Value Date/Time   IRON 103 12/22/2018 1422   FERRITIN 96.8 12/22/2018 1422   Lipid Panel     Component Value Date/Time   CHOL 124 02/04/2019 0755   TRIG 150 (H) 02/04/2019 0755   HDL 39 (L) 02/04/2019 0755   CHOLHDL 3.2 02/04/2019 0755   CHOLHDL 4 07/15/2017 0828   VLDL 41.0 (H) 07/15/2017 0828   LDLCALC 59 02/04/2019 0755   LDLDIRECT 105.0 07/15/2017 [id number]  Hepatic Function Panel     Component Value Date/Time   PROT 6.5 09/18/2018 0801   ALBUMIN 4.4 09/18/2018 0801   AST 22 09/18/2018 0801   ALT 34 09/18/2018  0801   ALKPHOS 51 09/18/2018 0801   BILITOT 0.4 09/18/2018 0801   BILIDIR 0.11 10/04/2017 0802      Component Value Date/Time   TSH 2.080 08/08/2017 1016   TSH 2.14 07/15/2017 0828   TSH 1.62 07/08/2015 1405   I, Trixie Dredge, am acting as transcriptionist for Briscoe Deutscher, DO  I have reviewed the above documentation for accuracy and completeness, and I agree with the above. Briscoe Deutscher, DO

## 2019-04-07 ENCOUNTER — Encounter (INDEPENDENT_AMBULATORY_CARE_PROVIDER_SITE_OTHER): Payer: Self-pay | Admitting: Family Medicine

## 2019-04-29 ENCOUNTER — Ambulatory Visit (INDEPENDENT_AMBULATORY_CARE_PROVIDER_SITE_OTHER): Payer: 59 | Admitting: Family Medicine

## 2019-04-29 ENCOUNTER — Encounter (INDEPENDENT_AMBULATORY_CARE_PROVIDER_SITE_OTHER): Payer: Self-pay | Admitting: Family Medicine

## 2019-04-29 ENCOUNTER — Other Ambulatory Visit: Payer: Self-pay

## 2019-04-29 VITALS — BP 127/76 | HR 58 | Temp 98.1°F | Ht 71.0 in | Wt 211.0 lb

## 2019-04-29 DIAGNOSIS — R7303 Prediabetes: Secondary | ICD-10-CM | POA: Diagnosis not present

## 2019-04-29 DIAGNOSIS — Z9189 Other specified personal risk factors, not elsewhere classified: Secondary | ICD-10-CM

## 2019-04-29 DIAGNOSIS — E782 Mixed hyperlipidemia: Secondary | ICD-10-CM

## 2019-04-29 DIAGNOSIS — Z6830 Body mass index (BMI) 30.0-30.9, adult: Secondary | ICD-10-CM

## 2019-04-29 DIAGNOSIS — E559 Vitamin D deficiency, unspecified: Secondary | ICD-10-CM

## 2019-04-29 DIAGNOSIS — E669 Obesity, unspecified: Secondary | ICD-10-CM

## 2019-04-29 MED ORDER — RYBELSUS 3 MG PO TABS: 3.0000 mg | ORAL_TABLET | Freq: Every day | ORAL | 0 refills | Status: DC

## 2019-04-30 NOTE — Progress Notes (Signed)
Chief Complaint: OBESITY Shane Wood is here to discuss his progress with his obesity treatment plan along with follow-up of his obesity related diagnoses. Shane Wood is on the lower carbohydrate, vegetable and lean protein rich diet plan and states he is following his eating plan approximately 15-20% of the time. Shane Wood states he is walking for 35-40 minutes 7 times per week.  Today's visit was #: 28 Starting weight: 216 lbs Starting date: 08/08/2017 Today's weight: 211 lbs Today's date: 04/30/2019 Total lbs lost to date: 5 lbs Total lbs lost since last in-office visit: 0  Interim History: Shane Wood was off his diet during the holidays.  He is back to the plan now, and per patient, is already down a few pounds.  Saturday is his cheat day.  Subjective:   1. Prediabetes Last A1c was 5.8 12/22/2018.  2. Vitamin D deficiency Shane Wood has a diagnosis of vitamin D deficiency. He is currently taking vit D. He denies nausea, vomiting or muscle weakness.  3. Mixed hyperlipidemia Shane Wood has hyperlipidemia and has been trying to improve his cholesterol levels with intensive lifestyle modification including a low saturated fat diet, exercise and weight loss. He denies any chest pain, claudication or myalgias.  4. At risk for heart disease Shane Wood is at a higher than average risk for cardiovascular disease due to obesity. Reviewed: taking medications as instructed.  Assessment/Plan:   1. Prediabetes Shane Wood will continue to work on weight loss, exercise, and decreasing simple carbohydrates to help decrease the risk of diabetes.   - Semaglutide (RYBELSUS) 3 MG TABS; Take 3 mg by mouth daily.  Dispense: 30 tablet; Refill: 0  2. Vitamin D deficiency Low Vitamin D level contributes to fatigue and are associated with obesity, breast, and colon cancer. He agrees to continue to take prescription Vitamin D @50 ,000 IU every week and will follow-up for routine testing of vitamin D, at least 2-3 times per year to avoid  over-replacement.  3. Mixed hyperlipidemia This problem is chronic. Cardiovascular risk and specific lipid/LDL goals reviewed.  We discussed several lifestyle modifications today and Shane Wood will continue to work on diet, exercise and weight loss efforts. Orders and follow up as documented in patient record.   4. At risk for heart disease Shane Wood was given (~15 minutes) coronary artery disease prevention counseling today. He is 64 y.o. male and has risk factors for heart disease including obesity. We discussed intensive lifestyle modifications today with an emphasis on specific weight loss instructions and strategies.   5. Class 1 obesity with serious comorbidity and body mass index (BMI) of 30.0 to 30.9 in adult, unspecified obesity type Shane Wood is currently in the action stage of change. As such, his goal is to continue with weight loss efforts. He has agreed to lower carbohydrate, vegetable and lean protein rich diet plan.   We discussed the following exercise goals today: For substantial health benefits, adults should do at least 150 minutes (2 hours and 30 minutes) a week of moderate-intensity, or 75 minutes (1 hour and 15 minutes) a week of vigorous-intensity aerobic physical activity, or an equivalent combination of moderate- and vigorous-intensity aerobic activity. Aerobic activity should be performed in episodes of at least 10 minutes, and preferably, it should be spread throughout the week. Adults should also include muscle-strengthening activities that involve all major muscle groups on 2 or more days a week.  We discussed the following behavioral modification strategies today: increasing water intake and planning for success.  Shane A Guillermo has agreed  to follow-up with our clinic in 2 weeks. He was informed of the importance of frequent follow-up visits to maximize his success with intensive lifestyle modifications for his multiple health conditions.  Objective:   Blood pressure 127/76, pulse (!)  58, temperature 98.1 F (36.7 C), temperature source Oral, height 5\' 11"  (1.803 m), weight 211 lb (95.7 kg), SpO2 99 %. Body mass index is 29.43 kg/m.  General: Cooperative, alert, well developed, in no acute distress. HEENT: Conjunctivae and lids unremarkable. Neck: No thyromegaly.  Cardiovascular: Regular rhythm.  Lungs: Normal work of breathing. Extremities: No edema.  Neurologic: No focal deficits.   Lab Results  Component Value Date   CREATININE 1.06 09/18/2018   BUN 17 09/18/2018   NA 139 09/18/2018   K 4.6 09/18/2018   CL 104 09/18/2018   CO2 22 09/18/2018   Lab Results  Component Value Date   ALT 34 09/18/2018   AST 22 09/18/2018   ALKPHOS 51 09/18/2018   BILITOT 0.4 09/18/2018   Lab Results  Component Value Date   HGBA1C 5.8 12/22/2018   HGBA1C 5.6 09/18/2018   HGBA1C 5.7 (H) 03/04/2018   HGBA1C 5.5 11/26/2017   HGBA1C 5.7 (H) 08/08/2017   Lab Results  Component Value Date   INSULIN 15.3 09/18/2018   INSULIN 17.8 03/04/2018   INSULIN 8.7 11/26/2017   INSULIN 11.8 08/08/2017   Lab Results  Component Value Date   TSH 2.080 08/08/2017   Lab Results  Component Value Date   CHOL 124 02/04/2019   HDL 39 (L) 02/04/2019   LDLCALC 59 02/04/2019   LDLDIRECT 105.0 07/15/2017   TRIG 150 (H) 02/04/2019   CHOLHDL 3.2 02/04/2019   Lab Results  Component Value Date   WBC 7.1 12/22/2018   HGB 13.8 12/22/2018   HCT 40.9 12/22/2018   MCV 87.4 12/22/2018   PLT 213.0 12/22/2018   Lab Results  Component Value Date   IRON 103 12/22/2018   FERRITIN 96.8 12/22/2018   Attestation Statements:   Reviewed by clinician on day of visit: allergies, medications, problem list, medical history, surgical history, family history, social history and previous encounter notes.  This visit occurred during the SARS-CoV-2 public health emergency. Safety protocols were in place, including screening questions prior to the visit, additional usage of staff PPE, and extensive  cleaning of exam room while observing appropriate contact time as indicated for disinfecting solutions. (CPT [id number])  I, Water quality scientist, CMA, am acting as transcriptionist for [employer], DO.  I have reviewed the above documentation for accuracy and completeness, and I agree with the above. Briscoe Deutscher, DO

## 2019-05-02 ENCOUNTER — Encounter (INDEPENDENT_AMBULATORY_CARE_PROVIDER_SITE_OTHER): Payer: Self-pay | Admitting: Family Medicine

## 2019-05-06 ENCOUNTER — Encounter (INDEPENDENT_AMBULATORY_CARE_PROVIDER_SITE_OTHER): Payer: Self-pay | Admitting: Family Medicine

## 2019-05-07 ENCOUNTER — Encounter (INDEPENDENT_AMBULATORY_CARE_PROVIDER_SITE_OTHER): Payer: Self-pay

## 2019-05-14 ENCOUNTER — Encounter (INDEPENDENT_AMBULATORY_CARE_PROVIDER_SITE_OTHER): Payer: Self-pay | Admitting: Physician Assistant

## 2019-05-14 ENCOUNTER — Ambulatory Visit (INDEPENDENT_AMBULATORY_CARE_PROVIDER_SITE_OTHER): Payer: 59 | Admitting: Physician Assistant

## 2019-05-14 ENCOUNTER — Other Ambulatory Visit: Payer: Self-pay

## 2019-05-14 VITALS — BP 123/71 | HR 56 | Temp 98.1°F | Ht 71.0 in | Wt 209.0 lb

## 2019-05-14 DIAGNOSIS — Z6830 Body mass index (BMI) 30.0-30.9, adult: Secondary | ICD-10-CM

## 2019-05-14 DIAGNOSIS — Z9189 Other specified personal risk factors, not elsewhere classified: Secondary | ICD-10-CM | POA: Diagnosis not present

## 2019-05-14 DIAGNOSIS — R7303 Prediabetes: Secondary | ICD-10-CM

## 2019-05-14 DIAGNOSIS — E559 Vitamin D deficiency, unspecified: Secondary | ICD-10-CM

## 2019-05-14 DIAGNOSIS — E669 Obesity, unspecified: Secondary | ICD-10-CM

## 2019-05-14 DIAGNOSIS — E7849 Other hyperlipidemia: Secondary | ICD-10-CM | POA: Diagnosis not present

## 2019-05-14 MED ORDER — VITAMIN D (ERGOCALCIFEROL) 1.25 MG (50000 UNIT) PO CAPS: 50000.0000 [IU] | ORAL_CAPSULE | ORAL | 0 refills | Status: DC

## 2019-05-14 MED ORDER — BD PEN NEEDLE NANO 2ND GEN 32G X 4 MM MISC: 1.0000 | Freq: Two times a day (BID) | 0 refills | Status: DC

## 2019-05-14 MED ORDER — VICTOZA 18 MG/3ML ~~LOC~~ SOPN: 0.6000 mg | PEN_INJECTOR | SUBCUTANEOUS | 0 refills | Status: DC

## 2019-05-14 NOTE — Progress Notes (Signed)
Chief Complaint:   OBESITY Shane Wood is here to discuss his progress with his obesity treatment plan along with follow-up of his obesity related diagnoses. Shane Wood is following a lower carbohydrate, vegetable and lean protein rich diet plan and states he is following his eating plan approximately 75% of the time. Shane Wood states he is walking 30-45 minutes 6 times per week.  Today's visit was #: 17 Starting weight: 216 lbs Starting date: 08/08/2017 Today's weight: 209 lbs Today's date: 05/14/2019 Total lbs lost to date: 7 Total lbs lost since last in-office visit: 2  Interim History: Shane Wood reports that he is enjoying the low carb plan and wants to continue for now. He states he can be a nervous or boredom eater. He just ordered weights so that he can add resistance training to his workout regimen.  Subjective:   Prediabetes. Shane Wood has a diagnosis of prediabetes based on his elevated HgA1c and was informed this puts him at greater risk of developing diabetes. He continues to work on diet and exercise to decrease his risk of diabetes. He denies nausea or hypoglycemia. Shane Wood is on no medications. Shane Wood has not been approved.  Lab Results  Component Value Date   HGBA1C 5.8 12/22/2018   Lab Results  Component Value Date   INSULIN 15.3 09/18/2018   INSULIN 17.8 03/04/2018   INSULIN 8.7 11/26/2017   INSULIN 11.8 08/08/2017   Vitamin D deficiency. Shane Wood is on weekly Vitamin D. No nausea, vomiting, or muscle weakness. He is due for labs.  Other hyperlipidemia. Shane Wood is on atorvastatin and Zetia. He has a history of an MI. No myalgias. He checks in with his cardiologist every 6 months and is exercising regularly.   Lab Results  Component Value Date   CHOL 124 02/04/2019   HDL 39 (L) 02/04/2019   LDLCALC 59 02/04/2019   LDLDIRECT 105.0 07/15/2017   TRIG 150 (H) 02/04/2019   CHOLHDL 3.2 02/04/2019   Lab Results  Component Value Date   ALT 34 09/18/2018   AST 22  09/18/2018   ALKPHOS 51 09/18/2018   BILITOT 0.4 09/18/2018   The ASCVD Risk score Shane Bussing DC Jr., et al., 2013) failed to calculate for the following reasons:   The patient has a prior MI or stroke diagnosis  At risk for diabetes mellitus. Shane Wood is at higher than average risk for developing diabetes due to his obesity.   Assessment/Plan:   Prediabetes. Shane Wood will continue to work on weight loss, exercise, and decreasing simple carbohydrates to help decrease the risk of diabetes. He will start liraglutide (VICTOZA) 18 MG/3ML SOPN, Insulin Pen Needle (BD PEN NEEDLE NANO 2ND GEN) 32G X 4 MM MISC SQ daily, #1 pen and #100 needles.  Vitamin D deficiency. Low Vitamin D level contributes to fatigue and are associated with obesity, breast, and colon cancer. He was given a refill on his prescription Vitamin D, Ergocalciferol, (DRISDOL) 1.25 MG (50000 UNIT) CAPS capsule every week #4 with 0 refills and will follow-up for routine testing of Vitamin D, at least 2-3 times per year to avoid over-replacement.   Other hyperlipidemia. Cardiovascular risk and specific lipid/LDL goals reviewed.  We discussed several lifestyle modifications today and Shane Wood will continue his medications, work on diet, exercise and weight loss efforts. Orders and follow up as documented in patient record. He will have labs checked.  Counseling Intensive lifestyle modifications are the first line treatment for this issue. . Dietary changes: Increase soluble fiber. Decrease simple carbohydrates. [name]  Exercise changes: Moderate to vigorous-intensity aerobic activity 150 minutes per week if tolerated. Lipid-lowering medications: see documented in medical record.  At risk for diabetes mellitus. Shane Wood was given approximately 15 minutes of diabetes education and counseling today. We discussed intensive lifestyle modifications today with an emphasis on weight loss as well as increasing exercise and decreasing simple carbohydrates in  his diet. We also reviewed medication options with an emphasis on risk versus benefit of those discussed.   Repetitive spaced learning was employed today to elicit superior memory formation and behavioral change.  Class 1 obesity with serious comorbidity and body mass index (BMI) of 30.0 to 30.9 in adult, unspecified obesity type.  Shane Wood is currently in the action stage of change. As such, his goal is to continue with weight loss efforts. He has agreed to following a lower carbohydrate, vegetable and lean protein rich diet plan.   Exercise goals: Older adults should follow the adult guidelines. When older adults cannot meet the adult guidelines, they should be as physically active as their abilities and conditions will allow.  Older adults should do exercises that maintain or improve balance if they are at risk of falling.   Behavioral modification strategies: increasing water intake and planning for success.  Shane Wood has agreed to follow-up with our clinic in 2-3 weeks. He was informed of the importance of frequent follow-up visits to maximize his success with intensive lifestyle modifications for his multiple health conditions.   Objective:   Blood pressure 123/71, pulse (!) 56, temperature 98.1 F (36.7 C), temperature source Oral, height 5\' 11"  (1.803 m), weight 209 lb (94.8 kg), SpO2 99 %. Body mass index is 29.15 kg/m.  General: Cooperative, alert, well developed, in no acute distress. HEENT: Conjunctivae and lids unremarkable. Cardiovascular: Regular rhythm.  Lungs: Normal work of breathing. Neurologic: No focal deficits.   Lab Results  Component Value Date   CREATININE 1.06 09/18/2018   BUN 17 09/18/2018   NA 139 09/18/2018   K 4.6 09/18/2018   CL 104 09/18/2018   CO2 22 09/18/2018   Lab Results  Component Value Date   ALT 34 09/18/2018   AST 22 09/18/2018   ALKPHOS 51 09/18/2018   BILITOT 0.4 09/18/2018   Lab Results  Component Value Date   HGBA1C 5.8  12/22/2018   HGBA1C 5.6 09/18/2018   HGBA1C 5.7 (H) 03/04/2018   HGBA1C 5.5 11/26/2017   HGBA1C 5.7 (H) 08/08/2017   Lab Results  Component Value Date   INSULIN 15.3 09/18/2018   INSULIN 17.8 03/04/2018   INSULIN 8.7 11/26/2017   INSULIN 11.8 08/08/2017   Lab Results  Component Value Date   TSH 2.080 08/08/2017   Lab Results  Component Value Date   CHOL 124 02/04/2019   HDL 39 (L) 02/04/2019   LDLCALC 59 02/04/2019   LDLDIRECT 105.0 07/15/2017   TRIG 150 (H) 02/04/2019   CHOLHDL 3.2 02/04/2019   Lab Results  Component Value Date   WBC 7.1 12/22/2018   HGB 13.8 12/22/2018   HCT 40.9 12/22/2018   MCV 87.4 12/22/2018   PLT 213.0 12/22/2018   Lab Results  Component Value Date   IRON 103 12/22/2018   FERRITIN 96.8 12/22/2018   Attestation Statements:   Reviewed by clinician on day of visit: allergies, medications, problem list, medical history, surgical history, family history, social history, and previous encounter notes.  IMichaelene Song, am acting as Location manager for Abby Potash, PA-C   I have reviewed the above documentation for accuracy and  completeness, and I agree with the above. Abby Potash, PA-C

## 2019-05-14 NOTE — Addendum Note (Signed)
Addended by: Wilfrid Lund on: 05/14/2019 04:38 PM   Modules accepted: Orders

## 2019-05-15 LAB — COMPREHENSIVE METABOLIC PANEL
ALT: 30 IU/L (ref 0–44)
AST: 25 IU/L (ref 0–40)
Albumin/Globulin Ratio: 2.5 — ABNORMAL HIGH (ref 1.2–2.2)
Albumin: 4.8 g/dL (ref 3.8–4.8)
Alkaline Phosphatase: 54 IU/L (ref 39–117)
BUN/Creatinine Ratio: 28 — ABNORMAL HIGH (ref 10–24)
BUN: 31 mg/dL — ABNORMAL HIGH (ref 8–27)
Bilirubin Total: 0.4 mg/dL (ref 0.0–1.2)
CO2: 19 mmol/L — ABNORMAL LOW (ref 20–29)
Calcium: 9.1 mg/dL (ref 8.6–10.2)
Chloride: 105 mmol/L (ref 96–106)
Creatinine, Ser: 1.09 mg/dL (ref 0.76–1.27)
GFR calc Af Amer: 83 mL/min/{1.73_m2} (ref >59)
GFR calc non Af Amer: 72 mL/min/{1.73_m2} (ref >59)
Globulin, Total: 1.9 g/dL (ref 1.5–4.5)
Glucose: 92 mg/dL (ref 65–99)
Potassium: 4.7 mmol/L (ref 3.5–5.2)
Sodium: 143 mmol/L (ref 134–144)
Total Protein: 6.7 g/dL (ref 6.0–8.5)

## 2019-05-15 LAB — LIPID PANEL WITH LDL/HDL RATIO
Cholesterol, Total: 150 mg/dL (ref 100–199)
HDL: 46 mg/dL (ref >39)
LDL Chol Calc (NIH): 79 mg/dL (ref 0–99)
LDL/HDL Ratio: 1.7 ratio (ref 0.0–3.6)
Triglycerides: 141 mg/dL (ref 0–149)
VLDL Cholesterol Cal: 25 mg/dL (ref 5–40)

## 2019-05-15 LAB — VITAMIN D 25 HYDROXY (VIT D DEFICIENCY, FRACTURES): Vit D, 25-Hydroxy: 37.8 ng/mL (ref 30.0–100.0)

## 2019-05-15 LAB — HEMOGLOBIN A1C
Est. average glucose Bld gHb Est-mCnc: 108 mg/dL
Hgb A1c MFr Bld: 5.4 % (ref 4.8–5.6)

## 2019-05-15 LAB — INSULIN, RANDOM: INSULIN: 12.7 u[IU]/mL (ref 2.6–24.9)

## 2019-05-25 NOTE — Telephone Encounter (Signed)
See Mychart Message/Response from today 05/25/19

## 2019-05-27 NOTE — Progress Notes (Signed)
Cardiology Office Note   Date:  05/29/2019   ID:  Shane Wood, DOB 12/06/1955, MRN [id number]  PCP:  Colon Branch, MD    No chief complaint on file.  Coronary artery disease  Wt Readings from Last 3 Encounters:  05/29/19 218 lb 12.8 oz (99.2 kg)  05/14/19 209 lb (94.8 kg)  04/29/19 211 lb (95.7 kg)       History of Present Illness: Shane Wood is a 64 y.o. male   with a history of normal ETT 01/2014 and echo showing LVEF 55 to 60% with mildly dilated a sending aorta and aortic sclerosis in 2015. He was admitted to [facility] with NSTEMI 08/23/2017 and underwent cardiac catheterization with DES to the proximal to mid RCA and ostial PDA, residual 50% proximal LAD, 80% circumflex, total OM1 normal LVEF 50 to 55% with mild aortic stenosis.Patient developed compartment syndrome of the right forearm post procedure and underwent exploration of the right forearm. Radial artery was identified and intact median nerve and ulnar neurovascular bundle intact. Wound was irrigated and loosely closed.Follow-up with vascular surgery of as well as Dr. Durward Fortes that arm had healed well.Also has history of PVCs and HLD.  He thinks he had a lot of stress in the months before the MI.MI sx were pressure and a deep ache.  Immediately after discharge, he had some chest twinges.  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  He has continued to do this during the Pandemic.  He also was going to the weight management center to help with weight loss.  Since the last visit, he walks regularly.  Back pain has limited his exercise.  Denies : Chest pain. Dizziness. Leg edema. Nitroglycerin use. Orthopnea. Palpitations. Paroxysmal nocturnal dyspnea. Shortness of breath. Syncope.   He does some work from home.   Home scale shows weight loss.  He is using a low carb diet.      Past Medical History:  Diagnosis Date  . Allergy   . BACK PAIN   . ELEVATED BP READING WITHOUT DX HYPERTENSION 05/03/2008    Qualifier: Diagnosis of  By: Larose Kells MD, [place] GERD (gastroesophageal reflux disease)   . H/O cardiovascular stress test 2005    (-)  . Hyperlipidemia   . INSOMNIA-SLEEP DISORDER-UNSPEC   . PVC (premature ventricular contraction)     Past Surgical History:  Procedure Laterality Date  . ARTERY REPAIR Right 08/23/2017   Procedure: EXPLORATION RIGHT RADIAL ARTERY,  RIGHT FOREARM FASCIOTOMY;  Surgeon: Elam Dutch, MD;  Location: [facility] OR;  Service: Vascular;  Laterality: Right;  . CHOLECYSTECTOMY    . CORONARY STENT INTERVENTION N/A 08/23/2017   Procedure: CORONARY STENT INTERVENTION;  Surgeon: Jettie Booze, MD;  Location: [place] [facility];  Service: Cardiovascular;  Laterality: N/A;  . LEFT HEART CATH AND CORONARY ANGIOGRAPHY N/A 08/23/2017   Procedure: LEFT HEART CATH AND CORONARY ANGIOGRAPHY;  Surgeon: Jettie Booze, MD;  Location: [facility];  Service: Cardiovascular;  Laterality: N/A;  . POLYPECTOMY    . TONSILLECTOMY     age 81 y/o     Current Outpatient Medications  Medication Sig Dispense Refill  . Ascorbic Acid (VITAMIN C) 1000 MG tablet Take 1,000 mg by mouth daily.    [name] atorvastatin (LIPITOR) 80 MG tablet TAKE 1 TABLET (80 MG TOTAL) BY MOUTH DAILY AT 6 PM. 90 tablet 1  . clopidogrel (PLAVIX) 75 MG tablet Take 4 tablets (300 mg total) on Day  1 only, then take 1 tablet (75 mg total) once a day 90 tablet 3  . Cyanocobalamin (VITAMIN B 12 PO) Take 1 tablet by mouth daily.    [name] ezetimibe (ZETIA) 10 MG tablet Take 1 tablet (10 mg total) by mouth daily. 90 tablet 3  . metoprolol tartrate (LOPRESSOR) 25 MG tablet TAKE HALF A TABLET (12.5 MG TOTAL) BY MOUTH 2 (TWO) TIMES DAILY. 90 tablet 7  . Multiple Vitamin (MULTIVITAMIN) tablet Take 1 tablet by mouth daily.      . nitroGLYCERIN (NITROSTAT) 0.4 MG SL tablet Place 1 tablet (0.4 mg total) under the tongue every 5 (five) minutes x 3 doses as needed for chest pain. 25 tablet 3  . pantoprazole (PROTONIX) 40 MG  tablet Take 1 tablet (40 mg total) by mouth daily. 90 tablet 3  . Vitamin D, Ergocalciferol, (DRISDOL) 1.25 MG (50000 UNIT) CAPS capsule Take 1 capsule (50,000 Units total) by mouth every 7 (seven) days. 4 capsule 0  . Insulin Pen Needle (BD PEN NEEDLE NANO 2ND GEN) 32G X 4 MM MISC 1 Package by Does not apply route 2 (two) times daily. 100 each 0  . liraglutide (VICTOZA) 18 MG/3ML SOPN Inject 0.1 mLs (0.6 mg total) into the skin every morning. 1 pen 0  . Semaglutide (RYBELSUS) 3 MG TABS Take 3 mg by mouth daily. 30 tablet 0   No current facility-administered medications for this visit.    Allergies:   Patient has no known allergies.    Social History:  The patient  reports that he quit smoking about 30 years ago. He quit smokeless tobacco use about 51 years ago.  His smokeless tobacco use included chew. He reports current alcohol use of about 4.0 standard drinks of alcohol per week. He reports that he does not use drugs.   Family History:  The patient's family history includes Depression in his mother; Diabetes in his maternal grandfather and maternal grandmother; Heart attack in an other family member; Hypertension in his mother; Lung cancer in his father; Obesity in his mother; Stroke in an other family member.    ROS:  Please see the history of present illness.   Otherwise, review of systems are positive for intentional weight loss.   All other systems are reviewed and negative.    PHYSICAL EXAM: VS:  BP 130/72   Pulse 61   Ht 5\' 11"  (1.803 m)   Wt 218 lb 12.8 oz (99.2 kg)   SpO2 99%   BMI 30.52 kg/m  , BMI Body mass index is 30.52 kg/m. GEN: Well nourished, well developed, in no acute distress  HEENT: normal  Neck: no JVD, carotid bruits, or masses Cardiac: RRR; no murmurs, rubs, or gallops,no edema  Respiratory:  clear to auscultation bilaterally, normal work of breathing GI: soft, nontender, nondistended, + BS MS: no deformity or atrophy  Skin: warm and dry, no rash Neuro:   Strength and sensation are intact Psych: euthymic mood, full affect   EKG:   The ekg ordered today demonstrates NSR, nonspecific ST changes   Recent Labs: 12/22/2018: Hemoglobin 13.8; Platelets 213.0 05/14/2019: ALT 30; BUN 31; Creatinine, Ser 1.09; Potassium 4.7; Sodium 143   Lipid Panel    Component Value Date/Time   CHOL 150 05/14/2019 1721   TRIG 141 05/14/2019 1721   HDL 46 05/14/2019 1721   CHOLHDL 3.2 02/04/2019 0755   CHOLHDL 4 07/15/2017 0828   VLDL 41.0 (H) 07/15/2017 0828   LDLCALC 79 05/14/2019 1721   LDLDIRECT  105.0 07/15/2017 0828     Other studies Reviewed: Additional studies/ records that were reviewed today with results demonstrating: labs done with weight loss center reviewed: TC 150, HDL 46, A1C 5.4, Cr 1.09.   ASSESSMENT AND PLAN:  1. CAD/old MI: Brilinta was changed to clopidogrel at the last visit which was a virtual visit in July 2020.  Continue aggressive secondary prevention.  Continue regular exercise. No bleeding issues. 2. Hyperlipidemia: Zetia was added at the July 2020 visit .  LDL was 79 in January 2021.  It did increase somewhat from 59 in October.  HDL increased to 46.   3. PVCs: Sx controlled. Very rare occurrences.  Much better than a few years ago.    Current medicines are reviewed at length with the patient today.  The patient concerns regarding his medicines were addressed.  The following changes have been made:  No change  Labs/ tests ordered today include:  No orders of the defined types were placed in this encounter.   Recommend 150 minutes/week of aerobic exercise Low fat, low carb, high fiber diet recommended  Disposition:   FU in 1 year   Signed, Larae Grooms, MD  05/29/2019 9:14 AM    [facility] [place], [place], [place]  [address] Phone: [phone]; Fax: [phone]

## 2019-05-29 ENCOUNTER — Other Ambulatory Visit: Payer: Self-pay

## 2019-05-29 ENCOUNTER — Ambulatory Visit (INDEPENDENT_AMBULATORY_CARE_PROVIDER_SITE_OTHER): Payer: 59 | Admitting: Interventional Cardiology

## 2019-05-29 ENCOUNTER — Encounter: Payer: Self-pay | Admitting: Interventional Cardiology

## 2019-05-29 VITALS — BP 130/72 | HR 61 | Ht 71.0 in | Wt 218.8 lb

## 2019-05-29 DIAGNOSIS — I25118 Atherosclerotic heart disease of native coronary artery with other forms of angina pectoris: Secondary | ICD-10-CM

## 2019-05-29 DIAGNOSIS — E782 Mixed hyperlipidemia: Secondary | ICD-10-CM

## 2019-05-29 DIAGNOSIS — I252 Old myocardial infarction: Secondary | ICD-10-CM

## 2019-05-29 DIAGNOSIS — I493 Ventricular premature depolarization: Secondary | ICD-10-CM | POA: Diagnosis not present

## 2019-05-29 NOTE — Patient Instructions (Signed)

## 2019-06-03 ENCOUNTER — Other Ambulatory Visit: Payer: Self-pay

## 2019-06-03 ENCOUNTER — Encounter (INDEPENDENT_AMBULATORY_CARE_PROVIDER_SITE_OTHER): Payer: Self-pay | Admitting: Physician Assistant

## 2019-06-03 ENCOUNTER — Ambulatory Visit (INDEPENDENT_AMBULATORY_CARE_PROVIDER_SITE_OTHER): Payer: 59 | Admitting: Physician Assistant

## 2019-06-03 VITALS — BP 123/81 | HR 57 | Temp 98.8°F | Ht 71.0 in | Wt 213.0 lb

## 2019-06-03 DIAGNOSIS — R7303 Prediabetes: Secondary | ICD-10-CM

## 2019-06-03 DIAGNOSIS — Z9189 Other specified personal risk factors, not elsewhere classified: Secondary | ICD-10-CM

## 2019-06-03 DIAGNOSIS — E559 Vitamin D deficiency, unspecified: Secondary | ICD-10-CM | POA: Diagnosis not present

## 2019-06-03 DIAGNOSIS — E669 Obesity, unspecified: Secondary | ICD-10-CM | POA: Diagnosis not present

## 2019-06-03 DIAGNOSIS — Z6830 Body mass index (BMI) 30.0-30.9, adult: Secondary | ICD-10-CM

## 2019-06-03 MED ORDER — VITAMIN D (ERGOCALCIFEROL) 1.25 MG (50000 UNIT) PO CAPS: 50000.0000 [IU] | ORAL_CAPSULE | ORAL | 0 refills | Status: DC

## 2019-06-03 NOTE — Progress Notes (Signed)
Chief Complaint:   OBESITY Shane Wood is here to discuss his progress with his obesity treatment plan along with follow-up of his obesity related diagnoses. Shane Wood is on following a lower carbohydrate, vegetable and lean protein rich diet plan and states he is following his eating plan approximately 75% of the time. Shane Wood states he is walking for 45 minutes 6 times per week.  Today's visit was #: 30 Starting weight: 216 lbs Starting date: 08/08/2017 Today's weight: 213 lbs Today's date: 06/03/2019 Total lbs lost to date: 3 lbs Total lbs lost since last in-office visit: 0  Interim History: Shane Wood states that he is tired of the low carb plan and would like to change.  He is waiting for his weights to be delivered so that he can begin resistance training.   Subjective:   1. Vitamin D deficiency Shane Wood's Vitamin D level was 37.8 on 05/14/2019. He is currently taking vit D 50,000 IU once weekly. He denies nausea, vomiting or muscle weakness.  2. Prediabetes Shane Wood has a diagnosis of prediabetes based on his elevated HgA1c and was informed this puts him at greater risk of developing diabetes. He continues to work on diet and exercise to decrease his risk of diabetes. He denies nausea or hypoglycemia.  He is taking no medications.  Lab Results  Component Value Date   HGBA1C 5.4 05/14/2019   Lab Results  Component Value Date   INSULIN 12.7 05/14/2019   INSULIN 15.3 09/18/2018   INSULIN 17.8 03/04/2018   INSULIN 8.7 11/26/2017   INSULIN 11.8 08/08/2017   3. At risk for diabetes mellitus Shane Wood is at higher than average risk for developing diabetes due to his obesity.   Assessment/Plan:   1. Vitamin D deficiency Low Vitamin D level contributes to fatigue and are associated with obesity, breast, and colon cancer. He agrees to increase his prescription Vitamin D @50 ,000 IU to twice weekly and will follow-up for routine testing of Vitamin D, at least 2-3 times per year to avoid  over-replacement. - Vitamin D, Ergocalciferol, (DRISDOL) 1.25 MG (50000 UNIT) CAPS capsule; Take 1 capsule (50,000 Units total) by mouth 2 (two) times a week.  Dispense: 10 capsule; Refill: 0  2. Prediabetes Shane Wood will continue to work on weight loss, exercise, and decreasing simple carbohydrates to help decrease the risk of diabetes.   3. At risk for diabetes mellitus Shane Wood was given approximately 15 minutes of diabetes education and counseling today. We discussed intensive lifestyle modifications today with an emphasis on weight loss as well as increasing exercise and decreasing simple carbohydrates in his diet. We also reviewed medication options with an emphasis on risk versus benefit of those discussed.   Repetitive spaced learning was employed today to elicit superior memory formation and behavioral change.  4. Class 1 obesity with serious comorbidity and body mass index (BMI) of 30.0 to 30.9 in adult, unspecified obesity type Shane Wood is currently in the action stage of change. As such, his goal is to continue with weight loss efforts. He has agreed to the Category 2 Plan.   Exercise goals: For substantial health benefits, adults should do at least 150 minutes (2 hours and 30 minutes) a week of moderate-intensity, or 75 minutes (1 hour and 15 minutes) a week of vigorous-intensity aerobic physical activity, or an equivalent combination of moderate- and vigorous-intensity aerobic activity. Aerobic activity should be performed in episodes of at least 10 minutes, and preferably, it should be spread throughout the week. Adults should also include muscle-strengthening  activities that involve all major muscle groups on 2 or more days a week.  Behavioral modification strategies: meal planning and cooking strategies and keeping healthy foods in the home.  Shane Wood has agreed to follow-up with our clinic in 2 weeks. He was informed of the importance of frequent follow-up visits to maximize his  success with intensive lifestyle modifications for his multiple health conditions.   Objective:   Blood pressure 123/81, pulse (!) 57, temperature 98.8 F (37.1 C), temperature source Oral, height 5\' 11"  (1.803 m), weight 213 lb (96.6 kg), SpO2 97 %. Body mass index is 29.71 kg/m.  General: Cooperative, alert, well developed, in no acute distress. HEENT: Conjunctivae and lids unremarkable. Cardiovascular: Regular rhythm.  Lungs: Normal work of breathing. Neurologic: No focal deficits.   Lab Results  Component Value Date   CREATININE 1.09 05/14/2019   BUN 31 (H) 05/14/2019   NA 143 05/14/2019   K 4.7 05/14/2019   CL 105 05/14/2019   CO2 19 (L) 05/14/2019   Lab Results  Component Value Date   ALT 30 05/14/2019   AST 25 05/14/2019   ALKPHOS 54 05/14/2019   BILITOT 0.4 05/14/2019   Lab Results  Component Value Date   HGBA1C 5.4 05/14/2019   HGBA1C 5.8 12/22/2018   HGBA1C 5.6 09/18/2018   HGBA1C 5.7 (H) 03/04/2018   HGBA1C 5.5 11/26/2017   Lab Results  Component Value Date   INSULIN 12.7 05/14/2019   INSULIN 15.3 09/18/2018   INSULIN 17.8 03/04/2018   INSULIN 8.7 11/26/2017   INSULIN 11.8 08/08/2017   Lab Results  Component Value Date   TSH 2.080 08/08/2017   Lab Results  Component Value Date   CHOL 150 05/14/2019   HDL 46 05/14/2019   LDLCALC 79 05/14/2019   LDLDIRECT 105.0 07/15/2017   TRIG 141 05/14/2019   CHOLHDL 3.2 02/04/2019   Lab Results  Component Value Date   WBC 7.1 12/22/2018   HGB 13.8 12/22/2018   HCT 40.9 12/22/2018   MCV 87.4 12/22/2018   PLT 213.0 12/22/2018   Lab Results  Component Value Date   IRON 103 12/22/2018   FERRITIN 96.8 12/22/2018   Attestation Statements:   Reviewed by clinician on day of visit: allergies, medications, problem list, medical history, surgical history, family history, social history, and previous encounter notes.  I, Water quality scientist, CMA, am acting as Location manager for [employer], PA-C.  I have  reviewed the above documentation for accuracy and completeness, and I agree with the above. Abby Potash, PA-C

## 2019-06-05 ENCOUNTER — Other Ambulatory Visit (INDEPENDENT_AMBULATORY_CARE_PROVIDER_SITE_OTHER): Payer: Self-pay | Admitting: Physician Assistant

## 2019-06-05 DIAGNOSIS — E559 Vitamin D deficiency, unspecified: Secondary | ICD-10-CM

## 2019-06-23 ENCOUNTER — Other Ambulatory Visit: Payer: Self-pay

## 2019-06-23 ENCOUNTER — Encounter (INDEPENDENT_AMBULATORY_CARE_PROVIDER_SITE_OTHER): Payer: Self-pay | Admitting: Physician Assistant

## 2019-06-23 ENCOUNTER — Ambulatory Visit (INDEPENDENT_AMBULATORY_CARE_PROVIDER_SITE_OTHER): Payer: 59 | Admitting: Physician Assistant

## 2019-06-23 VITALS — BP 144/78 | HR 72 | Temp 97.9°F | Ht 71.0 in | Wt 216.0 lb

## 2019-06-23 DIAGNOSIS — E559 Vitamin D deficiency, unspecified: Secondary | ICD-10-CM | POA: Diagnosis not present

## 2019-06-23 DIAGNOSIS — E7849 Other hyperlipidemia: Secondary | ICD-10-CM | POA: Diagnosis not present

## 2019-06-23 DIAGNOSIS — E669 Obesity, unspecified: Secondary | ICD-10-CM

## 2019-06-23 DIAGNOSIS — Z9189 Other specified personal risk factors, not elsewhere classified: Secondary | ICD-10-CM

## 2019-06-23 DIAGNOSIS — Z6830 Body mass index (BMI) 30.0-30.9, adult: Secondary | ICD-10-CM

## 2019-06-23 MED ORDER — VITAMIN D (ERGOCALCIFEROL) 1.25 MG (50000 UNIT) PO CAPS: 50000.0000 [IU] | ORAL_CAPSULE | ORAL | 0 refills | Status: DC

## 2019-06-23 NOTE — Progress Notes (Signed)
Chief Complaint:   OBESITY Shane Wood is here to discuss his progress with his obesity treatment plan along with follow-up of his obesity related diagnoses. Shane Wood is on the Category 2 Plan and states he is following his eating plan approximately 60-70% of the time. Shane Wood states he is walking for 40-45 minutes 6 times per week.  Today's visit was #: 55 Starting weight: 216 lbs Starting date: 08/08/2017 Today's weight: 216 lbs Today's date: 06/23/2019 Total lbs lost to date: 0 Total lbs lost since last in-office visit: 0  Interim History: Shane Wood reports that he is tired of thinking about losing weight and wants to take a break.  He is thinking he will return to our clinic at the end of the summer.  Subjective:   1. Vitamin D deficiency Shane Wood's Vitamin D level was 37.8 on 05/14/2019. He is currently taking vit D. He denies nausea, vomiting or muscle weakness.  2. Other hyperlipidemia Shane Wood has hyperlipidemia and has been trying to improve his cholesterol levels with intensive lifestyle modification including a low saturated fat diet, exercise and weight loss. He denies any chest pain, claudication or myalgias.  He is taking Zetia and Lipitor.  He sees Cardiology.  Lab Results  Component Value Date   ALT 30 05/14/2019   AST 25 05/14/2019   ALKPHOS 54 05/14/2019   BILITOT 0.4 05/14/2019   Lab Results  Component Value Date   CHOL 150 05/14/2019   HDL 46 05/14/2019   LDLCALC 79 05/14/2019   LDLDIRECT 105.0 07/15/2017   TRIG 141 05/14/2019   CHOLHDL 3.2 02/04/2019   3. At risk for osteoporosis Shane Wood is at higher risk of osteopenia and osteoporosis due to Vitamin D deficiency.   Assessment/Plan:   1. Vitamin D deficiency Low Vitamin D level contributes to fatigue and are associated with obesity, breast, and colon cancer. He agrees to continue to take prescription Vitamin D @50 ,000 IU every week and will follow-up for routine testing of Vitamin D, at least 2-3 times per  year to avoid over-replacement.  He will change to OTC 5000 IU when finished with prescription vitamin D. - Vitamin D, Ergocalciferol, (DRISDOL) 1.25 MG (50000 UNIT) CAPS capsule; Take 1 capsule (50,000 Units total) by mouth 2 (two) times a week.  Dispense: 10 capsule; Refill: 0  2. Other hyperlipidemia Cardiovascular risk and specific lipid/LDL goals reviewed.  We discussed several lifestyle modifications today and [place] will continue to work on diet, exercise and weight loss efforts. Orders and follow up as documented in patient record.  Shane Wood will follow up with Cardiology and continue his medications.   Counseling Intensive lifestyle modifications are the first line treatment for this issue. . Dietary changes: Increase soluble fiber. Decrease simple carbohydrates. . Exercise changes: Moderate to vigorous-intensity aerobic activity 150 minutes per week if tolerated. . Lipid-lowering medications: see documented in medical record.  3. At risk for osteoporosis Shane Wood was given approximately 15 minutes of osteoporosis prevention counseling today. Shane Wood is at risk for osteopenia and osteoporosis due to his Vitamin D deficiency. He was encouraged to take his Vitamin D and follow his higher calcium diet and increase strengthening exercise to help strengthen his bones and decrease his risk of osteopenia and osteoporosis.  Repetitive spaced learning was employed today to elicit superior memory formation and behavioral change.  4. Class 1 obesity with serious comorbidity and body mass index (BMI) of 30.0 to 30.9 in adult, unspecified obesity type Shane Wood is currently in the action stage of change. As  such, his goal is to continue with weight loss efforts. He has agreed to the Category 2 Plan.   Exercise goals: As is.  Behavioral modification strategies: increasing lean protein intake and increasing water intake.  Shane Wood has agreed to follow-up with our clinic will call. He was informed of  the importance of frequent follow-up visits to maximize his success with intensive lifestyle modifications for his multiple health conditions.   Objective:   Blood pressure (!) 144/78, pulse 72, temperature 97.9 F (36.6 C), temperature source Oral, height 5\' 11"  (1.803 m), weight 216 lb (98 kg), SpO2 98 %. Body mass index is 30.13 kg/m.  General: Cooperative, alert, well developed, in no acute distress. HEENT: Conjunctivae and lids unremarkable. Cardiovascular: Regular rhythm.  Lungs: Normal work of breathing. Neurologic: No focal deficits.   Lab Results  Component Value Date   CREATININE 1.09 05/14/2019   BUN 31 (H) 05/14/2019   NA 143 05/14/2019   K 4.7 05/14/2019   CL 105 05/14/2019   CO2 19 (L) 05/14/2019   Lab Results  Component Value Date   ALT 30 05/14/2019   AST 25 05/14/2019   ALKPHOS 54 05/14/2019   BILITOT 0.4 05/14/2019   Lab Results  Component Value Date   HGBA1C 5.4 05/14/2019   HGBA1C 5.8 12/22/2018   HGBA1C 5.6 09/18/2018   HGBA1C 5.7 (H) 03/04/2018   HGBA1C 5.5 11/26/2017   Lab Results  Component Value Date   INSULIN 12.7 05/14/2019   INSULIN 15.3 09/18/2018   INSULIN 17.8 03/04/2018   INSULIN 8.7 11/26/2017   INSULIN 11.8 08/08/2017   Lab Results  Component Value Date   TSH 2.080 08/08/2017   Lab Results  Component Value Date   CHOL 150 05/14/2019   HDL 46 05/14/2019   LDLCALC 79 05/14/2019   LDLDIRECT 105.0 07/15/2017   TRIG 141 05/14/2019   CHOLHDL 3.2 02/04/2019   Lab Results  Component Value Date   WBC 7.1 12/22/2018   HGB 13.8 12/22/2018   HCT 40.9 12/22/2018   MCV 87.4 12/22/2018   PLT 213.0 12/22/2018   Lab Results  Component Value Date   IRON 103 12/22/2018   FERRITIN 96.8 12/22/2018   Attestation Statements:   Reviewed by clinician on day of visit: allergies, medications, problem list, medical history, surgical history, family history, social history, and previous encounter notes.  I, Water quality scientist, CMA, am acting  as Location manager for [employer], PA-C.  I have reviewed the above documentation for accuracy and completeness, and I agree with the above. Abby Potash, PA-C

## 2019-06-27 ENCOUNTER — Other Ambulatory Visit (INDEPENDENT_AMBULATORY_CARE_PROVIDER_SITE_OTHER): Payer: Self-pay | Admitting: Physician Assistant

## 2019-06-27 DIAGNOSIS — E559 Vitamin D deficiency, unspecified: Secondary | ICD-10-CM

## 2019-07-03 ENCOUNTER — Ambulatory Visit: Payer: 59 | Attending: Internal Medicine

## 2019-07-03 DIAGNOSIS — Z23 Encounter for immunization: Secondary | ICD-10-CM

## 2019-07-03 NOTE — Progress Notes (Signed)
   Covid-19 Vaccination Clinic  Name:  Shane Wood    MRN: [id number] DOB: July 16, 1955  07/03/2019  [name] was observed post Covid-19 immunization for 15 minutes without incident. He was provided with Vaccine Information Sheet and instruction to access the V-Safe system.   [name] was instructed to call 911 with any severe reactions post vaccine: [name] Difficulty breathing  . Swelling of face and throat  . A fast heartbeat  . A bad rash all over body  . Dizziness and weakness   Immunizations Administered    Name Date Dose VIS Date Route   Pfizer COVID-19 Vaccine 07/03/2019  8:29 AM 0.3 mL 04/03/2019 Intramuscular   Manufacturer: [place]   Lot: [id number]   [place]: [id number]

## 2019-07-15 ENCOUNTER — Other Ambulatory Visit: Payer: Self-pay | Admitting: Physician Assistant

## 2019-07-15 NOTE — Telephone Encounter (Signed)
Refill request

## 2019-07-20 ENCOUNTER — Other Ambulatory Visit (INDEPENDENT_AMBULATORY_CARE_PROVIDER_SITE_OTHER): Payer: Self-pay | Admitting: Physician Assistant

## 2019-07-20 DIAGNOSIS — E559 Vitamin D deficiency, unspecified: Secondary | ICD-10-CM

## 2019-07-29 ENCOUNTER — Ambulatory Visit: Payer: 59 | Attending: Internal Medicine

## 2019-07-29 DIAGNOSIS — Z23 Encounter for immunization: Secondary | ICD-10-CM

## 2019-07-29 NOTE — Progress Notes (Signed)
   Covid-19 Vaccination Clinic  Name:  Shane Wood    MRN: [id number] DOB: [birth year]  07/29/2019  [name] was observed post Covid-19 immunization for 15 minutes without incident. He was provided with Vaccine Information Sheet and instruction to access the V-Safe system.   [name] was instructed to call 911 with any severe reactions post vaccine: [name] Difficulty breathing  . Swelling of face and throat  . A fast heartbeat  . A bad rash all over body  . Dizziness and weakness   Immunizations Administered    Name Date Dose VIS Date Route   Pfizer COVID-19 Vaccine 07/29/2019  8:11 AM 0.3 mL 04/03/2019 Intramuscular   Manufacturer: [place]   Lot: [id number]   [place]: [id number]

## 2019-10-26 ENCOUNTER — Other Ambulatory Visit: Payer: Self-pay | Admitting: Interventional Cardiology

## 2019-10-27 ENCOUNTER — Other Ambulatory Visit: Payer: Self-pay | Admitting: Physician Assistant

## 2019-10-27 NOTE — Telephone Encounter (Signed)
Refill Request.  

## 2019-12-23 ENCOUNTER — Encounter: Payer: 59 | Admitting: Internal Medicine

## 2020-01-01 ENCOUNTER — Ambulatory Visit (INDEPENDENT_AMBULATORY_CARE_PROVIDER_SITE_OTHER): Payer: BC Managed Care – PPO | Admitting: Internal Medicine

## 2020-01-01 ENCOUNTER — Other Ambulatory Visit: Payer: Self-pay

## 2020-01-01 ENCOUNTER — Encounter: Payer: Self-pay | Admitting: Internal Medicine

## 2020-01-01 VITALS — BP 137/89 | HR 80 | Temp 98.1°F | Resp 16 | Ht 71.0 in | Wt 225.5 lb

## 2020-01-01 DIAGNOSIS — Z23 Encounter for immunization: Secondary | ICD-10-CM

## 2020-01-01 DIAGNOSIS — E782 Mixed hyperlipidemia: Secondary | ICD-10-CM

## 2020-01-01 DIAGNOSIS — L821 Other seborrheic keratosis: Secondary | ICD-10-CM

## 2020-01-01 DIAGNOSIS — E559 Vitamin D deficiency, unspecified: Secondary | ICD-10-CM

## 2020-01-01 DIAGNOSIS — Z0000 Encounter for general adult medical examination without abnormal findings: Secondary | ICD-10-CM

## 2020-01-01 NOTE — Patient Instructions (Signed)
Check the  blood pressure 2 or 3 times a month   BP GOAL is between 110/65 and  135/85. If it is consistently higher or lower, let me know    GO TO THE LAB : Get the blood work     [place], [place] back for nurse visit for your Shingrix No. 2  Come back for a physical exam in 1 year

## 2020-01-01 NOTE — Progress Notes (Signed)
Pre visit review using our clinic review tool, if applicable. No additional management support is needed unless otherwise documented below in the visit note. 

## 2020-01-01 NOTE — Progress Notes (Signed)
Subjective:    Patient ID: Shane Wood, male    DOB: 12/13/1955, 64 y.o.   MRN: [id number]  DOS:  01/01/2020 Type of visit - description: CPX In general feeling well.   Wt Readings from Last 3 Encounters:  01/01/20 225 lb 8 oz (102.3 kg)  06/23/19 216 lb (98 kg)  06/03/19 213 lb (96.6 kg)   BP Readings from Last 3 Encounters:  01/01/20 137/89  06/23/19 (!) 144/78  06/03/19 123/81     Review of Systems Has a couple of  skin lesions  Other than above, a 14 point review of systems is negative     Past Medical History:  Diagnosis Date  . Allergy   . BACK PAIN   . ELEVATED BP READING WITHOUT DX HYPERTENSION 05/03/2008   Qualifier: Diagnosis of  By: Larose Kells MD, [place] GERD (gastroesophageal reflux disease)   . H/O cardiovascular stress test 2005    (-)  . Hyperlipidemia   . INSOMNIA-SLEEP DISORDER-UNSPEC   . PVC (premature ventricular contraction)     Past Surgical History:  Procedure Laterality Date  . ARTERY REPAIR Right 08/23/2017   Procedure: EXPLORATION RIGHT RADIAL ARTERY,  RIGHT FOREARM FASCIOTOMY;  Surgeon: Elam Dutch, MD;  Location: [facility];  Service: Vascular;  Laterality: Right;  . CHOLECYSTECTOMY    . CORONARY STENT INTERVENTION N/A 08/23/2017   Procedure: CORONARY STENT INTERVENTION;  Surgeon: Jettie Booze, MD;  Location: [place] [facility];  Service: Cardiovascular;  Laterality: N/A;  . LEFT HEART CATH AND CORONARY ANGIOGRAPHY N/A 08/23/2017   Procedure: LEFT HEART CATH AND CORONARY ANGIOGRAPHY;  Surgeon: Jettie Booze, MD;  Location: [place] [facility];  Service: Cardiovascular;  Laterality: N/A;  . POLYPECTOMY    . TONSILLECTOMY     age 84 y/o    Allergies as of 01/01/2020   No Known Allergies     Medication List       Accurate as of January 01, 2020 11:59 PM. If you have any questions, ask your nurse or doctor.        atorvastatin 80 MG tablet Commonly known as: LIPITOR TAKE 1 TABLET (80 MG TOTAL) BY MOUTH DAILY AT 6  PM.   clopidogrel 75 MG tablet Commonly known as: PLAVIX Take 1 tablet (75 mg total) by mouth daily.   ezetimibe 10 MG tablet Commonly known as: ZETIA TAKE 1 TABLET BY MOUTH EVERY DAY   metoprolol tartrate 25 MG tablet Commonly known as: LOPRESSOR TAKE HALF A TABLET (12.5 MG TOTAL) BY MOUTH 2 (TWO) TIMES DAILY.   multivitamin tablet Take 1 tablet by mouth daily.   nitroGLYCERIN 0.4 MG SL tablet Commonly known as: NITROSTAT Place 1 tablet (0.4 mg total) under the tongue every 5 (five) minutes x 3 doses as needed for chest pain.   pantoprazole 40 MG tablet Commonly known as: PROTONIX TAKE 1 TABLET BY MOUTH EVERY DAY   VITAMIN B 12 PO Take 1 tablet by mouth daily.   vitamin C 1000 MG tablet Take 1,000 mg by mouth daily.   Vitamin D (Ergocalciferol) 1.25 MG (50000 UNIT) Caps capsule Commonly known as: DRISDOL Take 1 capsule (50,000 Units total) by mouth 2 (two) times a week.   Vitamin D 125 MCG (5000 UT) Caps          Objective:   Physical Exam BP 137/89 (BP Location: Left Arm, Patient Position: Sitting, Cuff Size: Normal)   Pulse 80   Temp 98.1 F (36.7  C) (Oral)   Resp 16   Ht 5\' 11"  (1.803 m)   Wt 225 lb 8 oz (102.3 kg)   SpO2 97%   BMI 31.45 kg/m  General: Well developed, NAD, BMI noted Neck: No  thyromegaly  HEENT:  Normocephalic . Face symmetric, atraumatic Lungs:  CTA B Normal respiratory effort, no intercostal retractions, no accessory muscle use. Heart: RRR,  no murmur.  Abdomen:  Not distended, soft, non-tender. No rebound or rigidity.   Lower extremities: no pretibial edema bilaterally  Skin: Right temple: Reports a lesion there, exam is benign today. At the back she has several SKs, one of them is irritated. Neurologic:  alert & oriented X3.  Speech normal, gait appropriate for age and unassisted Strength symmetric and appropriate for age.  Psych: Cognition and judgment appear intact.  Cooperative with normal attention span and  concentration.  Behavior appropriate. No anxious or depressed appearing.     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  Otherwise felt to be stable and was rec to  RTC 1 year.  Indications for use NTG and go to the ER discussed with the patient. Elevated BP, history of: Does not seem to be an issue at this point rtc 1 year    this visit occurred during the SARS-CoV-2 public health emergency.  Safety protocols were in place, including screening questions prior to the visit, additional usage of staff PPE, and extensive cleaning of exam room while observing appropriate contact time as indicated for disinfecting solutions.

## 2020-01-02 LAB — COMPREHENSIVE METABOLIC PANEL
AG Ratio: 1.9 (calc) (ref 1.0–2.5)
ALT: 37 U/L (ref 9–46)
AST: 30 U/L (ref 10–35)
Albumin: 4.6 g/dL (ref 3.6–5.1)
Alkaline phosphatase (APISO): 45 U/L (ref 35–144)
BUN: 22 mg/dL (ref 7–25)
CO2: 21 mmol/L (ref 20–32)
Calcium: 9.2 mg/dL (ref 8.6–10.3)
Chloride: 104 mmol/L (ref 98–110)
Creat: 1.08 mg/dL (ref 0.70–1.25)
Globulin: 2.4 g/dL (calc) (ref 1.9–3.7)
Glucose, Bld: 78 mg/dL (ref 65–99)
Potassium: 4.2 mmol/L (ref 3.5–5.3)
Sodium: 140 mmol/L (ref 135–146)
Total Bilirubin: 0.7 mg/dL (ref 0.2–1.2)
Total Protein: 7 g/dL (ref 6.1–8.1)

## 2020-01-02 LAB — LIPID PANEL
Cholesterol: 152 mg/dL (ref <200)
HDL: 45 mg/dL (ref >=40)
LDL Cholesterol (Calc): 82 mg/dL (calc)
Non-HDL Cholesterol (Calc): 107 mg/dL (calc) (ref <130)
Total CHOL/HDL Ratio: 3.4 (calc) (ref <5.0)
Triglycerides: 150 mg/dL — ABNORMAL HIGH (ref <150)

## 2020-01-02 LAB — CBC WITH DIFFERENTIAL/PLATELET
Absolute Monocytes: 528 cells/uL (ref 200–950)
Basophils Absolute: 18 cells/uL (ref 0–200)
Basophils Relative: 0.3 %
Eosinophils Absolute: 138 cells/uL (ref 15–500)
Eosinophils Relative: 2.3 %
HCT: 41.2 % (ref 38.5–50.0)
Hemoglobin: 14 g/dL (ref 13.2–17.1)
Lymphs Abs: 1692 cells/uL (ref 850–3900)
MCH: 29 pg (ref 27.0–33.0)
MCHC: 34 g/dL (ref 32.0–36.0)
MCV: 85.5 fL (ref 80.0–100.0)
MPV: 10.3 fL (ref 7.5–12.5)
Monocytes Relative: 8.8 %
Neutro Abs: 3624 cells/uL (ref 1500–7800)
Neutrophils Relative %: 60.4 %
Platelets: 250 10*3/uL (ref 140–400)
RBC: 4.82 10*6/uL (ref 4.20–5.80)
RDW: 13.5 % (ref 11.0–15.0)
Total Lymphocyte: 28.2 %
WBC: 6 10*3/uL (ref 3.8–10.8)

## 2020-01-02 LAB — TSH: TSH: 1.83 mIU/L (ref 0.40–4.50)

## 2020-01-02 LAB — VITAMIN D 25 HYDROXY (VIT D DEFICIENCY, FRACTURES): Vit D, 25-Hydroxy: 36 ng/mL (ref 30–100)

## 2020-01-03 ENCOUNTER — Encounter: Payer: Self-pay | Admitting: Internal Medicine

## 2020-01-03 NOTE — Assessment & Plan Note (Signed)
Here for CPX Dyslipidemia: On Lipitor and Zetia, checking labs CAD Saw cardiology 05-2019, rec aggressive secondary prevention.  Otherwise felt to be stable and was rec to  RTC 1 year.  Indications for use NTG and go to the ER discussed with the patient. Elevated BP, history of: Does not seem to be an issue at this point rtc 1 year

## 2020-01-03 NOTE — Assessment & Plan Note (Signed)
-  Td 2017 -s/p zostavax  - shingrix: #1 today, come back in 2 months. - s/p C-19 shots - PNM 23: 11/2018 - flu shot rec q year --CCS: Colonoscopy 09/2010, cscope again 8-2018Dr. Ardis Hughs.Next per GI --Prostate ca screening: DRE & PSA wnl 2020  --Exercise,diet:  used to go to the wellness clinic, + weight gain since he stopped. Options to improve diet weight loss: continue with portion control and decrease carbohydrate intake as he is doing, weight watchers, go back to the wellness clinic etc. - labs CMP, FLP, CBC, TSH, vitamin D

## 2020-01-05 MED ORDER — ROSUVASTATIN CALCIUM 40 MG PO TABS: 40.0000 mg | ORAL_TABLET | Freq: Every day | ORAL | 2 refills | Status: DC

## 2020-01-05 NOTE — Addendum Note (Signed)
Addended byDamita Dunnings D on: 01/05/2020 09:25 AM   Modules accepted: Orders

## 2020-02-17 ENCOUNTER — Other Ambulatory Visit: Payer: Self-pay

## 2020-02-17 ENCOUNTER — Other Ambulatory Visit (INDEPENDENT_AMBULATORY_CARE_PROVIDER_SITE_OTHER): Payer: BC Managed Care – PPO

## 2020-02-17 DIAGNOSIS — E782 Mixed hyperlipidemia: Secondary | ICD-10-CM | POA: Diagnosis not present

## 2020-02-18 LAB — AST: AST: 25 U/L (ref 10–35)

## 2020-02-18 LAB — LIPID PANEL
Cholesterol: 131 mg/dL (ref <200)
HDL: 38 mg/dL — ABNORMAL LOW (ref >=40)
LDL Cholesterol (Calc): 63 mg/dL (calc)
Non-HDL Cholesterol (Calc): 93 mg/dL (calc) (ref <130)
Total CHOL/HDL Ratio: 3.4 (calc) (ref <5.0)
Triglycerides: 243 mg/dL — ABNORMAL HIGH (ref <150)

## 2020-02-18 LAB — ALT: ALT: 34 U/L (ref 9–46)

## 2020-02-22 ENCOUNTER — Encounter: Payer: Self-pay | Admitting: Internal Medicine

## 2020-02-22 MED ORDER — ROSUVASTATIN CALCIUM 40 MG PO TABS
40.0000 mg | ORAL_TABLET | Freq: Every day | ORAL | 3 refills | Status: DC
Start: 2020-02-22 — End: 2021-03-09

## 2020-02-22 NOTE — Addendum Note (Signed)
Addended byDamita Dunnings D on: 02/22/2020 08:04 AM   Modules accepted: Orders

## 2020-03-04 ENCOUNTER — Other Ambulatory Visit: Payer: Self-pay

## 2020-03-04 ENCOUNTER — Ambulatory Visit (INDEPENDENT_AMBULATORY_CARE_PROVIDER_SITE_OTHER): Payer: BC Managed Care – PPO

## 2020-03-04 DIAGNOSIS — Z23 Encounter for immunization: Secondary | ICD-10-CM | POA: Diagnosis not present

## 2020-03-04 NOTE — Progress Notes (Signed)
Pt is here today for shingrix vaccine. Pt was given shingrix vaccine in right deltoid. Pt tolerated well/

## 2020-03-08 DIAGNOSIS — H43812 Vitreous degeneration, left eye: Secondary | ICD-10-CM | POA: Diagnosis not present

## 2020-03-08 DIAGNOSIS — H35033 Hypertensive retinopathy, bilateral: Secondary | ICD-10-CM | POA: Diagnosis not present

## 2020-03-08 DIAGNOSIS — H16403 Unspecified corneal neovascularization, bilateral: Secondary | ICD-10-CM | POA: Diagnosis not present

## 2020-03-08 DIAGNOSIS — H2513 Age-related nuclear cataract, bilateral: Secondary | ICD-10-CM | POA: Diagnosis not present

## 2020-05-17 DIAGNOSIS — L570 Actinic keratosis: Secondary | ICD-10-CM | POA: Diagnosis not present

## 2020-05-17 DIAGNOSIS — L2084 Intrinsic (allergic) eczema: Secondary | ICD-10-CM | POA: Diagnosis not present

## 2020-05-17 DIAGNOSIS — D2262 Melanocytic nevi of left upper limb, including shoulder: Secondary | ICD-10-CM | POA: Diagnosis not present

## 2020-05-17 DIAGNOSIS — D225 Melanocytic nevi of trunk: Secondary | ICD-10-CM | POA: Diagnosis not present

## 2020-06-08 NOTE — Progress Notes (Signed)
Cardiology Office Note   Date:  06/10/2020   ID:  Shane Wood, DOB 1955-06-09, MRN [id number]  PCP:  Colon Branch, MD    No chief complaint on file.  CAD  Wt Readings from Last 3 Encounters:  06/10/20 218 lb 12.8 oz (99.2 kg)  01/01/20 225 lb 8 oz (102.3 kg)  06/23/19 216 lb (98 kg)       History of Present Illness: Shane Wood is a 65 y.o. 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 He was admitted to [facility] with NSTEMI 08/23/2017 and underwent cardiac catheterization with DES to the proximal to mid RCA and ostial PDA, residual 50% proximal LAD, 80% circumflex, total OM1 normal LVEF 50 to 55% with mild aortic stenosis.Patient developed compartment syndrome of the right forearm post procedure and underwent exploration of the right forearm. Radial artery was identified and intact median nerve and ulnar neurovascular bundle intact. Wound was irrigated and loosely closed.Follow-up with vascular surgery of as well as Dr. Durward Fortes that arm had healed well.Also has history of PVCs and HLD.  He thinks he had a lot of stress in the months before the MI.MI sx were pressure and a deep ache.  Immediately after discharge, he had some chest twinges.  He uses a Interior and spatial designer at home.He has continued to do this during the Pandemic.  He also was going to the weight management center to help with weight loss.  Back pain has limited his exercise.  He does some work from home.    He has used a low carb diet.    Denies : Chest pain. Dizziness. Leg edema. Nitroglycerin use. Orthopnea. Palpitations. Paroxysmal nocturnal dyspnea. Shortness of breath. Syncope.   Intentional weight loss. He has avoided white bread and other sugars.    Planning to retire from [employer].  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      Past Medical History:  Diagnosis Date  . Allergy    . BACK PAIN   . ELEVATED BP READING WITHOUT DX HYPERTENSION 05/03/2008   Qualifier: Diagnosis of  By: Larose Kells MD, [place] GERD (gastroesophageal reflux disease)   . H/O cardiovascular stress test 2005    (-)  . Hyperlipidemia   . INSOMNIA-SLEEP DISORDER-UNSPEC   . PVC (premature ventricular contraction)     Past Surgical History:  Procedure Laterality Date  . ARTERY REPAIR Right 08/23/2017   Procedure: EXPLORATION RIGHT RADIAL ARTERY,  RIGHT FOREARM FASCIOTOMY;  Surgeon: Elam Dutch, MD;  Location: [facility] OR;  Service: Vascular;  Laterality: Right;  . CHOLECYSTECTOMY    . CORONARY STENT INTERVENTION N/A 08/23/2017   Procedure: CORONARY STENT INTERVENTION;  Surgeon: Jettie Booze, MD;  Location: [place] [facility];  Service: Cardiovascular;  Laterality: N/A;  . LEFT HEART CATH AND CORONARY ANGIOGRAPHY N/A 08/23/2017   Procedure: LEFT HEART CATH AND CORONARY ANGIOGRAPHY;  Surgeon: Jettie Booze, MD;  Location: [place] [facility];  Service: Cardiovascular;  Laterality: N/A;  . POLYPECTOMY    . TONSILLECTOMY     age 65 y/o     Current Outpatient Medications  Medication Sig Dispense Refill  . Ascorbic Acid (VITAMIN C) 1000 MG tablet Take 1,000 mg by mouth daily.    . Cholecalciferol (VITAMIN D) 125 MCG (5000 UT) CAPS     . clopidogrel (PLAVIX) 75 MG tablet Take  1 tablet (75 mg total) by mouth daily. 90 tablet 2  . Cyanocobalamin (VITAMIN B 12 PO) Take 1 tablet by mouth daily.    [name] ezetimibe (ZETIA) 10 MG tablet TAKE 1 TABLET BY MOUTH EVERY DAY 90 tablet 2  . metoprolol tartrate (LOPRESSOR) 25 MG tablet TAKE HALF A TABLET (12.5 MG TOTAL) BY MOUTH 2 (TWO) TIMES DAILY. 90 tablet 2  . Multiple Vitamin (MULTIVITAMIN) tablet Take 1 tablet by mouth daily.    . nitroGLYCERIN (NITROSTAT) 0.4 MG SL tablet Place 1 tablet (0.4 mg total) under the tongue every 5 (five) minutes x 3 doses as needed for chest pain. 25 tablet 3  . pantoprazole (PROTONIX) 40 MG tablet TAKE 1 TABLET BY MOUTH  EVERY DAY 90 tablet 2  . rosuvastatin (CRESTOR) 40 MG tablet Take 1 tablet (40 mg total) by mouth at bedtime. 90 tablet 3   No current facility-administered medications for this visit.    Allergies:   Patient has no known allergies.    Social History:  The patient  reports that he quit smoking about 31 years ago. He quit smokeless tobacco use about 52 years ago.  His smokeless tobacco use included chew. He reports current alcohol use of about 4.0 standard drinks of alcohol per week. He reports that he does not use drugs.   Family History:  The patient's family history includes Depression in his mother; Diabetes in his maternal grandfather and maternal grandmother; Heart attack in an other family member; Hypertension in his mother; Lung cancer in his father; Obesity in his mother; Stroke in an other family member.    ROS:  Please see the history of present illness.   Otherwise, review of systems are positive for intentional weight loss.   All other systems are reviewed and negative.    PHYSICAL EXAM: VS:  BP 128/82   Pulse 72   Ht 5\' 11"  (1.803 m)   Wt 218 lb 12.8 oz (99.2 kg)   SpO2 95%   BMI 30.52 kg/m  , BMI Body mass index is 30.52 kg/m. GEN: Well nourished, well developed, in no acute distress  HEENT: normal  Neck: no JVD, carotid bruits, or masses Cardiac: RRR; no murmurs, rubs, or gallops,no edema  Respiratory:  clear to auscultation bilaterally, normal work of breathing GI: soft, nontender, nondistended, + BS MS: no deformity or atrophy ; 2+ right radial pulse Skin: warm and dry, no rash Neuro:  Strength and sensation are intact Psych: euthymic mood, full affect   EKG:   The ekg ordered today demonstrates NSR, inferior ST changes, similar to prior   Recent Labs: 01/01/2020: BUN 22; Creat 1.08; Hemoglobin 14.0; Platelets 250; Potassium 4.2; Sodium 140; TSH 1.83 02/17/2020: ALT 34   Lipid Panel    Component Value Date/Time   CHOL 131 02/17/2020 0733   CHOL 150  05/14/2019 1721   TRIG 243 (H) 02/17/2020 0733   HDL 38 (L) 02/17/2020 0733   HDL 46 05/14/2019 1721   CHOLHDL 3.4 02/17/2020 0733   VLDL 41.0 (H) 07/15/2017 0828   LDLCALC 63 02/17/2020 0733   LDLDIRECT 105.0 07/15/2017 0828     Other studies Reviewed: Additional studies/ records that were reviewed today with results demonstrating: LD 63 in 10/21.   ASSESSMENT AND PLAN:  1. CAD/old MI: Whole food, plant based diet.  No angina.  Medical therapy of small vessel circ disease.  As long as he is doing well with exercise, will continue current therapy.  He has cut back  on his red meat intake. 2. Hyperlipidemia: The current medical regimen is effective;  continue present plan and medications. 3. PVCs: No sx.  4. Obesity: Doing well with diet.  Avoiding sugar in the diet.  We also discussed intermittent fasting.      Current medicines are reviewed at length with the patient today.  The patient concerns regarding his medicines were addressed.  The following changes have been made:  No change  Labs/ tests ordered today include:  No orders of the defined types were placed in this encounter.   Recommend 150 minutes/week of aerobic exercise Low fat, low carb, high fiber diet recommended  Disposition:   FU in 1 year   Signed, Larae Grooms, MD  06/10/2020 8:48 AM    [facility] [place], [place], [place]  [address] Phone: [phone]; Fax: [phone]

## 2020-06-10 ENCOUNTER — Ambulatory Visit: Payer: BC Managed Care – PPO | Admitting: Interventional Cardiology

## 2020-06-10 ENCOUNTER — Other Ambulatory Visit: Payer: Self-pay

## 2020-06-10 ENCOUNTER — Encounter: Payer: Self-pay | Admitting: Interventional Cardiology

## 2020-06-10 VITALS — BP 128/82 | HR 72 | Ht 71.0 in | Wt 218.8 lb

## 2020-06-10 DIAGNOSIS — I25118 Atherosclerotic heart disease of native coronary artery with other forms of angina pectoris: Secondary | ICD-10-CM | POA: Diagnosis not present

## 2020-06-10 DIAGNOSIS — E782 Mixed hyperlipidemia: Secondary | ICD-10-CM | POA: Diagnosis not present

## 2020-06-10 DIAGNOSIS — I252 Old myocardial infarction: Secondary | ICD-10-CM | POA: Diagnosis not present

## 2020-06-10 DIAGNOSIS — I493 Ventricular premature depolarization: Secondary | ICD-10-CM

## 2020-06-10 NOTE — Patient Instructions (Signed)
Medication Instructions:  Your physician recommends that you continue on your current medications as directed. Please refer to the Current Medication list given to you today.  *If you need a refill on your cardiac medications before your next appointment, please call your pharmacy*   Lab Work: none If you have labs (blood work) drawn today and your tests are completely normal, you will receive your results only by: . MyChart Message (if you have MyChart) OR . A paper copy in the mail If you have any lab test that is abnormal or we need to change your treatment, we will call you to review the results.   Testing/Procedures: none   Follow-Up: At [facility], you and your health needs are our priority.  As part of our continuing mission to provide you with exceptional heart care, we have created designated Provider Care Teams.  These Care Teams include your primary Cardiologist (physician) and Advanced Practice Providers (APPs -  Physician Assistants and Nurse Practitioners) who all work together to provide you with the care you need, when you need it.  We recommend signing up for the patient portal called "MyChart".  Sign up information is provided on this After Visit Summary.  MyChart is used to connect with patients for Virtual Visits (Telemedicine).  Patients are able to view lab/test results, encounter notes, upcoming appointments, etc.  Non-urgent messages can be sent to your provider as well.   To learn more about what you can do with MyChart, go to [email or web address].    Your next appointment:   12 month(s)  The format for your next appointment:   In Person  Provider:   You may see Jayadeep Varanasi, MD or one of the following Advanced Practice Providers on your designated Care Team:    Dayna Dunn, PA-C  Michele Lenze, PA-C    Other Instructions  High-Fiber Eating Plan Fiber, also called dietary fiber, is a type of carbohydrate. It is found foods such as fruits,  vegetables, whole grains, and beans. A high-fiber diet can have many health benefits. Your health care provider may recommend a high-fiber diet to help:  Prevent constipation. Fiber can make your bowel movements more regular.  Lower your cholesterol.  Relieve the following conditions: ? Inflammation of veins in the anus (hemorrhoids). ? Inflammation of specific areas of the digestive tract (uncomplicated diverticulosis). ? A problem of the large intestine, also called the colon, that sometimes causes pain and diarrhea (irritable bowel syndrome, or IBS).  Prevent overeating as part of a weight-loss plan.  Prevent heart disease, type 2 diabetes, and certain cancers. What are tips for following this plan? Reading food labels  Check the nutrition facts label on food products for the amount of dietary fiber. Choose foods that have 5 grams of fiber or more per serving.  The goals for recommended daily fiber intake include: ? Men (age 50 or younger): 34-38 g. ? Men (over age 50): 28-34 g. ? Women (age 50 or younger): 25-28 g. ? Women (over age 50): 22-25 g. Your daily fiber goal is _____________ g.   Shopping  Choose whole fruits and vegetables instead of processed forms, such as apple juice or applesauce.  Choose a wide variety of high-fiber foods such as avocados, lentils, oats, and kidney beans.  Read the nutrition facts label of the foods you choose. Be aware of foods with added fiber. These foods often have high sugar and sodium amounts per serving. Cooking  Use whole-grain flour for baking and cooking.    Cook with brown rice instead of white rice. Meal planning  Start the day with a breakfast that is high in fiber, such as a cereal that contains 5 g of fiber or more per serving.  Eat breads and cereals that are made with whole-grain flour instead of refined flour or white flour.  Eat brown rice, bulgur wheat, or millet instead of white rice.  Use beans in place of meat in  soups, salads, and pasta dishes.  Be sure that half of the grains you eat each day are whole grains. General information  You can get the recommended daily intake of dietary fiber by: ? Eating a variety of fruits, vegetables, grains, nuts, and beans. ? Taking a fiber supplement if you are not able to take in enough fiber in your diet. It is better to get fiber through food than from a supplement.  Gradually increase how much fiber you consume. If you increase your intake of dietary fiber too quickly, you may have bloating, cramping, or gas.  Drink plenty of water to help you digest fiber.  Choose high-fiber snacks, such as berries, raw vegetables, nuts, and popcorn. What foods should I eat? Fruits Berries. Pears. Apples. Oranges. Avocado. Prunes and raisins. Dried figs. Vegetables Sweet potatoes. Spinach. Kale. Artichokes. Cabbage. Broccoli. Cauliflower. Green peas. Carrots. Squash. Grains Whole-grain breads. Multigrain cereal. Oats and oatmeal. Brown rice. Barley. Bulgur wheat. Millet. Quinoa. Bran muffins. Popcorn. Rye wafer crackers. Meats and other proteins Navy beans, kidney beans, and pinto beans. Soybeans. Split peas. Lentils. Nuts and seeds. Dairy Fiber-fortified yogurt. Beverages Fiber-fortified soy milk. Fiber-fortified orange juice. Other foods Fiber bars. The items listed above may not be a complete list of recommended foods and beverages. Contact a dietitian for more information. What foods should I avoid? Fruits Fruit juice. Cooked, strained fruit. Vegetables Fried potatoes. Canned vegetables. Well-cooked vegetables. Grains White bread. Pasta made with refined flour. White rice. Meats and other proteins Fatty cuts of meat. Fried chicken or fried fish. Dairy Milk. Yogurt. Cream cheese. Sour cream. Fats and oils Butters. Beverages Soft drinks. Other foods Cakes and pastries. The items listed above may not be a complete list of foods and beverages to avoid.  Talk with your dietitian about what choices are best for you. Summary  Fiber is a type of carbohydrate. It is found in foods such as fruits, vegetables, whole grains, and beans.  A high-fiber diet has many benefits. It can help to prevent constipation, lower blood cholesterol, aid weight loss, and reduce your risk of heart disease, diabetes, and certain cancers.  Increase your intake of fiber gradually. Increasing fiber too quickly may cause cramping, bloating, and gas. Drink plenty of water while you increase the amount of fiber you consume.  The best sources of fiber include whole fruits and vegetables, whole grains, nuts, seeds, and beans. This information is not intended to replace advice given to you by your health care provider. Make sure you discuss any questions you have with your health care provider. Document Revised: 08/13/2019 Document Reviewed: 08/13/2019 Elsevier Patient Education  2021 Elsevier Inc.   

## 2020-06-15 NOTE — Addendum Note (Signed)
Addended by: Gar Ponto on: 06/15/2020 07:22 AM   Modules accepted: Orders

## 2020-07-13 DIAGNOSIS — R202 Paresthesia of skin: Secondary | ICD-10-CM | POA: Diagnosis not present

## 2020-07-26 ENCOUNTER — Other Ambulatory Visit: Payer: Self-pay | Admitting: Interventional Cardiology

## 2020-10-17 ENCOUNTER — Other Ambulatory Visit: Payer: Self-pay | Admitting: Physician Assistant

## 2020-10-27 ENCOUNTER — Encounter: Payer: Self-pay | Admitting: Internal Medicine

## 2020-12-13 ENCOUNTER — Encounter: Payer: Self-pay | Admitting: Internal Medicine

## 2020-12-13 ENCOUNTER — Telehealth (INDEPENDENT_AMBULATORY_CARE_PROVIDER_SITE_OTHER): Payer: Medicare PPO | Admitting: Internal Medicine

## 2020-12-13 VITALS — Temp 97.0°F | Ht 71.0 in | Wt 223.0 lb

## 2020-12-13 DIAGNOSIS — U071 COVID-19: Secondary | ICD-10-CM

## 2020-12-13 MED ORDER — MOLNUPIRAVIR EUA 200MG CAPSULE: 4.0000 | ORAL_CAPSULE | Freq: Two times a day (BID) | ORAL | 0 refills | Status: AC

## 2020-12-13 NOTE — Progress Notes (Signed)
Subjective:    Patient ID: Shane Wood, male    DOB: [birth year], 65 y.o.   MRN: [id number]  DOS:  12/13/2020 Type of visit - description: Virtual Visit via Video Note  I connected with the above patient  by a video enabled telemedicine application and verified that I am speaking with the correct person using two identifiers.   THIS ENCOUNTER IS A VIRTUAL VISIT DUE TO COVID-19 - PATIENT WAS NOT SEEN IN THE OFFICE. PATIENT HAS CONSENTED TO VIRTUAL VISIT / TELEMEDICINE VISIT   Location of patient: home  Location of provider: office  Persons participating in the virtual visit: patient, provider   I discussed the limitations of evaluation and management by telemedicine and the availability of in person appointments. The patient expressed understanding and agreed to proceed.   Acute The patient was visiting [place] when he started to develop symptoms: 3 days ago developed sore throat, the next day also a headache and then developed cough and chest congestion. Last night, he tested positive for COVID. Today he feels about the same. Has checking temperature: 97.4. Had few chills. Denies chest pain or difficulty breathing. He has some sputum production only in the mornings. No nausea vomiting or myalgias.     Review of Systems See above   Past Medical History:  Diagnosis Date   Allergy    BACK PAIN    ELEVATED BP READING WITHOUT DX HYPERTENSION 05/03/2008   Qualifier: Diagnosis of  By: Larose Kells MD, Alda Berthold.    GERD (gastroesophageal reflux disease)    H/O cardiovascular stress test 2005    (-)   Hyperlipidemia    INSOMNIA-SLEEP DISORDER-UNSPEC    PVC (premature ventricular contraction)     Past Surgical History:  Procedure Laterality Date   ARTERY REPAIR Right 08/23/2017   Procedure: EXPLORATION RIGHT RADIAL ARTERY,  RIGHT FOREARM FASCIOTOMY;  Surgeon: Elam Dutch, MD;  Location: [facility];  Service: Vascular;  Laterality: Right;   CHOLECYSTECTOMY     CORONARY STENT  INTERVENTION N/A 08/23/2017   Procedure: CORONARY STENT INTERVENTION;  Surgeon: Jettie Booze, MD;  Location: [facility];  Service: Cardiovascular;  Laterality: N/A;   LEFT HEART CATH AND CORONARY ANGIOGRAPHY N/A 08/23/2017   Procedure: LEFT HEART CATH AND CORONARY ANGIOGRAPHY;  Surgeon: Jettie Booze, MD;  Location: [facility];  Service: Cardiovascular;  Laterality: N/A;   POLYPECTOMY     TONSILLECTOMY     age 65 y/o    Allergies as of 12/13/2020   No Known Allergies      Medication List        Accurate as of December 13, 2020  4:26 PM. If you have any questions, ask your nurse or doctor.          clopidogrel 75 MG tablet Commonly known as: PLAVIX TAKE 1 TABLET BY MOUTH EVERY DAY   ezetimibe 10 MG tablet Commonly known as: ZETIA TAKE 1 TABLET BY MOUTH EVERY DAY   metoprolol tartrate 25 MG tablet Commonly known as: LOPRESSOR TAKE HALF A TABLET (12.5 MG TOTAL) BY MOUTH 2 (TWO) TIMES DAILY.   multivitamin tablet Take 1 tablet by mouth daily.   nitroGLYCERIN 0.4 MG SL tablet Commonly known as: NITROSTAT PLACE 1 TABLET (0.4 MG TOTAL) UNDER THE TONGUE EVERY 5 (FIVE) MINUTES X 3 DOSES AS NEEDED FOR CHEST PAIN.   pantoprazole 40 MG tablet Commonly known as: PROTONIX TAKE 1 TABLET BY MOUTH EVERY DAY   rosuvastatin 40 MG tablet Commonly  known as: Crestor Take 1 tablet (40 mg total) by mouth at bedtime.   VITAMIN B 12 PO Take 1 tablet by mouth daily.   vitamin C 1000 MG tablet Take 1,000 mg by mouth daily.   Vitamin D 125 MCG (5000 UT) Caps           Objective:   Physical Exam Temp (!) 97 F (36.1 C) (Oral)   Ht '5\' 11"'$  (1.803 m)   Wt 223 lb (101.2 kg)   BMI 31.10 kg/m  This is a virtual video visit, alert oriented x3, in no apparent distress, I did notice few episodes of cough, some large airway congestion but no wheezing. 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 Advised that his chances to get worse will get lower w/ meds. We agreed on the following: Molnupiravir Rest, fluids, Mucinex DM, call if a stronger cough medication is needed, isolation for 10 days. Call if not gradually better. Seek further medical attention if severe symptoms, chest pain, difficulty breathing.   I discussed the assessment and treatment plan with the patient. The patient was provided an opportunity to ask questions and all were answered. The patient agreed with the plan and demonstrated an understanding of the instructions.   The patient was advised to call back or seek an in-person evaluation if the symptoms worsen or if the condition fails to improve as anticipated.

## 2020-12-14 NOTE — Assessment & Plan Note (Signed)
COVID-19: The patient is 5, history of CAD, on statins, presents with COVID-19, he had 4 vaccinations before, his disease seems to be mild to moderate. Advised that his chances to get worse will get lower w/ meds. We agreed on the following: Molnupiravir Rest, fluids, Mucinex DM, call if a stronger cough medication is needed, isolation for 10 days. Call if not gradually better. Seek further medical attention if severe symptoms, chest pain, difficulty breathing.

## 2021-01-05 ENCOUNTER — Other Ambulatory Visit: Payer: Self-pay

## 2021-01-05 ENCOUNTER — Encounter: Payer: Self-pay | Admitting: Internal Medicine

## 2021-01-05 ENCOUNTER — Ambulatory Visit (INDEPENDENT_AMBULATORY_CARE_PROVIDER_SITE_OTHER): Payer: Medicare PPO | Admitting: Internal Medicine

## 2021-01-05 VITALS — BP 126/84 | HR 70 | Temp 98.4°F | Resp 18 | Ht 71.0 in | Wt 228.0 lb

## 2021-01-05 DIAGNOSIS — R739 Hyperglycemia, unspecified: Secondary | ICD-10-CM

## 2021-01-05 DIAGNOSIS — Z125 Encounter for screening for malignant neoplasm of prostate: Secondary | ICD-10-CM | POA: Diagnosis not present

## 2021-01-05 DIAGNOSIS — Z0000 Encounter for general adult medical examination without abnormal findings: Secondary | ICD-10-CM | POA: Diagnosis not present

## 2021-01-05 DIAGNOSIS — Z23 Encounter for immunization: Secondary | ICD-10-CM | POA: Diagnosis not present

## 2021-01-05 DIAGNOSIS — E782 Mixed hyperlipidemia: Secondary | ICD-10-CM | POA: Diagnosis not present

## 2021-01-05 LAB — COMPREHENSIVE METABOLIC PANEL
ALT: 40 U/L (ref 0–53)
AST: 50 U/L — ABNORMAL HIGH (ref 0–37)
Albumin: 4.2 g/dL (ref 3.5–5.2)
Alkaline Phosphatase: 46 U/L (ref 39–117)
BUN: 17 mg/dL (ref 6–23)
CO2: 24 mEq/L (ref 19–32)
Calcium: 9 mg/dL (ref 8.4–10.5)
Chloride: 108 mEq/L (ref 96–112)
Creatinine, Ser: 1.04 mg/dL (ref 0.40–1.50)
GFR: 75.34 mL/min (ref >60.00)
Glucose, Bld: 92 mg/dL (ref 70–99)
Potassium: 4.3 mEq/L (ref 3.5–5.1)
Sodium: 140 mEq/L (ref 135–145)
Total Bilirubin: 0.6 mg/dL (ref 0.2–1.2)
Total Protein: 6.7 g/dL (ref 6.0–8.3)

## 2021-01-05 LAB — CBC WITH DIFFERENTIAL/PLATELET
Basophils Absolute: 0 10*3/uL (ref 0.0–0.1)
Basophils Relative: 0.2 % (ref 0.0–3.0)
Eosinophils Absolute: 0.2 10*3/uL (ref 0.0–0.7)
Eosinophils Relative: 3 % (ref 0.0–5.0)
HCT: 39.3 % (ref 39.0–52.0)
Hemoglobin: 13.1 g/dL (ref 13.0–17.0)
Lymphocytes Relative: 28.6 % (ref 12.0–46.0)
Lymphs Abs: 1.5 10*3/uL (ref 0.7–4.0)
MCHC: 33.4 g/dL (ref 30.0–36.0)
MCV: 85.5 fl (ref 78.0–100.0)
Monocytes Absolute: 0.4 10*3/uL (ref 0.1–1.0)
Monocytes Relative: 8 % (ref 3.0–12.0)
Neutro Abs: 3.2 10*3/uL (ref 1.4–7.7)
Neutrophils Relative %: 60.2 % (ref 43.0–77.0)
Platelets: 199 10*3/uL (ref 150.0–400.0)
RBC: 4.59 Mil/uL (ref 4.22–5.81)
RDW: 14.7 % (ref 11.5–15.5)
WBC: 5.4 10*3/uL (ref 4.0–10.5)

## 2021-01-05 LAB — PSA: PSA: 1.23 ng/mL (ref 0.10–4.00)

## 2021-01-05 LAB — LIPID PANEL
Cholesterol: 146 mg/dL (ref 0–200)
HDL: 51 mg/dL (ref >39.00)
LDL Cholesterol: 59 mg/dL (ref 0–99)
NonHDL: 94.68
Total CHOL/HDL Ratio: 3
Triglycerides: 180 mg/dL — ABNORMAL HIGH (ref 0.0–149.0)
VLDL: 36 mg/dL (ref 0.0–40.0)

## 2021-01-05 LAB — HEMOGLOBIN A1C: Hgb A1c MFr Bld: 5.9 % (ref 4.6–6.5)

## 2021-01-05 NOTE — Patient Instructions (Addendum)
Proceed with COVID-vaccine with a new shot in few months  GO TO THE LAB : Get the blood work     Andrew, [place] back for   a physical exam in 1 year     "Living will", "[place] of attorney": Advanced care planning  (If you already have a living will or healthcare power of attorney, please bring the copy to be scanned in your chart.)  Advance care planning is a process that supports adults in  understanding and sharing their preferences regarding future medical care.   The patient's preferences are recorded in documents called Advance Directives.    Advanced directives are completed (and can be modified at any time) while the patient is in full mental capacity.   The documentation should be available at all times to the patient, the family and the healthcare providers.  Bring in a copy to be scanned in your chart is an excellent idea and is recommended   This legal documents direct treatment decision making and/or appoint a surrogate to make the decision if the patient is not capable to do so.    Advance directives can be documented in many types of formats,  documents have names such as:  Lliving will  Durable power of attorney for healthcare (healthcare proxy or healthcare power of attorney)  Combined directives  Physician orders for life-sustaining treatment    More information at:  [email or web address]

## 2021-01-05 NOTE — Assessment & Plan Note (Signed)
-  Td 2017 - s/p zostavax ; s/p  shingrix:   - s/p C-19 shots x4, consider booster in few months  - PNM 23: 11/2018 - PNM 20: Today - flu shot: Today --CCS:  Colonoscopy 09/2010, cscope again 11-2016 Dr. Ardis Hughs. Next per GI --Prostate ca screening: DRE normal, no symptoms, check PSA --Exercise,diet: Weight gain noted.  Encourage daily exercise and a healthy diet - labs CMP, FLP, CBC, A1c, PSA - POA: Discussed

## 2021-01-05 NOTE — Progress Notes (Signed)
Subjective:    Patient ID: Shane Wood, male    DOB: Dec 21, 1955, 65 y.o.   MRN: [id number]  DOS:  01/05/2021 Type of visit - description: cpx  Here for CPX. At the last visit was Dx with COVID, he feels fully recuperated except for a mild sporadic cough.  Wt Readings from Last 3 Encounters:  01/05/21 228 lb (103.4 kg)  12/13/20 223 lb (101.2 kg)  06/10/20 218 lb 12.8 oz (99.2 kg)    Review of Systems  Other than above, a 14 point review of systems is negative      Past Medical History:  Diagnosis Date   Allergy    BACK PAIN    ELEVATED BP READING WITHOUT DX HYPERTENSION 05/03/2008   Qualifier: Diagnosis of  By: Larose Kells MD, Alda Berthold.    GERD (gastroesophageal reflux disease)    H/O cardiovascular stress test 2005    (-)   Hyperlipidemia    INSOMNIA-SLEEP DISORDER-UNSPEC    PVC (premature ventricular contraction)     Past Surgical History:  Procedure Laterality Date   ARTERY REPAIR Right 08/23/2017   Procedure: EXPLORATION RIGHT RADIAL ARTERY,  RIGHT FOREARM FASCIOTOMY;  Surgeon: Elam Dutch, MD;  Location: [facility];  Service: Vascular;  Laterality: Right;   CHOLECYSTECTOMY     CORONARY STENT INTERVENTION N/A 08/23/2017   Procedure: CORONARY STENT INTERVENTION;  Surgeon: Jettie Booze, MD;  Location: [facility];  Service: Cardiovascular;  Laterality: N/A;   LEFT HEART CATH AND CORONARY ANGIOGRAPHY N/A 08/23/2017   Procedure: LEFT HEART CATH AND CORONARY ANGIOGRAPHY;  Surgeon: Jettie Booze, MD;  Location: [facility];  Service: Cardiovascular;  Laterality: N/A;   POLYPECTOMY     TONSILLECTOMY     age 60 y/o   Social History   Socioeconomic History   Marital status: Married    Spouse name: [name]   Number of children: 2   Years of education: Not on file   Highest education level: Not on file  Occupational History   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  Tobacco Use   Smoking status: Former   Smokeless  tobacco: Former    Types: Chew    Quit date: 1970   Tobacco comments:    1 ppd , quit 1989  Vaping Use   Vaping Use: Never used  Substance and Sexual Activity   Alcohol use: Yes    Alcohol/week: 4.0 standard drinks    Types: 4 Glasses of wine per week    Comment: occasionally on weekends   Drug use: No   Sexual activity: Not on file  Other Topics Concern   Not on file  Social History Narrative   Household pt and wife    2 adult married children   Social Determinants of Health   Financial Resource Strain: Not on file  Food Insecurity: Not on file  Transportation Needs: Not on file  Physical Activity: Not on file  Stress: Not on file  Social Connections: Not on file  Intimate Partner Violence: Not on file    Allergies as of 01/05/2021   No Known Allergies      Medication List        Accurate as of January 05, 2021  3:37 PM. If you have any questions, ask your nurse or doctor.          clopidogrel 75 MG tablet Commonly known as: PLAVIX TAKE 1 TABLET BY MOUTH EVERY DAY  ezetimibe 10 MG tablet Commonly known as: ZETIA TAKE 1 TABLET BY MOUTH EVERY DAY   metoprolol tartrate 25 MG tablet Commonly known as: LOPRESSOR TAKE HALF A TABLET (12.5 MG TOTAL) BY MOUTH 2 (TWO) TIMES DAILY.   multivitamin tablet Take 1 tablet by mouth daily.   nitroGLYCERIN 0.4 MG SL tablet Commonly known as: NITROSTAT PLACE 1 TABLET (0.4 MG TOTAL) UNDER THE TONGUE EVERY 5 (FIVE) MINUTES X 3 DOSES AS NEEDED FOR CHEST PAIN.   pantoprazole 40 MG tablet Commonly known as: PROTONIX TAKE 1 TABLET BY MOUTH EVERY DAY   rosuvastatin 40 MG tablet Commonly known as: Crestor Take 1 tablet (40 mg total) by mouth at bedtime.   VITAMIN B 12 PO Take 1 tablet by mouth daily.   vitamin C 1000 MG tablet Take 1,000 mg by mouth daily.   Vitamin D 125 MCG (5000 UT) Caps           Objective:   Physical Exam BP 126/84 (BP Location: Left Arm, Patient Position: Sitting, Cuff Size:  Normal)   Pulse 70   Temp 98.4 F (36.9 C) (Oral)   Resp 18   Ht '5\' 11"'$  (1.803 m)   Wt 228 lb (103.4 kg)   SpO2 97%   BMI 31.80 kg/m  General: Well developed, NAD, BMI noted Neck: No  thyromegaly  HEENT:  Normocephalic . Face symmetric, atraumatic Lungs:  CTA B Normal respiratory effort, no intercostal retractions, no accessory muscle use. Heart: RRR,  no murmur.  Abdomen:  Not distended, soft, non-tender. No rebound or rigidity.   Lower extremities: no pretibial edema bilaterally DRE: Normal sphincter tone, brown stools, prostate wnl Skin: Exposed areas without rash. Not pale. Not jaundice Neurologic:  alert & oriented X3.  Speech normal, gait appropriate for age and unassisted Strength symmetric and appropriate for age.  Psych: Cognition and judgment appear intact.  Cooperative with normal attention span and concentration.  Behavior appropriate. No anxious or depressed appearing.     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 Hyperlipidemia: On Crestor, Zetia.  Labs. CAD: Asymptomatic. Anxiety insomnia: Not an issue at this time.   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  This visit occurred during the SARS-CoV-2 public health emergency.  Safety protocols were in place, including screening questions prior to the visit, additional usage of staff PPE, and extensive cleaning of exam room while observing appropriate contact time as indicated for disinfecting solutions.

## 2021-01-05 NOTE — Assessment & Plan Note (Signed)
Here for CPX Had COVID, feels fully recuperated. Hyperlipidemia: On Crestor, Zetia.  Labs. CAD: Asymptomatic. Anxiety insomnia: Not an issue at this time.   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

## 2021-03-09 ENCOUNTER — Other Ambulatory Visit: Payer: Self-pay | Admitting: Internal Medicine

## 2021-03-10 ENCOUNTER — Encounter (HOSPITAL_COMMUNITY): Payer: Self-pay | Admitting: Emergency Medicine

## 2021-03-10 ENCOUNTER — Emergency Department (HOSPITAL_COMMUNITY): Payer: Medicare PPO

## 2021-03-10 ENCOUNTER — Observation Stay (HOSPITAL_COMMUNITY)
Admission: EM | Admit: 2021-03-10 | Discharge: 2021-03-11 | Disposition: A | Discharge status: Home / Self Care | Payer: Medicare PPO | Attending: Internal Medicine | Admitting: Internal Medicine

## 2021-03-10 ENCOUNTER — Observation Stay (HOSPITAL_COMMUNITY): Payer: Medicare PPO

## 2021-03-10 ENCOUNTER — Other Ambulatory Visit: Payer: Self-pay

## 2021-03-10 DIAGNOSIS — R9431 Abnormal electrocardiogram [ECG] [EKG]: Secondary | ICD-10-CM | POA: Diagnosis not present

## 2021-03-10 DIAGNOSIS — H539 Unspecified visual disturbance: Secondary | ICD-10-CM | POA: Diagnosis not present

## 2021-03-10 DIAGNOSIS — Z20822 Contact with and (suspected) exposure to covid-19: Secondary | ICD-10-CM | POA: Diagnosis not present

## 2021-03-10 DIAGNOSIS — R202 Paresthesia of skin: Secondary | ICD-10-CM | POA: Insufficient documentation

## 2021-03-10 DIAGNOSIS — H34212 Partial retinal artery occlusion, left eye: Secondary | ICD-10-CM | POA: Diagnosis not present

## 2021-03-10 DIAGNOSIS — Z79899 Other long term (current) drug therapy: Secondary | ICD-10-CM | POA: Insufficient documentation

## 2021-03-10 DIAGNOSIS — R200 Anesthesia of skin: Secondary | ICD-10-CM | POA: Diagnosis present

## 2021-03-10 DIAGNOSIS — I6523 Occlusion and stenosis of bilateral carotid arteries: Secondary | ICD-10-CM | POA: Diagnosis not present

## 2021-03-10 DIAGNOSIS — H43813 Vitreous degeneration, bilateral: Secondary | ICD-10-CM | POA: Diagnosis not present

## 2021-03-10 DIAGNOSIS — H2513 Age-related nuclear cataract, bilateral: Secondary | ICD-10-CM | POA: Diagnosis not present

## 2021-03-10 DIAGNOSIS — I2510 Atherosclerotic heart disease of native coronary artery without angina pectoris: Secondary | ICD-10-CM | POA: Diagnosis not present

## 2021-03-10 DIAGNOSIS — Z7902 Long term (current) use of antithrombotics/antiplatelets: Secondary | ICD-10-CM | POA: Diagnosis not present

## 2021-03-10 DIAGNOSIS — Z87891 Personal history of nicotine dependence: Secondary | ICD-10-CM | POA: Insufficient documentation

## 2021-03-10 DIAGNOSIS — H34232 Retinal artery branch occlusion, left eye: Secondary | ICD-10-CM | POA: Diagnosis not present

## 2021-03-10 DIAGNOSIS — I10 Essential (primary) hypertension: Secondary | ICD-10-CM | POA: Diagnosis not present

## 2021-03-10 DIAGNOSIS — H18213 Corneal edema secondary to contact lens, bilateral: Secondary | ICD-10-CM | POA: Diagnosis not present

## 2021-03-10 DIAGNOSIS — Z955 Presence of coronary angioplasty implant and graft: Secondary | ICD-10-CM | POA: Insufficient documentation

## 2021-03-10 DIAGNOSIS — R29818 Other symptoms and signs involving the nervous system: Secondary | ICD-10-CM | POA: Diagnosis not present

## 2021-03-10 LAB — CBC WITH DIFFERENTIAL/PLATELET
Abs Immature Granulocytes: 0.01 10*3/uL (ref 0.00–0.07)
Basophils Absolute: 0 10*3/uL (ref 0.0–0.1)
Basophils Relative: 1 %
Eosinophils Absolute: 0.2 10*3/uL (ref 0.0–0.5)
Eosinophils Relative: 3 %
HCT: 40.6 % (ref 39.0–52.0)
Hemoglobin: 13.6 g/dL (ref 13.0–17.0)
Immature Granulocytes: 0 %
Lymphocytes Relative: 26 %
Lymphs Abs: 1.7 10*3/uL (ref 0.7–4.0)
MCH: 29.1 pg (ref 26.0–34.0)
MCHC: 33.5 g/dL (ref 30.0–36.0)
MCV: 86.8 fL (ref 80.0–100.0)
Monocytes Absolute: 0.5 10*3/uL (ref 0.1–1.0)
Monocytes Relative: 8 %
Neutro Abs: 3.9 10*3/uL (ref 1.7–7.7)
Neutrophils Relative %: 62 %
Platelets: 232 10*3/uL (ref 150–400)
RBC: 4.68 MIL/uL (ref 4.22–5.81)
RDW: 14 % (ref 11.5–15.5)
WBC: 6.3 10*3/uL (ref 4.0–10.5)
nRBC: 0 % (ref 0.0–0.2)

## 2021-03-10 LAB — LIPID PANEL
Cholesterol: 154 mg/dL (ref 0–200)
HDL: 48 mg/dL (ref >40)
LDL Cholesterol: 78 mg/dL (ref 0–99)
Total CHOL/HDL Ratio: 3.2 RATIO
Triglycerides: 139 mg/dL (ref <150)
VLDL: 28 mg/dL (ref 0–40)

## 2021-03-10 LAB — COMPREHENSIVE METABOLIC PANEL
ALT: 35 U/L (ref 0–44)
AST: 28 U/L (ref 15–41)
Albumin: 3.9 g/dL (ref 3.5–5.0)
Alkaline Phosphatase: 43 U/L (ref 38–126)
Anion gap: 10 (ref 5–15)
BUN: 16 mg/dL (ref 8–23)
CO2: 24 mmol/L (ref 22–32)
Calcium: 9.2 mg/dL (ref 8.9–10.3)
Chloride: 107 mmol/L (ref 98–111)
Creatinine, Ser: 1.11 mg/dL (ref 0.61–1.24)
GFR, Estimated: >60 mL/min (ref >60)
Glucose, Bld: 87 mg/dL (ref 70–99)
Potassium: 4 mmol/L (ref 3.5–5.1)
Sodium: 141 mmol/L (ref 135–145)
Total Bilirubin: 0.9 mg/dL (ref 0.3–1.2)
Total Protein: 6.8 g/dL (ref 6.5–8.1)

## 2021-03-10 LAB — APTT: aPTT: 28 seconds (ref 24–36)

## 2021-03-10 LAB — RESP PANEL BY RT-PCR (FLU A&B, COVID) ARPGX2
Influenza A by PCR: NEGATIVE
Influenza B by PCR: NEGATIVE
SARS Coronavirus 2 by RT PCR: NEGATIVE

## 2021-03-10 LAB — PROTIME-INR
INR: 1 (ref 0.8–1.2)
Prothrombin Time: 12.9 seconds (ref 11.4–15.2)

## 2021-03-10 LAB — SEDIMENTATION RATE: Sed Rate: 12 mm/hr (ref 0–16)

## 2021-03-10 LAB — C-REACTIVE PROTEIN: CRP: <0.5 mg/dL (ref <1.0)

## 2021-03-10 IMAGING — MR MR HEAD W/O CM
6 of 10 series · 28 of 48 positions shown · non-contrast
Comparison: None.

CLINICAL DATA: Acute neurologic deficit.  Hand paresthesia.

EXAM:
MRI HEAD WITHOUT CONTRAST
TECHNIQUE: Multiplanar, multiecho pulse sequences of the brain and surrounding
structures were obtained without intravenous contrast.

[Series 2: DWI · axial · 3.0mm · 0.94mm/px · z∈[-108,+59]mm · 8 of 113 slices shown (1 of 2)]
[im 1/113]
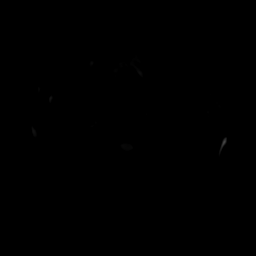
[im 17/113]
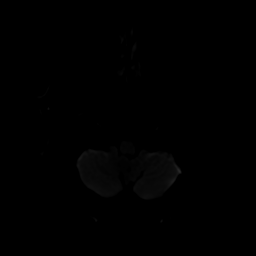
[im 33/113]
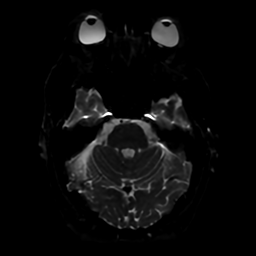
[im 49/113]
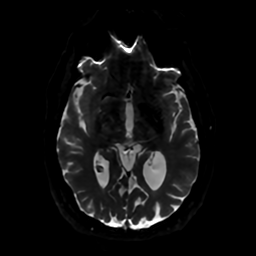
[im 65/113]
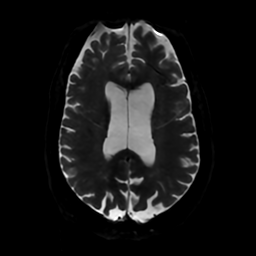
[im 81/113]
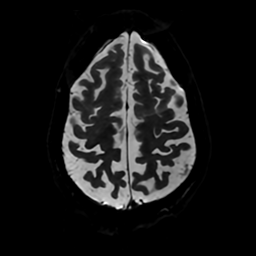
[im 97/113]
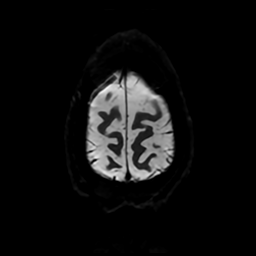
[im 113/113]
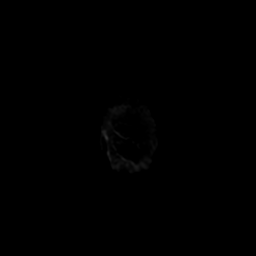

[Series 3: DWI · coronal · 4.0mm · 0.94mm/px · 7 of 80 slices shown (2 of 2)]
[im 1/80]
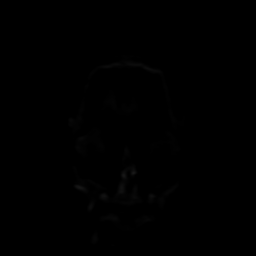
[im 14/80]
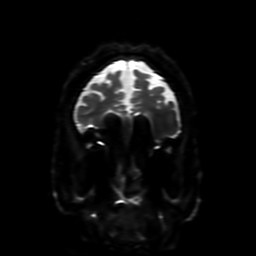
[im 27/80]
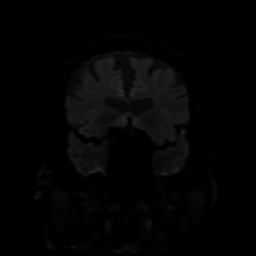
[im 40/80]
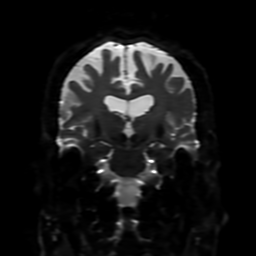
[im 53/80]
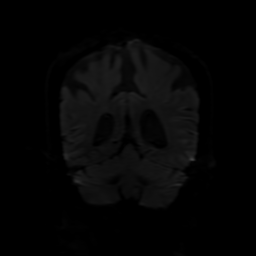
[im 66/80]
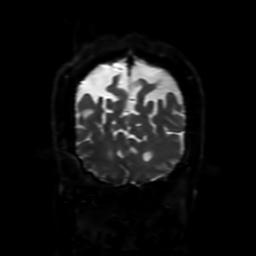
[im 80/80]
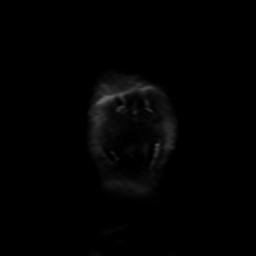

[Series 4: FLAIR · sagittal · 5.0mm · 0.23mm/px · 2 of 25 slices shown (1 of 2)]
[im 1/25]
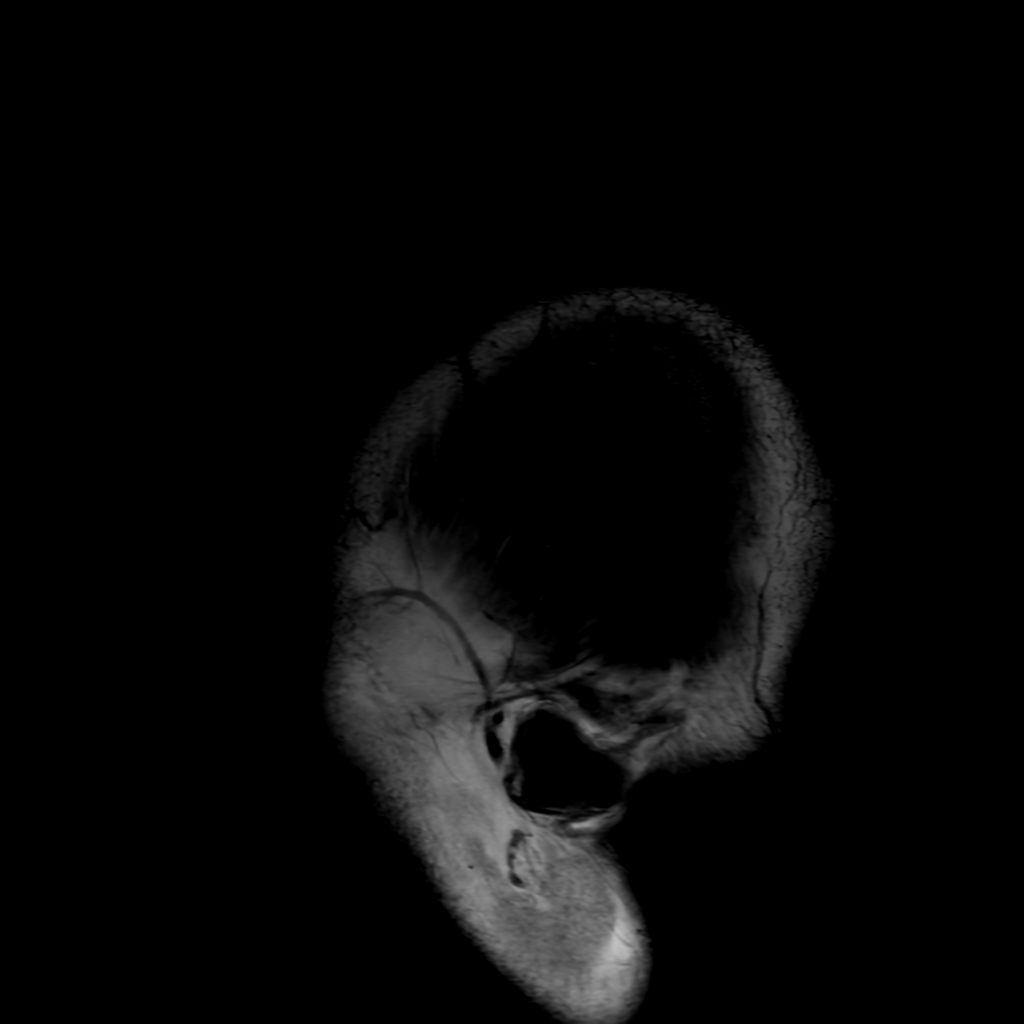
[im 25/25]
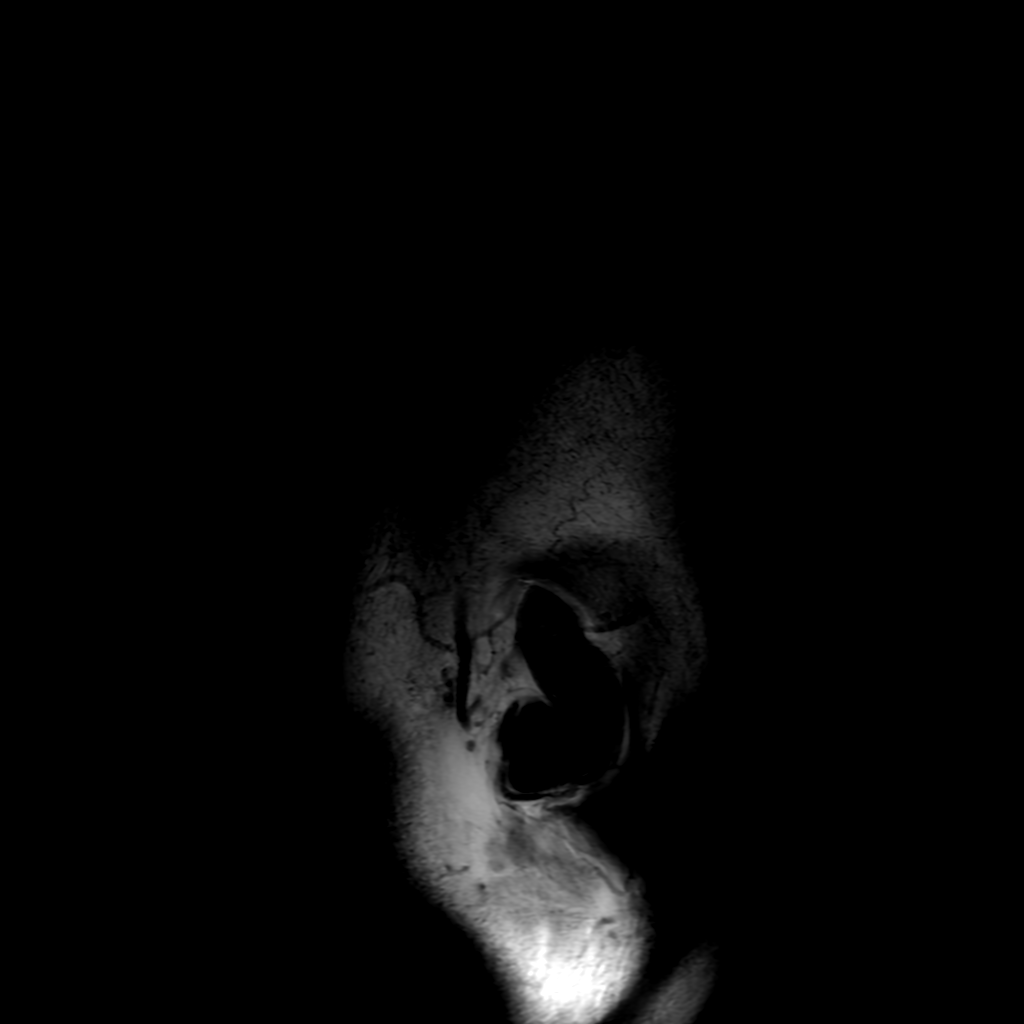

[Series 6: FLAIR · axial · 5.0mm · 0.47mm/px · z∈[-111,+63]mm · 3 of 30 slices shown (2 of 2)]
[im 1/30]
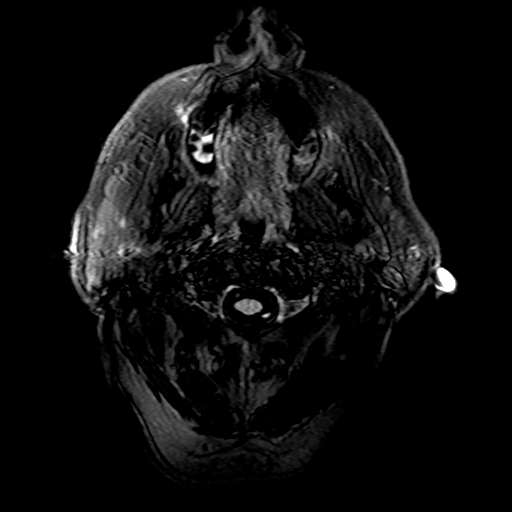
[im 15/30]
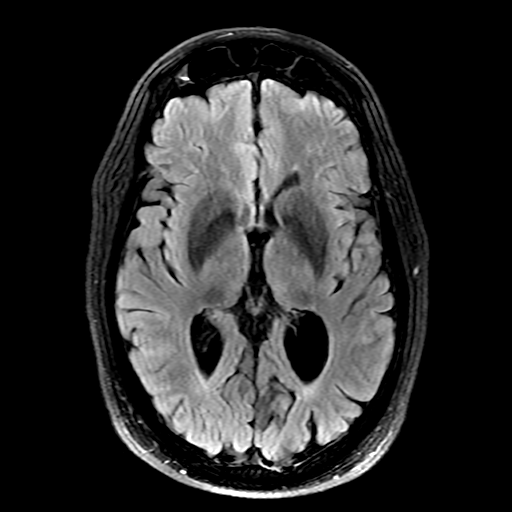
[im 30/30]
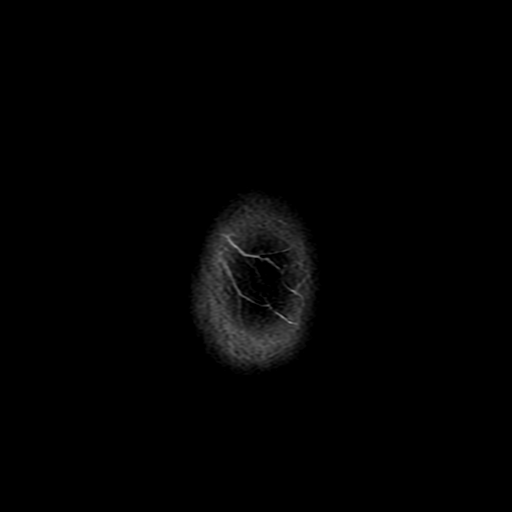

[Series 250: ADC · axial · 3.0mm · 0.94mm/px · z∈[-108,+59]mm · 5 of 57 slices shown (1 of 2)]
[im 1/57]
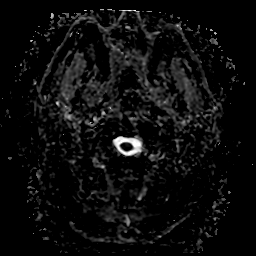
[im 15/57]
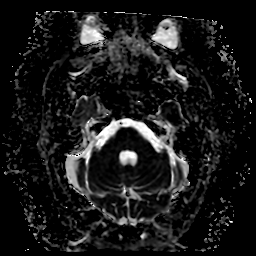
[im 29/57]
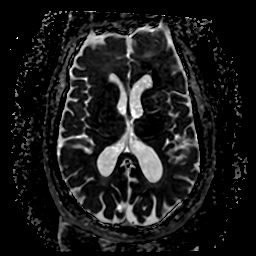
[im 43/57]
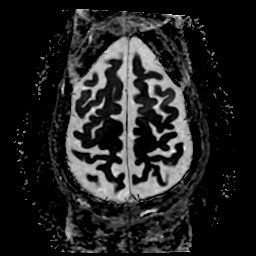
[im 57/57]
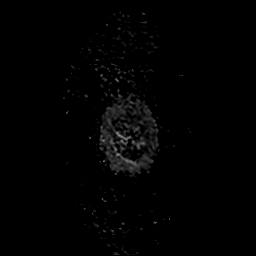

[Series 350: ADC · coronal · 4.0mm · 0.94mm/px · 3 of 40 slices shown (2 of 2)]
[im 1/40]
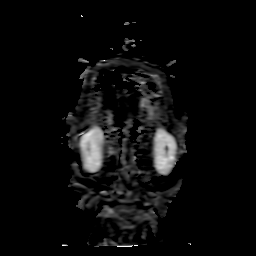
[im 20/40]
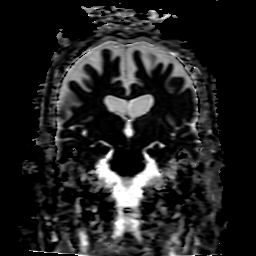
[im 40/40]
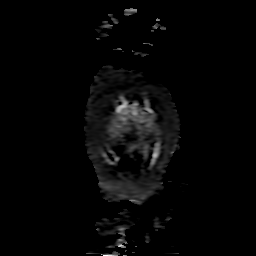

[28 of 48 positions shown; findings below may reference images not displayed]

FINDINGS: Brain: No acute infarct, mass effect or extra-axial collection. No
acute or chronic hemorrhage. There is multifocal hyperintense
T2-weighted signal within the white matter. Generalized volume loss
without a clear lobar predilection. The midline structures are
normal.

Vascular: Major flow voids are preserved.

Skull and upper cervical spine: Normal calvarium and skull base.
Visualized upper cervical spine and soft tissues are normal.

Sinuses/Orbits:No paranasal sinus fluid levels or advanced mucosal
thickening. No mastoid or middle ear effusion. Normal orbits.
IMPRESSION: 1. No acute intracranial abnormality.
2. Findings of chronic small vessel ischemia and generalized volume
loss.

## 2021-03-10 MED ORDER — SODIUM CHLORIDE 0.9 % IV SOLN: INTRAVENOUS | Status: AC

## 2021-03-10 MED ORDER — ROSUVASTATIN CALCIUM 20 MG PO TABS
40.0000 mg | ORAL_TABLET | Freq: Every day | ORAL | Status: DC
  Administered 2021-03-10: 40 mg via ORAL
  Filled 2021-03-10: qty 2

## 2021-03-10 MED ORDER — ACETAMINOPHEN 650 MG RE SUPP: 650.0000 mg | RECTAL | Status: DC | PRN

## 2021-03-10 MED ORDER — ACETAMINOPHEN 325 MG PO TABS
650.0000 mg | ORAL_TABLET | ORAL | Status: DC | PRN
  Administered 2021-03-11: 650 mg via ORAL
  Filled 2021-03-10: qty 2

## 2021-03-10 MED ORDER — METOPROLOL TARTRATE 25 MG PO TABS
12.5000 mg | ORAL_TABLET | Freq: Two times a day (BID) | ORAL | Status: DC
  Administered 2021-03-11: 12.5 mg via ORAL
  Filled 2021-03-10: qty 1

## 2021-03-10 MED ORDER — STROKE: EARLY STAGES OF RECOVERY BOOK
Freq: Once | Status: AC
  Filled 2021-03-10: qty 1

## 2021-03-10 MED ORDER — SENNOSIDES-DOCUSATE SODIUM 8.6-50 MG PO TABS: 1.0000 | ORAL_TABLET | Freq: Every evening | ORAL | Status: DC | PRN

## 2021-03-10 MED ORDER — CLOPIDOGREL BISULFATE 75 MG PO TABS
75.0000 mg | ORAL_TABLET | Freq: Every day | ORAL | Status: DC
  Administered 2021-03-11: 75 mg via ORAL
  Filled 2021-03-10: qty 1

## 2021-03-10 MED ORDER — PANTOPRAZOLE SODIUM 40 MG PO TBEC
40.0000 mg | DELAYED_RELEASE_TABLET | Freq: Every day | ORAL | Status: DC
  Administered 2021-03-11: 40 mg via ORAL
  Filled 2021-03-10: qty 1

## 2021-03-10 MED ORDER — ACETAMINOPHEN 160 MG/5ML PO SOLN: 650.0000 mg | ORAL | Status: DC | PRN

## 2021-03-10 MED ORDER — IOHEXOL 350 MG/ML SOLN
80.0000 mL | Freq: Once | INTRAVENOUS | Status: AC | PRN
  Administered 2021-03-10: 80 mL via INTRAVENOUS

## 2021-03-10 MED ORDER — ENOXAPARIN SODIUM 40 MG/0.4ML IJ SOSY
40.0000 mg | PREFILLED_SYRINGE | INTRAMUSCULAR | Status: DC
  Administered 2021-03-10: 40 mg via SUBCUTANEOUS
  Filled 2021-03-10: qty 0.4

## 2021-03-10 NOTE — Consult Note (Signed)
NEURO HOSPITALIST CONSULT NOTE   Requestig physician: Dr. Vanita Panda  Reason for Consult: Acute onset of scotoma OS  History obtained from:  Patient and Chart     HPI:                                                                                                                                          Shane Wood is an 65 y.o. male with a PMHx of CAD s/p stenting, PVCs and HLD, who presented to the ED from his Ophthalmologist on Friday morning for stroke up after evaluation there revealed findings most consistent with a left branch retinal artery occlusion (BRAO).  The patient had made the appointment with his eye doctor for evaluation of left monocular visual blurring described as associated with a new, very large floater. The visual change started on Tuesday with some gradual improvement since then, but still with significant visual impairment. The vision loss was non-painful. The patient has no prior history of stroke. Home medications include Plavix and rosuvastatin.   Of note, he also endorses bilateral intermittent hand numbness over the long term which comes and goes and is often present on waking from sleep or after leaning on one or both arms, and gets better with shaking the hands out. The symptoms are present in the 4th and 5th digits as well as the medial aspects of the hands, sparing the arms above the wrists.   Additional history per EDP note was reviewed: "Patient reports that since Tuesday, he has had persistent blurred floaters in the left eye described as a wavy appearance.  He denies any head injuries, medications, falls, or preceding illnesses before the symptoms started.  He went to his ophthalmologist today, who reportedly detected a retinal artery occlusion per their note and transferred the patient to this ED for further care.  On arrival, the patient is complaining of a dull anterior headache but states he thinks it is because he has not eaten.  He  denies any fevers or chills, rashes, syncope, dizziness or lightheadedness, nausea, chest pain or palpitations, shortness of breath, weakness, difficulty ambulating, or any other complaints.  He does note that he has had intermittent left hand numbness for many months preceding this incident.  He takes Plavix s/p prior MI.  He wears contacts and has them in on arrival. Reports "3 shots daily" of ETOH use."  Past Medical History:  Diagnosis Date   Allergy    BACK PAIN    ELEVATED BP READING WITHOUT DX HYPERTENSION 05/03/2008   Qualifier: Diagnosis of  By: Larose Kells MD, Alda Berthold.    GERD (gastroesophageal reflux disease)    H/O cardiovascular stress test 2005    (-)   Hyperlipidemia    INSOMNIA-SLEEP DISORDER-UNSPEC    PVC (premature  ventricular contraction)     Past Surgical History:  Procedure Laterality Date   ARTERY REPAIR Right 08/23/2017   Procedure: EXPLORATION RIGHT RADIAL ARTERY,  RIGHT FOREARM FASCIOTOMY;  Surgeon: Elam Dutch, MD;  Location: [facility];  Service: Vascular;  Laterality: Right;   CHOLECYSTECTOMY     CORONARY STENT INTERVENTION N/A 08/23/2017   Procedure: CORONARY STENT INTERVENTION;  Surgeon: Jettie Booze, MD;  Location: [facility];  Service: Cardiovascular;  Laterality: N/A;   LEFT HEART CATH AND CORONARY ANGIOGRAPHY N/A 08/23/2017   Procedure: LEFT HEART CATH AND CORONARY ANGIOGRAPHY;  Surgeon: Jettie Booze, MD;  Location: [facility];  Service: Cardiovascular;  Laterality: N/A;   POLYPECTOMY     TONSILLECTOMY     age 8 y/o    Family History  Problem Relation Age of Onset   Lung cancer Father        smoker   Diabetes Maternal Grandmother    Diabetes Maternal Grandfather    Hypertension Mother    Depression Mother    Obesity Mother    Heart attack Other        uncle MI at age 58s   Stroke Other        GM, in her 2   Colon cancer Neg Hx    Prostate cancer Neg Hx    Colon polyps Neg Hx    Esophageal cancer Neg Hx    Rectal cancer  Neg Hx    Stomach cancer Neg Hx    Pancreatic cancer Neg Hx              Social History:  reports that he has quit smoking. He quit smokeless tobacco use about 52 years ago.  His smokeless tobacco use included chew. He reports current alcohol use of about 4.0 standard drinks per week. He reports that he does not use drugs.  No Known Allergies  HOME MEDICATIONS:                                                                                                                      No current facility-administered medications on file prior to encounter.   Current Outpatient Medications on File Prior to Encounter  Medication Sig Dispense Refill   Ascorbic Acid (VITAMIN C) 1000 MG tablet Take 1,000 mg by mouth every evening.     Cholecalciferol (VITAMIN D) 125 MCG (5000 UT) CAPS Take 1 capsule by mouth in the morning.     clopidogrel (PLAVIX) 75 MG tablet TAKE 1 TABLET BY MOUTH EVERY DAY 90 tablet 3   Cyanocobalamin (VITAMIN B 12 PO) Take 1 tablet by mouth every evening.     ezetimibe (ZETIA) 10 MG tablet TAKE 1 TABLET BY MOUTH EVERY DAY 90 tablet 3   metoprolol tartrate (LOPRESSOR) 25 MG tablet TAKE HALF A TABLET (12.5 MG TOTAL) BY MOUTH 2 (TWO) TIMES DAILY. 90 tablet 3   Multiple Vitamin (MULTIVITAMIN) tablet Take 1 tablet by mouth every evening.  nitroGLYCERIN (NITROSTAT) 0.4 MG SL tablet PLACE 1 TABLET (0.4 MG TOTAL) UNDER THE TONGUE EVERY 5 (FIVE) MINUTES X 3 DOSES AS NEEDED FOR CHEST PAIN. 25 tablet 6   pantoprazole (PROTONIX) 40 MG tablet TAKE 1 TABLET BY MOUTH EVERY DAY 90 tablet 3   rosuvastatin (CRESTOR) 40 MG tablet TAKE 1 TABLET BY MOUTH EVERYDAY AT BEDTIME 90 tablet 3     ROS:                                                                                                                                       Positive for headache. No SOB, CP, N/V, dizziness, neck pain, limb weakness, gait imbalance, confusion, facial droop or aphasia. Other ROS as per HPI.    Blood pressure  (!) 160/89, pulse 81, temperature 98.5 F (36.9 C), temperature source Oral, resp. rate 14, SpO2 99 %.   General Examination:                                                                                                       Physical Exam  HEENT-  [place]/AT  Lungs - Respirations unlabored Extremities- No edema  Neurological Examination Mental Status: Alert, fully oriented, thought content appropriate.  No dysarthria. Speech fluent without evidence of aphasia.  Able to follow all commands without difficulty. Cranial Nerves: II: Visual fields intact in all 4 quadrants of each eye. Scotoma with visual blurring OS in the region of the left upper quadrant not affecting far peripheral vision. PERRL without RAPD noted.  Visual acuity with contacts:   20/30 OD with far-distance contact lens  20/70 OS with short distance contact lens (uses monovision configuration of one near-distance contact and one far-distance contact) III,IV, VI: No ptosis. EOMI. No nystagmus V,VII: Smile symmetric, facial temp sensation equal bilaterally VIII: hearing intact to voice IX,X: No hypophonia XI: Symmetric shoulder shrug XII: Midline tongue extension Motor: Right : Upper extremity   5/5    Left:     Upper extremity   5/5  Lower extremity   5/5     Lower extremity   5/5 No pronator drift Sensory: Temp and light touch intact throughout, bilaterally. No extinction to DSS.  Deep Tendon Reflexes: 2+ and symmetric throughout Cerebellar: No ataxia with FNF bilaterally  Gait: Deferred   Lab Results: Basic Metabolic Panel: Recent Labs  Lab 03/10/21 1707  NA 141  K 4.0  CL 107  CO2 24  GLUCOSE 87  BUN 16  CREATININE 1.11  CALCIUM 9.2    CBC: Recent Labs  Lab 03/10/21 1707  WBC 6.3  NEUTROABS 3.9  HGB 13.6  HCT 40.6  MCV 86.8  PLT 232    Cardiac Enzymes: No results for input(s): CKTOTAL, CKMB, CKMBINDEX, TROPONINI in the last 168 hours.  Lipid Panel: No results for input(s): CHOL, TRIG, HDL,  CHOLHDL, VLDL, LDLCALC in the last 168 hours.  Imaging: CT ANGIO HEAD NECK W WO CM  Result Date: 03/10/2021 CLINICAL DATA:  Small clot in left eye vessel contrast. EXAM: CT ANGIOGRAPHY HEAD AND NECK TECHNIQUE: Multidetector CT imaging of the head and neck was performed using the standard protocol during bolus administration of intravenous contrast. Multiplanar CT image reconstructions and MIPs were obtained to evaluate the vascular anatomy. Carotid stenosis measurements (when applicable) are obtained utilizing NASCET criteria, using the distal internal carotid diameter as the denominator. CONTRAST:  25m OMNIPAQUE IOHEXOL 350 MG/ML SOLN COMPARISON:  None. FINDINGS: CT HEAD FINDINGS Brain: No evidence of acute infarction, hemorrhage, cerebral edema, mass, mass effect, or midline shift. No hydrocephalus or extra-axial fluid collection. Vascular: No hyperdense vessel. Skull: Normal. Negative for fracture or focal lesion. Sinuses/Orbits: No acute finding. Other: The mastoid air cells are well aerated. CTA NECK FINDINGS Aortic arch: Standard branching. Imaged portion shows no evidence of aneurysm or dissection. No significant stenosis of the major arch vessel origins. Aortic atherosclerosis. Right carotid system: No evidence of dissection, stenosis (50% or greater) or occlusion. Left carotid system: No evidence of dissection, stenosis (50% or greater) or occlusion. Vertebral arteries: Codominant. No evidence of dissection, stenosis (50% or greater) or occlusion. Skeleton: No acute osseous abnormality. Other neck: Negative Upper chest: No focal pulmonary opacity. Moderate to severe coronary artery calcifications, incompletely evaluated. Review of the MIP images confirms the above findings CTA HEAD FINDINGS Anterior circulation: Both internal carotid arteries are patent to the termini, but demonstrate moderate calcified narrowing bilaterally. The origin of the bilateral ophthalmic arteries is not well seen, however  contrast is noted opacifying the bilateral ophthalmic arteries proximally A1 segments patent. Normal anterior communicating artery. 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 No M1 stenosis or occlusion. Normal MCA bifurcations. Distal MCA branches perfused and symmetric. Posterior circulation: Vertebral arteries patent to the vertebrobasilar junction without stenosis. Posterior inferior cerebral arteries patent bilaterally. Basilar patent to its distal aspect. Superior cerebellar arteries patent bilaterally. PCAs perfused to their distal aspects without stenosis. The bilateral posterior communicating arteries are patent. Venous sinuses: As permitted by contrast timing, patent. Anatomic variants: As described above. Review of the MIP images confirms the above findings IMPRESSION: 1. No intracranial large vessel occlusion. The origins of the ophthalmic arteries are not well visualized, however there appears to be flow in the proximal intraorbital ophthalmic arteries bilaterally. 2. No hemodynamically significant stenosis in the neck. 3. No acute intracranial process. 4. Aortic Atherosclerosis (ICD10-I70.0) and coronary atherosclerosis. Electronically Signed   By: AMerilyn BabaM.D.   On: 03/10/2021 18:57    Echocardiogram 08/24/17: - Left ventricle: The cavity size was normal. Wall thickness was    increased in a pattern of mild LVH. Systolic function was normal.    The estimated ejection fraction was in the range of 60% to 65%.    Wall motion was normal; there were no regional wall motion    abnormalities. Left ventricular diastolic function parameters    were normal.  - Aortic valve: Mildly calcified annulus.  - Mitral valve: There was mild regurgitation.  - Left atrium: The atrium was mildly to moderately dilated.  -  Right ventricle: Systolic function was mildly reduced.  - Atrial septum: No defect or patent foramen ovale was identified.  - Systemic veins: IVC was normal in caliber but did not collapse    with  respiration.   Assessment: 65 year old male with new onset of left monocular visual blurring in association with a new scotoma. Ophthalmology evaluation prior to presentation to the ED was consistent with a left BRAO.  1. Exam reveals decreased visual acuity OS with a scotoma overlapping predominantly the left upper quadrant of the left eye.   2. CTA of head and neck: No intracranial large vessel occlusion. The origins of the ophthalmic arteries are not well visualized, however there appears to be flow in the proximal intraorbital ophthalmic arteries bilaterally. No hemodynamically significant stenosis in the neck. No acute intracranial process.  Aortic Atherosclerosis  3. MRI brain: No acute intracranial abnormality. Findings of chronic small vessel ischemia and generalized volume loss. 4. Stroke risk factors: CAD and HLD 5. CMP, CRP, ESR and CBS are normal. Lipid panel and coags also normal.  6. EKG:   Sinus rhythm Left atrial enlargement Borderline repolarization abnormality Artifact T wave abnormality. Abnormally increased QT interval of 525.  7. Most likely etiology for patient's presentation is cholesterol embolus. Possible sources include left ICA and aortic arch.   Recommendations: 1. TTE.  2. Continue simvastatin 3. Add ASA to Plavix 4. BP management.  5. OT for vision impairment 6. HgbA1c 7. Telemetry monitoring 8. Frequent neuro checks   Electronically signed: Dr. Kerney Elbe 03/10/2021, 7:28 PM

## 2021-03-10 NOTE — ED Provider Notes (Signed)
[facility] EM[facility] DEPARTMENT Provider Note   CSN: [id number] Arrival date & time: 03/10/21  1125     History Chief Complaint  Patient presents with   retinal artery occlusion    Shane Wood is a 65 y.o. male.  HPI  65 year old male with PMH significant for CAD, PVCs, HLD, and others as below who presents to the ED with complaints of visual disturbance.  Patient reports that since Tuesday, he has had persistent blurred floaters in the left eye described as a wavy appearance.  He denies any head injuries, medications, falls, or preceding illnesses before the symptoms started.  He went to his ophthalmologist today, who reportedly detected a retinal artery occlusion per their note and transferred the patient to this ED for further care.  On arrival, the patient is complaining of a dull anterior headache but states he thinks it is because he has not eaten.  He denies any fevers or chills, rashes, syncope, dizziness or lightheadedness, nausea, chest pain or palpitations, shortness of breath, weakness, difficulty ambulating, or any other complaints.  He does note that he has had intermittent left hand numbness for many months preceding this incident.  He takes Plavix s/p prior MI.  He wears contacts and has them in on arrival. Reports "3 shots daily" of ETOH use.  Past Medical History:  Diagnosis Date   Allergy    BACK PAIN    ELEVATED BP READING WITHOUT DX HYPERTENSION 05/03/2008   Qualifier: Diagnosis of  By: Larose Kells MD, Alda Berthold.    GERD (gastroesophageal reflux disease)    H/O cardiovascular stress test 2005    (-)   Hyperlipidemia    INSOMNIA-SLEEP DISORDER-UNSPEC    PVC (premature ventricular contraction)     Patient Active Problem List   Diagnosis Date Noted   Visual changes 03/10/2021   Numbness and tingling in left hand 03/10/2021   Prolonged QT interval 03/10/2021   Prediabetes 03/12/2019   Vitamin D deficiency 03/12/2019   Class 1 obesity with serious  comorbidity and body mass index (BMI) of 30.0 to 30.9 in adult 03/12/2019   Mixed dyslipidemia, goal LDL below 70 03/12/2019   Coronary artery disease involving native coronary artery of native heart without angina pectoris 03/12/2019   Compartment syndrome of right upper extremity ([place]) 08/29/2017   NSTEMI (non-ST elevated myocardial infarction) ([place]) 08/23/2017   GERD (gastroesophageal reflux disease) 03/06/2017   PCP NOTES >>>>> 03/09/2015   PVC (premature ventricular contraction) 12/18/2013   Health anxiety 12/18/2013   Annual physical exam 06/02/2012    Past Surgical History:  Procedure Laterality Date   ARTERY REPAIR Right 08/23/2017   Procedure: EXPLORATION RIGHT RADIAL ARTERY,  RIGHT FOREARM FASCIOTOMY;  Surgeon: Elam Dutch, MD;  Location: [facility];  Service: Vascular;  Laterality: Right;   CHOLECYSTECTOMY     CORONARY STENT INTERVENTION N/A 08/23/2017   Procedure: CORONARY STENT INTERVENTION;  Surgeon: Jettie Booze, MD;  Location: [facility];  Service: Cardiovascular;  Laterality: N/A;   LEFT HEART CATH AND CORONARY ANGIOGRAPHY N/A 08/23/2017   Procedure: LEFT HEART CATH AND CORONARY ANGIOGRAPHY;  Surgeon: Jettie Booze, MD;  Location: [facility];  Service: Cardiovascular;  Laterality: N/A;   POLYPECTOMY     TONSILLECTOMY     age 40 y/o       Family History  Problem Relation Age of Onset   Lung cancer Father        smoker   Diabetes Maternal Grandmother  Diabetes Maternal Grandfather    Hypertension Mother    Depression Mother    Obesity Mother    Heart attack Other        uncle MI at age 15s   Stroke Other        GM, in her 46   Colon cancer Neg Hx    Prostate cancer Neg Hx    Colon polyps Neg Hx    Esophageal cancer Neg Hx    Rectal cancer Neg Hx    Stomach cancer Neg Hx    Pancreatic cancer Neg Hx     Social History   Tobacco Use   Smoking status: Former   Smokeless tobacco: Former    Types: Chew    Quit date: 1970    Tobacco comments:    1 ppd , quit 1989  Vaping Use   Vaping Use: Never used  Substance Use Topics   Alcohol use: Yes    Alcohol/week: 4.0 standard drinks    Types: 4 Glasses of wine per week    Comment: occasionally on weekends   Drug use: No    Home Medications Prior to Admission medications   Medication Sig Start Date End Date Taking? Authorizing Provider  Ascorbic Acid (VITAMIN C) 1000 MG tablet Take 1,000 mg by mouth every evening.   Yes [provider]  Cholecalciferol (VITAMIN D) 125 MCG (5000 UT) CAPS Take 1 capsule by mouth in the morning. 08/22/19  Yes [provider]  clopidogrel (PLAVIX) 75 MG tablet TAKE 1 TABLET BY MOUTH EVERY DAY 07/26/20  Yes Jettie Booze, MD  Cyanocobalamin (VITAMIN B 12 PO) Take 1 tablet by mouth every evening.   Yes [provider]  ezetimibe (ZETIA) 10 MG tablet TAKE 1 TABLET BY MOUTH EVERY DAY 07/26/20  Yes Jettie Booze, MD  metoprolol tartrate (LOPRESSOR) 25 MG tablet TAKE HALF A TABLET (12.5 MG TOTAL) BY MOUTH 2 (TWO) TIMES DAILY. 07/26/20  Yes Jettie Booze, MD  Multiple Vitamin (MULTIVITAMIN) tablet Take 1 tablet by mouth every evening.   Yes [provider]  nitroGLYCERIN (NITROSTAT) 0.4 MG SL tablet PLACE 1 TABLET (0.4 MG TOTAL) UNDER THE TONGUE EVERY 5 (FIVE) MINUTES X 3 DOSES AS NEEDED FOR CHEST PAIN. 10/18/20  Yes Dunn, Dayna N, PA-C  pantoprazole (PROTONIX) 40 MG tablet TAKE 1 TABLET BY MOUTH EVERY DAY 07/26/20  Yes Jettie Booze, MD  rosuvastatin (CRESTOR) 40 MG tablet TAKE 1 TABLET BY MOUTH EVERYDAY AT BEDTIME 03/09/21  Yes Colon Branch, MD    Allergies    Patient has no known allergies.  Review of Systems   Review of Systems  Constitutional:  Negative for activity change, appetite change, chills and fever.  HENT:  Negative for ear pain, hearing loss, sore throat and voice change.   Eyes:  Positive for visual disturbance. Negative for photophobia, pain, discharge, redness and  itching.  Respiratory:  Negative for cough and shortness of breath.   Cardiovascular:  Negative for chest pain and palpitations.  Gastrointestinal:  Negative for abdominal pain, nausea and vomiting.  Genitourinary:  Negative for dysuria and hematuria.  Musculoskeletal:  Negative for arthralgias, back pain, neck pain and neck stiffness.  Skin:  Negative for color change, pallor and rash.  Allergic/Immunologic: Negative for immunocompromised state.  Neurological:  Positive for numbness and headaches. Negative for dizziness, tremors, seizures, syncope, facial asymmetry, speech difficulty and light-headedness.  Hematological:  Does not bruise/bleed easily.  Psychiatric/Behavioral:  Negative for confusion. The patient  is not nervous/anxious.   All other systems reviewed and are negative.  Physical Exam Updated Vital Signs BP (!) 150/86   Pulse 62   Temp 97.9 F (36.6 C)   Resp 18   Ht _0  (1.803 m)   Wt 100.2 kg   SpO2 99%   BMI 30.82 kg/m   Physical Exam Vitals and nursing note reviewed.  Constitutional:      General: He is not in acute distress.    Appearance: Normal appearance. He is well-developed. He is not ill-appearing or toxic-appearing.  HENT:     Head: Normocephalic and atraumatic.     Right Ear: External ear normal.     Left Ear: External ear normal.     Nose: Nose normal.     Mouth/Throat:     Mouth: Mucous membranes are moist.     Pharynx: Oropharynx is clear.  Eyes:     General: Vision grossly intact. Gaze aligned appropriately. No visual field deficit or scleral icterus.       Right eye: No discharge.        Left eye: No discharge.     Extraocular Movements: Extraocular movements intact.     Conjunctiva/sclera: Conjunctivae normal.     Pupils: Pupils are equal, round, and reactive to light.     Funduscopic exam:    Right eye: Red reflex present.        Left eye: Red reflex present.    Comments: Pupils 79m  Neck:     Vascular: No carotid bruit or JVD.   Cardiovascular:     Rate and Rhythm: Normal rate and regular rhythm.     Pulses: Normal pulses.     Heart sounds: Normal heart sounds. No murmur heard. Pulmonary:     Effort: Pulmonary effort is normal. No respiratory distress.     Breath sounds: Normal breath sounds. No wheezing.  Abdominal:     Palpations: Abdomen is soft.     Tenderness: There is no abdominal tenderness. There is no guarding or rebound.  Musculoskeletal:        General: No swelling. Normal range of motion.     Cervical back: Normal range of motion and neck supple. No rigidity or tenderness.     Right lower leg: No edema.     Left lower leg: No edema.  Lymphadenopathy:     Cervical: No cervical adenopathy.  Skin:    General: Skin is warm and dry.     Capillary Refill: Capillary refill takes less than 2 seconds.     Findings: No rash.  Neurological:     General: No focal deficit present.     Mental Status: He is alert and oriented to person, place, and time. Mental status is at baseline.     GCS: GCS eye subscore is 4. GCS verbal subscore is 5. GCS motor subscore is 6.     Cranial Nerves: Cranial nerves 2-12 are intact. No cranial nerve deficit, dysarthria or facial asymmetry.     Sensory: Sensation is intact.     Motor: Motor function is intact. No weakness, tremor, atrophy, abnormal muscle tone or seizure activity.     Coordination: Coordination is intact. Finger-Nose-Finger Test normal.     Gait: Gait is intact.  Psychiatric:        Mood and Affect: Mood normal.        Behavior: Behavior normal.    ED Results / Procedures / Treatments   Labs (all labs ordered are listed,  but only abnormal results are displayed) Labs Reviewed  RESP PANEL BY RT-PCR (FLU A&B, COVID) ARPGX2  COMPREHENSIVE METABOLIC PANEL  CBC WITH DIFFERENTIAL/PLATELET  APTT  PROTIME-INR  SEDIMENTATION RATE  C-REACTIVE PROTEIN  LIPID PANEL  HIV ANTIBODY (ROUTINE TESTING W REFLEX)  HEMOGLOBIN A1C  CBC  BASIC METABOLIC PANEL   MAGNESIUM    EKG EKG Interpretation  Date/Time:  Friday March 10 2021 15:59:48 EST Ventricular Rate:  79 PR Interval:  156 QRS Duration: 88 QT Interval:  525 QTC Calculation: 602 R Axis:   3 Text Interpretation: Sinus rhythm Left atrial enlargement Borderline repolarization abnormality Artifact T wave abnormality Abnormal ECG Confirmed by Carmin Muskrat [phone]) on 03/10/2021 4:05:55 PM  Radiology CT ANGIO HEAD NECK W WO CM  Result Date: 03/10/2021 CLINICAL DATA:  Small clot in left eye vessel contrast. EXAM: CT ANGIOGRAPHY HEAD AND NECK TECHNIQUE: Multidetector CT imaging of the head and neck was performed using the standard protocol during bolus administration of intravenous contrast. Multiplanar CT image reconstructions and MIPs were obtained to evaluate the vascular anatomy. Carotid stenosis measurements (when applicable) are obtained utilizing NASCET criteria, using the distal internal carotid diameter as the denominator. CONTRAST:  29m OMNIPAQUE IOHEXOL 350 MG/ML SOLN COMPARISON:  None. FINDINGS: CT HEAD FINDINGS Brain: No evidence of acute infarction, hemorrhage, cerebral edema, mass, mass effect, or midline shift. No hydrocephalus or extra-axial fluid collection. Vascular: No hyperdense vessel. Skull: Normal. Negative for fracture or focal lesion. Sinuses/Orbits: No acute finding. Other: The mastoid air cells are well aerated. CTA NECK FINDINGS Aortic arch: Standard branching. Imaged portion shows no evidence of aneurysm or dissection. No significant stenosis of the major arch vessel origins. Aortic atherosclerosis. Right carotid system: No evidence of dissection, stenosis (50% or greater) or occlusion. Left carotid system: No evidence of dissection, stenosis (50% or greater) or occlusion. Vertebral arteries: Codominant. No evidence of dissection, stenosis (50% or greater) or occlusion. Skeleton: No acute osseous abnormality. Other neck: Negative Upper chest: No focal pulmonary  opacity. Moderate to severe coronary artery calcifications, incompletely evaluated. Review of the MIP images confirms the above findings CTA HEAD FINDINGS Anterior circulation: Both internal carotid arteries are patent to the termini, but demonstrate moderate calcified narrowing bilaterally. The origin of the bilateral ophthalmic arteries is not well seen, however contrast is noted opacifying the bilateral ophthalmic arteries proximally A1 segments patent. Normal anterior communicating artery. 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 No M1 stenosis or occlusion. Normal MCA bifurcations. Distal MCA branches perfused and symmetric. Posterior circulation: Vertebral arteries patent to the vertebrobasilar junction without stenosis. Posterior inferior cerebral arteries patent bilaterally. Basilar patent to its distal aspect. Superior cerebellar arteries patent bilaterally. PCAs perfused to their distal aspects without stenosis. The bilateral posterior communicating arteries are patent. Venous sinuses: As permitted by contrast timing, patent. Anatomic variants: As described above. Review of the MIP images confirms the above findings IMPRESSION: 1. No intracranial large vessel occlusion. The origins of the ophthalmic arteries are not well visualized, however there appears to be flow in the proximal intraorbital ophthalmic arteries bilaterally. 2. No hemodynamically significant stenosis in the neck. 3. No acute intracranial process. 4. Aortic Atherosclerosis (ICD10-I70.0) and coronary atherosclerosis. Electronically Signed   By: AMerilyn BabaM.D.   On: 03/10/2021 18:57   MR BRAIN WO CONTRAST  Result Date: 03/10/2021 CLINICAL DATA:  Acute neurologic deficit.  Hand paresthesia. EXAM: MRI HEAD WITHOUT CONTRAST TECHNIQUE: Multiplanar, multiecho pulse sequences of the brain and surrounding structures were obtained without intravenous contrast. COMPARISON:  None. FINDINGS: Brain:  No acute infarct, mass effect or extra-axial collection. No  acute or chronic hemorrhage. There is multifocal hyperintense T2-weighted signal within the white matter. Generalized volume loss without a clear lobar predilection. The midline structures are normal. Vascular: Major flow voids are preserved. Skull and upper cervical spine: Normal calvarium and skull base. Visualized upper cervical spine and soft tissues are normal. Sinuses/Orbits:No paranasal sinus fluid levels or advanced mucosal thickening. No mastoid or middle ear effusion. Normal orbits. IMPRESSION: 1. No acute intracranial abnormality. 2. Findings of chronic small vessel ischemia and generalized volume loss. Electronically Signed   By: Ulyses Jarred M.D.   On: 03/10/2021 22:50    Procedures Procedures   Medications Ordered in ED Medications  metoprolol tartrate (LOPRESSOR) tablet 12.5 mg (has no administration in time range)  rosuvastatin (CRESTOR) tablet 40 mg (40 mg Oral Given 03/10/21 2119)  pantoprazole (PROTONIX) EC tablet 40 mg (has no administration in time range)  clopidogrel (PLAVIX) tablet 75 mg (has no administration in time range)  0.9 %  sodium chloride infusion ( Intravenous New Bag/Given 03/10/21 2107)  acetaminophen (TYLENOL) tablet 650 mg (has no administration in time range)    Or  acetaminophen (TYLENOL) 160 MG/5ML solution 650 mg (has no administration in time range)    Or  acetaminophen (TYLENOL) suppository 650 mg (has no administration in time range)  senna-docusate (Senokot-S) tablet 1 tablet (has no administration in time range)  enoxaparin (LOVENOX) injection 40 mg (40 mg Subcutaneous Given 03/10/21 2108)  iohexol (OMNIPAQUE) 350 MG/ML injection 80 mL (80 mLs Intravenous Contrast Given 03/10/21 1810)   stroke: mapping our early stages of recovery book ( Does not apply Given 03/10/21 2107)    ED Course  I have reviewed the triage vital signs and the nursing notes.  Pertinent labs & imaging results that were available during my care of the patient were reviewed  by me and considered in my medical decision making (see chart for details).    MDM Rules/Calculators/A&P                          KIYON FIDALGO is a 65 y.o. male presenting with left eye visual changes. Initial VS sig for HTN.  EKG interpretation: NSR, rate 79 bpm, prolonged QTC.  No ST elevations or depressions, repolarization abnormality in V1 and V2, nonspecific T wave changes.  No significant change from prior, Qtc has prolonged.  Labs: COVID/flu negative.  CBC, CMP, CRP, ESR, lipid panel, and coags WNL.   Imaging: CTA negative for LVO.  MRI brain without acute intracranial abnormality, notable for chronic ischemia.  Bedside ultrasound revealed optic nerve sheath diameter within normal limits and no signs of retinal detachment.  Mild hyperechoic debris in posterior vitreous chamber.  Imaging reviewed by radiology and personally by me.  DDX considered: uveitis, iritis, conjunctivitis, keratitis, HSV, corneal ulcer or abrasion, globe rupture, glaucoma, GCA, CRAO, retinal detachment, trauma, blepharitis, CVA, hemorrhagic stroke, dissection. History, exam, and objective data most consistent with BRAO. No systemic infectious signs or symptoms.  No sign of detachment on bedside ultrasound.  Imaging negative for acute neurologic process at this time.  No sign of ocular trauma, irritation, inflammation, or infection.  Neurologic exam nonfocal.  No sign of head trauma.  No temporal tenderness or visual changes concerning for GCA.  Medications: Medications  metoprolol tartrate (LOPRESSOR) tablet 12.5 mg (has no administration in time range)  rosuvastatin (CRESTOR) tablet 40 mg (40 mg Oral Given 03/10/21 2119)  pantoprazole (PROTONIX) EC tablet 40 mg (has no administration in time range)  clopidogrel (PLAVIX) tablet 75 mg (has no administration in time range)  0.9 %  sodium chloride infusion ( Intravenous New Bag/Given 03/10/21 2107)  acetaminophen (TYLENOL) tablet 650 mg (has no administration in  time range)    Or  acetaminophen (TYLENOL) 160 MG/5ML solution 650 mg (has no administration in time range)    Or  acetaminophen (TYLENOL) suppository 650 mg (has no administration in time range)  senna-docusate (Senokot-S) tablet 1 tablet (has no administration in time range)  enoxaparin (LOVENOX) injection 40 mg (40 mg Subcutaneous Given 03/10/21 2108)  iohexol (OMNIPAQUE) 350 MG/ML injection 80 mL (80 mLs Intravenous Contrast Given 03/10/21 1810)   stroke: mapping our early stages of recovery book ( Does not apply Given 03/10/21 2107)    Neurology consulted.  Re-evaluated prior to admission.  Neurologic exam unchanged. Hemodynamically stable and in no acute distress.  Admission to hospitalist in stable condition for further work-up, echocardiogram pending.  Follow-up pending neurology recommendations.  Patient understands and agrees with the plan.  Family updated at the bedside.   The plan for this patient was discussed with my attending physician, who voiced agreement and who oversaw evaluation and treatment of this patient.     Note: Estate manager/land agent was used in the creation of this note.   Final Clinical Impression(s) / ED Diagnoses Final diagnoses:  Visual disturbance of one eye    Rx / DC Orders ED Discharge Orders     None        Cherly Hensen, DO 03/10/21 2359    Carmin Muskrat, MD 03/12/21 2329

## 2021-03-10 NOTE — ED Notes (Signed)
ED Provider at bedside. 

## 2021-03-10 NOTE — ED Triage Notes (Signed)
Patient sent by dr. Lequita Halt by eye dr this morning, concern for renal artery occlusion. Sent for stroke work up.

## 2021-03-10 NOTE — ED Provider Notes (Signed)
Emergency Medicine Provider Triage Evaluation Note  Shane Wood , a 65 y.o. male  was evaluated in triage.  Pt complains of visual disturbance to left eye.  Patient reports that yesterday he started having a "large floater," to the vision in his left eye.  Patient reports that since then it looks like he has been looking through "wavy glass."  Patient went to his ophthalmologist earlier this morning who was concerned for retinal artery occlusion.  Patient reports that he was sent here for stroke work-up.  Patient reports that he has tingling station to for the fifth digit to left hand.  This has been present over the last week.  Patient denies any other numbness.  Patient denies any dysarthria, visual disturbance, neck pain, headache.  Review of Systems  Positive: Visual disturbance, tingling sensation  Negative: Weakness, numbness, dysarthria, neck pain  Physical Exam  BP (!) 162/90 (BP Location: Left Arm)   Pulse 75   Temp 98.5 F (36.9 C) (Oral)   Resp 16   SpO2 99%  Gen:   Awake, no distress   Resp:  Normal effort  MSK:   Moves extremities without difficulty  Other:  +5 strength to bilateral upper and lower extremity.  No facial asymmetry or dysarthria.  Pupils are dilated bilaterally  Medical Decision Making  Medically screening exam initiated at 1:10 PM.  Appropriate orders placed.  Lona Kettle Hollick was informed that the remainder of the evaluation will be completed by another provider, this initial triage assessment does not replace that evaluation, and the importance of remaining in the ED until their evaluation is complete.     Loni Beckwith, PA-C 03/10/21 1312    Hayden Rasmussen, MD 03/10/21 Virl Cagey

## 2021-03-10 NOTE — H&P (View-Only) (Signed)
History and Physical    RAIQUAN CHANDLER JQB:[id number] DOB: 1956-01-27 DOA: 03/10/2021  PCP: Colon Branch, MD   Patient coming from: Home   Chief Complaint: Left eye visual disturbance   HPI: PRAJWAL FELLNER is a pleasant 65 y.o. male with medical history significant for coronary artery disease with NSTEMI in 2019, now presenting to the emergency department with vision disturbance involving the left eye.  Patient reports that he had been in his usual state of health on 03/07/2021 when he had acute onset of visual disturbance involving the left eye, described as "a big piece of lint."  This has improved, but there has been persistent distortion in the right visual field that resolves when he closes the left eye.  There is no associated pain.  He has had intermittent paresthesias involving the bilateral hands that has been more longstanding and worse at night.  He denies any other numbness and denies any weakness.  Denies any difficulty with speech or swallowing.  No recent fevers, chills, chest pain, cough, shortness of breath, or trauma.  He was seen by ophthalmology in the clinic today for his complaints, had findings suggestive of branch retinal artery occlusion, and he was directed to the ED for further evaluation.  ED Course: Upon arrival to the ED, patient is found to be afebrile, saturating well on room air, and with stable blood pressure.  EKG features sinus rhythm with QTC 602 ms.  Head CT negative for acute intracranial abnormality and no LVO found on CTA head and neck.  Basic blood work including CMP and CBC are unremarkable.  Lipid panel was normal.  CRP is undetectable and ESR 12.  Neurology was consulted by the ED physician and hospitalists asked to admit.  Review of Systems:  All other systems reviewed and apart from HPI, are negative.  Past Medical History:  Diagnosis Date   Allergy    BACK PAIN    ELEVATED BP READING WITHOUT DX HYPERTENSION 05/03/2008   Qualifier: Diagnosis of   By: Larose Kells MD, Alda Berthold.    GERD (gastroesophageal reflux disease)    H/O cardiovascular stress test 2005    (-)   Hyperlipidemia    INSOMNIA-SLEEP DISORDER-UNSPEC    PVC (premature ventricular contraction)     Past Surgical History:  Procedure Laterality Date   ARTERY REPAIR Right 08/23/2017   Procedure: EXPLORATION RIGHT RADIAL ARTERY,  RIGHT FOREARM FASCIOTOMY;  Surgeon: Elam Dutch, MD;  Location: [facility];  Service: Vascular;  Laterality: Right;   CHOLECYSTECTOMY     CORONARY STENT INTERVENTION N/A 08/23/2017   Procedure: CORONARY STENT INTERVENTION;  Surgeon: Jettie Booze, MD;  Location: [facility];  Service: Cardiovascular;  Laterality: N/A;   LEFT HEART CATH AND CORONARY ANGIOGRAPHY N/A 08/23/2017   Procedure: LEFT HEART CATH AND CORONARY ANGIOGRAPHY;  Surgeon: Jettie Booze, MD;  Location: [facility];  Service: Cardiovascular;  Laterality: N/A;   POLYPECTOMY     TONSILLECTOMY     age 69 y/o    Social History:   reports that he has quit smoking. He quit smokeless tobacco use about 52 years ago.  His smokeless tobacco use included chew. He reports current alcohol use of about 4.0 standard drinks per week. He reports that he does not use drugs.  No Known Allergies  Family History  Problem Relation Age of Onset   Lung cancer Father        smoker   Diabetes Maternal Grandmother  Diabetes Maternal Grandfather    Hypertension Mother    Depression Mother    Obesity Mother    Heart attack Other        uncle MI at age 96s   Stroke Other        GM, in her 67   Colon cancer Neg Hx    Prostate cancer Neg Hx    Colon polyps Neg Hx    Esophageal cancer Neg Hx    Rectal cancer Neg Hx    Stomach cancer Neg Hx    Pancreatic cancer Neg Hx      Prior to Admission medications   Medication Sig Start Date End Date Taking? Authorizing Provider  Ascorbic Acid (VITAMIN C) 1000 MG tablet Take 1,000 mg by mouth every evening.   Yes [provider]   Cholecalciferol (VITAMIN D) 125 MCG (5000 UT) CAPS Take 1 capsule by mouth in the morning. 08/22/19  Yes [provider]  clopidogrel (PLAVIX) 75 MG tablet TAKE 1 TABLET BY MOUTH EVERY DAY 07/26/20  Yes Jettie Booze, MD  Cyanocobalamin (VITAMIN B 12 PO) Take 1 tablet by mouth every evening.   Yes [provider]  ezetimibe (ZETIA) 10 MG tablet TAKE 1 TABLET BY MOUTH EVERY DAY 07/26/20  Yes Jettie Booze, MD  metoprolol tartrate (LOPRESSOR) 25 MG tablet TAKE HALF A TABLET (12.5 MG TOTAL) BY MOUTH 2 (TWO) TIMES DAILY. 07/26/20  Yes Jettie Booze, MD  Multiple Vitamin (MULTIVITAMIN) tablet Take 1 tablet by mouth every evening.   Yes [provider]  nitroGLYCERIN (NITROSTAT) 0.4 MG SL tablet PLACE 1 TABLET (0.4 MG TOTAL) UNDER THE TONGUE EVERY 5 (FIVE) MINUTES X 3 DOSES AS NEEDED FOR CHEST PAIN. 10/18/20  Yes Dunn, Dayna N, PA-C  pantoprazole (PROTONIX) 40 MG tablet TAKE 1 TABLET BY MOUTH EVERY DAY 07/26/20  Yes Jettie Booze, MD  rosuvastatin (CRESTOR) 40 MG tablet TAKE 1 TABLET BY MOUTH EVERYDAY AT BEDTIME 03/09/21  Yes Colon Branch, MD    Physical Exam: Vitals:   03/10/21 1239 03/10/21 1421 03/10/21 1600 03/10/21 1748  BP: (!) 162/90 (!) 163/96 (!) 161/89 (!) 160/89  Pulse: 75 79 80 81  Resp: _0 Temp: 98.5 F (36.9 C)     TempSrc: Oral     SpO2: 99% 99% 100% 99%    Constitutional: NAD, calm  Eyes: PERTLA, lids and conjunctivae normal ENMT: Mucous membranes are moist. Posterior pharynx clear of any exudate or lesions.   Neck: supple, no masses  Respiratory:  no wheezing, no crackles. No accessory muscle use.  Cardiovascular: S1 & S2 heard, regular rate and rhythm. No extremity edema.  Abdomen: No distension, no tenderness, soft. Bowel sounds active.  Musculoskeletal: no clubbing / cyanosis. No joint deformity upper and lower extremities.   Skin: no significant rashes, lesions, ulcers. Warm, dry, well-perfused. Neurologic: CN 2-12  grossly intact. Sensation intact, DTR normal. Strength 5/5 in all 4 limbs. Alert and oriented.  Psychiatric: Very pleasant. Cooperative.    Labs and Imaging on Admission: I have personally reviewed following labs and imaging studies  CBC: Recent Labs  Lab 03/10/21 1707  WBC 6.3  NEUTROABS 3.9  HGB 13.6  HCT 40.6  MCV 86.8  PLT 332   Basic Metabolic Panel: Recent Labs  Lab 03/10/21 1707  NA 141  K 4.0  CL 107  CO2 24  GLUCOSE 87  BUN 16  CREATININE 1.11  CALCIUM 9.2   GFR: CrCl cannot  be calculated (Unknown ideal weight.). Liver Function Tests: Recent Labs  Lab 03/10/21 1707  AST 28  ALT 35  ALKPHOS 43  BILITOT 0.9  PROT 6.8  ALBUMIN 3.9   No results for input(s): LIPASE, AMYLASE in the last 168 hours. No results for input(s): AMMONIA in the last 168 hours. Coagulation Profile: Recent Labs  Lab 03/10/21 1707  INR 1.0   Cardiac Enzymes: No results for input(s): CKTOTAL, CKMB, CKMBINDEX, TROPONINI in the last 168 hours. BNP (last 3 results) No results for input(s): PROBNP in the last 8760 hours. HbA1C: No results for input(s): HGBA1C in the last 72 hours. CBG: No results for input(s): GLUCAP in the last 168 hours. Lipid Profile: Recent Labs    03/10/21 1707  CHOL 154  HDL 48  LDLCALC 78  TRIG 139  CHOLHDL 3.2   Thyroid Function Tests: No results for input(s): TSH, T4TOTAL, FREET4, T3FREE, THYROIDAB in the last 72 hours. Anemia Panel: No results for input(s): VITAMINB12, FOLATE, FERRITIN, TIBC, IRON, RETICCTPCT in the last 72 hours. Urine analysis: No results found for: COLORURINE, APPEARANCEUR, LABSPEC, PHURINE, GLUCOSEU, HGBUR, BILIRUBINUR, KETONESUR, PROTEINUR, UROBILINOGEN, NITRITE, LEUKOCYTESUR Sepsis Labs: _0 (procalcitonin:4,lacticidven:4) )No results found for this or any previous visit (from the past 240 hour(s)).   Radiological Exams on Admission: CT ANGIO HEAD NECK W WO CM  Result Date: 03/10/2021 CLINICAL DATA:  Small  clot in left eye vessel contrast. EXAM: CT ANGIOGRAPHY HEAD AND NECK TECHNIQUE: Multidetector CT imaging of the head and neck was performed using the standard protocol during bolus administration of intravenous contrast. Multiplanar CT image reconstructions and MIPs were obtained to evaluate the vascular anatomy. Carotid stenosis measurements (when applicable) are obtained utilizing NASCET criteria, using the distal internal carotid diameter as the denominator. CONTRAST:  74m OMNIPAQUE IOHEXOL 350 MG/ML SOLN COMPARISON:  None. FINDINGS: CT HEAD FINDINGS Brain: No evidence of acute infarction, hemorrhage, cerebral edema, mass, mass effect, or midline shift. No hydrocephalus or extra-axial fluid collection. Vascular: No hyperdense vessel. Skull: Normal. Negative for fracture or focal lesion. Sinuses/Orbits: No acute finding. Other: The mastoid air cells are well aerated. CTA NECK FINDINGS Aortic arch: Standard branching. Imaged portion shows no evidence of aneurysm or dissection. No significant stenosis of the major arch vessel origins. Aortic atherosclerosis. Right carotid system: No evidence of dissection, stenosis (50% or greater) or occlusion. Left carotid system: No evidence of dissection, stenosis (50% or greater) or occlusion. Vertebral arteries: Codominant. No evidence of dissection, stenosis (50% or greater) or occlusion. Skeleton: No acute osseous abnormality. Other neck: Negative Upper chest: No focal pulmonary opacity. Moderate to severe coronary artery calcifications, incompletely evaluated. Review of the MIP images confirms the above findings CTA HEAD FINDINGS Anterior circulation: Both internal carotid arteries are patent to the termini, but demonstrate moderate calcified narrowing bilaterally. The origin of the bilateral ophthalmic arteries is not well seen, however contrast is noted opacifying the bilateral ophthalmic arteries proximally A1 segments patent. Normal anterior communicating artery.  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 No M1 stenosis or occlusion. Normal MCA bifurcations. Distal MCA branches perfused and symmetric. Posterior circulation: Vertebral arteries patent to the vertebrobasilar junction without stenosis. Posterior inferior cerebral arteries patent bilaterally. Basilar patent to its distal aspect. Superior cerebellar arteries patent bilaterally. PCAs perfused to their distal aspects without stenosis. The bilateral posterior communicating arteries are patent. Venous sinuses: As permitted by contrast timing, patent. Anatomic variants: As described above. Review of the MIP images confirms the above findings IMPRESSION: 1. No intracranial large vessel occlusion. The origins of  the ophthalmic arteries are not well visualized, however there appears to be flow in the proximal intraorbital ophthalmic arteries bilaterally. 2. No hemodynamically significant stenosis in the neck. 3. No acute intracranial process. 4. Aortic Atherosclerosis (ICD10-I70.0) and coronary atherosclerosis. Electronically Signed   By: Merilyn Baba M.D.   On: 03/10/2021 18:57    EKG: Independently reviewed. Sinus rhythm, LAE, QTc 602 ms.   Assessment/Plan  1. BRAO, left; b/l hand paraesthesia  - Presents from ophthalmology clinic for further eval of left BRAO after being seen there for 4 days of monocular visual deficit  - He also has had intermittent hand numbness bilaterally that is worse at night and likely peripheral  - No acute findings on head CT, no carotid etiology identified on CTA, and ESR and CRP are normal in ED  - Plan to check MRI brain, echocardiogram, and continue cardiac monitoring    2. CAD  - No anginal complaints  - Continue Crestor, Plavix, metoprolol   3. Prolonged QT interval  - QTc is 602 ms on EKG in ED  - Continue cardiac monitoring, check magnesium level, avoid QT-prolonging medications    DVT prophylaxis: Lovenox  Code Status: Full  Level of Care:  Level of care: Telemetry  Medical Family Communication: none present  Disposition Plan:  Patient is from: home  Anticipated d/c is to: Home  Anticipated d/c date is: 11/19 or 03/12/21 Patient currently: Pending MRI brain, echo, cardiac monitoring  Consults called: Neurology  Admission status: Observation     Vianne Bulls, MD [facility]  03/10/2021, 8:17 PM

## 2021-03-10 NOTE — ED Notes (Signed)
IV team unable to obtain blood for labs, Lab will attempt to draw.

## 2021-03-10 NOTE — H&P (Addendum)
History and Physical    Shane Wood MRN:[id number] DOB: 01/08/1956 DOA: 03/10/2021  PCP: Paz, Jose E, MD   Patient coming from: Home   Chief Complaint: Left eye visual disturbance   HPI: Shane Wood is a pleasant 65 y.o. male with medical history significant for coronary artery disease with NSTEMI in 2019, now presenting to the emergency department with vision disturbance involving the left eye.  Patient reports that he had been in his usual state of health on 03/07/2021 when he had acute onset of visual disturbance involving the left eye, described as "a big piece of lint."  This has improved, but there has been persistent distortion in the right visual field that resolves when he closes the left eye.  There is no associated pain.  He has had intermittent paresthesias involving the bilateral hands that has been more longstanding and worse at night.  He denies any other numbness and denies any weakness.  Denies any difficulty with speech or swallowing.  No recent fevers, chills, chest pain, cough, shortness of breath, or trauma.  He was seen by ophthalmology in the clinic today for his complaints, had findings suggestive of branch retinal artery occlusion, and he was directed to the ED for further evaluation.  ED Course: Upon arrival to the ED, patient is found to be afebrile, saturating well on room air, and with stable blood pressure.  EKG features sinus rhythm with QTC 602 ms.  Head CT negative for acute intracranial abnormality and no LVO found on CTA head and neck.  Basic blood work including CMP and CBC are unremarkable.  Lipid panel was normal.  CRP is undetectable and ESR 12.  Neurology was consulted by the ED physician and hospitalists asked to admit.  Review of Systems:  All other systems reviewed and apart from HPI, are negative.  Past Medical History:  Diagnosis Date   Allergy    BACK PAIN    ELEVATED BP READING WITHOUT DX HYPERTENSION 05/03/2008   Qualifier: Diagnosis of   By: Paz MD, Jose E.    GERD (gastroesophageal reflux disease)    H/O cardiovascular stress test 2005    (-)   Hyperlipidemia    INSOMNIA-SLEEP DISORDER-UNSPEC    PVC (premature ventricular contraction)     Past Surgical History:  Procedure Laterality Date   ARTERY REPAIR Right 08/23/2017   Procedure: EXPLORATION RIGHT RADIAL ARTERY,  RIGHT FOREARM FASCIOTOMY;  Surgeon: Fields, Charles E, MD;  Location: [facility];  Service: Vascular;  Laterality: Right;   CHOLECYSTECTOMY     CORONARY STENT INTERVENTION N/A 08/23/2017   Procedure: CORONARY STENT INTERVENTION;  Surgeon: Varanasi, Jayadeep S, MD;  Location: [facility];  Service: Cardiovascular;  Laterality: N/A;   LEFT HEART CATH AND CORONARY ANGIOGRAPHY N/A 08/23/2017   Procedure: LEFT HEART CATH AND CORONARY ANGIOGRAPHY;  Surgeon: Varanasi, Jayadeep S, MD;  Location: [facility];  Service: Cardiovascular;  Laterality: N/A;   POLYPECTOMY     TONSILLECTOMY     age 5 y/o    Social History:   reports that he has quit smoking. He quit smokeless tobacco use about 52 years ago.  His smokeless tobacco use included chew. He reports current alcohol use of about 4.0 standard drinks per week. He reports that he does not use drugs.  No Known Allergies  Family History  Problem Relation Age of Onset   Lung cancer Father        smoker   Diabetes Maternal Grandmother      Diabetes Maternal Grandfather    Hypertension Mother    Depression Mother    Obesity Mother    Heart attack Other        uncle MI at age 34s   Stroke Other        GM, in her 60   Colon cancer Neg Hx    Prostate cancer Neg Hx    Colon polyps Neg Hx    Esophageal cancer Neg Hx    Rectal cancer Neg Hx    Stomach cancer Neg Hx    Pancreatic cancer Neg Hx      Prior to Admission medications   Medication Sig Start Date End Date Taking? Authorizing Provider  Ascorbic Acid (VITAMIN C) 1000 MG tablet Take 1,000 mg by mouth every evening.   Yes [provider]   Cholecalciferol (VITAMIN D) 125 MCG (5000 UT) CAPS Take 1 capsule by mouth in the morning. 08/22/19  Yes [provider]  clopidogrel (PLAVIX) 75 MG tablet TAKE 1 TABLET BY MOUTH EVERY DAY 07/26/20  Yes Jettie Booze, MD  Cyanocobalamin (VITAMIN B 12 PO) Take 1 tablet by mouth every evening.   Yes [provider]  ezetimibe (ZETIA) 10 MG tablet TAKE 1 TABLET BY MOUTH EVERY DAY 07/26/20  Yes Jettie Booze, MD  metoprolol tartrate (LOPRESSOR) 25 MG tablet TAKE HALF A TABLET (12.5 MG TOTAL) BY MOUTH 2 (TWO) TIMES DAILY. 07/26/20  Yes Jettie Booze, MD  Multiple Vitamin (MULTIVITAMIN) tablet Take 1 tablet by mouth every evening.   Yes [provider]  nitroGLYCERIN (NITROSTAT) 0.4 MG SL tablet PLACE 1 TABLET (0.4 MG TOTAL) UNDER THE TONGUE EVERY 5 (FIVE) MINUTES X 3 DOSES AS NEEDED FOR CHEST PAIN. 10/18/20  Yes Dunn, Dayna N, PA-C  pantoprazole (PROTONIX) 40 MG tablet TAKE 1 TABLET BY MOUTH EVERY DAY 07/26/20  Yes Jettie Booze, MD  rosuvastatin (CRESTOR) 40 MG tablet TAKE 1 TABLET BY MOUTH EVERYDAY AT BEDTIME 03/09/21  Yes Colon Branch, MD    Physical Exam: Vitals:   03/10/21 1239 03/10/21 1421 03/10/21 1600 03/10/21 1748  BP: (!) 162/90 (!) 163/96 (!) 161/89 (!) 160/89  Pulse: 75 79 80 81  Resp: _0 Temp: 98.5 F (36.9 C)     TempSrc: Oral     SpO2: 99% 99% 100% 99%    Constitutional: NAD, calm  Eyes: PERTLA, lids and conjunctivae normal ENMT: Mucous membranes are moist. Posterior pharynx clear of any exudate or lesions.   Neck: supple, no masses  Respiratory:  no wheezing, no crackles. No accessory muscle use.  Cardiovascular: S1 & S2 heard, regular rate and rhythm. No extremity edema.  Abdomen: No distension, no tenderness, soft. Bowel sounds active.  Musculoskeletal: no clubbing / cyanosis. No joint deformity upper and lower extremities.   Skin: no significant rashes, lesions, ulcers. Warm, dry, well-perfused. Neurologic: CN 2-12  grossly intact. Sensation intact, DTR normal. Strength 5/5 in all 4 limbs. Alert and oriented.  Psychiatric: Very pleasant. Cooperative.    Labs and Imaging on Admission: I have personally reviewed following labs and imaging studies  CBC: Recent Labs  Lab 03/10/21 1707  WBC 6.3  NEUTROABS 3.9  HGB 13.6  HCT 40.6  MCV 86.8  PLT 332   Basic Metabolic Panel: Recent Labs  Lab 03/10/21 1707  NA 141  K 4.0  CL 107  CO2 24  GLUCOSE 87  BUN 16  CREATININE 1.11  CALCIUM 9.2   GFR: CrCl cannot  be calculated (Unknown ideal weight.). Liver Function Tests: Recent Labs  Lab 03/10/21 1707  AST 28  ALT 35  ALKPHOS 43  BILITOT 0.9  PROT 6.8  ALBUMIN 3.9   No results for input(s): LIPASE, AMYLASE in the last 168 hours. No results for input(s): AMMONIA in the last 168 hours. Coagulation Profile: Recent Labs  Lab 03/10/21 1707  INR 1.0   Cardiac Enzymes: No results for input(s): CKTOTAL, CKMB, CKMBINDEX, TROPONINI in the last 168 hours. BNP (last 3 results) No results for input(s): PROBNP in the last 8760 hours. HbA1C: No results for input(s): HGBA1C in the last 72 hours. CBG: No results for input(s): GLUCAP in the last 168 hours. Lipid Profile: Recent Labs    03/10/21 1707  CHOL 154  HDL 48  LDLCALC 78  TRIG 139  CHOLHDL 3.2   Thyroid Function Tests: No results for input(s): TSH, T4TOTAL, FREET4, T3FREE, THYROIDAB in the last 72 hours. Anemia Panel: No results for input(s): VITAMINB12, FOLATE, FERRITIN, TIBC, IRON, RETICCTPCT in the last 72 hours. Urine analysis: No results found for: COLORURINE, APPEARANCEUR, LABSPEC, PHURINE, GLUCOSEU, HGBUR, BILIRUBINUR, KETONESUR, PROTEINUR, UROBILINOGEN, NITRITE, LEUKOCYTESUR Sepsis Labs: @LABRCNTIP(procalcitonin:4,lacticidven:4) )No results found for this or any previous visit (from the past 240 hour(s)).   Radiological Exams on Admission: CT ANGIO HEAD NECK W WO CM  Result Date: 03/10/2021 CLINICAL DATA:  Small  clot in left eye vessel contrast. EXAM: CT ANGIOGRAPHY HEAD AND NECK TECHNIQUE: Multidetector CT imaging of the head and neck was performed using the standard protocol during bolus administration of intravenous contrast. Multiplanar CT image reconstructions and MIPs were obtained to evaluate the vascular anatomy. Carotid stenosis measurements (when applicable) are obtained utilizing NASCET criteria, using the distal internal carotid diameter as the denominator. CONTRAST:  80mL OMNIPAQUE IOHEXOL 350 MG/ML SOLN COMPARISON:  None. FINDINGS: CT HEAD FINDINGS Brain: No evidence of acute infarction, hemorrhage, cerebral edema, mass, mass effect, or midline shift. No hydrocephalus or extra-axial fluid collection. Vascular: No hyperdense vessel. Skull: Normal. Negative for fracture or focal lesion. Sinuses/Orbits: No acute finding. Other: The mastoid air cells are well aerated. CTA NECK FINDINGS Aortic arch: Standard branching. Imaged portion shows no evidence of aneurysm or dissection. No significant stenosis of the major arch vessel origins. Aortic atherosclerosis. Right carotid system: No evidence of dissection, stenosis (50% or greater) or occlusion. Left carotid system: No evidence of dissection, stenosis (50% or greater) or occlusion. Vertebral arteries: Codominant. No evidence of dissection, stenosis (50% or greater) or occlusion. Skeleton: No acute osseous abnormality. Other neck: Negative Upper chest: No focal pulmonary opacity. Moderate to severe coronary artery calcifications, incompletely evaluated. Review of the MIP images confirms the above findings CTA HEAD FINDINGS Anterior circulation: Both internal carotid arteries are patent to the termini, but demonstrate moderate calcified narrowing bilaterally. The origin of the bilateral ophthalmic arteries is not well seen, however contrast is noted opacifying the bilateral ophthalmic arteries proximally A1 segments patent. Normal anterior communicating artery.  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 No M1 stenosis or occlusion. Normal MCA bifurcations. Distal MCA branches perfused and symmetric. Posterior circulation: Vertebral arteries patent to the vertebrobasilar junction without stenosis. Posterior inferior cerebral arteries patent bilaterally. Basilar patent to its distal aspect. Superior cerebellar arteries patent bilaterally. PCAs perfused to their distal aspects without stenosis. The bilateral posterior communicating arteries are patent. Venous sinuses: As permitted by contrast timing, patent. Anatomic variants: As described above. Review of the MIP images confirms the above findings IMPRESSION: 1. No intracranial large vessel occlusion. The origins of   the ophthalmic arteries are not well visualized, however there appears to be flow in the proximal intraorbital ophthalmic arteries bilaterally. 2. No hemodynamically significant stenosis in the neck. 3. No acute intracranial process. 4. Aortic Atherosclerosis (ICD10-I70.0) and coronary atherosclerosis. Electronically Signed   By: Merilyn Baba M.D.   On: 03/10/2021 18:57    EKG: Independently reviewed. Sinus rhythm, LAE, QTc 602 ms.   Assessment/Plan  1. BRAO, left; b/l hand paraesthesia  - Presents from ophthalmology clinic for further eval of left BRAO after being seen there for 4 days of monocular visual deficit  - He also has had intermittent hand numbness bilaterally that is worse at night and likely peripheral  - No acute findings on head CT, no carotid etiology identified on CTA, and ESR and CRP are normal in ED  - Plan to check MRI brain, echocardiogram, and continue cardiac monitoring    2. CAD  - No anginal complaints  - Continue Crestor, Plavix, metoprolol   3. Prolonged QT interval  - QTc is 602 ms on EKG in ED  - Continue cardiac monitoring, check magnesium level, avoid QT-prolonging medications    DVT prophylaxis: Lovenox  Code Status: Full  Level of Care:  Level of care: Telemetry  Medical Family Communication: none present  Disposition Plan:  Patient is from: home  Anticipated d/c is to: Home  Anticipated d/c date is: 11/19 or 03/12/21 Patient currently: Pending MRI brain, echo, cardiac monitoring  Consults called: Neurology  Admission status: Observation     Vianne Bulls, MD [facility]  03/10/2021, 8:17 PM

## 2021-03-11 ENCOUNTER — Observation Stay (HOSPITAL_BASED_OUTPATIENT_CLINIC_OR_DEPARTMENT_OTHER): Payer: Medicare PPO

## 2021-03-11 DIAGNOSIS — H34232 Retinal artery branch occlusion, left eye: Secondary | ICD-10-CM | POA: Diagnosis not present

## 2021-03-11 DIAGNOSIS — H539 Unspecified visual disturbance: Secondary | ICD-10-CM | POA: Diagnosis not present

## 2021-03-11 DIAGNOSIS — R9431 Abnormal electrocardiogram [ECG] [EKG]: Secondary | ICD-10-CM | POA: Diagnosis not present

## 2021-03-11 LAB — HEMOGLOBIN A1C
Hgb A1c MFr Bld: 5.3 % (ref 4.8–5.6)
Mean Plasma Glucose: 105.41 mg/dL

## 2021-03-11 LAB — ECHOCARDIOGRAM COMPLETE
AR max vel: 3.44 cm2
AV Area VTI: 2.85 cm2
AV Area mean vel: 3.11 cm2
AV Mean grad: 6.2 mmHg
AV Peak grad: 11.7 mmHg
Ao pk vel: 1.71 m/s
Area-P 1/2: 3.91 cm2
Calc EF: 63.7 %
Height: 71 in
S' Lateral: 2.8 cm
Single Plane A2C EF: 63.7 %
Single Plane A4C EF: 62 %
Weight: 3536 oz

## 2021-03-11 LAB — CBC
HCT: 42.2 % (ref 39.0–52.0)
Hemoglobin: 13.9 g/dL (ref 13.0–17.0)
MCH: 28.6 pg (ref 26.0–34.0)
MCHC: 32.9 g/dL (ref 30.0–36.0)
MCV: 86.8 fL (ref 80.0–100.0)
Platelets: 215 10*3/uL (ref 150–400)
RBC: 4.86 MIL/uL (ref 4.22–5.81)
RDW: 13.9 % (ref 11.5–15.5)
WBC: 6.2 10*3/uL (ref 4.0–10.5)
nRBC: 0 % (ref 0.0–0.2)

## 2021-03-11 LAB — BASIC METABOLIC PANEL
Anion gap: 10 (ref 5–15)
BUN: 14 mg/dL (ref 8–23)
CO2: 24 mmol/L (ref 22–32)
Calcium: 9 mg/dL (ref 8.9–10.3)
Chloride: 105 mmol/L (ref 98–111)
Creatinine, Ser: 1.02 mg/dL (ref 0.61–1.24)
GFR, Estimated: >60 mL/min (ref >60)
Glucose, Bld: 90 mg/dL (ref 70–99)
Potassium: 3.6 mmol/L (ref 3.5–5.1)
Sodium: 139 mmol/L (ref 135–145)

## 2021-03-11 LAB — MAGNESIUM: Magnesium: 2.4 mg/dL (ref 1.7–2.4)

## 2021-03-11 LAB — HIV ANTIBODY (ROUTINE TESTING W REFLEX): HIV Screen 4th Generation wRfx: NONREACTIVE

## 2021-03-11 MED ORDER — TICAGRELOR 90 MG PO TABS: 180.0000 mg | ORAL_TABLET | Freq: Once | ORAL | Status: DC

## 2021-03-11 MED ORDER — ASPIRIN EC 81 MG PO TBEC
81.0000 mg | DELAYED_RELEASE_TABLET | Freq: Every day | ORAL | Status: DC
  Administered 2021-03-11: 81 mg via ORAL
  Filled 2021-03-11: qty 1

## 2021-03-11 MED ORDER — TICAGRELOR 90 MG PO TABS: 90.0000 mg | ORAL_TABLET | Freq: Two times a day (BID) | ORAL | Status: DC

## 2021-03-11 MED ORDER — ASPIRIN 81 MG PO TBEC
81.0000 mg | DELAYED_RELEASE_TABLET | Freq: Every day | ORAL | 0 refills | Status: DC
Start: 2021-03-12 — End: 2021-04-11

## 2021-03-11 MED ORDER — TICAGRELOR 90 MG PO TABS: 90.0000 mg | ORAL_TABLET | Freq: Two times a day (BID) | ORAL | 0 refills | Status: AC

## 2021-03-11 MED ORDER — TICAGRELOR 90 MG PO TABS
90.0000 mg | ORAL_TABLET | Freq: Two times a day (BID) | ORAL | Status: DC
  Administered 2021-03-11: 90 mg via ORAL
  Filled 2021-03-11: qty 1

## 2021-03-11 NOTE — ED Notes (Signed)
Is the patient supposed to be observation? And please get discharge order canceled if staying

## 2021-03-11 NOTE — Care Management Obs Status (Signed)
[facility] NOTIFICATION   Patient Details  Name: Shane Wood MRN: [id number] Date of Birth: 26-Nov-1955   Medicare Observation Status Notification Given:  Yes  Notice given via phone due to remote. Patient verbalized permission to sign the notice given. Copy of notice on DC instructions as patient states he may go home  Verdell Carmine, RN 03/11/2021, 4:11 PM

## 2021-03-11 NOTE — Evaluation (Signed)
Occupational Therapy Evaluation Patient Details Name: Shane Wood MRN: [id number] DOB: 25-May-1955 Today's Date: 03/11/2021   History of Present Illness 65 y.o. male with medical history significant for coronary artery disease with NSTEMI in 2019, now presenting to the emergency department with vision disturbance involving the left eye.   Clinical Impression   Patient admitted for the diagnosis above.  He states the visual disturbance is resolving, and he is at his baseline for all ADL/IADL and mobility.  No OT needs identified.  Recommend home with ophthalmology follow up as prescribed.         Recommendations for follow up therapy are one component of a multi-disciplinary discharge planning process, led by the attending physician.  Recommendations may be updated based on patient status, additional functional criteria and insurance authorization.   Follow Up Recommendations  No OT follow up    Assistance Recommended at Discharge None  Functional Status Assessment  Patient has not had a recent decline in their functional status  Equipment Recommendations  None recommended by OT    Recommendations for Other Services       Precautions / Restrictions Precautions Precautions: None Restrictions Weight Bearing Restrictions: No      Mobility Bed Mobility Overal bed mobility: Independent                  Transfers Overall transfer level: Independent                        Balance Overall balance assessment: No apparent balance deficits (not formally assessed)                                         ADL either performed or assessed with clinical judgement   ADL Overall ADL's : At baseline                                             Vision Baseline Vision/History: 1 Wears glasses Vision Assessment?: No apparent visual deficits Additional Comments: Large floater seen previously is disappearing.  Sees it as a shadow  now.  Self reports vision is close to baseline     Perception Perception Perception: Not tested   Praxis Praxis Praxis: Not tested    Pertinent Vitals/Pain Pain Assessment: No/denies pain     Hand Dominance Left   Extremity/Trunk Assessment Upper Extremity Assessment Upper Extremity Assessment: Overall WFL for tasks assessed   Lower Extremity Assessment Lower Extremity Assessment: Overall WFL for tasks assessed   Cervical / Trunk Assessment Cervical / Trunk Assessment: Normal   Communication Communication Communication: No difficulties   Cognition Arousal/Alertness: Awake/alert Behavior During Therapy: WFL for tasks assessed/performed Overall Cognitive Status: Within Functional Limits for tasks assessed                                       General Comments       Exercises     Shoulder Instructions      Home Living Family/patient expects to be discharged to:: Private residence Living Arrangements: Spouse/significant other Available Help at Discharge: Available 24 hours/day Type of Home: House  Home Equipment: None          Prior Functioning/Environment Prior Level of Function : Independent/Modified Independent                        OT Problem List: Other (comment) (visual disturbance)      OT Treatment/Interventions:      OT Goals(Current goals can be found in the care plan section) Acute Rehab OT Goals Patient Stated Goal: Return home OT Goal Formulation: With patient Potential to Achieve Goals: Good  OT Frequency:     Barriers to D/C:            Co-evaluation              AM-PAC OT "6 Clicks" Daily Activity     Outcome Measure Help from another person eating meals?: None Help from another person taking care of personal grooming?: None Help from another person toileting, which includes using toliet, bedpan, or urinal?: None Help from another person bathing (including washing,  rinsing, drying)?: None Help from another person to put on and taking off regular upper body clothing?: None Help from another person to put on and taking off regular lower body clothing?: None 6 Click Score: 24   End of Session    Activity Tolerance: Patient tolerated treatment well Patient left: in bed;with family/visitor present  OT Visit Diagnosis: Low vision, both eyes (H54.2)                Time: 9574-7340 OT Time Calculation (min): 14 min Charges:  OT General Charges $OT Visit: 1 Visit OT Evaluation $OT Eval Moderate Complexity: 1 Mod  03/11/2021  RP, OTR/L  Acute Rehabilitation Services  Office:  [phone]   Shane Wood 03/11/2021, 2:04 PM

## 2021-03-11 NOTE — Discharge Instructions (Signed)
Medicare Outpatient Observation Notice   Patient name:  Marcellis Frampton Melnyk Patient number:  [id number]                                                                                                                                                                       You're a hospital outpatient receiving observation services. You are not an inpatient because:    Retinal Artery Occlusion   You require hospital care for evaluation and/or treatment.  It is expected you will need hospital care for less than a total of two days.                                                                                                                                                                         Being an outpatient may affect what you pay in a hospital:   When you're a hospital outpatient, your observation stay is covered under Medicare Part B.   For Part B services, you generally pay:   A copayment for each outpatient hospital service you get. Part B copayments may vary by type of service.   20% of the Medicare-approved amount for most doctor services, after the Part B deductible.   Observation services may affect coverage and payment of your care after you leave the hospital:     If you need skilled nursing facility (SNF) care after you leave the hospital, Medicare Part A will only cover SNF care if you've had a 3-day minimum, medically necessary, inpatient hospital stay for a related illness or injury. An inpatient hospital stay begins the day the hospital admits you as an inpatient based on a doctor's order and doesn't include the day you're discharged.   If you have Medicaid, a Medicare Advantage plan or other health plan, Medicaid or the plan may have different rules for SNF coverage after you leave the hospital. Check with Medicaid or your plan.   NOTE: Medicare Part A generally doesn't cover outpatient hospital  services, like an observation stay. However, Part A will generally cover  medically necessary inpatient services if the hospital admits you as an inpatient based on a doctor's order. In most cases, you'll pay a one-time deductible for all of your inpatient hospital services for the first 60 days you're in a hospital.                                                                                                                                                                      If you have any questions about your observation services, ask the hospital staff member giving you this notice or the doctor providing your hospital care. You can also ask to speak with someone from the hospital's utilization or discharge planning department.   You can also call 1-800-MEDICARE (1-908-640-0351).  TTY users should call [phone].   Form CMS 98119-[id number]   Expiration 04/22/2021 [employer] APPROVAL [id number]          Your costs for medications:     Generally, prescription and over-the-counter drugs, including "self-administered drugs," you get in a hospital outpatient setting (like an emergency department) aren't covered by Part B. "Self- administered drugs" are drugs you'd normally take on your own. For safety reasons, many hospitals don't allow you to take medications brought from home. If you have a Medicare prescription drug plan (Part D), your plan may help you pay for these drugs. You'll likely need to pay out-of- pocket for these drugs and submit a claim to your drug plan for a refund. Contact your drug plan for more information.                                                                                                                                                                        If you're enrolled in a Medicare Advantage plan (like an HMO or PPO) or other Medicare health plan (Part C), your costs and coverage may be different. Check with your plan to find out about  coverage for outpatient observation services.    If you're a Qualified Medicare Beneficiary  through your state Medicaid program, you can't be billed for Part A or Part B deductibles, coinsurance, and copayments.                                                                                                                                                                      Additional Information (Optional):                                                                                                                                                                                Please sign below to show you received and understand this notice.                                          Date: 03/11/21 / Time:4:09 PM   CMS does not discriminate in its programs and activities. To request this publication in alternative format, please call: 1-800-MEDICARE or email:[email or web address].   Form CMS [id number]   Expiration 04/22/2021 [employer] APPROVAL [id number]

## 2021-03-11 NOTE — Progress Notes (Signed)
  Echocardiogram 2D Echocardiogram has been performed.  Bobbye Charleston 03/11/2021, 9:40 AM

## 2021-03-11 NOTE — Progress Notes (Signed)
VASCULAR LAB    TCD with bubbles has been performed.  See CV proc for preliminary results.   Rubena Roseman, RVT 03/11/2021, 3:50 PM

## 2021-03-11 NOTE — Progress Notes (Signed)
PT Cancellation Note  Patient Details Name: Shane Wood MRN: [id number] DOB: February 18, 1956   Cancelled Treatment:    Reason Eval/Treat Not Completed: Other (comment).  Pt declines PT politely, reporting gait is at PLOF.  Will follow up with him if he changes his mind, reported this to nursing.   Ramond Dial 03/11/2021, 11:10 AM  Mee Hives, PT PhD Acute Rehab Dept. Number: [place]

## 2021-03-11 NOTE — Progress Notes (Addendum)
STROKE TEAM PROGRESS NOTE   INTERVAL HISTORY He is sitting on the stretcher in NAD. He states that on Tuesday he started to have vision problems with the left eye and describes it has blurry with a large floater. He states he still sees shadows, otherwise no other neurological deficits. We discussed the plan to place him on ASA and Brilinta for 30 days and then Plavix alone. We also informed him to follow up with stroke clinic in 2 months. All questions answered and verbalized understanding  Vitals:   03/11/21 1000 03/11/21 1045 03/11/21 1100 03/11/21 1407  BP: (!) 156/90 (!) 167/93 (!) 150/77 (!) 150/81  Pulse: 72 68 68 76  Resp: 14 19 17 18   Temp:      TempSrc:      SpO2: 98% 100% 98% 99%  Weight:      Height:       CBC:  Recent Labs  Lab 03/10/21 1707 03/11/21 0315  WBC 6.3 6.2  NEUTROABS 3.9  --   HGB 13.6 13.9  HCT 40.6 42.2  MCV 86.8 86.8  PLT 232 242   Basic Metabolic Panel:  Recent Labs  Lab 03/10/21 1707 03/11/21 0315  NA 141 139  K 4.0 3.6  CL 107 105  CO2 24 24  GLUCOSE 87 90  BUN 16 14  CREATININE 1.11 1.02  CALCIUM 9.2 9.0  MG  --  2.4   Lipid Panel:  Recent Labs  Lab 03/10/21 1707  CHOL 154  TRIG 139  HDL 48  CHOLHDL 3.2  VLDL 28  LDLCALC 78   HgbA1c:  Recent Labs  Lab 03/11/21 0315  HGBA1C 5.3   Urine Drug Screen: No results for input(s): LABOPIA, COCAINSCRNUR, LABBENZ, AMPHETMU, THCU, LABBARB in the last 168 hours.  Alcohol Level No results for input(s): ETH in the last 168 hours.  IMAGING past 24 hours CT ANGIO HEAD NECK W WO CM  Result Date: 03/10/2021 CLINICAL DATA:  Small clot in left eye vessel contrast. EXAM: CT ANGIOGRAPHY HEAD AND NECK TECHNIQUE: Multidetector CT imaging of the head and neck was performed using the standard protocol during bolus administration of intravenous contrast. Multiplanar CT image reconstructions and MIPs were obtained to evaluate the vascular anatomy. Carotid stenosis measurements (when applicable)  are obtained utilizing NASCET criteria, using the distal internal carotid diameter as the denominator. CONTRAST:  35mL OMNIPAQUE IOHEXOL 350 MG/ML SOLN COMPARISON:  None. FINDINGS: CT HEAD FINDINGS Brain: No evidence of acute infarction, hemorrhage, cerebral edema, mass, mass effect, or midline shift. No hydrocephalus or extra-axial fluid collection. Vascular: No hyperdense vessel. Skull: Normal. Negative for fracture or focal lesion. Sinuses/Orbits: No acute finding. Other: The mastoid air cells are well aerated. CTA NECK FINDINGS Aortic arch: Standard branching. Imaged portion shows no evidence of aneurysm or dissection. No significant stenosis of the major arch vessel origins. Aortic atherosclerosis. Right carotid system: No evidence of dissection, stenosis (50% or greater) or occlusion. Left carotid system: No evidence of dissection, stenosis (50% or greater) or occlusion. Vertebral arteries: Codominant. No evidence of dissection, stenosis (50% or greater) or occlusion. Skeleton: No acute osseous abnormality. Other neck: Negative Upper chest: No focal pulmonary opacity. Moderate to severe coronary artery calcifications, incompletely evaluated. Review of the MIP images confirms the above findings CTA HEAD FINDINGS Anterior circulation: Both internal carotid arteries are patent to the termini, but demonstrate moderate calcified narrowing bilaterally. The origin of the bilateral ophthalmic arteries is not well seen, however contrast is noted opacifying the bilateral ophthalmic arteries  proximally A1 segments patent. Normal anterior communicating artery. 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 No M1 stenosis or occlusion. Normal MCA bifurcations. Distal MCA branches perfused and symmetric. Posterior circulation: Vertebral arteries patent to the vertebrobasilar junction without stenosis. Posterior inferior cerebral arteries patent bilaterally. Basilar patent to its distal aspect. Superior cerebellar arteries patent bilaterally. PCAs  perfused to their distal aspects without stenosis. The bilateral posterior communicating arteries are patent. Venous sinuses: As permitted by contrast timing, patent. Anatomic variants: As described above. Review of the MIP images confirms the above findings IMPRESSION: 1. No intracranial large vessel occlusion. The origins of the ophthalmic arteries are not well visualized, however there appears to be flow in the proximal intraorbital ophthalmic arteries bilaterally. 2. No hemodynamically significant stenosis in the neck. 3. No acute intracranial process. 4. Aortic Atherosclerosis (ICD10-I70.0) and coronary atherosclerosis. Electronically Signed   By: Merilyn Baba M.D.   On: 03/10/2021 18:57   MR BRAIN WO CONTRAST  Result Date: 03/10/2021 CLINICAL DATA:  Acute neurologic deficit.  Hand paresthesia. EXAM: MRI HEAD WITHOUT CONTRAST TECHNIQUE: Multiplanar, multiecho pulse sequences of the brain and surrounding structures were obtained without intravenous contrast. COMPARISON:  None. FINDINGS: Brain: No acute infarct, mass effect or extra-axial collection. No acute or chronic hemorrhage. There is multifocal hyperintense T2-weighted signal within the white matter. Generalized volume loss without a clear lobar predilection. The midline structures are normal. Vascular: Major flow voids are preserved. Skull and upper cervical spine: Normal calvarium and skull base. Visualized upper cervical spine and soft tissues are normal. Sinuses/Orbits:No paranasal sinus fluid levels or advanced mucosal thickening. No mastoid or middle ear effusion. Normal orbits. IMPRESSION: 1. No acute intracranial abnormality. 2. Findings of chronic small vessel ischemia and generalized volume loss. Electronically Signed   By: Ulyses Jarred M.D.   On: 03/10/2021 22:50   ECHOCARDIOGRAM COMPLETE  Result Date: 03/11/2021    ECHOCARDIOGRAM REPORT   Patient Name:   Shane Wood Date of Exam: 03/11/2021 Medical Rec #:  [id number]         Height:       71.0 in Accession #:    [id number]       Weight:       221.0 lb Date of Birth:  1955/08/02         BSA:          2.200 m Patient Age:    65 years         BP:           179/89 mmHg Patient Gender: M                HR:           81 bpm. Exam Location:  Inpatient Procedure: 2D Echo, 3D Echo, Cardiac Doppler and Color Doppler Indications:    TIA  History:        Patient has prior history of Echocardiogram examinations, most                 recent 08/24/2017. Previous Myocardial Infarction and CAD,                 Abnormal ECG; Arrythmias:PVC.  Sonographer:    Roseanna Rainbow RDCS Referring Phys: [id number] TIMOTHY S OPYD  Sonographer Comments: Interrupted for consult. IMPRESSIONS  1. Left ventricular ejection fraction, by estimation, is 60 to 65%. The left ventricle has normal function. The left ventricle has no regional wall motion abnormalities. There is mild left ventricular hypertrophy. Left ventricular diastolic parameters were  normal.  2. Right ventricular systolic function is normal. The right ventricular size is normal. Tricuspid regurgitation signal is inadequate for assessing PA pressure.  3. There is a 1.7 x 1.3 cm semi mobile mass in the right atrium that appears to be attached to the atrial septum (seen in subcostal views). Differential would include atrial myxoma or possible thrombus. With patient presenting with acute vision loss and  possible stroke would recommend TEE to better evaluate mass and also to look for any potential right to left shunting that could connect the right atrial finding to his clinical presentation.  4. The mitral valve is normal in structure. No evidence of mitral valve regurgitation. No evidence of mitral stenosis.  5. The aortic valve has an indeterminant number of cusps. There is moderate calcification of the aortic valve. There is moderate thickening of the aortic valve. Aortic valve regurgitation is not visualized. No aortic stenosis is present.  6. Aortic dilatation noted.  There is mild dilatation of the ascending aorta, measuring 38 mm.  7. The inferior vena cava is normal in size with greater than 50% respiratory variability, suggesting right atrial pressure of 3 mmHg. FINDINGS  Left Ventricle: Left ventricular ejection fraction, by estimation, is 60 to 65%. The left ventricle has normal function. The left ventricle has no regional wall motion abnormalities. The left ventricular internal cavity size was normal in size. There is  mild left ventricular hypertrophy. Left ventricular diastolic parameters were normal. Right Ventricle: The right ventricular size is normal. No increase in right ventricular wall thickness. Right ventricular systolic function is normal. Tricuspid regurgitation signal is inadequate for assessing PA pressure. Left Atrium: Left atrial size was normal in size. Right Atrium: There is a 1.7 x 1.3 cm semi mobile mass in the right atrium that appears to be attached to the atrial septum (seen in subcostal views). Differential would include atrial myxoma or possible thrombus. With patient presenting with acute vision loss and possible stroke would recommend TEE to better evaluate mass and also to look for any potential right to left shunting that could connect the right atrial finding to his clinical presentation. Right atrial size was normal in size. Pericardium: There is no evidence of pericardial effusion. Mitral Valve: The mitral valve is normal in structure. No evidence of mitral valve regurgitation. No evidence of mitral valve stenosis. Tricuspid Valve: The tricuspid valve is normal in structure. Tricuspid valve regurgitation is not demonstrated. No evidence of tricuspid stenosis. Aortic Valve: The aortic valve has an indeterminant number of cusps. There is moderate calcification of the aortic valve. There is moderate thickening of the aortic valve. There is moderate aortic valve annular calcification. Aortic valve regurgitation is not visualized. No aortic  stenosis is present. Aortic valve mean gradient measures 6.2 mmHg. Aortic valve peak gradient measures 11.7 mmHg. Aortic valve area, by VTI measures 2.85 cm. Pulmonic Valve: The pulmonic valve was not well visualized. Pulmonic valve regurgitation is not visualized. No evidence of pulmonic stenosis. Aorta: The aortic root is normal in size and structure and aortic dilatation noted. There is mild dilatation of the ascending aorta, measuring 38 mm. Venous: The inferior vena cava is normal in size with greater than 50% respiratory variability, suggesting right atrial pressure of 3 mmHg. IAS/Shunts: No atrial level shunt detected by color flow Doppler.  LEFT VENTRICLE PLAX 2D LVIDd:         4.30 cm     Diastology LVIDs:         2.80 cm  LV e' medial:    8.59 cm/s LV PW:         1.10 cm     LV E/e' medial:  9.1 LV IVS:        1.10 cm     LV e' lateral:   12.00 cm/s LVOT diam:     2.40 cm     LV E/e' lateral: 6.5 LV SV:         97 LV SV Index:   44 LVOT Area:     4.52 cm  LV Volumes (MOD) LV vol d, MOD A2C: 71.1 ml LV vol d, MOD A4C: 79.8 ml LV vol s, MOD A2C: 25.8 ml LV vol s, MOD A4C: 30.3 ml LV SV MOD A2C:     45.3 ml LV SV MOD A4C:     79.8 ml LV SV MOD BP:      48.9 ml RIGHT VENTRICLE             IVC RV S prime:     12.20 cm/s  IVC diam: 2.10 cm TAPSE (M-mode): 2.3 cm LEFT ATRIUM             Index        RIGHT ATRIUM           Index LA diam:        4.10 cm 1.86 cm/m   RA Area:     13.80 cm LA Vol (A2C):   38.3 ml 17.41 ml/m  RA Volume:   33.90 ml  15.41 ml/m LA Vol (A4C):   35.2 ml 16.00 ml/m LA Biplane Vol: 39.6 ml 18.00 ml/m  AORTIC VALVE AV Area (Vmax):    3.44 cm AV Area (Vmean):   3.11 cm AV Area (VTI):     2.85 cm AV Vmax:           170.83 cm/s AV Vmean:          113.587 cm/s AV VTI:            0.341 m AV Peak Grad:      11.7 mmHg AV Mean Grad:      6.2 mmHg LVOT Vmax:         130.00 cm/s LVOT Vmean:        78.200 cm/s LVOT VTI:          0.215 m LVOT/AV VTI ratio: 0.63  AORTA Ao Root diam: 3.80 cm  Ao Asc diam:  3.80 cm MITRAL VALVE MV Area (PHT): 3.91 cm    SHUNTS MV Decel Time: 194 msec    Systemic VTI:  0.21 m MV E velocity: 78.50 cm/s  Systemic Diam: 2.40 cm MV A velocity: 89.70 cm/s MV E/A ratio:  0.88 Carlyle Dolly MD Electronically signed by Carlyle Dolly MD Signature Date/Time: 03/11/2021/2:07:29 PM    Final     PHYSICAL EXAM  Temp:  [97.8 F (36.6 C)-97.9 F (36.6 C)] 97.8 F (36.6 C) (11/19 0536) Pulse Rate:  [62-87] 76 (11/19 1407) Resp:  [14-19] 18 (11/19 1407) BP: (132-179)/(77-98) 150/81 (11/19 1407) SpO2:  [96 %-100 %] 99 % (11/19 1407) Weight:  [100.2 kg] 100.2 kg (11/18 2056)  General -obese middle-aged Caucasian male in no apparent distress.  Ophthalmologic - fundi not visualized due to noncooperation.  Cardiovascular - Regular rhythm and rate.  Mental Status -  Level of arousal and orientation to time, place, and person were intact. Language including expression, naming, repetition, comprehension was assessed and found intact. Attention span and concentration  were normal. Recent and remote memory were intact. Fund of Knowledge was assessed and was intact.  Cranial Nerves II - XII - II - Visual field intact OU.  Left eye subjective floater has resolved III, IV, VI - Extraocular movements intact. V - Facial sensation intact bilaterally. VII - Facial movement intact bilaterally. VIII - Hearing & vestibular intact bilaterally. X - Palate elevates symmetrically. XI - Chin turning & shoulder shrug intact bilaterally. XII - Tongue protrusion intact.  Motor Strength - The patient's strength was normal in all extremities and pronator drift was absent.  Bulk was normal and fasciculations were absent.   Motor Tone - Muscle tone was assessed at the neck and appendages and was normal.  Sensory - Light touch, temperature/pinprick were assessed and were symmetrical.    Coordination - The patient had normal movements in the hands and feet with no ataxia or  dysmetria.  Tremor was absent.  Gait and Station - deferred.   ASSESSMENT/PLAN Shane Wood is a 65 y.o. 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 The visual change started on Tuesday,  left monocular visual blurring described as associated with a new, very large floater, with some gradual improvement since then, but still with significant visual impairment.The vision loss was non-painful  Left BRAO  CTA head & neck  1. No intracranial large vessel occlusion. The origins of the ophthalmic arteries are not well visualized, however there appears to be flow in the proximal intraorbital ophthalmic arteries bilaterally. 2. No hemodynamically significant stenosis in the neck. 3. No acute intracranial process. 4. Aortic Atherosclerosis (ICD10-I70.0) and coronary atherosclerosis.  MRI   1. No acute intracranial abnormality. 2. Findings of chronic small vessel ischemia and generalized volume loss.  2D Echo left ventricular ejection fraction 60 to 65%.  No wall motion abnormalities.  1.7 x 1.3 cm semimobile mass in the right atrium attached to the atrial septum etiology unclear  TCD bubble study negative for significant right-to-left shunt.  Solitary high intensity transient signal noted with Valsalva likely clinically insignificant shunt LDL 78 HgbA1c 5.3 VTE prophylaxis - Lovenox/ SCD's     Diet   Diet Heart Room service appropriate? Yes; Fluid consistency: Thin   clopidogrel 75 mg daily prior to admission, now on aspirin 81 mg daily and Brilinta (ticagrelor) 90 mg bid for 30 days then Plavix alone Therapy recommendations:  none Disposition:  home  Hypertension Home meds:  metoprolol Stable Permissive hypertension (OK if < 220/120) but gradually normalize in 5-7 days Long-term BP  goal normotensive  Hyperlipidemia Home meds:  Zetia and crestor, resumed in hospital LDL 78, mass of unclear significance < 70 Continue statin at discharge  Other Stroke Risk Factors Advanced Age >/= 68  Coronary artery disease Possible Obstructive sleep apnea  Patient able to be discharged home today Follow up with Neurology in 8 weeks   Hospital day # 0  Beulah Gandy, NP   STROKE MD NOTE :  I have personally obtained history,examined this patient, reviewed notes, independently viewed imaging studies, participated in medical decision making and plan of care.ROS completed by me personally and pertinent positives fully documented  I have made any additions or clarifications directly to the above note. Agree with note above.  Patient presented with left eye visual scotoma and partial vision loss.  As above which appears to have improved and seen an ophthalmologist and diagnosed with retinal artery branch occlusion in the left eye.  Neurovascular stroke related work-up is all negative.  2D echo shows some questionable right atrium mobile mass of unclear significance.  Patient informs me that this was also noted on the previous echo.  TCD bubble study is negative for right-to-left significant shunt.  Recommend aspirin and Brilinta for 30 days followed by Plavix alone given significant cardiac history.  Patient was advised to follow-up with his cardiologist Dr. Irish Lack to see if he needs further evaluation for a right atrial mass outpatient.  Follow-up with outpatient stroke clinic in 2 months.  Long discussion with patient and his wife Dr.Ramesh and answered questions.  Greater than 50% time during the 35-minute visit was spent in counseling and coordination of care and discussion with care team and answering questions. Antony Contras, MD Medical Director [facility] Pager: [phone] 03/11/2021 4:29 PM   To contact Stroke Continuity provider, please refer to [email or web address] After  hours, contact General Neurology

## 2021-03-11 NOTE — Discharge Summary (Signed)
Physician Discharge Summary  Zailyn Rowser Rosen CWC:[id number] DOB: [birth year] DOA: 03/10/2021  PCP: Colon Branch, MD  Admit date: 03/10/2021 Discharge date: 03/11/2021  Admitted From: home Disposition:  home  Recommendations for Outpatient Follow-up:  Follow up with PCP, Neurology and outpatient ophthalmology in 1-2 weeks   Home Health:no  Equipment/Devices: none  Discharge Condition: Stable Code Status:   Code Status: Full Code Diet recommendation:  Diet Order             Diet Heart Room service appropriate? Yes; Fluid consistency: Thin  Diet effective now                 Brief/Interim Summary: 65 y.o. male with medical history significant for coronary artery disease with NSTEMI in 2019, now presenting to the emergency department with vision disturbance involving the left eye.  Patient reports that he had been in his usual state of health on 03/07/2021 when he had acute onset of visual disturbance involving the left eye, described as "a big piece of lint."  This has improved, but there has been persistent distortion in the right visual field that resolves when he closes the left eye.  There is no associated pain.  He has had intermittent paresthesias involving the bilateral hands that has been more longstanding and worse at night.  He denies any other numbness and denies any weakness.  Denies any difficulty with speech or swallowing.  No recent fevers, chills, chest pain, cough, shortness of breath, or trauma.  He was seen by ophthalmology in the clinic today for his complaints, had findings suggestive of branch retinal artery occlusion, and he was directed to the ED for further evaluation.   ED Course: Upon arrival to the ED, patient is found to be afebrile, saturating well on room air, and with stable blood pressure.  EKG features sinus rhythm with QTC 602 ms.  Head CT negative for acute intracranial abnormality and no LVO found on CTA head and neck.  Basic blood work including CMP  and CBC are unremarkable.  Lipid panel was normal.  CRP is undetectable and ESR 12.  Neurology was consulted by the ED physician and hospitalists asked to admit. Patient was seen by neurologist. Patient admitted for new onset left monocular visual blurring in association with new scotoma with ophthalmology evaluation prior to presentation consistent with left BRAO-MRI brain was negative for acute finding showed chronic small vessel ischemia and generalized volume loss.  Advise aspirin Plavix, blood pressure management, echocardiogram HbA1c neurological monitoring  Discharge Diagnoses:   New onset left monocular visual blurring with new scotoma secondary to BRAO: MRI brain was negative for acute finding showed chronic small vessel ischemia and generalized volume loss.  Seen by neurology called by Dr Leonie Man- he advised asa and Brilinta for a month then stop both and go back to his plavix, cont with crestor 40, BP management, echocardiogram HbA1c- nl at 5.3, ldl  78.  OT eval for vision impairment. Once echo is back and normal he cal go home. He will need OP fu with neuro, ophthalmo.  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 Negative transcranial Doppler  Bubble study with no evidence of right to left intracardiac communication" discussed with Dr. Harrington Challenger and Dr. Leonie Man it appears patient has had  abnormal echocardiogram previously by Dr. Lionel December and family related that this was also noted on the previous echo given TCD bubble study is negative for right-to-left shunt okay to discharge no need for TEE at this time and patient will need to follow-up with Dr. Deeann Cree from cardiology who can review the echo further and if deemed necessary he can obtain TEE and  expedited fashion, message sent to Dr. Irish Lack-  again discussed with Dr. Harrington Challenger and Dr. Leonie Man and okay to discharge home.  CAD: No chest pain.  Continue Plavix Crestor and metoprolol. Numbness and tingling in bilateral over the long-term - OP f/u Prolonged QT interval: QTC 602 avoid qtc prolonging medications.  Repeat EKG  improved to 410.  Consults: Neurology  Subjective: Alert awake oriented, reports improving left visual blurring and distortion.  Discharge Exam: Vitals:   03/11/21 0900 03/11/21 1100  BP: (!) 179/89 (!) 150/77  Pulse: 87 68  Resp:  17  Temp:    SpO2:  98%   General: Pt is alert, awake, not in acute distress Cardiovascular: RRR, S1/S2 +, no rubs, no gallops Respiratory: CTA bilaterally, no wheezing, no rhonchi Abdominal: Soft, NT, ND, bowel sounds + Extremities: no edema, no cyanosis  Discharge Instructions  Discharge Instructions     Discharge instructions   Complete by: As directed    Continue with aspirin 81 and Brilinta 90 mg twice daily for a month after which stop both of them and go back to your Plavix once a day  CBC BMP in 1 week  Follow-up with ophthalmology as outpatient  Follow-up with neurology.  Please call call MD or return to ER for similar or worsening recurring problem that brought you to hospital or if any fever,nausea/vomiting,abdominal pain, uncontrolled pain, chest pain,  shortness of breath or any other alarming symptoms.  Please follow-up your doctor as instructed in a week time and call the office for appointment.  Please avoid alcohol, smoking, or any other illicit substance and maintain healthy habits including taking your regular medications as prescribed.  You were cared for by a hospitalist during your hospital stay. If you have any questions about your discharge medications or the care you received while you were in the hospital after you are discharged, you can call the unit and ask to speak with the hospitalist on call  if the hospitalist that took care of you is not available.  Once you are discharged, your primary care physician will handle any further medical issues. Please note that NO REFILLS for any discharge medications will be authorized once you are discharged, as it is imperative that you return to your primary care physician (or establish a relationship with a primary care physician if you do not have one) for your aftercare needs so that they can reassess your need for medications and monitor your lab values   Increase activity slowly   Complete by: As directed       Allergies as of 03/11/2021   No Known Allergies      Medication List     STOP taking these medications    clopidogrel 75 MG tablet Commonly known as: PLAVIX       TAKE these medications    aspirin 81 MG EC tablet Take 1 tablet (81 mg total) by mouth daily. Swallow whole. Start taking on: March 12, 2021   ezetimibe 10 MG tablet Commonly known as: ZETIA TAKE 1 TABLET BY MOUTH EVERY DAY   metoprolol tartrate 25 MG tablet Commonly known as:  LOPRESSOR TAKE HALF A TABLET (12.5 MG TOTAL) BY MOUTH 2 (TWO) TIMES DAILY.   multivitamin tablet Take 1 tablet by mouth every evening.   nitroGLYCERIN 0.4 MG SL tablet Commonly known as: NITROSTAT PLACE 1 TABLET (0.4 MG TOTAL) UNDER THE TONGUE EVERY 5 (FIVE) MINUTES X 3 DOSES AS NEEDED FOR CHEST PAIN.   pantoprazole 40 MG tablet Commonly known as: PROTONIX TAKE 1 TABLET BY MOUTH EVERY DAY   rosuvastatin 40 MG tablet Commonly known as: CRESTOR TAKE 1 TABLET BY MOUTH EVERYDAY AT BEDTIME   ticagrelor 90 MG Tabs tablet Commonly known as: BRILINTA Take 1 tablet (90 mg total) by mouth 2 (two) times daily. After 30 days go back to Plavix   VITAMIN B 12 PO Take 1 tablet by mouth every evening.   vitamin C 1000 MG tablet Take 1,000 mg by mouth every evening.   Vitamin D 125 MCG (5000 UT) Caps Take 1 capsule by mouth in the morning.        Follow-up Derma, MD Follow up in 1 week(s).   Specialty: Internal Medicine Contact information: [address] [place] [address] [phone]         Jettie Booze, MD .   Specialties: Cardiology, Radiology, Interventional Cardiology Contact information: [address] [place] [address] [phone]                No Known Allergies  The results of significant diagnostics from this hospitalization (including imaging, microbiology, ancillary and laboratory) are listed below for reference.    Microbiology: Recent Results (from the past 240 hour(s))  Resp Panel by RT-PCR (Flu A&B, Covid) Nasopharyngeal Swab     Status: None   Collection Time: 03/10/21  8:21 PM   Specimen: Nasopharyngeal Swab; Nasopharyngeal(NP) swabs in vial transport medium  Result Value Ref Range Status   SARS Coronavirus 2 by RT PCR NEGATIVE NEGATIVE Final    Comment: (NOTE) SARS-CoV-2 target nucleic acids are NOT DETECTED.  The SARS-CoV-2 RNA is generally detectable in upper respiratory specimens during the acute phase of infection. The lowest concentration of SARS-CoV-2 viral copies this assay can detect is 138 copies/mL. A negative result does not preclude SARS-Cov-2 infection and should not be used as the sole basis for treatment or other patient management decisions. A negative result may occur with  improper specimen collection/handling, submission of specimen other than nasopharyngeal swab, presence of viral mutation(s) within the areas targeted by this assay, and inadequate number of viral copies(<138 copies/mL). A negative result must be combined with clinical observations, patient history, and epidemiological information. The expected result is Negative.  Fact Sheet for Patients:  EntrepreneurPulse.com.au  Fact Sheet for Healthcare Providers:  IncredibleEmployment.be  This test is no t yet approved or  cleared by the [place] FDA and  has been authorized for detection and/or diagnosis of SARS-CoV-2 by FDA under an Emergency Use Authorization (EUA). This EUA will remain  in effect (meaning this test can be used) for the duration of the COVID-19 declaration under Section 564(b)(1) of the Act, 21 U.S.C.section 360bbb-3(b)(1), unless the authorization is terminated  or revoked sooner.       Influenza A by PCR NEGATIVE NEGATIVE Final   Influenza B by PCR NEGATIVE NEGATIVE Final    Comment: (NOTE) The Xpert Xpress SARS-CoV-2/FLU/RSV plus assay is intended as an aid in the diagnosis of influenza from Nasopharyngeal swab specimens and should not be  used as a sole basis for treatment. Nasal washings and aspirates are unacceptable for Xpert Xpress SARS-CoV-2/FLU/RSV testing.  Fact Sheet for Patients: EntrepreneurPulse.com.au  Fact Sheet for Healthcare Providers: IncredibleEmployment.be  This test is not yet approved or cleared by the [place] FDA and has been authorized for detection and/or diagnosis of SARS-CoV-2 by FDA under an Emergency Use Authorization (EUA). This EUA will remain in effect (meaning this test can be used) for the duration of the COVID-19 declaration under Section 564(b)(1) of the Act, 21 U.S.C. section 360bbb-3(b)(1), unless the authorization is terminated or revoked.  Performed at [facility], [place] [address], [place], [place] [address]     Procedures/Studies: CT ANGIO HEAD NECK W WO CM  Result Date: 03/10/2021 CLINICAL DATA:  Small clot in left eye vessel contrast. EXAM: CT ANGIOGRAPHY HEAD AND NECK TECHNIQUE: Multidetector CT imaging of the head and neck was performed using the standard protocol during bolus administration of intravenous contrast. Multiplanar CT image reconstructions and MIPs were obtained to evaluate the vascular anatomy. Carotid stenosis measurements (when applicable) are obtained utilizing  NASCET criteria, using the distal internal carotid diameter as the denominator. CONTRAST:  8m OMNIPAQUE IOHEXOL 350 MG/ML SOLN COMPARISON:  None. FINDINGS: CT HEAD FINDINGS Brain: No evidence of acute infarction, hemorrhage, cerebral edema, mass, mass effect, or midline shift. No hydrocephalus or extra-axial fluid collection. Vascular: No hyperdense vessel. Skull: Normal. Negative for fracture or focal lesion. Sinuses/Orbits: No acute finding. Other: The mastoid air cells are well aerated. CTA NECK FINDINGS Aortic arch: Standard branching. Imaged portion shows no evidence of aneurysm or dissection. No significant stenosis of the major arch vessel origins. Aortic atherosclerosis. Right carotid system: No evidence of dissection, stenosis (50% or greater) or occlusion. Left carotid system: No evidence of dissection, stenosis (50% or greater) or occlusion. Vertebral arteries: Codominant. No evidence of dissection, stenosis (50% or greater) or occlusion. Skeleton: No acute osseous abnormality. Other neck: Negative Upper chest: No focal pulmonary opacity. Moderate to severe coronary artery calcifications, incompletely evaluated. Review of the MIP images confirms the above findings CTA HEAD FINDINGS Anterior circulation: Both internal carotid arteries are patent to the termini, but demonstrate moderate calcified narrowing bilaterally. The origin of the bilateral ophthalmic arteries is not well seen, however contrast is noted opacifying the bilateral ophthalmic arteries proximally A1 segments patent. Normal anterior communicating artery. 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 No M1 stenosis or occlusion. Normal MCA bifurcations. Distal MCA branches perfused and symmetric. Posterior circulation: Vertebral arteries patent to the vertebrobasilar junction without stenosis. Posterior inferior cerebral arteries patent bilaterally. Basilar patent to its distal aspect. Superior cerebellar arteries patent bilaterally. PCAs perfused to their  distal aspects without stenosis. The bilateral posterior communicating arteries are patent. Venous sinuses: As permitted by contrast timing, patent. Anatomic variants: As described above. Review of the MIP images confirms the above findings IMPRESSION: 1. No intracranial large vessel occlusion. The origins of the ophthalmic arteries are not well visualized, however there appears to be flow in the proximal intraorbital ophthalmic arteries bilaterally. 2. No hemodynamically significant stenosis in the neck. 3. No acute intracranial process. 4. Aortic Atherosclerosis (ICD10-I70.0) and coronary atherosclerosis. Electronically Signed   By: AMerilyn BabaM.D.   On: 03/10/2021 18:57   MR BRAIN WO CONTRAST  Result Date: 03/10/2021 CLINICAL DATA:  Acute neurologic deficit.  Hand paresthesia. EXAM: MRI HEAD WITHOUT CONTRAST TECHNIQUE: Multiplanar, multiecho pulse sequences of the brain and surrounding structures were obtained without intravenous contrast. COMPARISON:  None. FINDINGS: Brain: No acute infarct,  mass effect or extra-axial collection. No acute or chronic hemorrhage. There is multifocal hyperintense T2-weighted signal within the white matter. Generalized volume loss without a clear lobar predilection. The midline structures are normal. Vascular: Major flow voids are preserved. Skull and upper cervical spine: Normal calvarium and skull base. Visualized upper cervical spine and soft tissues are normal. Sinuses/Orbits:No paranasal sinus fluid levels or advanced mucosal thickening. No mastoid or middle ear effusion. Normal orbits. IMPRESSION: 1. No acute intracranial abnormality. 2. Findings of chronic small vessel ischemia and generalized volume loss. Electronically Signed   By: Ulyses Jarred M.D.   On: 03/10/2021 22:50    Labs: BNP (last 3 results) No results for input(s): BNP in the last 8760 hours. Basic Metabolic Panel: Recent Labs  Lab 03/10/21 1707 03/11/21 0315  NA 141 139  K 4.0 3.6  CL 107  105  CO2 24 24  GLUCOSE 87 90  BUN 16 14  CREATININE 1.11 1.02  CALCIUM 9.2 9.0  MG  --  2.4   Liver Function Tests: Recent Labs  Lab 03/10/21 1707  AST 28  ALT 35  ALKPHOS 43  BILITOT 0.9  PROT 6.8  ALBUMIN 3.9   No results for input(s): LIPASE, AMYLASE in the last 168 hours. No results for input(s): AMMONIA in the last 168 hours. CBC: Recent Labs  Lab 03/10/21 1707 03/11/21 0315  WBC 6.3 6.2  NEUTROABS 3.9  --   HGB 13.6 13.9  HCT 40.6 42.2  MCV 86.8 86.8  PLT 232 215   Cardiac Enzymes: No results for input(s): CKTOTAL, CKMB, CKMBINDEX, TROPONINI in the last 168 hours. BNP: Invalid input(s): POCBNP CBG: No results for input(s): GLUCAP in the last 168 hours. D-Dimer No results for input(s): DDIMER in the last 72 hours. Hgb A1c Recent Labs    03/11/21 0315  HGBA1C 5.3   Lipid Profile Recent Labs    03/10/21 1707  CHOL 154  HDL 48  LDLCALC 78  TRIG 139  CHOLHDL 3.2   Thyroid function studies No results for input(s): TSH, T4TOTAL, T3FREE, THYROIDAB in the last 72 hours.  Invalid input(s): FREET3 Anemia work up No results for input(s): VITAMINB12, FOLATE, FERRITIN, TIBC, IRON, RETICCTPCT in the last 72 hours. Urinalysis No results found for: COLORURINE, APPEARANCEUR, [place], [place], GLUCOSEU, [place], [place], [place], PROTEINUR, UROBILINOGEN, NITRITE, LEUKOCYTESUR Sepsis Labs Invalid input(s): PROCALCITONIN,  WBC,  LACTICIDVEN Microbiology Recent Results (from the past 240 hour(s))  Resp Panel by RT-PCR (Flu A&B, Covid) Nasopharyngeal Swab     Status: None   Collection Time: 03/10/21  8:21 PM   Specimen: Nasopharyngeal Swab; Nasopharyngeal(NP) swabs in vial transport medium  Result Value Ref Range Status   SARS Coronavirus 2 by RT PCR NEGATIVE NEGATIVE Final    Comment: (NOTE) SARS-CoV-2 target nucleic acids are NOT DETECTED.  The SARS-CoV-2 RNA is generally detectable in upper respiratory specimens during the acute phase of infection.  The lowest concentration of SARS-CoV-2 viral copies this assay can detect is 138 copies/mL. A negative result does not preclude SARS-Cov-2 infection and should not be used as the sole basis for treatment or other patient management decisions. A negative result may occur with  improper specimen collection/handling, submission of specimen other than nasopharyngeal swab, presence of viral mutation(s) within the areas targeted by this assay, and inadequate number of viral copies(<138 copies/mL). A negative result must be combined with clinical observations, patient history, and epidemiological information. The expected result is Negative.  Fact Sheet for Patients:  EntrepreneurPulse.com.au  Fact Sheet  for Healthcare Providers:  IncredibleEmployment.be  This test is no t yet approved or cleared by the [place] and  has been authorized for detection and/or diagnosis of SARS-CoV-2 by FDA under an Emergency Use Authorization (EUA). This EUA will remain  in effect (meaning this test can be used) for the duration of the COVID-19 declaration under Section 564(b)(1) of the Act, 21 U.S.C.section 360bbb-3(b)(1), unless the authorization is terminated  or revoked sooner.       Influenza A by PCR NEGATIVE NEGATIVE Final   Influenza B by PCR NEGATIVE NEGATIVE Final    Comment: (NOTE) The Xpert Xpress SARS-CoV-2/FLU/RSV plus assay is intended as an aid in the diagnosis of influenza from Nasopharyngeal swab specimens and should not be used as a sole basis for treatment. Nasal washings and aspirates are unacceptable for Xpert Xpress SARS-CoV-2/FLU/RSV testing.  Fact Sheet for Patients: EntrepreneurPulse.com.au  Fact Sheet for Healthcare Providers: IncredibleEmployment.be  This test is not yet approved or cleared by the [place] FDA and has been authorized for detection and/or diagnosis of SARS-CoV-2 by FDA  under an Emergency Use Authorization (EUA). This EUA will remain in effect (meaning this test can be used) for the duration of the COVID-19 declaration under Section 564(b)(1) of the Act, 21 U.S.C. section 360bbb-3(b)(1), unless the authorization is terminated or revoked.  Performed at [facility], [place] [address], [place], [place] [address]      Time coordinating discharge: 25 minutes  SIGNED: Antonieta Pert, MD  [facility] 03/11/2021, 11:27 AM  If 7PM-7AM, please contact night-coverage [email or web address]

## 2021-03-11 NOTE — ED Notes (Signed)
Pt ambulated to RR well

## 2021-03-13 ENCOUNTER — Encounter: Payer: Self-pay | Admitting: *Deleted

## 2021-03-13 ENCOUNTER — Encounter: Payer: Self-pay | Admitting: Interventional Cardiology

## 2021-03-13 ENCOUNTER — Encounter: Payer: Self-pay | Admitting: Internal Medicine

## 2021-03-13 DIAGNOSIS — H539 Unspecified visual disturbance: Secondary | ICD-10-CM

## 2021-03-13 NOTE — Telephone Encounter (Signed)
    Spoke to the patient by phone about the risks and benefits of transesophageal echocardiography.  There is a question of a right atrial mass.  He also had concern for embolic phenomenon to his eye.  All questions about transesophageal echocardiogram were answered and he is willing to proceed.  We will also plan for carotid Doppler.

## 2021-03-13 NOTE — Telephone Encounter (Signed)
Dr Irish Lack spoke with patient.  I called patient and scheduled TEE for December 2,2022 with Dr Radford Pax.  I verbally went over all instructions with patient and will send copy to him through my chart.  Patient aware he will be called with appointment for carotid doppler and that this is done at the [facility] office.

## 2021-03-15 ENCOUNTER — Encounter (HOSPITAL_COMMUNITY): Payer: Self-pay | Admitting: Cardiology

## 2021-03-23 ENCOUNTER — Other Ambulatory Visit: Payer: Self-pay | Admitting: Interventional Cardiology

## 2021-03-23 DIAGNOSIS — H34232 Retinal artery branch occlusion, left eye: Secondary | ICD-10-CM | POA: Diagnosis not present

## 2021-03-23 DIAGNOSIS — H33102 Unspecified retinoschisis, left eye: Secondary | ICD-10-CM | POA: Diagnosis not present

## 2021-03-23 DIAGNOSIS — R931 Abnormal findings on diagnostic imaging of heart and coronary circulation: Secondary | ICD-10-CM

## 2021-03-23 DIAGNOSIS — H43812 Vitreous degeneration, left eye: Secondary | ICD-10-CM | POA: Diagnosis not present

## 2021-03-23 DIAGNOSIS — H31092 Other chorioretinal scars, left eye: Secondary | ICD-10-CM | POA: Diagnosis not present

## 2021-03-24 ENCOUNTER — Ambulatory Visit (HOSPITAL_COMMUNITY)
Admission: RE | Admit: 2021-03-24 | Discharge: 2021-03-24 | Disposition: A | Discharge status: Home / Self Care | Payer: Medicare PPO | Attending: Cardiology | Admitting: Cardiology

## 2021-03-24 ENCOUNTER — Encounter (HOSPITAL_COMMUNITY): Payer: Self-pay | Admitting: Cardiology

## 2021-03-24 ENCOUNTER — Ambulatory Visit (HOSPITAL_COMMUNITY): Payer: Medicare PPO | Admitting: Anesthesiology

## 2021-03-24 ENCOUNTER — Ambulatory Visit (HOSPITAL_BASED_OUTPATIENT_CLINIC_OR_DEPARTMENT_OTHER): Payer: Medicare PPO

## 2021-03-24 ENCOUNTER — Encounter (HOSPITAL_COMMUNITY)
Admission: RE | Disposition: A | Discharge status: Home / Self Care | Payer: Self-pay | Source: Home / Self Care | Attending: Cardiology

## 2021-03-24 ENCOUNTER — Other Ambulatory Visit: Payer: Self-pay

## 2021-03-24 DIAGNOSIS — I318 Other specified diseases of pericardium: Secondary | ICD-10-CM

## 2021-03-24 DIAGNOSIS — R931 Abnormal findings on diagnostic imaging of heart and coronary circulation: Secondary | ICD-10-CM

## 2021-03-24 DIAGNOSIS — I5189 Other ill-defined heart diseases: Secondary | ICD-10-CM

## 2021-03-24 DIAGNOSIS — Z79899 Other long term (current) drug therapy: Secondary | ICD-10-CM | POA: Diagnosis not present

## 2021-03-24 DIAGNOSIS — I700 Atherosclerosis of aorta: Secondary | ICD-10-CM | POA: Insufficient documentation

## 2021-03-24 DIAGNOSIS — I340 Nonrheumatic mitral (valve) insufficiency: Secondary | ICD-10-CM | POA: Diagnosis not present

## 2021-03-24 DIAGNOSIS — I371 Nonrheumatic pulmonary valve insufficiency: Secondary | ICD-10-CM | POA: Diagnosis not present

## 2021-03-24 DIAGNOSIS — Z7902 Long term (current) use of antithrombotics/antiplatelets: Secondary | ICD-10-CM | POA: Diagnosis not present

## 2021-03-24 DIAGNOSIS — H34232 Retinal artery branch occlusion, left eye: Secondary | ICD-10-CM | POA: Insufficient documentation

## 2021-03-24 DIAGNOSIS — I2510 Atherosclerotic heart disease of native coronary artery without angina pectoris: Secondary | ICD-10-CM | POA: Insufficient documentation

## 2021-03-24 DIAGNOSIS — I252 Old myocardial infarction: Secondary | ICD-10-CM | POA: Diagnosis not present

## 2021-03-24 DIAGNOSIS — E559 Vitamin D deficiency, unspecified: Secondary | ICD-10-CM | POA: Diagnosis not present

## 2021-03-24 DIAGNOSIS — R7303 Prediabetes: Secondary | ICD-10-CM | POA: Diagnosis not present

## 2021-03-24 HISTORY — PX: TEE WITHOUT CARDIOVERSION: SHX5443

## 2021-03-24 HISTORY — PX: BUBBLE STUDY: SHX6837

## 2021-03-24 SURGERY — ECHOCARDIOGRAM, TRANSESOPHAGEAL
Anesthesia: Monitor Anesthesia Care

## 2021-03-24 MED ORDER — PROPOFOL 500 MG/50ML IV EMUL
INTRAVENOUS | Status: DC | PRN
  Administered 2021-03-24: 100 ug/kg/min via INTRAVENOUS

## 2021-03-24 MED ORDER — PROPOFOL 10 MG/ML IV BOLUS
INTRAVENOUS | Status: DC | PRN
  Administered 2021-03-24: 30 mg via INTRAVENOUS

## 2021-03-24 MED ORDER — SODIUM CHLORIDE 0.9 % IV SOLN: INTRAVENOUS | Status: DC

## 2021-03-24 MED ORDER — BUTAMBEN-TETRACAINE-BENZOCAINE 2-2-14 % EX AERO
INHALATION_SPRAY | CUTANEOUS | Status: DC | PRN
  Administered 2021-03-24: 2 via TOPICAL

## 2021-03-24 MED ORDER — LACTATED RINGERS IV SOLN: INTRAVENOUS | Status: DC | PRN

## 2021-03-24 NOTE — Anesthesia Preprocedure Evaluation (Addendum)
Anesthesia Evaluation  Patient identified by MRN, date of birth, ID band Patient awake    Reviewed: Allergy & Precautions, NPO status , Patient's Chart, lab work & pertinent test results, reviewed documented beta blocker date and time   Airway Mallampati: III  TM Distance: <3 FB Neck ROM: Full    Dental  (+) Teeth Intact, Dental Advisory Given   Pulmonary former smoker,    Pulmonary exam normal breath sounds clear to auscultation       Cardiovascular hypertension, Pt. 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  Reproductive/Obstetrics                            Anesthesia Physical Anesthesia Plan  ASA: 3  Anesthesia Plan: MAC   Post-op Pain Management:    Induction: Intravenous  PONV Risk Score and Plan: 1 and Propofol infusion and Treatment may vary due to age or medical condition  Airway Management Planned: Natural Airway and Nasal Cannula  Additional Equipment:   Intra-op Plan:   Post-operative Plan:   Informed Consent: I have reviewed the patients History and Physical, chart, labs and discussed the procedure including the risks, benefits and alternatives for the proposed anesthesia with the patient or authorized representative who has indicated his/her understanding and acceptance.     Dental advisory given  Plan Discussed with: CRNA  Anesthesia Plan Comments:         Anesthesia Quick Evaluation

## 2021-03-24 NOTE — Interval H&P Note (Signed)
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  The various methods of treatment have been discussed with the patient and family. After consideration of risks, benefits and other options for treatment, the patient has consented to  Procedure(s): TRANSESOPHAGEAL ECHOCARDIOGRAM (TEE) (N/A) as a surgical intervention.  The patient's history has been reviewed, patient examined, no change in status, stable for surgery.  I have reviewed the patient's chart and labs.  Questions were answered to the patient's satisfaction.     Fransico Him

## 2021-03-24 NOTE — Transfer of Care (Signed)
Immediate Anesthesia Transfer of Care Note  Patient: Shane Wood  Procedure(s) Performed: TRANSESOPHAGEAL ECHOCARDIOGRAM (TEE) BUBBLE STUDY  Patient Location: Endoscopy Unit  Anesthesia Type:MAC  Level of Consciousness: drowsy and patient cooperative  Airway & Oxygen Therapy: Patient Spontanous Breathing  Post-op Assessment: Report given to RN and Post -op Vital signs reviewed and stable  Post vital signs: Reviewed and stable  Last Vitals:  Vitals Value Taken Time  BP 124/66 03/24/21 0939  Temp    Pulse 58 03/24/21 0941  Resp 22 03/24/21 0941  SpO2 95 % 03/24/21 0941  Vitals shown include unvalidated device data.  Last Pain:  Vitals:   03/24/21 0820  TempSrc: Other (Comment)  PainSc: 0-No pain         Complications: No notable events documented.

## 2021-03-24 NOTE — CV Procedure (Signed)
    PROCEDURE NOTE:  Procedure:  Transesophageal echocardiogram Operator:  Fransico Him, MD Indications:  CVA Complications: None  During this procedure the patient is administered a total of Propofol 230 mg to achieve and maintain moderate conscious sedation.  The patient's heart rate, blood pressure, and oxygen saturation are monitored continuously during the procedure by anesthesia.   Results: Normal LV size and function Normal RV size and function Images of the RA were suboptimal with significant shadowing from the interatrial septum.  Could not visualize an RA mass but images of poor quality.  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   Mild atherosclerosis of the thoracic and ascending aorta.  Recommdend cardiac MRI for further evaluation RA and possible mass seen on TTE give suboptimol views of RA on TEE  The patient tolerated the procedure well and was transferred back to their room in stable condition.  Signed: Fransico Him, MD [facility]

## 2021-03-24 NOTE — Anesthesia Postprocedure Evaluation (Signed)
Anesthesia Post Note  Patient: Shane Wood  Procedure(s) Performed: TRANSESOPHAGEAL ECHOCARDIOGRAM (TEE) BUBBLE STUDY     Patient location during evaluation: PACU Anesthesia Type: MAC Level of consciousness: awake and alert, awake and oriented Pain management: pain level controlled Vital Signs Assessment: post-procedure vital signs reviewed and stable Respiratory status: spontaneous breathing, nonlabored ventilation, respiratory function stable and patient connected to nasal cannula oxygen Cardiovascular status: stable and blood pressure returned to baseline Postop Assessment: no apparent nausea or vomiting Anesthetic complications: no   No notable events documented.  Last Vitals:  Vitals:   03/24/21 0950 03/24/21 1000  BP: (!) 114/56 126/79  Pulse: 66 63  Resp: 18 13  Temp:    SpO2: 99% 100%    Last Pain:  Vitals:   03/24/21 1010  TempSrc:   PainSc: 0-No pain                 Catalina Gravel

## 2021-03-24 NOTE — Discharge Instructions (Signed)

## 2021-03-26 ENCOUNTER — Encounter (HOSPITAL_COMMUNITY): Payer: Self-pay | Admitting: Cardiology

## 2021-03-28 ENCOUNTER — Other Ambulatory Visit: Payer: Self-pay

## 2021-03-28 ENCOUNTER — Ambulatory Visit (HOSPITAL_COMMUNITY)
Admission: RE | Admit: 2021-03-28 | Discharge: 2021-03-28 | Disposition: A | Discharge status: Home / Self Care | Payer: Medicare PPO | Source: Ambulatory Visit | Attending: Internal Medicine | Admitting: Internal Medicine

## 2021-03-28 DIAGNOSIS — H539 Unspecified visual disturbance: Secondary | ICD-10-CM | POA: Diagnosis not present

## 2021-03-28 DIAGNOSIS — H34232 Retinal artery branch occlusion, left eye: Secondary | ICD-10-CM | POA: Diagnosis not present

## 2021-03-30 NOTE — Progress Notes (Signed)
Shane Wood, Demetrios Isaacs would agree with getting an MRI, can get a better look to see if there is a mass and determine what it is  Gerald Stabs

## 2021-03-31 ENCOUNTER — Telehealth: Payer: Self-pay | Admitting: *Deleted

## 2021-03-31 DIAGNOSIS — H539 Unspecified visual disturbance: Secondary | ICD-10-CM

## 2021-03-31 DIAGNOSIS — R931 Abnormal findings on diagnostic imaging of heart and coronary circulation: Secondary | ICD-10-CM

## 2021-03-31 DIAGNOSIS — Z01812 Encounter for preprocedural laboratory examination: Secondary | ICD-10-CM

## 2021-03-31 NOTE — Telephone Encounter (Signed)
I spoke with patient and reviewed results with him.  He has a little soreness in his left neck and some soreness when swallowing at times.  Some left eye distortion but retinal specialist has told him this should improve.  Otherwise he is feeling fine

## 2021-03-31 NOTE — Telephone Encounter (Signed)
-----   Message from Jettie Booze, MD sent at 03/31/2021 12:19 PM EST ----- Mild atherosclerosis in the aorta. No left atrial appendage thrombus.  No clear cause of retinal artery embolism identified.  No clear right atrial mass.  Thickened interatrial septum noted again.  Could consider cardiac MRI to better look at the right atrium.  THis would hopefully rule out the concern for right atrial mass.  How is he feeling.  Any problems? ----- Message ----- From: Donato Heinz, MD Sent: 03/30/2021   7:15 AM EST To: Jettie Booze, MD     ----- Message ----- From: Jettie Booze, MD Sent: 03/29/2021  10:16 AM EST To: Donato Heinz, MD  Hi Chris, cMRI was recommended after TEE for the possible right atrial mass noted by echo.  Do you think MRI would be helpful? THanks

## 2021-03-31 NOTE — Telephone Encounter (Signed)
Reviewed with Dr Irish Lack and he would like patient to have cardiac MRI. I spoke with patient and he would like to proceed with this. Patient will need CBC prior to cardiac MRI . He is seeing Dr Larose Kells on April 11, 2021 and will see if CBC can be drawn at this appointment.  I verbally went over MRI instructions with patient and will send through my chart.

## 2021-04-03 ENCOUNTER — Other Ambulatory Visit: Payer: Self-pay | Admitting: *Deleted

## 2021-04-03 DIAGNOSIS — I779 Disorder of arteries and arterioles, unspecified: Secondary | ICD-10-CM

## 2021-04-03 NOTE — Progress Notes (Signed)
Carotid doppler to be done in December 2023

## 2021-04-06 NOTE — Telephone Encounter (Signed)
MRI is scheduled for 05/16/21.  If CBC is done on 12/20 this will be out of needed time frame.  Will need CBC closer to MRI date.  I placed call to patient and left message to call office.

## 2021-04-06 NOTE — Addendum Note (Signed)
Addended by: Thompson Grayer on: 04/06/2021 08:49 AM   Modules accepted: Orders

## 2021-04-06 NOTE — Telephone Encounter (Signed)
Patient notified.  He will stop by office for CBC on 1/17

## 2021-04-11 ENCOUNTER — Ambulatory Visit: Payer: Medicare PPO | Admitting: Internal Medicine

## 2021-04-11 ENCOUNTER — Encounter: Payer: Self-pay | Admitting: Internal Medicine

## 2021-04-11 DIAGNOSIS — H34232 Retinal artery branch occlusion, left eye: Secondary | ICD-10-CM

## 2021-04-11 NOTE — Patient Instructions (Signed)
It was good to see you  Please keep me posted  See you next year for your physical. Call anytime if need

## 2021-04-11 NOTE — Progress Notes (Signed)
Subjective:    Patient ID: Shane Wood, male    DOB: 1955/05/28, 65 y.o.   MRN: [id number]  DOS:  04/11/2021 Type of visit - description: ER follow-up  Admitted to hospital 03/10/2021:  Martin Majestic to his ophthalmologist with 4 days history of a monocular visual defect, bilateral hand numbness. Dx with (L)  BRAO  Work-up included: CTA head and neck.  No intracranial large vessel occlusion. Brain MRI: Chronic small vessel ischemia. Normal sed rate and CRP. 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 Negative transcranial Doppler. Was evaluated by both cardiology and neurology BP was noted to be elevated.    TEE December 12: Unable to visualize a RA mass but imaging is poor quality.  Recommend cardiac MRI  Review of Systems Since he left the hospital, took Brilinta and aspirin for 30 days instead of Plavix. Feeling well. Vision is somewhat better. Denies chest pain or difficulty breathing. No palpitations. As far as the numbness of the hands, that has been an ongoing issue for a while.     Past Medical History:  Diagnosis Date   Allergy    BACK PAIN    ELEVATED BP READING WITHOUT DX HYPERTENSION 05/03/2008   Qualifier: Diagnosis of  By: Larose Kells MD, Alda Berthold.    GERD (gastroesophageal reflux disease)    H/O cardiovascular stress test 2005    (-)   Hyperlipidemia    INSOMNIA-SLEEP DISORDER-UNSPEC    PVC (premature ventricular contraction)     Past Surgical History:  Procedure Laterality Date   ARTERY REPAIR Right 08/23/2017   Procedure: EXPLORATION RIGHT RADIAL ARTERY,  RIGHT FOREARM FASCIOTOMY;  Surgeon: Elam Dutch, MD;  Location: [place];  Service: Vascular;  Laterality: Right;   BUBBLE STUDY  03/24/2021   Procedure: BUBBLE STUDY;  Surgeon: Sueanne Margarita, MD;  Location: [place];  Service: Cardiovascular;;   CHOLECYSTECTOMY     CORONARY STENT INTERVENTION N/A 08/23/2017   Procedure: CORONARY STENT INTERVENTION;  Surgeon: Jettie Booze,  MD;  Location: [place] [facility];  Service: Cardiovascular;  Laterality: N/A;   LEFT HEART CATH AND CORONARY ANGIOGRAPHY N/A 08/23/2017   Procedure: LEFT HEART CATH AND CORONARY ANGIOGRAPHY;  Surgeon: Jettie Booze, MD;  Location: [place] [facility];  Service: Cardiovascular;  Laterality: N/A;   POLYPECTOMY     TEE WITHOUT CARDIOVERSION N/A 03/24/2021   Procedure: TRANSESOPHAGEAL ECHOCARDIOGRAM (TEE);  Surgeon: Sueanne Margarita, MD;  Location: [facility];  Service: Cardiovascular;  Laterality: N/A;   TONSILLECTOMY     age 29 y/o    Allergies as of 04/11/2021   No Known Allergies      Medication List        Accurate as of April 11, 2021 11:59 PM. If you have any questions, ask your nurse or doctor.          STOP taking these medications    aspirin 81 MG EC tablet Stopped by: Kathlene November, MD       TAKE these medications    clopidogrel 75 MG tablet Commonly known as: PLAVIX Take 75 mg by mouth daily.   ezetimibe 10 MG tablet Commonly known as: ZETIA TAKE 1 TABLET BY MOUTH EVERY DAY   metoprolol tartrate 25 MG tablet Commonly known as: LOPRESSOR TAKE HALF A TABLET (12.5 MG TOTAL) BY MOUTH 2 (TWO) TIMES DAILY.   multivitamin tablet Take 1 tablet by mouth every evening.   nitroGLYCERIN 0.4 MG SL tablet Commonly known as: NITROSTAT PLACE  1 TABLET (0.4 MG TOTAL) UNDER THE TONGUE EVERY 5 (FIVE) MINUTES X 3 DOSES AS NEEDED FOR CHEST PAIN.   pantoprazole 40 MG tablet Commonly known as: PROTONIX TAKE 1 TABLET BY MOUTH EVERY DAY   rosuvastatin 40 MG tablet Commonly known as: CRESTOR TAKE 1 TABLET BY MOUTH EVERYDAY AT BEDTIME   VITAMIN B 12 PO Take 1 tablet by mouth every evening.   vitamin C 1000 MG tablet Take 1,000 mg by mouth every evening.   Vitamin D 125 MCG (5000 UT) Caps Take 1 capsule by mouth in the morning.           Objective:   Physical Exam BP 136/82 (BP Location: Left Arm, Patient Position: Sitting, Cuff Size: Normal)    Pulse 82     Temp 97.9 F (36.6 C) (Oral)    Resp 18    Ht 5\' 11"  (1.803 m)    Wt 232 lb 8 oz (105.5 kg)    SpO2 97%    BMI 32.43 kg/m  General:   Well developed, NAD, BMI noted. HEENT:  Normocephalic . Face symmetric, atraumatic Lungs:  CTA B Normal respiratory effort, no intercostal retractions, no accessory muscle use. Heart: RRR,  no murmur.  Lower extremities: no pretibial edema bilaterally  Skin: Not pale. Not jaundice Neurologic:  alert & oriented X3.  Speech normal, gait appropriate for age and unassisted. EOMI, face symmetric, motor symmetric. Psych--  Cognition and judgment appear intact.  Cooperative with normal attention span and concentration.  Behavior appropriate. No anxious or depressed appearing.      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  No intracranial large vessel occlusion. Brain MRI: Chronic small vessel ischemia. Normal sed rate and CRP. 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 Negative transcranial Doppler. Was evaluated by both cardiology and neurology BP was noted to be elevated.    TEE December 12: Unable to visualize a RA mass but imaging is poor quality.  Recommend cardiac MRI  to rule out RA mass.  (Pending) He was switched from Plavix to Brilinta aspirin for 30 days, he is now back on Plavix. Plan:  Continue follow-up with neurology, cardiology.  Has also seen retina specialist Dr. Posey Pronto. Labs satisfactory, last LDL slightly above 70.  Rec to continue with Zetia, rosuvastatin 40 mg and watch diet closely Come back September 2023 for his physical, sooner if needed.   This visit  occurred during the SARS-CoV-2 public health emergency.  Safety protocols were in place, including screening questions prior to the visit, additional usage of staff PPE, and extensive cleaning of exam room while observing appropriate contact time as indicated for disinfecting solutions.

## 2021-04-12 DIAGNOSIS — H34232 Retinal artery branch occlusion, left eye: Secondary | ICD-10-CM | POA: Insufficient documentation

## 2021-04-12 NOTE — Assessment & Plan Note (Signed)
L Branch retinal artery occlusion Was admitted to the hospital, had extensive work-up: Work-up included: CTA head and neck.  No intracranial large vessel occlusion. Brain MRI: Chronic small vessel ischemia. Normal sed rate and CRP. 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 Negative transcranial Doppler. Was evaluated by both cardiology and neurology BP was noted to be elevated.    TEE December 12: Unable to visualize a RA mass but imaging is poor quality.  Recommend cardiac MRI  to rule out RA mass.  (Pending) He was switched from Plavix to Brilinta aspirin for 30 days, he is now back on Plavix. Plan:  Continue follow-up with neurology, cardiology.  Has also seen retina specialist Dr. Posey Pronto. Labs satisfactory, last LDL slightly above 70.  Rec to continue with Zetia, rosuvastatin 40 mg and watch diet closely Come back September 2023 for his physical, sooner if needed.

## 2021-04-20 ENCOUNTER — Other Ambulatory Visit: Payer: Self-pay | Admitting: Physician Assistant

## 2021-05-09 ENCOUNTER — Other Ambulatory Visit: Payer: Self-pay

## 2021-05-09 ENCOUNTER — Other Ambulatory Visit: Payer: Medicare PPO | Admitting: *Deleted

## 2021-05-09 DIAGNOSIS — H539 Unspecified visual disturbance: Secondary | ICD-10-CM

## 2021-05-09 DIAGNOSIS — Z01812 Encounter for preprocedural laboratory examination: Secondary | ICD-10-CM | POA: Diagnosis not present

## 2021-05-09 DIAGNOSIS — R931 Abnormal findings on diagnostic imaging of heart and coronary circulation: Secondary | ICD-10-CM | POA: Diagnosis not present

## 2021-05-09 LAB — CBC
Hematocrit: 41.8 % (ref 37.5–51.0)
Hemoglobin: 14.5 g/dL (ref 13.0–17.7)
MCH: 28.6 pg (ref 26.6–33.0)
MCHC: 34.7 g/dL (ref 31.5–35.7)
MCV: 82 fL (ref 79–97)
Platelets: 243 10*3/uL (ref 150–450)
RBC: 5.07 x10E6/uL (ref 4.14–5.80)
RDW: 15.4 % (ref 11.6–15.4)
WBC: 6.8 10*3/uL (ref 3.4–10.8)

## 2021-05-15 ENCOUNTER — Telehealth (HOSPITAL_COMMUNITY): Payer: Self-pay | Admitting: Emergency Medicine

## 2021-05-15 NOTE — Telephone Encounter (Signed)
Reaching out to patient to offer assistance regarding upcoming cardiac imaging study; pt verbalizes understanding of appt date/time, parking situation and where to check in,, and verified current allergies; name and call back number provided for further questions should they arise Marchia Bond RN Navigator Cardiac Imaging [facility] [phone] office [phone] cell  Gold wedding band difficult to remove Denies other metal implants Denies claustro Denies iv issues Arrival [phone]

## 2021-05-16 ENCOUNTER — Ambulatory Visit (HOSPITAL_COMMUNITY)
Admission: RE | Admit: 2021-05-16 | Discharge: 2021-05-16 | Disposition: A | Discharge status: Home / Self Care | Payer: Medicare PPO | Source: Ambulatory Visit | Attending: Interventional Cardiology | Admitting: Interventional Cardiology

## 2021-05-16 ENCOUNTER — Other Ambulatory Visit: Payer: Self-pay

## 2021-05-16 DIAGNOSIS — H539 Unspecified visual disturbance: Secondary | ICD-10-CM | POA: Insufficient documentation

## 2021-05-16 DIAGNOSIS — R931 Abnormal findings on diagnostic imaging of heart and coronary circulation: Secondary | ICD-10-CM | POA: Diagnosis not present

## 2021-05-16 IMAGING — MR MR CARD MORPHOLOGY WO/W CM
45 of 48 series · 45 of 48 positions shown · IV contrast (Contrast agent)
Comparison: none

CLINICAL DATA: Clinical question of interatrial mass
Study assumes BSA of 2.3 m2.

EXAM:
CARDIAC MRI
TECHNIQUE: The patient was scanned on a 1.5 Tesla GE magnet. A dedicated
cardiac coil was used. Functional imaging was done using Fiesta
sequences. [DATE], and 4 chamber views were done to assess for RWMA's.
Modified AUDE rule using a short axis stack was used to
calculate an ejection fraction on a dedicated work station using
Circle software. The patient received 10 cc of Gadavist. After 10
minutes inversion recovery sequences were used to assess for
infiltration and scar tissue.
CONTRAST:  10 cc  of Gadavist

[Series 4: [id number] · axial · 8.0mm · 1.56mm/px · 1 of 16 slices shown]
[im 1/16]
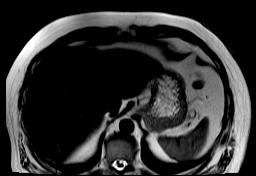

[Series 8: cine_trufi_cs_rt_short axis · oblique · 8.0mm · 1.92mm/px · 1 of 51 slices shown (1 of 20)]
[im 1/51]
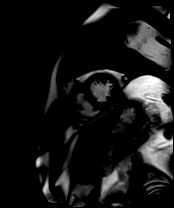

[Series 8: cine_trufi_cs_rt_short axis · oblique · 8.0mm · 1.92mm/px · 1 of 51 slices shown (2 of 20)]
[im 1/51]
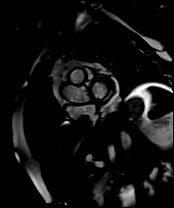

[Series 8: cine_trufi_cs_rt_short axis · oblique · 8.0mm · 1.92mm/px · 1 of 51 slices shown (3 of 20)]
[im 1/51]
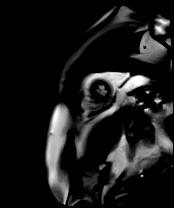

[Series 8: cine_trufi_cs_rt_short axis · oblique · 8.0mm · 1.92mm/px · 1 of 51 slices shown (4 of 20)]
[im 1/51]
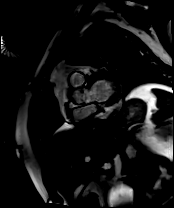

[Series 8: cine_trufi_cs_rt_short axis · oblique · 8.0mm · 1.92mm/px · 1 of 51 slices shown (5 of 20)]
[im 1/51]
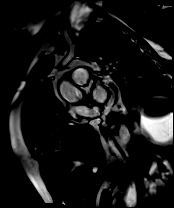

[Series 8: cine_trufi_cs_rt_short axis · oblique · 8.0mm · 1.92mm/px · 1 of 51 slices shown (6 of 20)]
[im 1/51]
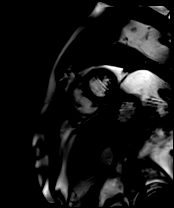

[Series 8: cine_trufi_cs_rt_short axis · oblique · 8.0mm · 1.92mm/px · 1 of 51 slices shown (7 of 20)]
[im 1/51]
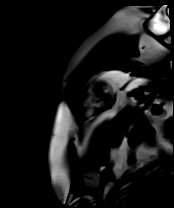

[Series 8: cine_trufi_cs_rt_short axis · oblique · 8.0mm · 1.92mm/px · 1 of 51 slices shown (8 of 20)]
[im 1/51]
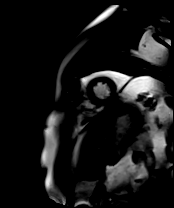

[Series 8: cine_trufi_cs_rt_short axis · oblique · 8.0mm · 1.92mm/px · 1 of 51 slices shown (9 of 20)]
[im 1/51]
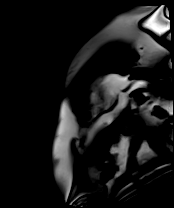

[Series 8: cine_trufi_cs_rt_short axis · oblique · 8.0mm · 1.92mm/px · 1 of 51 slices shown (10 of 20)]
[im 1/51]
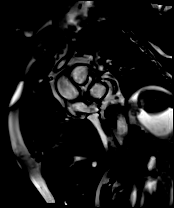

[Series 8: cine_trufi_cs_rt_short axis · oblique · 8.0mm · 1.92mm/px · 1 of 51 slices shown (11 of 20)]
[im 1/51]
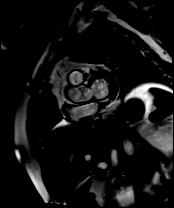

[Series 8: cine_trufi_cs_rt_short axis · oblique · 8.0mm · 1.92mm/px · 1 of 51 slices shown (12 of 20)]
[im 1/51]
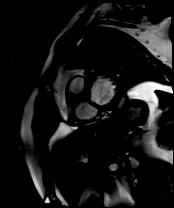

[Series 8: cine_trufi_cs_rt_short axis · oblique · 8.0mm · 1.92mm/px · 1 of 51 slices shown (13 of 20)]
[im 1/51]
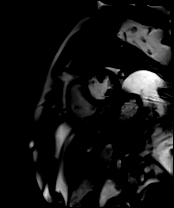

[Series 8: cine_trufi_cs_rt_short axis · oblique · 8.0mm · 1.92mm/px · 1 of 51 slices shown (14 of 20)]
[im 1/51]
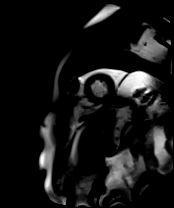

[Series 8: cine_trufi_cs_rt_short axis · oblique · 8.0mm · 1.92mm/px · 1 of 51 slices shown (15 of 20)]
[im 1/51]
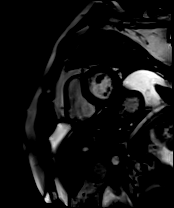

[Series 8: cine_trufi_cs_rt_short axis · oblique · 8.0mm · 1.92mm/px · 1 of 51 slices shown (16 of 20)]
[im 1/51]
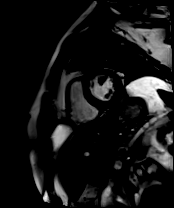

[Series 8: cine_trufi_cs_rt_short axis · oblique · 8.0mm · 1.92mm/px · 1 of 51 slices shown (17 of 20)]
[im 1/51]
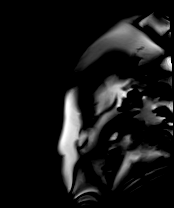

[Series 8: cine_trufi_cs_rt_short axis · oblique · 8.0mm · 1.92mm/px · 1 of 51 slices shown (18 of 20)]
[im 1/51]
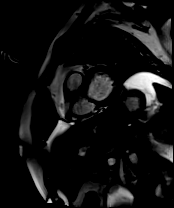

[Series 9: bSSFP · oblique · 8.0mm · 1.61mm/px · 1 of 25 slices shown (1 of 24)]
[im 1/25]
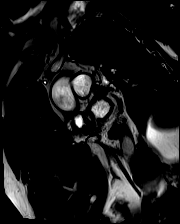

[Series 10: bSSFP · oblique · 8.0mm · 1.61mm/px · 1 of 25 slices shown (2 of 24)]
[im 1/25]
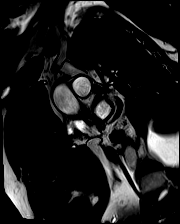

[Series 11: bSSFP · oblique · 8.0mm · 1.61mm/px · 1 of 25 slices shown (3 of 24)]
[im 1/25]
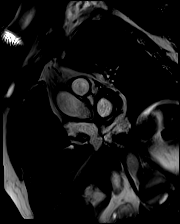

[Series 12: bSSFP · oblique · 8.0mm · 1.61mm/px · 1 of 25 slices shown (4 of 24)]
[im 1/25]
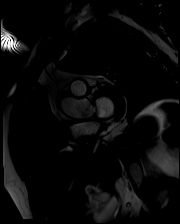

[Series 13: bSSFP · oblique · 8.0mm · 1.61mm/px · 1 of 25 slices shown (5 of 24)]
[im 1/25]
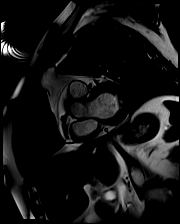

[Series 14: bSSFP · oblique · 8.0mm · 1.61mm/px · 1 of 25 slices shown (6 of 24)]
[im 1/25]
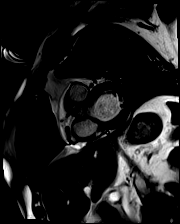

[Series 15: bSSFP · oblique · 8.0mm · 1.61mm/px · 1 of 25 slices shown (7 of 24)]
[im 1/25]
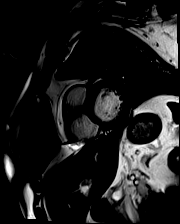

[Series 16: bSSFP · oblique · 8.0mm · 1.61mm/px · 1 of 25 slices shown (8 of 24)]
[im 1/25]
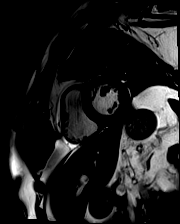

[Series 17: bSSFP · oblique · 8.0mm · 1.61mm/px · 1 of 25 slices shown (9 of 24)]
[im 1/25]
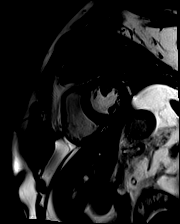

[Series 18: bSSFP · oblique · 8.0mm · 1.61mm/px · 1 of 25 slices shown (10 of 24)]
[im 1/25]
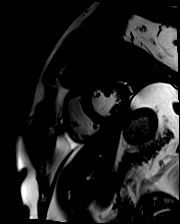

[Series 19: bSSFP · oblique · 8.0mm · 1.61mm/px · 1 of 25 slices shown (11 of 24)]
[im 1/25]
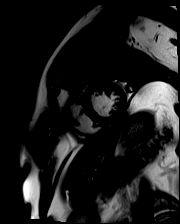

[Series 20: bSSFP · oblique · 8.0mm · 1.61mm/px · 1 of 25 slices shown (12 of 24)]
[im 1/25]
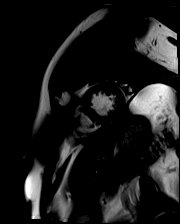

[Series 21: bSSFP · oblique · 8.0mm · 1.61mm/px · 1 of 25 slices shown (13 of 24)]
[im 1/25]
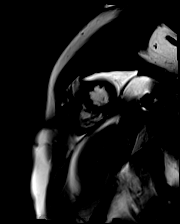

[Series 22: bSSFP · oblique · 8.0mm · 1.61mm/px · 1 of 25 slices shown (14 of 24)]
[im 1/25]
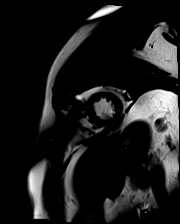

[Series 23: bSSFP · oblique · 8.0mm · 1.61mm/px · 1 of 25 slices shown (15 of 24)]
[im 1/25]
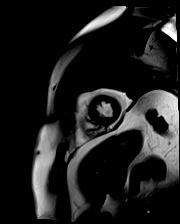

[Series 24: bSSFP · oblique · 8.0mm · 1.61mm/px · 1 of 25 slices shown (16 of 24)]
[im 1/25]
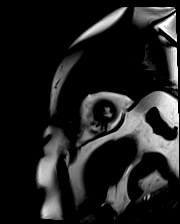

[Series 25: bSSFP · oblique · 8.0mm · 1.61mm/px · 1 of 25 slices shown (17 of 24)]
[im 1/25]
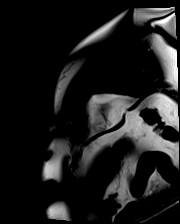

[Series 26: bSSFP · oblique · 8.0mm · 1.61mm/px · 1 of 25 slices shown (18 of 24)]
[im 1/25]
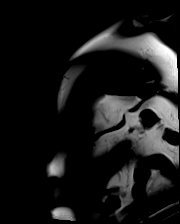

[Series 27: bSSFP · axial · 6.0mm · 1.41mm/px · 1 of 25 slices shown (19 of 24)]
[im 1/25]
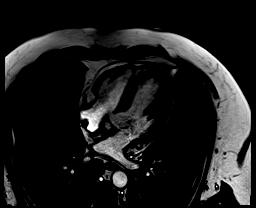

[Series 28: bSSFP · axial · 6.0mm · 1.41mm/px · 1 of 25 slices shown (20 of 24)]
[im 1/25]
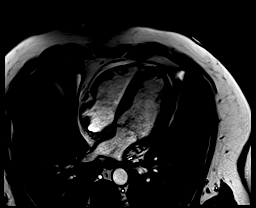

[Series 29: bSSFP · axial · 6.0mm · 1.41mm/px · 1 of 25 slices shown (21 of 24)]
[im 1/25]
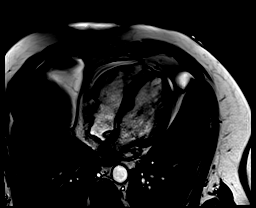

[Series 30: bSSFP · axial · 6.0mm · 1.41mm/px · 1 of 25 slices shown (22 of 24)]
[im 1/25]
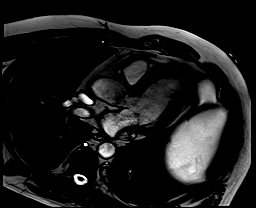

[Series 31: bSSFP · sagittal · 6.0mm · 1.41mm/px · 1 of 25 slices shown (23 of 24)]
[im 1/25]
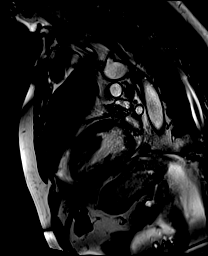

[Series 32: bSSFP · sagittal · 6.0mm · 1.41mm/px · 1 of 25 slices shown (24 of 24)]
[im 1/25]
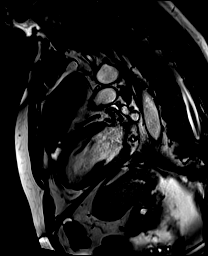

[Series 33: cine_trufi_cs_rt_short axis · axial · 4.0mm · 1.92mm/px · 1 of 45 slices shown (19 of 20)]
[im 1/45]
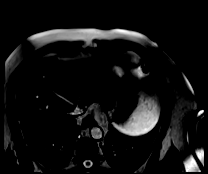

[Series 33: cine_trufi_cs_rt_short axis · axial · 4.0mm · 1.92mm/px · 1 of 45 slices shown (20 of 20)]
[im 1/45]
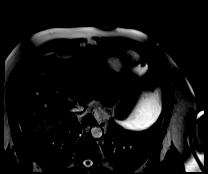

[45 of 48 positions shown; findings below may reference images not displayed]

FINDINGS: 1. Normal left ventricular size, with LVEDD 57 mm, and LVEDVi 71
mL/m2.

Asymmetric,concentric remodeling with intraventricular septal
thickness of 14 mm, posterior wall thickness of 9 mm, and myocardial
mass index of 60 g/m2.

Normal left ventricular systolic function (LVEF =64%). There are no
regional wall motion abnormalities.

There is late gadolinium enhancement in the left ventricular
myocardium. Subendocardial LGE in the basal inferior.

2.  Mild increase in right ventricular size with RVEDVI 90 mL/m2.

Normal right ventricular thickness.

Normal right ventricular systolic function (RVEF =61%). There are no
regional wall motion abnormalities or aneurysms.

3.  Normal left and right atrial size.

There is bilobar interatrial septal thickening.

There is high signal in the T1 dark blood sequence, with low signal
in fat suppression sequence.

Low signal in T2 dark blood with no change in fat suppression
sequence, but in the setting of notable artifact.

There is no late gadolinium enhancement in the interatrial septum.

Though this does not meet all criteria, most consistent with
lipomatous hypertrophy of the interatrial septum.

4. Normal size of the aortic root, ascending aorta and pulmonary
artery.

5. Valve assessment:

Aortic Valve:Tri-leaflet aortic valve. Qualitatively no significant
regurgitation.

Pulmonic Valve: Qualitatively no significant regurgitation.

Tricuspid Valve: Qualitatively no significant regurgitation.

Mitral Valve: No significant regurgitation.

6.  Normal pericardium.  No pericardial effusion.

7. Grossly, no extracardiac findings. Recommended dedicated study if
concerned for non-cardiac pathology.
IMPRESSION: Study shows evidence of most consistent with lipomatous hypertrophy
of the interatrial septum.

## 2021-05-16 MED ORDER — GADOBUTROL 1 MMOL/ML IV SOLN
10.0000 mL | Freq: Once | INTRAVENOUS | Status: AC | PRN
  Administered 2021-05-16: 18:00:00 10 mL via INTRAVENOUS

## 2021-05-16 NOTE — Progress Notes (Signed)
Consult placed for difficult stick. Upon arrival, staff on unit have already placed access. Fran Lowes, RN

## 2021-05-25 ENCOUNTER — Telehealth: Payer: Self-pay | Admitting: Interventional Cardiology

## 2021-05-25 DIAGNOSIS — H43812 Vitreous degeneration, left eye: Secondary | ICD-10-CM | POA: Diagnosis not present

## 2021-05-25 DIAGNOSIS — H25812 Combined forms of age-related cataract, left eye: Secondary | ICD-10-CM | POA: Diagnosis not present

## 2021-05-25 DIAGNOSIS — H33102 Unspecified retinoschisis, left eye: Secondary | ICD-10-CM | POA: Diagnosis not present

## 2021-05-25 DIAGNOSIS — H31092 Other chorioretinal scars, left eye: Secondary | ICD-10-CM | POA: Diagnosis not present

## 2021-05-25 NOTE — Telephone Encounter (Signed)
Left a message for Ginger to call back.   Per med rec:  They need to fax over a medical release to [phone] in order to request records.

## 2021-05-25 NOTE — Telephone Encounter (Signed)
Ginger with [facility] is requesting a copy of patient's echo, MRI, TEE, and Carotid.  Phone #: [phone] Fax #: [phone]

## 2021-06-26 ENCOUNTER — Encounter: Payer: Self-pay | Admitting: Interventional Cardiology

## 2021-07-16 NOTE — Progress Notes (Signed)
?  ?Cardiology Office Note ? ? ?Date:  07/17/2021  ? ?ID:  Shane Wood, DOB Jun 07, 1955, MRN [id number] ? ?PCP:  Colon Branch, MD  ? ? ?No chief complaint on file. ? ?Coronary artery disease ? ?Wt Readings from Last 3 Encounters:  ?07/17/21 232 lb (105.2 kg)  ?04/11/21 232 lb 8 oz (105.5 kg)  ?03/24/21 220 lb 14.4 oz (100.2 kg)  ?  ? ?  ?History of Present Illness: ?Shane Wood is a 66 y.o. male  with a history of normal ETT 01/2014 and echo showing LVEF 55 to 60% with mildly dilated a sending aorta and aortic sclerosis in 2015.  He was admitted to [facility] with NSTEMI 08/23/2017 and underwent cardiac catheterization with DES to the proximal to mid RCA and ostial PDA, residual 50% proximal LAD, 80% circumflex, total OM1 normal LVEF 50 to 55% with mild aortic stenosis.  Patient developed compartment syndrome of the right forearm post procedure and underwent exploration of the right forearm.  Radial artery was identified and intact median nerve and ulnar neurovascular bundle intact.  Wound was irrigated and loosely closed.  Follow-up with vascular surgery of as well as Dr. Burney Gauze showed that arm had healed well.  Also has history of PVCs and HLD. ?  ?He thinks he had a lot of stress in the months before the MI. MI sx were pressure and a deep ache.  ?  ?Immediately after discharge, he had some chest twinges. ?  ?He uses a Interior and spatial designer at home.  He has continued to do this during the Pandemic.  He also was going to the weight management center to help with weight loss. ?  ?Back pain has limited his exercise. ?   ? He has used a low carb diet. Intentional weight loss. He has avoided white bread and other sugars.   ?  ?Planning to retire from [employer].  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   ? ?Walking a lot.  Returned from  a [employer] in [place].   ? ?Prior to the trip, he was exercising regularly.  ? ?Denies : Chest pain. Dizziness. Leg edema. Nitroglycerin use. Orthopnea. Palpitations. Paroxysmal nocturnal  dyspnea. Shortness of breath. Syncope.   ? ?No bleeding problems.  ?  ?  ? ? ? ?Past Medical History:  ?Diagnosis Date  ? Allergy   ? BACK PAIN   ? ELEVATED BP READING WITHOUT DX HYPERTENSION 05/03/2008  ? Qualifier: Diagnosis of  By: Larose Kells MD, [place] GERD (gastroesophageal reflux disease)   ? H/O cardiovascular stress test 2005   ? (-)  ? Hyperlipidemia   ? INSOMNIA-SLEEP DISORDER-UNSPEC   ? PVC (premature ventricular contraction)   ? ? ?Past Surgical History:  ?Procedure Laterality Date  ? ARTERY REPAIR Right 08/23/2017  ? Procedure: EXPLORATION RIGHT RADIAL ARTERY,  RIGHT FOREARM FASCIOTOMY;  Surgeon: Elam Dutch, MD;  Location: [place];  Service: Vascular;  Laterality: Right;  ? BUBBLE STUDY  03/24/2021  ? Procedure: BUBBLE STUDY;  Surgeon: Sueanne Margarita, MD;  Location: [place];  Service: Cardiovascular;;  ? CHOLECYSTECTOMY    ? CORONARY STENT INTERVENTION N/A 08/23/2017  ? Procedure: CORONARY STENT INTERVENTION;  Surgeon: Jettie Booze, MD;  Location: [place] CV LAB;  Service: Cardiovascular;  Laterality: N/A;  ? LEFT HEART CATH AND CORONARY ANGIOGRAPHY N/A 08/23/2017  ? Procedure: LEFT HEART CATH AND CORONARY ANGIOGRAPHY;  Surgeon: Jettie Booze, MD;  Location: [facility] INVASIVE CV  LAB;  Service: Cardiovascular;  Laterality: N/A;  ? POLYPECTOMY    ? TEE WITHOUT CARDIOVERSION N/A 03/24/2021  ? Procedure: TRANSESOPHAGEAL ECHOCARDIOGRAM (TEE);  Surgeon: Sueanne Margarita, MD;  Location: [facility] ENDOSCOPY;  Service: Cardiovascular;  Laterality: N/A;  ? TONSILLECTOMY    ? age 80 y/o  ? ? ? ?Current Outpatient Medications  ?Medication Sig Dispense Refill  ? Ascorbic Acid (VITAMIN C) 1000 MG tablet Take 1,000 mg by mouth every evening.    ? Cholecalciferol (VITAMIN D) 125 MCG (5000 UT) CAPS Take 1 capsule by mouth in the morning.    ? clopidogrel (PLAVIX) 75 MG tablet Take 75 mg by mouth daily.    ? Cyanocobalamin (VITAMIN B 12 PO) Take 1 tablet by mouth every evening.    ? ezetimibe (ZETIA) 10 MG tablet  TAKE 1 TABLET BY MOUTH EVERY DAY 90 tablet 3  ? metoprolol tartrate (LOPRESSOR) 25 MG tablet TAKE HALF A TABLET (12.5 MG TOTAL) BY MOUTH 2 (TWO) TIMES DAILY. 90 tablet 3  ? Multiple Vitamin (MULTIVITAMIN) tablet Take 1 tablet by mouth every evening.    ? nitroGLYCERIN (NITROSTAT) 0.4 MG SL tablet PLACE 1 TABLET UNDER THE TONGUE EVERY 5 (FIVE) MINUTES X 3 DOSES AS NEEDED FOR CHEST PAIN. 25 tablet 3  ? pantoprazole (PROTONIX) 40 MG tablet TAKE 1 TABLET BY MOUTH EVERY DAY 90 tablet 3  ? rosuvastatin (CRESTOR) 40 MG tablet TAKE 1 TABLET BY MOUTH EVERYDAY AT BEDTIME 90 tablet 3  ? ?No current facility-administered medications for this visit.  ? ? ?Allergies:   Patient has no known allergies.  ? ? ?Social History:  The patient  reports that he has quit smoking. He quit smokeless tobacco use about 53 years ago.  His smokeless tobacco use included chew. He reports current alcohol use of about 4.0 standard drinks per week. He reports that he does not use drugs.  ? ?Family History:  The patient's family history includes Depression in his mother; Diabetes in his maternal grandfather and maternal grandmother; Heart attack in an other family member; Hypertension in his mother; Lung cancer in his father; Obesity in his mother; Stroke in an other family member.  ? ? ?ROS:  Please see the history of present illness.   Otherwise, review of systems are positive for weight gain.   All other systems are reviewed and negative.  ? ? ?PHYSICAL EXAM: ?VS:  BP 138/72   Pulse 80   Ht '5\' 11"'$  (1.803 m)   Wt 232 lb (105.2 kg)   SpO2 98%   BMI 32.36 kg/m?  , BMI Body mass index is 32.36 kg/m?. ?GEN: Well nourished, well developed, in no acute distress ?HEENT: normal ?Neck: no JVD, carotid bruits, or masses ?Cardiac: RRR; no murmurs, rubs, or gallops,no edema  ?Respiratory:  clear to auscultation bilaterally, normal work of breathing ?GI: soft, nontender, nondistended, + BS ?MS: no deformity or atrophy ?Skin: warm and dry, no rash ?Neuro:   Strength and sensation are intact ?Psych: euthymic mood, full affect ? ? ?EKG:   ?The ekg ordered today demonstrates  ? ? ?Recent Labs: ?03/10/2021: ALT 35 ?03/11/2021: BUN 14; Creatinine, Ser 1.02; Magnesium 2.4; Potassium 3.6; Sodium 139 ?05/09/2021: Hemoglobin 14.5; Platelets 243  ? ?Lipid Panel ?   ?Component Value Date/Time  ? CHOL 154 03/10/2021 1707  ? CHOL 150 05/14/2019 1721  ? TRIG 139 03/10/2021 1707  ? HDL 48 03/10/2021 1707  ? HDL 46 05/14/2019 1721  ? CHOLHDL 3.2 03/10/2021 1707  ? VLDL  28 03/10/2021 1707  ? [place] 78 03/10/2021 1707  ? [place] 63 02/17/2020 0733  ? LDLDIRECT 105.0 07/15/2017 0828  ? ?  ?Other studies Reviewed: ?Additional studies/ records that were reviewed today with results demonstrating: ,Total cholesterol 154, HDL 48. LDL 78, TG 139. ? ? ?ASSESSMENT AND PLAN: ? ?CAD/old MI: Residual small vessel disease noted.  Managed medically.  No bleeding on clopidogrel monotherapy.  I think this gives him the best protection long-term. ?Hyperlipidemia: Whole food, plant-based diet recommended.  High-fiber diet.  Avoid processed foods.   ?PVCs: Avoid excessive caffeine. ?Obesity: Healthy diet and regular exercise discussed. ? ? ?Current medicines are reviewed at length with the patient today.  The patient concerns regarding his medicines were addressed. ? ?The following changes have been made:  No change ? ?Labs/ tests ordered today include:  ?No orders of the defined types were placed in this encounter. ? ? ?Recommend 150 minutes/week of aerobic exercise ?Low fat, low carb, high fiber diet recommended ? ?Disposition:   FU in 1 year ? ? ?Signed, ?Larae Grooms, MD  ?07/17/2021 4:35 PM    ?[facility] ?[address], [place], [place]  [address] ?Phone: [phone]; Fax: [phone]  ? ?

## 2021-07-17 ENCOUNTER — Encounter: Payer: Self-pay | Admitting: Interventional Cardiology

## 2021-07-17 ENCOUNTER — Other Ambulatory Visit: Payer: Self-pay

## 2021-07-17 ENCOUNTER — Other Ambulatory Visit: Payer: Self-pay | Admitting: Interventional Cardiology

## 2021-07-17 ENCOUNTER — Ambulatory Visit: Payer: Medicare PPO | Admitting: Interventional Cardiology

## 2021-07-17 VITALS — BP 138/72 | HR 80 | Ht 71.0 in | Wt 232.0 lb

## 2021-07-17 DIAGNOSIS — I25118 Atherosclerotic heart disease of native coronary artery with other forms of angina pectoris: Secondary | ICD-10-CM | POA: Diagnosis not present

## 2021-07-17 DIAGNOSIS — E782 Mixed hyperlipidemia: Secondary | ICD-10-CM | POA: Diagnosis not present

## 2021-07-17 DIAGNOSIS — I779 Disorder of arteries and arterioles, unspecified: Secondary | ICD-10-CM

## 2021-07-17 DIAGNOSIS — I252 Old myocardial infarction: Secondary | ICD-10-CM | POA: Diagnosis not present

## 2021-07-17 DIAGNOSIS — I493 Ventricular premature depolarization: Secondary | ICD-10-CM

## 2021-07-17 NOTE — Patient Instructions (Signed)

## 2021-09-25 ENCOUNTER — Ambulatory Visit (INDEPENDENT_AMBULATORY_CARE_PROVIDER_SITE_OTHER): Payer: Medicare PPO

## 2021-09-25 VITALS — Ht 71.0 in | Wt 225.0 lb

## 2021-09-25 DIAGNOSIS — Z0000 Encounter for general adult medical examination without abnormal findings: Secondary | ICD-10-CM

## 2021-09-25 NOTE — Patient Instructions (Signed)
[name] , Thank you for taking time to complete your Medicare Wellness Visit. I appreciate your ongoing commitment to your health goals. Please review the following plan we discussed and let me know if I can assist you in the future.   Screening recommendations/referrals: Colonoscopy: Completed 12/19/2016-Due 12/20/2026 Recommended yearly ophthalmology/optometry visit for glaucoma screening and checkup Recommended yearly dental visit for hygiene and checkup  Vaccinations: Influenza vaccine: Up to date Pneumococcal vaccine: Up to date Tdap vaccine: Up to date Shingles vaccine: Completed vaccines   Covid-19: Up to date  Advanced directives: Please bring a copy of Living Will and/or Healthcare Power of Attorney for your chart.   Conditions/risks identified: See problem list  Next appointment: Follow up in one year for your annual wellness visit.   Preventive Care 66 Years and Older, Male Preventive care refers to lifestyle choices and visits with your health care provider that can promote health and wellness. What does preventive care include? A yearly physical exam. This is also called an annual well check. Dental exams once or twice a year. Routine eye exams. Ask your health care provider how often you should have your eyes checked. Personal lifestyle choices, including: Daily care of your teeth and gums. Regular physical activity. Eating a healthy diet. Avoiding tobacco and drug use. Limiting alcohol use. Practicing safe sex. Taking low doses of aspirin every day. Taking vitamin and mineral supplements as recommended by your health care provider. What happens during an annual well check? The services and screenings done by your health care provider during your annual well check will depend on your age, overall health, lifestyle risk factors, and family history of disease. Counseling  Your health care provider may ask you questions about your: Alcohol use. Tobacco use. Drug  use. Emotional well-being. Home and relationship well-being. Sexual activity. Eating habits. History of falls. Memory and ability to understand (cognition). Work and work Statistician. Screening  You may have the following tests or measurements: Height, weight, and BMI. Blood pressure. Lipid and cholesterol levels. These may be checked every 5 years, or more frequently if you are over 102 years old. Skin check. Lung cancer screening. You may have this screening every year starting at age 66 if you have a 30-pack-year history of smoking and currently smoke or have quit within the past 15 years. Fecal occult blood test (FOBT) of the stool. You may have this test every year starting at age 66. Flexible sigmoidoscopy or colonoscopy. You may have a sigmoidoscopy every 5 years or a colonoscopy every 10 years starting at age 66. Prostate cancer screening. Recommendations will vary depending on your family history and other risks. Hepatitis C blood test. Hepatitis B blood test. Sexually transmitted disease (STD) testing. Diabetes screening. This is done by checking your blood sugar (glucose) after you have not eaten for a while (fasting). You may have this done every 1-3 years. Abdominal aortic aneurysm (AAA) screening. You may need this if you are a current or former smoker. Osteoporosis. You may be screened starting at age 66 if you are at high risk. Talk with your health care provider about your test results, treatment options, and if necessary, the need for more tests. Vaccines  Your health care provider may recommend certain vaccines, such as: Influenza vaccine. This is recommended every year. Tetanus, diphtheria, and acellular pertussis (Tdap, Td) vaccine. You may need a Td booster every 10 years. Zoster vaccine. You may need this after age 58. Pneumococcal 13-valent conjugate (PCV13) vaccine. One dose is  recommended after age 66. Pneumococcal polysaccharide (PPSV23) vaccine. One dose is  recommended after age 78. Talk to your health care provider about which screenings and vaccines you need and how often you need them. This information is not intended to replace advice given to you by your health care provider. Make sure you discuss any questions you have with your health care provider. Document Released: 05/06/2015 Document Revised: 12/28/2015 Document Reviewed: 02/08/2015 Elsevier Interactive Patient Education  2017 [place] Prevention in the Home Falls can cause injuries. They can happen to people of all ages. There are many things you can do to make your home safe and to help prevent falls. What can I do on the outside of my home? Regularly fix the edges of walkways and driveways and fix any cracks. Remove anything that might make you trip as you walk through a door, such as a raised step or threshold. Trim any bushes or trees on the path to your home. Use bright outdoor lighting. Clear any walking paths of anything that might make someone trip, such as rocks or tools. Regularly check to see if handrails are loose or broken. Make sure that both sides of any steps have handrails. Any raised decks and porches should have guardrails on the edges. Have any leaves, snow, or ice cleared regularly. Use sand or salt on walking paths during winter. Clean up any spills in your garage right away. This includes oil or grease spills. What can I do in the bathroom? Use night lights. Install grab bars by the toilet and in the tub and shower. Do not use towel bars as grab bars. Use non-skid mats or decals in the tub or shower. If you need to sit down in the shower, use a plastic, non-slip stool. Keep the floor dry. Clean up any water that spills on the floor as soon as it happens. Remove soap buildup in the tub or shower regularly. Attach bath mats securely with double-sided non-slip rug tape. Do not have throw rugs and other things on the floor that can make you  trip. What can I do in the bedroom? Use night lights. Make sure that you have a light by your bed that is easy to reach. Do not use any sheets or blankets that are too big for your bed. They should not hang down onto the floor. Have a firm chair that has side arms. You can use this for support while you get dressed. Do not have throw rugs and other things on the floor that can make you trip. What can I do in the kitchen? Clean up any spills right away. Avoid walking on wet floors. Keep items that you use a lot in easy-to-reach places. If you need to reach something above you, use a strong step stool that has a grab bar. Keep electrical cords out of the way. Do not use floor polish or wax that makes floors slippery. If you must use wax, use non-skid floor wax. Do not have throw rugs and other things on the floor that can make you trip. What can I do with my stairs? Do not leave any items on the stairs. Make sure that there are handrails on both sides of the stairs and use them. Fix handrails that are broken or loose. Make sure that handrails are as long as the stairways. Check any carpeting to make sure that it is firmly attached to the stairs. Fix any carpet that is loose or worn. Avoid having  throw rugs at the top or bottom of the stairs. If you do have throw rugs, attach them to the floor with carpet tape. Make sure that you have a light switch at the top of the stairs and the bottom of the stairs. If you do not have them, ask someone to add them for you. What else can I do to help prevent falls? Wear shoes that: Do not have high heels. Have rubber bottoms. Are comfortable and fit you well. Are closed at the toe. Do not wear sandals. If you use a stepladder: Make sure that it is fully opened. Do not climb a closed stepladder. Make sure that both sides of the stepladder are locked into place. Ask someone to hold it for you, if possible. Clearly mark and make sure that you can  see: Any grab bars or handrails. First and last steps. Where the edge of each step is. Use tools that help you move around (mobility aids) if they are needed. These include: Canes. Walkers. Scooters. Crutches. Turn on the lights when you go into a dark area. Replace any light bulbs as soon as they burn out. Set up your furniture so you have a clear path. Avoid moving your furniture around. If any of your floors are uneven, fix them. If there are any pets around you, be aware of where they are. Review your medicines with your doctor. Some medicines can make you feel dizzy. This can increase your chance of falling. Ask your doctor what other things that you can do to help prevent falls. This information is not intended to replace advice given to you by your health care provider. Make sure you discuss any questions you have with your health care provider. Document Released: 02/03/2009 Document Revised: 09/15/2015 Document Reviewed: 05/14/2014 Elsevier Interactive Patient Education  2017 [employer].

## 2021-09-25 NOTE — Progress Notes (Addendum)
Subjective:   Shane Wood is a 66 y.o. male who presents for an Initial Medicare Annual Wellness Visit.  I connected with  toPatrickday by telephone and verified that I am speaking with the correct person using two identifiers. Location patient: home Location provider: work Persons participating in the virtual visit: patient, Marine scientist.    I discussed the limitations, risks, security and privacy concerns of performing an evaluation and management service by telephone and the availability of in person appointments. I also discussed with the patient that there may be a patient responsible charge related to this service. The patient expressed understanding and verbally consented to this telephonic visit.    Interactive audio and video telecommunications were attempted between this provider and patient, however failed, due to patient having technical difficulties OR patient did not have access to video capability.  We continued and completed visit with audio only.  Some vital signs may be absent or patient reported.   Time Spent with patient on telephone encounter: 20 minutes   Review of Systems     Cardiac Risk Factors include: advanced age (>1mn, >>81women);male gender;dyslipidemia     Objective:    Today's Vitals   09/25/21 0916  Weight: 225 lb (102.1 kg)  Height: '5\' 11"'$  (1.803 m)   Body mass index is 31.38 kg/m.     09/25/2021    9:19 AM 03/24/2021    8:17 AM 03/10/2021   12:41 PM 08/23/2017    6:30 AM 08/22/2017   10:38 PM 12/05/2016    8:17 AM  Advanced Directives  Does Patient Have a Medical Advance Directive? Yes No No  No No  Type of Advance Directive HHalseyin Chart? No - copy requested       Would patient like information on creating a medical advance directive?   No - Patient declined No - Patient declined      Current Medications (verified) Outpatient Encounter Medications as of 09/25/2021   Medication Sig   Ascorbic Acid (VITAMIN C) 1000 MG tablet Take 1,000 mg by mouth every evening.   BioGaia Probiotic (BIOGAIA/GERBER SOOTHE) LIQD Take 5 drops by mouth daily at 8 pm.   Cholecalciferol (VITAMIN D) 125 MCG (5000 UT) CAPS Take 1 capsule by mouth in the morning.   clopidogrel (PLAVIX) 75 MG tablet TAKE 1 TABLET BY MOUTH EVERY DAY   Cyanocobalamin (VITAMIN B 12 PO) Take 1 tablet by mouth every evening.   ezetimibe (ZETIA) 10 MG tablet TAKE 1 TABLET BY MOUTH EVERY DAY   metoprolol tartrate (LOPRESSOR) 25 MG tablet TAKE HALF A TABLET BY MOUTH 2 TIMES DAILY.   Multiple Vitamin (MULTIVITAMIN) tablet Take 1 tablet by mouth every evening.   nitroGLYCERIN (NITROSTAT) 0.4 MG SL tablet PLACE 1 TABLET UNDER THE TONGUE EVERY 5 (FIVE) MINUTES X 3 DOSES AS NEEDED FOR CHEST PAIN.   pantoprazole (PROTONIX) 40 MG tablet TAKE 1 TABLET BY MOUTH EVERY DAY   rosuvastatin (CRESTOR) 40 MG tablet TAKE 1 TABLET BY MOUTH EVERYDAY AT BEDTIME   No facility-administered encounter medications on file as of 09/25/2021.    Allergies (verified) Patient has no known allergies.   History: Past Medical History:  Diagnosis Date   Allergy    BACK PAIN    ELEVATED BP READING WITHOUT DX HYPERTENSION 05/03/2008   Qualifier: Diagnosis of  By: PLarose KellsMD, JAlda Berthold    GERD (gastroesophageal reflux disease)  H/O cardiovascular stress test 2005    (-)   Hyperlipidemia    INSOMNIA-SLEEP DISORDER-UNSPEC    PVC (premature ventricular contraction)    Past Surgical History:  Procedure Laterality Date   ARTERY REPAIR Right 08/23/2017   Procedure: EXPLORATION RIGHT RADIAL ARTERY,  RIGHT FOREARM FASCIOTOMY;  Surgeon: Elam Dutch, MD;  Location: [facility];  Service: Vascular;  Laterality: Right;   BUBBLE STUDY  03/24/2021   Procedure: BUBBLE STUDY;  Surgeon: Sueanne Margarita, MD;  Location: [facility];  Service: Cardiovascular;;   CHOLECYSTECTOMY     CORONARY STENT INTERVENTION N/A 08/23/2017   Procedure: CORONARY STENT  INTERVENTION;  Surgeon: Jettie Booze, MD;  Location: [facility];  Service: Cardiovascular;  Laterality: N/A;   LEFT HEART CATH AND CORONARY ANGIOGRAPHY N/A 08/23/2017   Procedure: LEFT HEART CATH AND CORONARY ANGIOGRAPHY;  Surgeon: Jettie Booze, MD;  Location: [facility];  Service: Cardiovascular;  Laterality: N/A;   POLYPECTOMY     TEE WITHOUT CARDIOVERSION N/A 03/24/2021   Procedure: TRANSESOPHAGEAL ECHOCARDIOGRAM (TEE);  Surgeon: Sueanne Margarita, MD;  Location: [facility];  Service: Cardiovascular;  Laterality: N/A;   TONSILLECTOMY     age 62 y/o   Family History  Problem Relation Age of Onset   Lung cancer Father        smoker   Diabetes Maternal Grandmother    Diabetes Maternal Grandfather    Hypertension Mother    Depression Mother    Obesity Mother    Heart attack Other        uncle MI at age 41s   Stroke Other        GM, in her 80   Colon cancer Neg Hx    Prostate cancer Neg Hx    Colon polyps Neg Hx    Esophageal cancer Neg Hx    Rectal cancer Neg Hx    Stomach cancer Neg Hx    Pancreatic cancer Neg Hx    Social History   Socioeconomic History   Marital status: Married    Spouse name: [name]   Number of children: 2   Years of education: Not on file   Highest education level: Not on file  Occupational History   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  Tobacco Use   Smoking status: Former   Smokeless tobacco: Former    Types: Chew    Quit date: 1970   Tobacco comments:    1 ppd , quit 1989  Vaping Use   Vaping Use: Never used  Substance and Sexual Activity   Alcohol use: Yes    Alcohol/week: 4.0 standard drinks    Types: 4 Glasses of wine per week    Comment: occasionally on weekends   Drug use: No   Sexual activity: Not on file  Other Topics Concern   Not on file  Social History Narrative   Household pt and wife    2 adult married children   Social Determinants of Health   Financial  Resource Strain: Low Risk    Difficulty of Paying Living Expenses: Not hard at all  Food Insecurity: No Food Insecurity   Worried About Charity fundraiser in the Last Year: Never true   Arboriculturist in the Last Year: Never true  Transportation Needs: No Transportation Needs   Lack of Transportation (Medical): No   Lack of Transportation (Non-Medical): No  Physical Activity: Sufficiently Active   Days  of Exercise per Week: 7 days   Minutes of Exercise per Session: 30 min  Stress: No Stress Concern Present   Feeling of Stress : Only a little  Social Connections: Engineer, building services of Communication with Friends and Family: More than three times a week   Frequency of Social Gatherings with Friends and Family: More than three times a week   Attends Religious Services: More than 4 times per year   Active Member of [employer] or Organizations: Yes   Attends Archivist Meetings: 1 to 4 times per year   Marital Status: Married    Tobacco Counseling Counseling given: Not Answered Tobacco comments: 1 ppd , quit 1989   Clinical Intake:  Pre-visit preparation completed: Yes  Pain : No/denies pain     BMI - recorded: 31.38 Nutritional Status: BMI > 30  Obese Nutritional Risks: None Diabetes: No  How often do you need to have someone help you when you read instructions, pamphlets, or other written materials from your doctor or pharmacy?: 1 - Never  Diabetic?No  Interpreter Needed?: No  Information entered by :: Caroleen Hamman LPN   Activities of Daily Living    09/25/2021    9:22 AM 01/05/2021    8:05 AM  In your present state of health, do you have any difficulty performing the following activities:  Hearing? 0 0  Vision? 0 0  Difficulty concentrating or making decisions? 0 0  Walking or climbing stairs? 0 0  Dressing or bathing? 0 0  Doing errands, shopping? 0 0  Preparing Food and eating ? N   Using the Toilet? N   In the past six months, have you  accidently leaked urine? N   Do you have problems with loss of bowel control? N   Managing your Medications? N   Managing your Finances? N   Housekeeping or managing your Housekeeping? N     Patient Care Team: Colon Branch, MD as PCP - General Jettie Booze, MD as PCP - Cardiology (Cardiology) Josue Hector, MD as Consulting Physician (Cardiology) Shawnie Dapper, DO as Consulting Physician (Optometry)  Indicate any recent Medical Services you may have received from other than [facility] providers in the past year (date may be approximate).     Assessment:   This is a routine wellness examination for Eulises.  Hearing/Vision screen Hearing Screening - Comments:: No issues Vision Screening - Comments:: Last eye exam-02/2021-Dr. Jerline Pain  Dietary issues and exercise activities discussed: Current Exercise Habits: Home exercise routine, Type of exercise: walking;strength training/weights, Time (Minutes): 30, Frequency (Times/Week): 7, Weekly Exercise (Minutes/Week): 210, Intensity: Mild, Exercise limited by: None identified   Goals Addressed             This Visit's Progress    Patient Stated       Increase exercise & lose some weight       Depression Screen    09/25/2021    9:21 AM 04/11/2021   11:09 AM 12/13/2020    4:06 PM 01/01/2020    1:49 PM 12/22/2018    1:44 PM 12/12/2017    7:19 AM 08/08/2017    9:10 AM  PHQ 2/9 Scores  PHQ - 2 Score 0 0 0 0 0 0 2  PHQ- 9 Score       9    Fall Risk    09/25/2021    9:20 AM 04/11/2021   11:09 AM 01/05/2021    8:06 AM 12/13/2020  4:06 PM 10/31/2017    8:11 AM  Fall Risk   Falls in the past year? 0 0 0 0 No  Number falls in past yr: 0 0 0 0   Injury with Fall? 0 0 0 0   Follow up Falls prevention discussed Falls evaluation completed Falls evaluation completed Falls evaluation completed     [place]:  Any stairs in or around the home? Yes  If so, are there any without handrails? No  Home  free of loose throw rugs in walkways, pet beds, electrical cords, etc? Yes  Adequate lighting in your home to reduce risk of falls? Yes   ASSISTIVE DEVICES UTILIZED TO PREVENT FALLS:  Life alert? No  Use of a cane, walker or w/c? No  Grab bars in the bathroom? No  Shower chair or bench in shower? No  Elevated toilet seat or a handicapped toilet? No   TIMED UP AND GO:  Was the test performed? No . Phone visit   Cognitive Function:Normal cognitive status assessed by this Nurse Health Advisor. No abnormalities found.          Immunizations Immunization History  Administered Date(s) Administered   Fluad Quad(high Dose 65+) 01/05/2021   Influenza Split 12/28/2017   Influenza Whole 01/21/2010   Influenza,inj,Quad PF,6+ Mos 12/22/2018   Influenza-Unspecified 01/30/2015, 04/07/2016, 12/29/2016, 02/07/2020   Moderna Sars-Covid-2 Vaccination 10/16/2020   PFIZER(Purple Top)SARS-COV-2 Vaccination 07/03/2019, 07/29/2019, 02/21/2020   PNEUMOCOCCAL CONJUGATE-20 01/05/2021   Pfizer Covid-19 Vaccine Bivalent Booster 6yr & up 02/21/2021   Pneumococcal Polysaccharide-23 12/22/2018   Td 07/06/2005   Tdap 07/08/2015   Zoster Recombinat (Shingrix) 01/01/2020, 03/04/2020   Zoster, Live 07/08/2015    TDAP status: Up to date  Flu Vaccine status: Up to date  Pneumococcal vaccine status: Up to date  Covid-19 vaccine status: Completed vaccines  Qualifies for Shingles Vaccine? No   Zostavax completed Yes   Shingrix Completed?: Yes  Screening Tests Health Maintenance  Topic Date Due   INFLUENZA VACCINE  11/21/2021   TETANUS/TDAP  07/07/2025   COLONOSCOPY (Pts 45-434yrInsurance coverage will need to be confirmed)  12/20/2026   Pneumonia Vaccine 6542Years old  Completed   COVID-19 Vaccine  Completed   Hepatitis C Screening  Completed   Zoster Vaccines- Shingrix  Completed   HPV VACCINES  Aged Out    Health Maintenance  There are no preventive care reminders to display for this  patient.   Colorectal cancer screening: Type of screening: Colonoscopy. Completed 12/19/2016. Repeat every 10 years  Lung Cancer Screening: (Low Dose CT Chest recommended if Age 767-80ears, 30 pack-year currently smoking OR have quit w/in 15years.) does not qualify.     Additional Screening:  Hepatitis C Screening: Completed 07/10/2016  Vision Screening: Recommended annual ophthalmology exams for early detection of glaucoma and other disorders of the eye. Is the patient up to date with their annual eye exam?  Yes  Who is the provider or what is the name of the office in which the patient attends annual eye exams? Dr. PaJerline PainDental Screening: Recommended annual dental exams for proper oral hygiene  Community Resource Referral / Chronic Care Management: CRR required this visit?  No   CCM required this visit?  No      Plan:     I have personally reviewed and noted the following in the patient's chart:   Medical and social history Use of alcohol, tobacco or illicit drugs  Current medications  and supplements including opioid prescriptions. Patient is not currently taking opioid prescriptions. Functional ability and status Nutritional status Physical activity Advanced directives List of other physicians Hospitalizations, surgeries, and ER visits in previous 12 months Vitals Screenings to include cognitive, depression, and falls Referrals and appointments  In addition, I have reviewed and discussed with patient certain preventive protocols, quality metrics, and best practice recommendations. A written personalized care plan for preventive services as well as general preventive health recommendations were provided to patient.   Due to this being a telephonic visit, the after visit summary with patients personalized plan was offered to patient via mail or my-chart. Patient would like to access on my-chart.   Marta Antu, LPN   10/25/7320  Nurse Health Advisor  Nurse  Notes: None   I have reviewed and agree with Health Coaches documentation.  Kathlene November, MD

## 2021-11-29 ENCOUNTER — Encounter (INDEPENDENT_AMBULATORY_CARE_PROVIDER_SITE_OTHER): Payer: Self-pay

## 2021-12-14 ENCOUNTER — Encounter: Payer: Self-pay | Admitting: Internal Medicine

## 2022-01-05 DIAGNOSIS — R202 Paresthesia of skin: Secondary | ICD-10-CM | POA: Insufficient documentation

## 2022-01-08 ENCOUNTER — Encounter: Payer: Self-pay | Admitting: Internal Medicine

## 2022-01-08 ENCOUNTER — Ambulatory Visit (INDEPENDENT_AMBULATORY_CARE_PROVIDER_SITE_OTHER): Payer: Medicare PPO | Admitting: Internal Medicine

## 2022-01-08 VITALS — BP 122/78 | HR 54 | Temp 97.9°F | Resp 18 | Ht 71.0 in | Wt 236.1 lb

## 2022-01-08 DIAGNOSIS — E782 Mixed hyperlipidemia: Secondary | ICD-10-CM | POA: Diagnosis not present

## 2022-01-08 DIAGNOSIS — R739 Hyperglycemia, unspecified: Secondary | ICD-10-CM

## 2022-01-08 DIAGNOSIS — Z0000 Encounter for general adult medical examination without abnormal findings: Secondary | ICD-10-CM | POA: Diagnosis not present

## 2022-01-08 LAB — COMPREHENSIVE METABOLIC PANEL
ALT: 31 U/L (ref 0–53)
AST: 24 U/L (ref 0–37)
Albumin: 4.2 g/dL (ref 3.5–5.2)
Alkaline Phosphatase: 48 U/L (ref 39–117)
BUN: 17 mg/dL (ref 6–23)
CO2: 24 mEq/L (ref 19–32)
Calcium: 9 mg/dL (ref 8.4–10.5)
Chloride: 107 mEq/L (ref 96–112)
Creatinine, Ser: 1.07 mg/dL (ref 0.40–1.50)
GFR: 72.3 mL/min (ref >60.00)
Glucose, Bld: 92 mg/dL (ref 70–99)
Potassium: 4.2 mEq/L (ref 3.5–5.1)
Sodium: 140 mEq/L (ref 135–145)
Total Bilirubin: 0.6 mg/dL (ref 0.2–1.2)
Total Protein: 6.7 g/dL (ref 6.0–8.3)

## 2022-01-08 LAB — PSA: PSA: 1.19 ng/mL (ref 0.10–4.00)

## 2022-01-08 LAB — LIPID PANEL
Cholesterol: 135 mg/dL (ref 0–200)
HDL: 44.7 mg/dL (ref >39.00)
LDL Cholesterol: 51 mg/dL (ref 0–99)
NonHDL: 90.11
Total CHOL/HDL Ratio: 3
Triglycerides: 196 mg/dL — ABNORMAL HIGH (ref 0.0–149.0)
VLDL: 39.2 mg/dL (ref 0.0–40.0)

## 2022-01-08 LAB — HEMOGLOBIN A1C: Hgb A1c MFr Bld: 5.8 % (ref 4.6–6.5)

## 2022-01-08 NOTE — Assessment & Plan Note (Signed)
-  Td 2017 - s/p zostavax ; s/p  shingrix:   - C-19 shots rec booster   - PNM 23: 11/2018 - PNM 20: 2022 - plans to get RSV shot  - had a flu shot last week --CCS:  Colonoscopy 09/2010, cscope again 11-2016, next 10 years per GI letter Dr. Ardis Hughs.   --Prostate ca screening: DRE normal, no symptoms, check PSA --Exercise,diet: Takes walks daily, stationary bike.  Doing well.  Recommend to watch diet - labs CMP FLP A1c PSA - POA: See AVS

## 2022-01-08 NOTE — Progress Notes (Signed)
Subjective:    Patient ID: Shane Wood, male    DOB: [birth year], 66 y.o.   MRN: [id number]  DOS:  01/08/2022 Type of visit - description: cpx  Since the last office visit is doing okay. Has no major concerns. Had a L Branch retinal artery occlusion still has  some visual distortions  Review of Systems  Other than above, a 14 point review of systems is negative     Past Medical History:  Diagnosis Date   Allergy    BACK PAIN    ELEVATED BP READING WITHOUT DX HYPERTENSION 05/03/2008   Qualifier: Diagnosis of  By: Larose Kells MD, Alda Berthold.    GERD (gastroesophageal reflux disease)    H/O cardiovascular stress test 2005    (-)   Hyperlipidemia    INSOMNIA-SLEEP DISORDER-UNSPEC    PVC (premature ventricular contraction)     Past Surgical History:  Procedure Laterality Date   ARTERY REPAIR Right 08/23/2017   Procedure: EXPLORATION RIGHT RADIAL ARTERY,  RIGHT FOREARM FASCIOTOMY;  Surgeon: Elam Dutch, MD;  Location: [facility];  Service: Vascular;  Laterality: Right;   BUBBLE STUDY  03/24/2021   Procedure: BUBBLE STUDY;  Surgeon: Sueanne Margarita, MD;  Location: [facility];  Service: Cardiovascular;;   CHOLECYSTECTOMY     CORONARY STENT INTERVENTION N/A 08/23/2017   Procedure: CORONARY STENT INTERVENTION;  Surgeon: Jettie Booze, MD;  Location: [facility];  Service: Cardiovascular;  Laterality: N/A;   LEFT HEART CATH AND CORONARY ANGIOGRAPHY N/A 08/23/2017   Procedure: LEFT HEART CATH AND CORONARY ANGIOGRAPHY;  Surgeon: Jettie Booze, MD;  Location: [facility];  Service: Cardiovascular;  Laterality: N/A;   POLYPECTOMY     TEE WITHOUT CARDIOVERSION N/A 03/24/2021   Procedure: TRANSESOPHAGEAL ECHOCARDIOGRAM (TEE);  Surgeon: Sueanne Margarita, MD;  Location: [facility];  Service: Cardiovascular;  Laterality: N/A;   TONSILLECTOMY     age 45 y/o    Social History   Socioeconomic History   Marital status: Married    Spouse name: [name]   Number of children: 2    Years of education: Not on file   Highest education level: Not on file  Occupational History   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  Tobacco Use   Smoking status: Former   Smokeless tobacco: Former    Types: Chew    Quit date: 1970   Tobacco comments:    1 ppd , quit 1989  Vaping Use   Vaping Use: Never used  Substance and Sexual Activity   Alcohol use: Yes    Alcohol/week: 4.0 standard drinks of alcohol    Types: 4 Glasses of wine per week    Comment: occasionally on weekends   Drug use: No   Sexual activity: Not on file  Other Topics Concern   Not on file  Social History Narrative   Household pt and wife    2 adult married children   Social Determinants of Health   Financial Resource Strain: Low Risk  (09/25/2021)   Overall Financial Resource Strain (CARDIA)    Difficulty of Paying Living Expenses: Not hard at all  Food Insecurity: No Food Insecurity (09/25/2021)   Hunger Vital Sign    Worried About Running Out of Food in the Last Year: Never true    Ran Out of Food in the Last Year: Never true  Transportation Needs: No Transportation Needs (09/25/2021)   PRAPARE - Transportation    Lack  of Transportation (Medical): No    Lack of Transportation (Non-Medical): No  Physical Activity: Sufficiently Active (09/25/2021)   Exercise Vital Sign    Days of Exercise per Week: 7 days    Minutes of Exercise per Session: 30 min  Stress: No Stress Concern Present (09/25/2021)   [place]    Feeling of Stress : Only a little  Social Connections: Socially Integrated (09/25/2021)   Social Connection and Isolation Panel [NHANES]    Frequency of Communication with Friends and Family: More than three times a week    Frequency of Social Gatherings with Friends and Family: More than three times a week    Attends Religious Services: More than 4 times per year    Active Member of [employer] or Organizations:  Yes    Attends Archivist Meetings: 1 to 4 times per year    Marital Status: Married  Human resources officer Violence: Not At Risk (09/25/2021)   Humiliation, Afraid, Rape, and Kick questionnaire    Fear of Current or Ex-Partner: No    Emotionally Abused: No    Physically Abused: No    Sexually Abused: No     Current Outpatient Medications  Medication Instructions   Cholecalciferol (VITAMIN D) 125 MCG (5000 UT) CAPS 1 capsule, Oral, Every morning   clopidogrel (PLAVIX) 75 MG tablet TAKE 1 TABLET BY MOUTH EVERY DAY   Cyanocobalamin (VITAMIN B 12 PO) 1 tablet, Oral, Every evening   ezetimibe (ZETIA) 10 MG tablet TAKE 1 TABLET BY MOUTH EVERY DAY   metoprolol tartrate (LOPRESSOR) 25 MG tablet TAKE HALF A TABLET BY MOUTH 2 TIMES DAILY.   Multiple Vitamin (MULTIVITAMIN) tablet 1 tablet, Oral, Every evening,     nitroGLYCERIN (NITROSTAT) 0.4 MG SL tablet PLACE 1 TABLET UNDER THE TONGUE EVERY 5 (FIVE) MINUTES X 3 DOSES AS NEEDED FOR CHEST PAIN.   pantoprazole (PROTONIX) 40 MG tablet TAKE 1 TABLET BY MOUTH EVERY DAY   Probiotic Product (PROBIOTIC DAILY PO) Oral   rosuvastatin (CRESTOR) 40 MG tablet TAKE 1 TABLET BY MOUTH EVERYDAY AT BEDTIME   vitamin C 1,000 mg, Oral, Every evening       Objective:   Physical Exam  BP 122/78   Pulse (!) 54   Temp 97.9 F (36.6 C) (Oral)   Resp 18   Ht '5\' 11"'$  (1.803 m)   Wt 236 lb 2 oz (107.1 kg)   SpO2 98%   BMI 32.93 kg/m  General: Well developed, NAD, BMI noted Neck: No  thyromegaly  HEENT:  Normocephalic . Face symmetric, atraumatic Lungs:  CTA B Normal respiratory effort, no intercostal retractions, no accessory muscle use. Heart: RRR,  no murmur.  Abdomen:  Not distended, soft, non-tender. No rebound or rigidity.   Lower extremities: no pretibial edema bilaterally  DRE: Normal sphincter tone, brown stools, prostate normal Skin: Exposed areas without rash. Not pale. Not jaundice Neurologic:  alert & oriented X3.  Speech normal,  gait appropriate for age and unassisted Strength symmetric and appropriate for age.  Psych: Cognition and judgment appear intact.  Cooperative with normal attention span and concentration.  Behavior appropriate. No anxious or depressed appearing.     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  Checking labs CAD: asx, most recent visit with cardiology 06-2021.  Continue present care, recommend to refresh nitroglycerin every few months. Elevated BP: Recommend to check ambulatory BPs PVCs, rarely have symptoms, typically stress related. No CP-SOB RTC 1 year

## 2022-01-08 NOTE — Patient Instructions (Signed)
Please see your eye doctor regularlyCheck the  blood pressure regularly  BP GOAL is between 110/65 and  135/85. If it is consistently higher or lower, let me know    GO TO THE LAB : Get the blood work     [place], [place] back for physical exam in 1 year     "Living will", "[place] of attorney": Advanced care planning  (If you already have a living will or healthcare power of attorney, please bring the copy to be scanned in your chart.)  Advance care planning is a process that supports adults in  understanding and sharing their preferences regarding future medical care.   The patient's preferences are recorded in documents called Advance Directives.    Advanced directives are completed (and can be modified at any time) while the patient is in full mental capacity.   The documentation should be available at all times to the patient, the family and the healthcare providers.  Bring in a copy to be scanned in your chart is an excellent idea and is recommended   This legal documents direct treatment decision making and/or appoint a surrogate to make the decision if the patient is not capable to do so.    Advance directives can be documented in many types of formats,  documents have names such as:  Lliving will  Durable power of attorney for healthcare (healthcare proxy or healthcare power of attorney)  Combined directives  Physician orders for life-sustaining treatment    More information at:  [email or web address]

## 2022-01-08 NOTE — Assessment & Plan Note (Signed)
Here for CPX Hyperlipidemia: On Zetia and Crestor.  Checking labs CAD: asx, most recent visit with cardiology 06-2021.  Continue present care, recommend to refresh nitroglycerin every few months. Elevated BP: Recommend to check ambulatory BPs PVCs, rarely have symptoms, typically stress related. No CP-SOB RTC 1 year

## 2022-02-27 ENCOUNTER — Other Ambulatory Visit: Payer: Self-pay | Admitting: Internal Medicine

## 2022-03-28 ENCOUNTER — Ambulatory Visit (HOSPITAL_COMMUNITY)
Admission: RE | Admit: 2022-03-28 | Discharge: 2022-03-28 | Disposition: A | Discharge status: Home / Self Care | Payer: Medicare PPO | Source: Ambulatory Visit | Attending: Cardiology | Admitting: Cardiology

## 2022-03-28 DIAGNOSIS — I779 Disorder of arteries and arterioles, unspecified: Secondary | ICD-10-CM | POA: Insufficient documentation

## 2022-03-29 ENCOUNTER — Other Ambulatory Visit: Payer: Self-pay | Admitting: *Deleted

## 2022-03-29 DIAGNOSIS — I779 Disorder of arteries and arterioles, unspecified: Secondary | ICD-10-CM

## 2022-04-04 DIAGNOSIS — H43813 Vitreous degeneration, bilateral: Secondary | ICD-10-CM | POA: Diagnosis not present

## 2022-04-04 DIAGNOSIS — H34212 Partial retinal artery occlusion, left eye: Secondary | ICD-10-CM | POA: Diagnosis not present

## 2022-04-04 DIAGNOSIS — H2513 Age-related nuclear cataract, bilateral: Secondary | ICD-10-CM | POA: Diagnosis not present

## 2022-04-04 DIAGNOSIS — H18213 Corneal edema secondary to contact lens, bilateral: Secondary | ICD-10-CM | POA: Diagnosis not present

## 2022-04-15 ENCOUNTER — Other Ambulatory Visit: Payer: Self-pay | Admitting: Interventional Cardiology

## 2022-05-28 DIAGNOSIS — H34232 Retinal artery branch occlusion, left eye: Secondary | ICD-10-CM | POA: Diagnosis not present

## 2022-05-28 DIAGNOSIS — H31092 Other chorioretinal scars, left eye: Secondary | ICD-10-CM | POA: Diagnosis not present

## 2022-05-28 DIAGNOSIS — H25812 Combined forms of age-related cataract, left eye: Secondary | ICD-10-CM | POA: Diagnosis not present

## 2022-05-28 DIAGNOSIS — H33102 Unspecified retinoschisis, left eye: Secondary | ICD-10-CM | POA: Diagnosis not present

## 2022-07-14 ENCOUNTER — Other Ambulatory Visit: Payer: Self-pay | Admitting: Interventional Cardiology

## 2022-07-15 ENCOUNTER — Other Ambulatory Visit: Payer: Self-pay | Admitting: Internal Medicine

## 2022-07-24 NOTE — Progress Notes (Unsigned)
Cardiology Office Note   Date:  07/25/2022   ID:  Shane Wood, DOB 07-23-1955, MRN [id number]  PCP:  Colon Branch, MD    No chief complaint on file.  CAD  Wt Readings from Last 3 Encounters:  07/25/22 233 lb 6.4 oz (105.9 kg)  01/08/22 236 lb 2 oz (107.1 kg)  09/25/21 225 lb (102.1 kg)       History of Present Illness: Shane Wood is a 67 y.o. male  with a history of normal ETT 01/2014 and echo showing LVEF 55 to 60% with mildly dilated a sending aorta and aortic sclerosis in 2015.  He was admitted to [facility] with NSTEMI 08/23/2017 and underwent cardiac catheterization with DES to the proximal to mid RCA and ostial PDA, residual 50% proximal LAD, 80% circumflex, total OM1 normal LVEF 50 to 55% with mild aortic stenosis.  Patient developed compartment syndrome of the right forearm post procedure and underwent exploration of the right forearm.  Radial artery was identified and intact median nerve and ulnar neurovascular bundle intact.  Wound was irrigated and loosely closed.  Follow-up with vascular surgery of as well as Dr. Burney Gauze showed that arm had healed well.  Also has history of PVCs and HLD.   He thinks he had a lot of stress in the months before the MI. MI sx were pressure and a deep ache.    Immediately after discharge, he had some chest twinges.   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  He has continued to do this during the Pandemic.  He also was going to the weight management center to help with weight loss.   Back pain has limited his exercise.     He has used a low carb diet. Intentional weight loss. He has avoided white bread and other sugars.     Retired from [employer].  Now working as a Garment/textile technologist.    Denies : Chest pain. Dizziness. Leg edema. Nitroglycerin use. Orthopnea. Palpitations. Paroxysmal nocturnal dyspnea. Shortness of breath. Syncope.    Does some weights and his cardio.  Does some cardio as well.  Limits sugary sodas.  Does drink of scotch in the  afternoon.  He has found in the past that he has to limit himself to 1500 cal if he is going to lose weight.  He has gotten all the way down to 180 pounds in the past but has put the weight back on.   Past Medical History:  Diagnosis Date   Allergy    BACK PAIN    ELEVATED BP READING WITHOUT DX HYPERTENSION 05/03/2008   Qualifier: Diagnosis of  By: Larose Kells MD, Alda Berthold.    GERD (gastroesophageal reflux disease)    H/O cardiovascular stress test 2005    (-)   Hyperlipidemia    INSOMNIA-SLEEP DISORDER-UNSPEC    PVC (premature ventricular contraction)     Past Surgical History:  Procedure Laterality Date   ARTERY REPAIR Right 08/23/2017   Procedure: EXPLORATION RIGHT RADIAL ARTERY,  RIGHT FOREARM FASCIOTOMY;  Surgeon: Elam Dutch, MD;  Location: [place];  Service: Vascular;  Laterality: Right;   BUBBLE STUDY  03/24/2021   Procedure: BUBBLE STUDY;  Surgeon: Sueanne Margarita, MD;  Location: [place];  Service: Cardiovascular;;   CHOLECYSTECTOMY     CORONARY STENT INTERVENTION N/A 08/23/2017   Procedure: CORONARY STENT INTERVENTION;  Surgeon: Jettie Booze, MD;  Location: [place] CV LAB;  Service: Cardiovascular;  Laterality: N/A;  LEFT HEART CATH AND CORONARY ANGIOGRAPHY N/A 08/23/2017   Procedure: LEFT HEART CATH AND CORONARY ANGIOGRAPHY;  Surgeon: Jettie Booze, MD;  Location: [place] CV LAB;  Service: Cardiovascular;  Laterality: N/A;   POLYPECTOMY     TEE WITHOUT CARDIOVERSION N/A 03/24/2021   Procedure: TRANSESOPHAGEAL ECHOCARDIOGRAM (TEE);  Surgeon: Sueanne Margarita, MD;  Location: [facility] ENDOSCOPY;  Service: Cardiovascular;  Laterality: N/A;   TONSILLECTOMY     age 63 y/o     Current Outpatient Medications  Medication Sig Dispense Refill   Ascorbic Acid (VITAMIN C) 1000 MG tablet Take 1,000 mg by mouth every evening.     B Complex Vitamins (VITAMIN B-COMPLEX) TABS Take 1 tablet by mouth daily.     Cholecalciferol (VITAMIN D) 125 MCG (5000 UT) CAPS Take 1 capsule by  mouth in the morning.     clopidogrel (PLAVIX) 75 MG tablet TAKE 1 TABLET BY MOUTH EVERY DAY 90 tablet 0   Cyanocobalamin (VITAMIN B 12 PO) Take 1 tablet by mouth every evening.     ezetimibe (ZETIA) 10 MG tablet TAKE 1 TABLET BY MOUTH EVERY DAY 90 tablet 3   metoprolol tartrate (LOPRESSOR) 25 MG tablet TAKE HALF A TABLET BY MOUTH 2 TIMES DAILY. 90 tablet 3   Multiple Vitamin (MULTIVITAMIN) tablet Take 1 tablet by mouth every evening.     nitroGLYCERIN (NITROSTAT) 0.4 MG SL tablet PLACE 1 TABLET UNDER THE TONGUE EVERY 5 (FIVE) MINUTES X 3 DOSES AS NEEDED FOR CHEST PAIN. 25 tablet 3   pantoprazole (PROTONIX) 40 MG tablet TAKE 1 TABLET BY MOUTH EVERY DAY 90 tablet 3   Probiotic Product (PROBIOTIC DAILY PO) Take by mouth.     rosuvastatin (CRESTOR) 40 MG tablet Take 1 tablet (40 mg total) by mouth at bedtime. 90 tablet 2   No current facility-administered medications for this visit.    Allergies:   Patient has no known allergies.    Social History:  The patient  reports that he has quit smoking. He quit smokeless tobacco use about 54 years ago.  His smokeless tobacco use included chew. He reports current alcohol use of about 4.0 standard drinks of alcohol per week. He reports that he does not use drugs.   Family History:  The patient's family history includes Depression in his mother; Diabetes in his maternal grandfather and maternal grandmother; Heart attack in an other family member; Hypertension in his mother; Lung cancer in his father; Obesity in his mother; Stroke in an other family member.    ROS:  Please see the history of present illness.   Otherwise, review of systems are positive for difficulty losing weight.   All other systems are reviewed and negative.    PHYSICAL EXAM: VS:  BP 136/84   Pulse 80   Ht 5\' 11"  (1.803 m)   Wt 233 lb 6.4 oz (105.9 kg)   SpO2 97%   BMI 32.55 kg/m  , BMI Body mass index is 32.55 kg/m. GEN: Well nourished, well developed, in no acute  distress HEENT: normal Neck: no JVD, carotid bruits, or masses Cardiac: RRR; no murmurs, rubs, or gallops,no edema  Respiratory:  clear to auscultation bilaterally, normal work of breathing GI: soft, nontender, nondistended, + BS MS: no deformity or atrophy Skin: warm and dry, no rash Neuro:  Strength and sensation are intact Psych: euthymic mood, full affect   EKG:   The ekg ordered today demonstrates NSR, no ST changes   Recent Labs: 01/08/2022: ALT 31; BUN 17;  Creatinine, Ser 1.07; Potassium 4.2; Sodium 140   Lipid Panel    Component Value Date/Time   CHOL 135 01/08/2022 0827   CHOL 150 05/14/2019 1721   TRIG 196.0 (H) 01/08/2022 0827   HDL 44.70 01/08/2022 0827   HDL 46 05/14/2019 1721   CHOLHDL 3 01/08/2022 0827   VLDL 39.2 01/08/2022 0827   LDLCALC 51 01/08/2022 0827   LDLCALC 63 02/17/2020 0733   LDLDIRECT 105.0 07/15/2017 0828     Other studies Reviewed: Additional studies/ records that were reviewed today with results demonstrating: labs reviewed.  No evidence of thoracic aortic aneurysm on November 2022 CT angio of head and neck.  Coronary artery calcifications noted.   ASSESSMENT AND PLAN:  CAD/old MI: Residual small vessel disease noted.  Managed medically.  Continue clopidogrel monotherapy.  No hematuria or blood in stool.  No angina.  Continue aggressive secondary prevention. Hyperlipidemia: LDL 51 total cholesterol 135 HDL 44 triglycerides 196 in September 2023.  Dietary recommendations given.  Continue Crestor and Zetia. PVCs: stable Obesity: We spoke at length about high-fiber diet and high-protein intake.  Try to minimize carbohydrate intake, specifically processed sugars. Diabetes. A1c 5.8 in September 2023   Current medicines are reviewed at length with the patient today.  The patient concerns regarding his medicines were addressed.  The following changes have been made:  No change  Labs/ tests ordered today include:  No orders of the defined  types were placed in this encounter.   Recommend 150 minutes/week of aerobic exercise Low fat, low carb, high fiber diet recommended  Disposition:   FU in 1 year   Signed, Larae Grooms, MD  07/25/2022 4:57 PM    [facility] [address], [place], [place]  [address] Phone: [phone]; Fax: [phone]

## 2022-07-25 ENCOUNTER — Ambulatory Visit: Payer: Medicare PPO | Attending: Interventional Cardiology | Admitting: Interventional Cardiology

## 2022-07-25 ENCOUNTER — Encounter: Payer: Self-pay | Admitting: Interventional Cardiology

## 2022-07-25 VITALS — BP 136/84 | HR 80 | Ht 71.0 in | Wt 233.4 lb

## 2022-07-25 DIAGNOSIS — I252 Old myocardial infarction: Secondary | ICD-10-CM

## 2022-07-25 DIAGNOSIS — I493 Ventricular premature depolarization: Secondary | ICD-10-CM | POA: Diagnosis not present

## 2022-07-25 DIAGNOSIS — I25118 Atherosclerotic heart disease of native coronary artery with other forms of angina pectoris: Secondary | ICD-10-CM

## 2022-07-25 DIAGNOSIS — E782 Mixed hyperlipidemia: Secondary | ICD-10-CM

## 2022-07-25 NOTE — Addendum Note (Signed)
Addended by: Jettie Booze on: 07/25/2022 05:27 PM   Modules accepted: Level of Service

## 2022-07-25 NOTE — Patient Instructions (Signed)
Medication Instructions:  Your physician recommends that you continue on your current medications as directed. Please refer to the Current Medication list given to you today.  *If you need a refill on your cardiac medications before your next appointment, please call your pharmacy*   Lab Work: none If you have labs (blood work) drawn today and your tests are completely normal, you will receive your results only by: [place] (if you have MyChart) OR A paper copy in the mail If you have any lab test that is abnormal or we need to change your treatment, we will call you to review the results.   Testing/Procedures: Your physician has requested that you have a carotid duplex. This test is an ultrasound of the carotid arteries in your neck. It looks at blood flow through these arteries that supply the brain with blood. Allow one hour for this exam. There are no restrictions or special instructions. To be done in December 2024   Follow-Up: At [facility], you and your health needs are our priority.  As part of our continuing mission to provide you with exceptional heart care, we have created designated Provider Care Teams.  These Care Teams include your primary Cardiologist (physician) and Advanced Practice Providers (APPs -  Physician Assistants and Nurse Practitioners) who all work together to provide you with the care you need, when you need it.  We recommend signing up for the patient portal called "MyChart".  Sign up information is provided on this After Visit Summary.  MyChart is used to connect with patients for Virtual Visits (Telemedicine).  Patients are able to view lab/test results, encounter notes, upcoming appointments, etc.  Non-urgent messages can be sent to your provider as well.   To learn more about what you can do with MyChart, go to [email or web address].    Your next appointment:   12 month(s)  Provider:   Larae Grooms, MD     Other Instructions

## 2022-09-27 ENCOUNTER — Ambulatory Visit (INDEPENDENT_AMBULATORY_CARE_PROVIDER_SITE_OTHER): Payer: Medicare PPO | Admitting: *Deleted

## 2022-09-27 DIAGNOSIS — Z0000 Encounter for general adult medical examination without abnormal findings: Secondary | ICD-10-CM | POA: Diagnosis not present

## 2022-09-27 NOTE — Progress Notes (Signed)
Subjective:   Shane Wood is a 67 y.o. male who presents for Medicare Annual/Subsequent preventive examination.  I connected with  Liberty Handy Brendle on 09/27/22 by a audio enabled telemedicine application and verified that I am speaking with the correct person using two identifiers.  Patient Location: Home  Provider Location: Office/Clinic  I discussed the limitations of evaluation and management by telemedicine. The patient expressed understanding and agreed to proceed.   Review of Systems     Cardiac Risk Factors include: advanced age (>76men, >2 women);male gender;dyslipidemia     Objective:    Today's Vitals   There is no height or weight on file to calculate BMI.     09/27/2022    8:27 AM 09/25/2021    9:19 AM 03/24/2021    8:17 AM 03/10/2021   12:41 PM 08/23/2017    6:30 AM 08/22/2017   10:38 PM 12/05/2016    8:17 AM  Advanced Directives  Does Patient Have a Medical Advance Directive? Yes Yes No No  No No  Type of Estate agent of [place];Living will Healthcare Power of Attorney       Copy of Healthcare Power of Attorney in Chart? No - copy requested No - copy requested       Would patient like information on creating a medical advance directive?    No - Patient declined No - Patient declined      Current Medications (verified) Outpatient Encounter Medications as of 09/27/2022  Medication Sig   Ascorbic Acid (VITAMIN C) 1000 MG tablet Take 1,000 mg by mouth every evening.   B Complex Vitamins (VITAMIN B-COMPLEX) TABS Take 1 tablet by mouth daily.   Cholecalciferol (VITAMIN D) 125 MCG (5000 UT) CAPS Take 1 capsule by mouth in the morning.   clopidogrel (PLAVIX) 75 MG tablet TAKE 1 TABLET BY MOUTH EVERY DAY   Cyanocobalamin (VITAMIN B 12 PO) Take 1 tablet by mouth every evening.   ezetimibe (ZETIA) 10 MG tablet TAKE 1 TABLET BY MOUTH EVERY DAY   metoprolol tartrate (LOPRESSOR) 25 MG tablet TAKE HALF A TABLET BY MOUTH 2 TIMES DAILY.   Multiple  Vitamin (MULTIVITAMIN) tablet Take 1 tablet by mouth every evening.   nitroGLYCERIN (NITROSTAT) 0.4 MG SL tablet PLACE 1 TABLET UNDER THE TONGUE EVERY 5 (FIVE) MINUTES X 3 DOSES AS NEEDED FOR CHEST PAIN.   pantoprazole (PROTONIX) 40 MG tablet TAKE 1 TABLET BY MOUTH EVERY DAY   Probiotic Product (PROBIOTIC DAILY PO) Take by mouth.   rosuvastatin (CRESTOR) 40 MG tablet Take 1 tablet (40 mg total) by mouth at bedtime.   No facility-administered encounter medications on file as of 09/27/2022.    Allergies (verified) Patient has no known allergies.   History: Past Medical History:  Diagnosis Date   Allergy    BACK PAIN    ELEVATED BP READING WITHOUT DX HYPERTENSION 05/03/2008   Qualifier: Diagnosis of  By: Drue Novel MD, Nolon Rod.    GERD (gastroesophageal reflux disease)    H/O cardiovascular stress test 2005    (-)   Hyperlipidemia    INSOMNIA-SLEEP DISORDER-UNSPEC    PVC (premature ventricular contraction)    Past Surgical History:  Procedure Laterality Date   ARTERY REPAIR Right 08/23/2017   Procedure: EXPLORATION RIGHT RADIAL ARTERY,  RIGHT FOREARM FASCIOTOMY;  Surgeon: Sherren Kerns, MD;  Location: [facility] OR;  Service: Vascular;  Laterality: Right;   BUBBLE STUDY  03/24/2021   Procedure: BUBBLE STUDY;  Surgeon: Quintella Reichert, MD;  Location: [facility] ENDOSCOPY;  Service: Cardiovascular;;   CHOLECYSTECTOMY     CORONARY STENT INTERVENTION N/A 08/23/2017   Procedure: CORONARY STENT INTERVENTION;  Surgeon: Corky Crafts, MD;  Location: [facility] INVASIVE CV LAB;  Service: Cardiovascular;  Laterality: N/A;   LEFT HEART CATH AND CORONARY ANGIOGRAPHY N/A 08/23/2017   Procedure: LEFT HEART CATH AND CORONARY ANGIOGRAPHY;  Surgeon: Corky Crafts, MD;  Location: [facility] INVASIVE CV LAB;  Service: Cardiovascular;  Laterality: N/A;   POLYPECTOMY     TEE WITHOUT CARDIOVERSION N/A 03/24/2021   Procedure: TRANSESOPHAGEAL ECHOCARDIOGRAM (TEE);  Surgeon: Quintella Reichert, MD;  Location: [facility] ENDOSCOPY;  Service:  Cardiovascular;  Laterality: N/A;   TONSILLECTOMY     age 38 y/o   Family History  Problem Relation Age of Onset   Lung cancer Father        smoker   Diabetes Maternal Grandmother    Diabetes Maternal Grandfather    Hypertension Mother    Depression Mother    Obesity Mother    Heart attack Other        uncle MI at age 85s   Stroke Other        GM, in her 23   Colon cancer Neg Hx    Prostate cancer Neg Hx    Colon polyps Neg Hx    Esophageal cancer Neg Hx    Rectal cancer Neg Hx    Stomach cancer Neg Hx    Pancreatic cancer Neg Hx    Social History   Socioeconomic History   Marital status: Married    Spouse name: Erskine Squibb   Number of children: 2   Years of education: Not on file   Highest education level: Not on file  Occupational History   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  Tobacco Use   Smoking status: Former   Smokeless tobacco: Former    Types: Chew    Quit date: 1970   Tobacco comments:    1 ppd , quit 1989  Vaping Use   Vaping Use: Never used  Substance and Sexual Activity   Alcohol use: Yes    Alcohol/week: 4.0 standard drinks of alcohol    Types: 4 Glasses of wine per week    Comment: occasionally on weekends   Drug use: No   Sexual activity: Not on file  Other Topics Concern   Not on file  Social History Narrative   Household pt and wife    2 adult married children   Social Determinants of Health   Financial Resource Strain: Low Risk  (09/25/2021)   Overall Financial Resource Strain (CARDIA)    Difficulty of Paying Living Expenses: Not hard at all  Food Insecurity: No Food Insecurity (09/27/2022)   Hunger Vital Sign    Worried About Running Out of Food in the Last Year: Never true    Ran Out of Food in the Last Year: Never true  Transportation Needs: No Transportation Needs (09/27/2022)   PRAPARE - Administrator, Civil Service (Medical): No    Lack of Transportation (Non-Medical): No  Physical Activity:  Sufficiently Active (09/25/2021)   Exercise Vital Sign    Days of Exercise per Week: 7 days    Minutes of Exercise per Session: 30 min  Stress: No Stress Concern Present (09/25/2021)   [employer] of Occupational Health - Occupational Stress Questionnaire    Feeling of Stress : Only a little  Social Connections: Socially Integrated (09/25/2021)  Social Advertising account executive [NHANES]    Frequency of Communication with Friends and Family: More than three times a week    Frequency of Social Gatherings with Friends and Family: More than three times a week    Attends Religious Services: More than 4 times per year    Active Member of [employer] or Organizations: Yes    Attends Banker Meetings: 1 to 4 times per year    Marital Status: Married    Tobacco Counseling Counseling given: Not Answered Tobacco comments: 1 ppd , quit 1989   Clinical Intake:  Pre-visit preparation completed: Yes  Pain : No/denies pain  Nutritional Risks: None Diabetes: No  How often do you need to have someone help you when you read instructions, pamphlets, or other written materials from your doctor or pharmacy?: 1 - Never  Activities of Daily Living    09/27/2022    8:31 AM  In your present state of health, do you have any difficulty performing the following activities:  Hearing? 0  Vision? 0  Difficulty concentrating or making decisions? 0  Walking or climbing stairs? 0  Dressing or bathing? 0  Doing errands, shopping? 0  Preparing Food and eating ? N  Using the Toilet? N  In the past six months, have you accidently leaked urine? N  Do you have problems with loss of bowel control? N  Managing your Medications? N  Managing your Finances? N  Housekeeping or managing your Housekeeping? N    Patient Care Team: Wanda Plump, MD as PCP - General Corky Crafts, MD as PCP - Cardiology (Cardiology) Wendall Stade, MD as Consulting Physician (Cardiology) Mat Carne, DO as  Consulting Physician (Optometry)  Indicate any recent Medical Services you may have received from other than [facility] providers in the past year (date may be approximate).     Assessment:   This is a routine wellness examination for [place].  Hearing/Vision screen No results found.  Dietary issues and exercise activities discussed: Current Exercise Habits: Home exercise routine, Type of exercise: Other - see comments;walking (Peloton bike), Time (Minutes): 40, Frequency (Times/Week): 3, Weekly Exercise (Minutes/Week): 120, Intensity: Mild, Exercise limited by: None identified   Goals Addressed   None    Depression Screen    09/27/2022    8:30 AM 01/08/2022    8:37 AM 09/25/2021    9:21 AM 04/11/2021   11:09 AM 12/13/2020    4:06 PM 01/01/2020    1:49 PM 12/22/2018    1:44 PM  PHQ 2/9 Scores  PHQ - 2 Score 0 0 0 0 0 0 0    Fall Risk    09/27/2022    8:28 AM 01/08/2022    8:37 AM 09/25/2021    9:20 AM 04/11/2021   11:09 AM 01/05/2021    8:06 AM  Fall Risk   Falls in the past year? 0 0 0 0 0  Number falls in past yr: 0 0 0 0 0  Injury with Fall? 0 0 0 0 0  Risk for fall due to : No Fall Risks      Follow up Falls evaluation completed Falls evaluation completed Falls prevention discussed Falls evaluation completed Falls evaluation completed    FALL RISK PREVENTION PERTAINING TO THE HOME:  Any stairs in or around the home? Yes  If so, are there any without handrails? No  Home free of loose throw rugs in walkways, pet beds, electrical cords, etc? Yes  Adequate  lighting in your home to reduce risk of falls? Yes   ASSISTIVE DEVICES UTILIZED TO PREVENT FALLS:  Life alert? No  Use of a cane, walker or w/c? No  Grab bars in the bathroom? No  Shower chair or bench in shower? No  Elevated toilet seat or a handicapped toilet? No   TIMED UP AND GO:  Was the test performed?  No, audio visit .   Cognitive Function:        09/27/2022    8:35 AM  6CIT Screen  What Year? 0 points   What month? 0 points  What time? 0 points  Count back from 20 0 points  Months in reverse 0 points  Repeat phrase 0 points  Total Score 0 points    Immunizations Immunization History  Administered Date(s) Administered   Fluad Quad(high Dose 65+) 01/05/2021, 12/31/2021   Influenza Split 12/28/2017   Influenza Whole 01/21/2010   Influenza,inj,Quad PF,6+ Mos 12/22/2018   Influenza-Unspecified 01/30/2015, 04/07/2016, 12/29/2016, 02/07/2020   Moderna Covid-19 Vaccine Bivalent Booster 21yrs & up 01/22/2022   Moderna Sars-Covid-2 Vaccination 10/16/2020   PFIZER(Purple Top)SARS-COV-2 Vaccination 07/03/2019, 07/29/2019, 02/21/2020   PNEUMOCOCCAL CONJUGATE-20 01/05/2021   Pfizer Covid-19 Vaccine Bivalent Booster 49yrs & up 02/21/2021   Pneumococcal Polysaccharide-23 12/22/2018   Respiratory Syncytial Virus Vaccine,Recomb Aduvanted(Arexvy) 01/22/2022   Td 07/06/2005   Tdap 07/08/2015   Zoster Recombinat (Shingrix) 01/01/2020, 03/04/2020   Zoster, Live 07/08/2015    TDAP status: Up to date  Flu Vaccine status: Up to date  Pneumococcal vaccine status: Up to date  Covid-19 vaccine status: Information provided on how to obtain vaccines.   Qualifies for Shingles Vaccine? Yes   Zostavax completed Yes   Shingrix Completed?: Yes  Screening Tests Health Maintenance  Topic Date Due   COVID-19 Vaccine (7 - 2023-24 season) 03/19/2022   INFLUENZA VACCINE  11/22/2022   Medicare Annual Wellness (AWV)  09/27/2023   DTaP/Tdap/Td (3 - Td or Tdap) 07/07/2025   Colonoscopy  12/20/2026   Pneumonia Vaccine 60+ Years old  Completed   Hepatitis C Screening  Completed   Zoster Vaccines- Shingrix  Completed   HPV VACCINES  Aged Out    Health Maintenance  Health Maintenance Due  Topic Date Due   COVID-19 Vaccine (7 - 2023-24 season) 03/19/2022    Colorectal cancer screening: Type of screening: Colonoscopy. Completed 12/19/16. Repeat every 10 years  Lung Cancer Screening: (Low Dose CT Chest  recommended if Age 62-80 years, 30 pack-year currently smoking OR have quit w/in 15years.) does not qualify.    Additional Screening:  Hepatitis C Screening: does qualify; Completed 07/10/16  Vision Screening: Recommended annual ophthalmology exams for early detection of glaucoma and other disorders of the eye. Is the patient up to date with their annual eye exam?  Yes  Who is the provider or what is the name of the office in which the patient attends annual eye exams? [facility]. If pt is not established with a provider, would they like to be referred to a provider to establish care? No .   Dental Screening: Recommended annual dental exams for proper oral hygiene  Community Resource Referral / Chronic Care Management: CRR required this visit?  No   CCM required this visit?  No      Plan:     I have personally reviewed and noted the following in the patient's chart:   Medical and social history Use of alcohol, tobacco or illicit drugs  Current medications and supplements  including opioid prescriptions. Patient is not currently taking opioid prescriptions. Functional ability and status Nutritional status Physical activity Advanced directives List of other physicians Hospitalizations, surgeries, and ER visits in previous 12 months Vitals Screenings to include cognitive, depression, and falls Referrals and appointments  In addition, I have reviewed and discussed with patient certain preventive protocols, quality metrics, and best practice recommendations. A written personalized care plan for preventive services as well as general preventive health recommendations were provided to patient.   Due to this being a telephonic visit, the after visit summary with patients personalized plan was offered to patient via mail or my-chart. Patient would like to access on my-chart.  Donne Anon, [place]   09/27/2022   Nurse Notes: None

## 2022-09-27 NOTE — Patient Instructions (Signed)
[name] , Thank you for taking time to come for your Medicare Wellness Visit. I appreciate your ongoing commitment to your health goals. Please review the following plan we discussed and let me know if I can assist you in the future.     This is a list of the screening recommended for you and due dates:  Health Maintenance  Topic Date Due   COVID-19 Vaccine (7 - 2023-24 season) 03/19/2022   Flu Shot  11/22/2022   Medicare Annual Wellness Visit  09/27/2023   DTaP/Tdap/Td vaccine (3 - Td or Tdap) 07/07/2025   Colon Cancer Screening  12/20/2026   Pneumonia Vaccine  Completed   Hepatitis C Screening  Completed   Zoster (Shingles) Vaccine  Completed   HPV Vaccine  Aged Out    Next appointment: Follow up in one year for your annual wellness visit.   Preventive Care 10 Years and Older, Male Preventive care refers to lifestyle choices and visits with your health care provider that can promote health and wellness. What does preventive care include? A yearly physical exam. This is also called an annual well check. Dental exams once or twice a year. Routine eye exams. Ask your health care provider how often you should have your eyes checked. Personal lifestyle choices, including: Daily care of your teeth and gums. Regular physical activity. Eating a healthy diet. Avoiding tobacco and drug use. Limiting alcohol use. Practicing safe sex. Taking low doses of aspirin every day. Taking vitamin and mineral supplements as recommended by your health care provider. What happens during an annual well check? The services and screenings done by your health care provider during your annual well check will depend on your age, overall health, lifestyle risk factors, and family history of disease. Counseling  Your health care provider may ask you questions about your: Alcohol use. Tobacco use. Drug use. Emotional well-being. Home and relationship well-being. Sexual activity. Eating  habits. History of falls. Memory and ability to understand (cognition). Work and work Astronomer. Screening  You may have the following tests or measurements: Height, weight, and BMI. Blood pressure. Lipid and cholesterol levels. These may be checked every 5 years, or more frequently if you are over 68 years old. Skin check. Lung cancer screening. You may have this screening every year starting at age 38 if you have a 30-pack-year history of smoking and currently smoke or have quit within the past 15 years. Fecal occult blood test (FOBT) of the stool. You may have this test every year starting at age 5. Flexible sigmoidoscopy or colonoscopy. You may have a sigmoidoscopy every 5 years or a colonoscopy every 10 years starting at age 67. Prostate cancer screening. Recommendations will vary depending on your family history and other risks. Hepatitis C blood test. Hepatitis B blood test. Sexually transmitted disease (STD) testing. Diabetes screening. This is done by checking your blood sugar (glucose) after you have not eaten for a while (fasting). You may have this done every 1-3 years. Abdominal aortic aneurysm (AAA) screening. You may need this if you are a current or former smoker. Osteoporosis. You may be screened starting at age 65 if you are at high risk. Talk with your health care provider about your test results, treatment options, and if necessary, the need for more tests. Vaccines  Your health care provider may recommend certain vaccines, such as: Influenza vaccine. This is recommended every year. Tetanus, diphtheria, and acellular pertussis (Tdap, Td) vaccine. You may need a Td booster every 10 years.  Zoster vaccine. You may need this after age 93. Pneumococcal 13-valent conjugate (PCV13) vaccine. One dose is recommended after age 3. Pneumococcal polysaccharide (PPSV23) vaccine. One dose is recommended after age 60. Talk to your health care provider about which screenings and  vaccines you need and how often you need them. This information is not intended to replace advice given to you by your health care provider. Make sure you discuss any questions you have with your health care provider. Document Released: 05/06/2015 Document Revised: 12/28/2015 Document Reviewed: 02/08/2015 Elsevier Interactive Patient Education  2017 [employer].  Fall Prevention in the Home Falls can cause injuries. They can happen to people of all ages. There are many things you can do to make your home safe and to help prevent falls. What can I do on the outside of my home? Regularly fix the edges of walkways and driveways and fix any cracks. Remove anything that might make you trip as you walk through a door, such as a raised step or threshold. Trim any bushes or trees on the path to your home. Use bright outdoor lighting. Clear any walking paths of anything that might make someone trip, such as rocks or tools. Regularly check to see if handrails are loose or broken. Make sure that both sides of any steps have handrails. Any raised decks and porches should have guardrails on the edges. Have any leaves, snow, or ice cleared regularly. Use sand or salt on walking paths during winter. Clean up any spills in your garage right away. This includes oil or grease spills. What can I do in the bathroom? Use night lights. Install grab bars by the toilet and in the tub and shower. Do not use towel bars as grab bars. Use non-skid mats or decals in the tub or shower. If you need to sit down in the shower, use a plastic, non-slip stool. Keep the floor dry. Clean up any water that spills on the floor as soon as it happens. Remove soap buildup in the tub or shower regularly. Attach bath mats securely with double-sided non-slip rug tape. Do not have throw rugs and other things on the floor that can make you trip. What can I do in the bedroom? Use night lights. Make sure that you have a light by your  bed that is easy to reach. Do not use any sheets or blankets that are too big for your bed. They should not hang down onto the floor. Have a firm chair that has side arms. You can use this for support while you get dressed. Do not have throw rugs and other things on the floor that can make you trip. What can I do in the kitchen? Clean up any spills right away. Avoid walking on wet floors. Keep items that you use a lot in easy-to-reach places. If you need to reach something above you, use a strong step stool that has a grab bar. Keep electrical cords out of the way. Do not use floor polish or wax that makes floors slippery. If you must use wax, use non-skid floor wax. Do not have throw rugs and other things on the floor that can make you trip. What can I do with my stairs? Do not leave any items on the stairs. Make sure that there are handrails on both sides of the stairs and use them. Fix handrails that are broken or loose. Make sure that handrails are as long as the stairways. Check any carpeting to make sure that  it is firmly attached to the stairs. Fix any carpet that is loose or worn. Avoid having throw rugs at the top or bottom of the stairs. If you do have throw rugs, attach them to the floor with carpet tape. Make sure that you have a light switch at the top of the stairs and the bottom of the stairs. If you do not have them, ask someone to add them for you. What else can I do to help prevent falls? Wear shoes that: Do not have high heels. Have rubber bottoms. Are comfortable and fit you well. Are closed at the toe. Do not wear sandals. If you use a stepladder: Make sure that it is fully opened. Do not climb a closed stepladder. Make sure that both sides of the stepladder are locked into place. Ask someone to hold it for you, if possible. Clearly mark and make sure that you can see: Any grab bars or handrails. First and last steps. Where the edge of each step is. Use tools that  help you move around (mobility aids) if they are needed. These include: Canes. Walkers. Scooters. Crutches. Turn on the lights when you go into a dark area. Replace any light bulbs as soon as they burn out. Set up your furniture so you have a clear path. Avoid moving your furniture around. If any of your floors are uneven, fix them. If there are any pets around you, be aware of where they are. Review your medicines with your doctor. Some medicines can make you feel dizzy. This can increase your chance of falling. Ask your doctor what other things that you can do to help prevent falls. This information is not intended to replace advice given to you by your health care provider. Make sure you discuss any questions you have with your health care provider. Document Released: 02/03/2009 Document Revised: 09/15/2015 Document Reviewed: 05/14/2014 Elsevier Interactive Patient Education  2017 [employer].

## 2022-10-04 ENCOUNTER — Other Ambulatory Visit: Payer: Self-pay | Admitting: Interventional Cardiology

## 2022-12-06 ENCOUNTER — Encounter (INDEPENDENT_AMBULATORY_CARE_PROVIDER_SITE_OTHER): Payer: Self-pay

## 2022-12-10 ENCOUNTER — Other Ambulatory Visit: Payer: Self-pay | Admitting: Oncology

## 2022-12-10 DIAGNOSIS — Z006 Encounter for examination for normal comparison and control in clinical research program: Secondary | ICD-10-CM

## 2023-01-14 ENCOUNTER — Ambulatory Visit (INDEPENDENT_AMBULATORY_CARE_PROVIDER_SITE_OTHER): Payer: Medicare PPO | Admitting: Internal Medicine

## 2023-01-14 ENCOUNTER — Encounter: Payer: Self-pay | Admitting: Internal Medicine

## 2023-01-14 VITALS — BP 126/64 | HR 58 | Temp 97.8°F | Resp 16 | Ht 71.0 in | Wt 239.1 lb

## 2023-01-14 DIAGNOSIS — E559 Vitamin D deficiency, unspecified: Secondary | ICD-10-CM

## 2023-01-14 DIAGNOSIS — R739 Hyperglycemia, unspecified: Secondary | ICD-10-CM | POA: Diagnosis not present

## 2023-01-14 DIAGNOSIS — Z0000 Encounter for general adult medical examination without abnormal findings: Secondary | ICD-10-CM | POA: Diagnosis not present

## 2023-01-14 DIAGNOSIS — E782 Mixed hyperlipidemia: Secondary | ICD-10-CM

## 2023-01-14 LAB — CBC WITH DIFFERENTIAL/PLATELET
Basophils Absolute: 0 10*3/uL (ref 0.0–0.1)
Basophils Relative: 0.4 % (ref 0.0–3.0)
Eosinophils Absolute: 0.1 10*3/uL (ref 0.0–0.7)
Eosinophils Relative: 2.4 % (ref 0.0–5.0)
HCT: 40.9 % (ref 39.0–52.0)
Hemoglobin: 13.3 g/dL (ref 13.0–17.0)
Lymphocytes Relative: 28.6 % (ref 12.0–46.0)
Lymphs Abs: 1.6 10*3/uL (ref 0.7–4.0)
MCHC: 32.4 g/dL (ref 30.0–36.0)
MCV: 87.5 fl (ref 78.0–100.0)
Monocytes Absolute: 0.5 10*3/uL (ref 0.1–1.0)
Monocytes Relative: 8.9 % (ref 3.0–12.0)
Neutro Abs: 3.4 10*3/uL (ref 1.4–7.7)
Neutrophils Relative %: 59.7 % (ref 43.0–77.0)
Platelets: 223 10*3/uL (ref 150.0–400.0)
RBC: 4.67 Mil/uL (ref 4.22–5.81)
RDW: 14.4 % (ref 11.5–15.5)
WBC: 5.7 10*3/uL (ref 4.0–10.5)

## 2023-01-14 LAB — LIPID PANEL
Cholesterol: 127 mg/dL (ref 0–200)
HDL: 49.7 mg/dL (ref >39.00)
LDL Cholesterol: 54 mg/dL (ref 0–99)
NonHDL: 77.19
Total CHOL/HDL Ratio: 3
Triglycerides: 118 mg/dL (ref 0.0–149.0)
VLDL: 23.6 mg/dL (ref 0.0–40.0)

## 2023-01-14 LAB — COMPREHENSIVE METABOLIC PANEL
ALT: 31 U/L (ref 0–53)
AST: 21 U/L (ref 0–37)
Albumin: 4.2 g/dL (ref 3.5–5.2)
Alkaline Phosphatase: 41 U/L (ref 39–117)
BUN: 20 mg/dL (ref 6–23)
CO2: 26 mEq/L (ref 19–32)
Calcium: 9.3 mg/dL (ref 8.4–10.5)
Chloride: 106 mEq/L (ref 96–112)
Creatinine, Ser: 1.08 mg/dL (ref 0.40–1.50)
GFR: 70.99 mL/min (ref >60.00)
Glucose, Bld: 98 mg/dL (ref 70–99)
Potassium: 4.6 mEq/L (ref 3.5–5.1)
Sodium: 141 mEq/L (ref 135–145)
Total Bilirubin: 0.5 mg/dL (ref 0.2–1.2)
Total Protein: 6.5 g/dL (ref 6.0–8.3)

## 2023-01-14 LAB — HEMOGLOBIN A1C: Hgb A1c MFr Bld: 5.8 % (ref 4.6–6.5)

## 2023-01-14 LAB — VITAMIN D 25 HYDROXY (VIT D DEFICIENCY, FRACTURES): VITD: 32.45 ng/mL (ref 30.00–100.00)

## 2023-01-14 LAB — PSA: PSA: 0.71 ng/mL (ref 0.10–4.00)

## 2023-01-14 MED ORDER — NITROGLYCERIN 0.4 MG SL SUBL: 0.4000 mg | SUBLINGUAL_TABLET | SUBLINGUAL | 3 refills | Status: AC | PRN

## 2023-01-14 NOTE — Progress Notes (Signed)
Subjective:    Patient ID: Shane Wood, male    DOB: [birth year], 67 y.o.   MRN: [id number]  DOS:  01/14/2023 Type of visit - description: cpx  Here for CPX. Doing well.   Review of Systems  A 14 point review of systems is negative    Past Medical History:  Diagnosis Date   Allergy    BACK PAIN    ELEVATED BP READING WITHOUT DX HYPERTENSION 05/03/2008   Qualifier: Diagnosis of  By: Drue Novel MD, Nolon Rod.    GERD (gastroesophageal reflux disease)    H/O cardiovascular stress test 2005    (-)   Hyperlipidemia    INSOMNIA-SLEEP DISORDER-UNSPEC    PVC (premature ventricular contraction)     Past Surgical History:  Procedure Laterality Date   ARTERY REPAIR Right 08/23/2017   Procedure: EXPLORATION RIGHT RADIAL ARTERY,  RIGHT FOREARM FASCIOTOMY;  Surgeon: Sherren Kerns, MD;  Location: [facility] OR;  Service: Vascular;  Laterality: Right;   BUBBLE STUDY  03/24/2021   Procedure: BUBBLE STUDY;  Surgeon: Quintella Reichert, MD;  Location: [facility] ENDOSCOPY;  Service: Cardiovascular;;   CHOLECYSTECTOMY     CORONARY STENT INTERVENTION N/A 08/23/2017   Procedure: CORONARY STENT INTERVENTION;  Surgeon: Corky Crafts, MD;  Location: [facility] INVASIVE CV LAB;  Service: Cardiovascular;  Laterality: N/A;   LEFT HEART CATH AND CORONARY ANGIOGRAPHY N/A 08/23/2017   Procedure: LEFT HEART CATH AND CORONARY ANGIOGRAPHY;  Surgeon: Corky Crafts, MD;  Location: [facility] INVASIVE CV LAB;  Service: Cardiovascular;  Laterality: N/A;   POLYPECTOMY     TEE WITHOUT CARDIOVERSION N/A 03/24/2021   Procedure: TRANSESOPHAGEAL ECHOCARDIOGRAM (TEE);  Surgeon: Quintella Reichert, MD;  Location: [facility] ENDOSCOPY;  Service: Cardiovascular;  Laterality: N/A;   TONSILLECTOMY     age 62 y/o   Social History   Socioeconomic History   Marital status: Married    Spouse name: Erskine Squibb   Number of children: 2   Years of education: Not on file   Highest education level: Not on file  Occupational History   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  Tobacco Use   Smoking status: Former   Smokeless tobacco: Former    Types: Chew    Quit date: 1970   Tobacco comments:    1 ppd , quit 1989  Vaping Use   Vaping status: Never Used  Substance and Sexual Activity   Alcohol use: Yes    Alcohol/week: 4.0 standard drinks of alcohol    Types: 4 Glasses of wine per week    Comment: occasionally on weekends   Drug use: No   Sexual activity: Not on file  Other Topics Concern   Not on file  Social History Narrative   Household pt and wife    2 adult married children   Social Determinants of Health   Financial Resource Strain: Low Risk  (09/25/2021)   Overall Financial Resource Strain (CARDIA)    Difficulty of Paying Living Expenses: Not hard at all  Food Insecurity: No Food Insecurity (09/27/2022)   Hunger Vital Sign    Worried About Running Out of Food in the Last Year: Never true    Ran Out of Food in the Last Year: Never true  Transportation Needs: No Transportation Needs (09/27/2022)   PRAPARE - Administrator, Civil Service (Medical): No    Lack of Transportation (Non-Medical): No  Physical Activity: Sufficiently Active (09/25/2021)   Exercise Vital Sign  Days of Exercise per Week: 7 days    Minutes of Exercise per Session: 30 min  Stress: No Stress Concern Present (09/25/2021)   [employer] of Occupational Health - Occupational Stress Questionnaire    Feeling of Stress : Only a little  Social Connections: Socially Integrated (09/25/2021)   Social Connection and Isolation Panel [NHANES]    Frequency of Communication with Friends and Family: More than three times a week    Frequency of Social Gatherings with Friends and Family: More than three times a week    Attends Religious Services: More than 4 times per year    Active Member of [employer] or Organizations: Yes    Attends Banker Meetings: 1 to 4 times per year    Marital Status: Married  Catering manager Violence: Not  At Risk (09/27/2022)   Humiliation, Afraid, Rape, and Kick questionnaire    Fear of Current or Ex-Partner: No    Emotionally Abused: No    Physically Abused: No    Sexually Abused: No     Current Outpatient Medications  Medication Instructions   B Complex Vitamins (VITAMIN B-COMPLEX) TABS 1 tablet, Oral, Daily   Cholecalciferol (VITAMIN D) 125 MCG (5000 UT) CAPS 1 capsule, Oral, Every morning   clopidogrel (PLAVIX) 75 MG tablet TAKE 1 TABLET BY MOUTH EVERY DAY   Cyanocobalamin (VITAMIN B 12 PO) 1 tablet, Oral, Every evening   ezetimibe (ZETIA) 10 MG tablet TAKE 1 TABLET BY MOUTH EVERY DAY   metoprolol tartrate (LOPRESSOR) 25 MG tablet TAKE HALF A TABLET BY MOUTH 2 TIMES DAILY.   Multiple Vitamin (MULTIVITAMIN) tablet 1 tablet, Oral, Every evening,     nitroGLYCERIN (NITROSTAT) 0.4 mg, Sublingual, Every 5 min x3 PRN   pantoprazole (PROTONIX) 40 MG tablet TAKE 1 TABLET BY MOUTH EVERY DAY   Probiotic Product (PROBIOTIC DAILY PO) Oral   rosuvastatin (CRESTOR) 40 mg, Oral, Daily at bedtime   vitamin C 1,000 mg, Oral, Every evening       Objective:   Physical Exam BP 126/64   Pulse (!) 58   Temp 97.8 F (36.6 C) (Oral)   Resp 16   Ht 5\' 11"  (1.803 m)   Wt 239 lb 2 oz (108.5 kg)   SpO2 97%   BMI 33.35 kg/m  General: Well developed, NAD, BMI noted Neck: No  thyromegaly  HEENT:  Normocephalic . Face symmetric, atraumatic Lungs:  CTA B Normal respiratory effort, no intercostal retractions, no accessory muscle use. Heart: RRR,  no murmur.  Abdomen:  Not distended, soft, non-tender. No rebound or rigidity.   Lower extremities: no pretibial edema bilaterally  Skin: Exposed areas without rash. Not pale. Not jaundice Neurologic:  alert & oriented X3.  Speech normal, gait appropriate for age and unassisted Strength symmetric and appropriate for age.  Psych: Cognition and judgment appear intact.  Cooperative with normal attention span and concentration.  Behavior  appropriate. No anxious or depressed appearing.     Assessment    Assessment  Hyperlipidemia CV: --CAD: Non-STEMI 08/23/2017, 2 stents --L Branch retinal artery occlusion (02-2022) -- PVCs -palpitations-- BB prn, sx usually related to anxiety Elevated BP Anxiety, insomnia   PLAN Here for CPX - Td 2017 - s/p zostavax ; s/p  shingrix:   - PNM 23: 11/2018;  PNM 20: 2022 - s/p RSV   - just had the flu and covid booster --CCS:  Colonoscopy 09/2010, cscope again 11-2016, next 10 years per GI letter Dr. Christella Hartigan.   --Prostate ca  screening:   no symptoms, check PSA --Exercise,diet: Recommend portion control, calorie counting?  Encouraged 3 hours of exercise a week. - labs CMP FLP A1c PSA - POA: See AVS Hyperlipidemia:Continue Zetia and rosuvastatin.  Checking labs CAD: Saw cardiology, no changes made.  No symptoms. Elevated BP: No recent ambulatory BPs, encouraged to do. Obesity: We talk about diet including calorie counting, also exercise 3 hours a week. Vitamin D deficiency: On OTCs, checking levels. RTC 1 year

## 2023-01-14 NOTE — Assessment & Plan Note (Signed)
Here for CPX Hyperlipidemia:Continue Zetia and rosuvastatin.  Checking labs CAD: Saw cardiology, no changes made.  No symptoms. Elevated BP: No recent ambulatory BPs, encouraged to do. Obesity: We talk about diet including calorie counting, also exercise 3 hours a week. Vitamin D deficiency: On OTCs, checking levels. RTC 1 year

## 2023-01-14 NOTE — Patient Instructions (Addendum)
Please bring [name] a copy of your Healthcare Power of Attorney for your chart.   Check the  blood pressure regularly Blood pressure goal:  between 110/65 and  135/85. If it is consistently higher or lower, let me know     GO TO THE LAB : Get the blood work     Next visit with me for a physical exam in 1 year Please schedule it at the front desk     If you have MyChart, please check frequently in the next few days for your results

## 2023-01-14 NOTE — Assessment & Plan Note (Signed)
Here for CPX - Td 2017 - s/p zostavax ; s/p  shingrix:   - PNM 23: 11/2018;  PNM 20: 2022 - s/p RSV   - just had the flu and covid booster --CCS:  Colonoscopy 09/2010, cscope again 11-2016, next 10 years per GI letter Dr. Christella Hartigan.   --Prostate ca screening:   no symptoms, check PSA --Exercise,diet: Recommend portion control, calorie counting?  Encouraged 3 hours of exercise a week. - labs CMP FLP A1c PSA - POA: See AVS

## 2023-01-18 ENCOUNTER — Encounter: Payer: Self-pay | Admitting: Interventional Cardiology

## 2023-02-08 ENCOUNTER — Ambulatory Visit: Payer: Medicare PPO | Admitting: Cardiology

## 2023-03-04 ENCOUNTER — Other Ambulatory Visit (HOSPITAL_COMMUNITY): Payer: Self-pay | Admitting: Cardiology

## 2023-03-04 DIAGNOSIS — I779 Disorder of arteries and arterioles, unspecified: Secondary | ICD-10-CM

## 2023-03-07 ENCOUNTER — Encounter: Payer: Self-pay | Admitting: Cardiology

## 2023-03-07 ENCOUNTER — Ambulatory Visit: Payer: Medicare PPO | Attending: Cardiology | Admitting: Cardiology

## 2023-03-07 VITALS — BP 132/94 | HR 76 | Resp 16 | Ht 71.0 in | Wt 242.4 lb

## 2023-03-07 DIAGNOSIS — I10 Essential (primary) hypertension: Secondary | ICD-10-CM | POA: Diagnosis not present

## 2023-03-07 DIAGNOSIS — I2510 Atherosclerotic heart disease of native coronary artery without angina pectoris: Secondary | ICD-10-CM | POA: Diagnosis not present

## 2023-03-07 NOTE — Progress Notes (Signed)
Cardiology Office Note:  .   Date:  03/07/2023  ID:  Shane Wood, DOB Apr 05, 1956, MRN [id number] PCP: Wanda Plump, MD  [facility] Providers Cardiologist:  Truett Mainland, MD PCP: Wanda Plump, MD  Chief Complaint  Patient presents with   Coronary artery disease involving native coronary artery of   Follow-up      History of Present Illness: .    Shane Wood is a 67 y.o. male with hypertension, hyperlipidemia, CAD  Patient was last seen by Dr. Eldridge Dace in 07/2022. Patient had MI in 2019, was treated with overlapping stents to RCA as well as stent to RPDA.  He has residual moderate disease in LAD and left circumflex, with occluded marginal branches.  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  However, he exercises on Pel[employer] bike and walks regularly without any symptoms of chest pain or shortness of breath.  Reviewed recent lab results with the patient, details below.  Blood pressure is elevated today.  Vitals:   03/07/23 1442  BP: (!) 132/94  Pulse: 76  Resp: 16  SpO2: 98%     ROS:  Review of Systems  Cardiovascular:  Negative for chest pain, dyspnea on exertion, leg swelling, palpitations and syncope.     Studies Reviewed: [name]       EKG 03/07/2023: Normal sinus rhythm Possible Left atrial enlargement Nonspecific ST and T wave abnormality When compared with ECG of 11-Mar-2021 10:41, No significant change was found  Labs 12/2022: LDL 54   Independently interpreted Coronary angiogram 08/2017: Occluded proximal RCA, treated with overlapping stents, as well as stent to RPDA severe stenosis Moderate proximal LAD and left circumflex disease with occluded OM branches    Physical Exam:   Physical Exam Vitals and nursing note reviewed.  Constitutional:      General: He is not in acute distress. Neck:     Vascular: No JVD.  Cardiovascular:     Rate and Rhythm: Normal rate and regular rhythm.     Heart sounds: Normal heart sounds. No  murmur heard. Pulmonary:     Effort: Pulmonary effort is normal.     Breath sounds: Normal breath sounds. No wheezing or rales.  Musculoskeletal:     Right lower leg: No edema.     Left lower leg: No edema.      VISIT DIAGNOSES:   ICD-10-CM   1. Coronary artery disease involving native coronary artery of native heart without angina pectoris  I25.10 EKG 12-Lead    2. Primary hypertension  I10 AMB Referral to [facility] Pharm-D       ASSESSMENT AND PLAN: .    Shane Wood is a 67 y.o. male with hypertension, hyperlipidemia, CAD  Hypertension: At baseline, not known to be elevated.  2 separate blood pressure readings elevated today.  I have encouraged him to start checking blood pressure regularly at home.  Follow-up with Pharm.D. in 4 to 6 weeks to reassess blood pressure.  If truly elevated, recommend adding amlodipine 5 mg daily.  Continue metoprolol tartrate 25 mg daily, given moderate residual CAD and prior PVCs.  CAD: No angina symptoms. Tolerating Plavix monotherapy. Lipids very well-controlled.  Mixed hyperlipidemia: Lipids very well-controlled. Continue rosuvastatin 40 mg daily.  I spent 15 minutes the day of her visit reviewing her records including previous charts and labs, 10 minutes talking with her about her history and in face-to-face counseling for a total of 25 minutes spent in patient care.  F/u in 4-6 wks w/pharmD, 6 months w/me  Signed, Elder Negus, MD

## 2023-03-07 NOTE — Patient Instructions (Signed)
Medication Instructions:  Your physician recommends that you continue on your current medications as directed. Please refer to the Current Medication list given to you today.  *If you need a refill on your cardiac medications before your next appointment, please call your pharmacy*  Lab Work: None ordered today. If you have labs (blood work) drawn today and your tests are completely normal, you will receive your results only by: MyChart Message (if you have MyChart) OR A paper copy in the mail If you have any lab test that is abnormal or we need to change your treatment, we will call you to review the results.  Testing/Procedures: None ordered today.  Follow-Up: At [facility], you and your health needs are our priority.  As part of our continuing mission to provide you with exceptional heart care, we have created designated Provider Care Teams.  These Care Teams include your primary Cardiologist (physician) and Advanced Practice Providers (APPs -  Physician Assistants and Nurse Practitioners) who all work together to provide you with the care you need, when you need it.  You have been referred to PharmD to see a pharmacist to help with blood pressure management. Dr. Rosemary Holms requested for you to be seen in the next 6 weeks.   Your next appointment:   6 month(s)  The format for your next appointment:   In Person  Provider:   Elder Negus, MD {

## 2023-04-01 ENCOUNTER — Ambulatory Visit (HOSPITAL_COMMUNITY)
Admission: RE | Admit: 2023-04-01 | Discharge: 2023-04-01 | Disposition: A | Discharge status: Home / Self Care | Payer: Medicare PPO | Source: Ambulatory Visit | Attending: Cardiovascular Disease | Admitting: Cardiovascular Disease

## 2023-04-01 DIAGNOSIS — I779 Disorder of arteries and arterioles, unspecified: Secondary | ICD-10-CM | POA: Insufficient documentation

## 2023-04-07 ENCOUNTER — Other Ambulatory Visit: Payer: Self-pay | Admitting: Internal Medicine

## 2023-04-09 DIAGNOSIS — H34212 Partial retinal artery occlusion, left eye: Secondary | ICD-10-CM | POA: Diagnosis not present

## 2023-04-09 DIAGNOSIS — H35033 Hypertensive retinopathy, bilateral: Secondary | ICD-10-CM | POA: Diagnosis not present

## 2023-04-09 DIAGNOSIS — H43813 Vitreous degeneration, bilateral: Secondary | ICD-10-CM | POA: Diagnosis not present

## 2023-04-09 DIAGNOSIS — H524 Presbyopia: Secondary | ICD-10-CM | POA: Diagnosis not present

## 2023-04-09 DIAGNOSIS — H52222 Regular astigmatism, left eye: Secondary | ICD-10-CM | POA: Diagnosis not present

## 2023-04-09 DIAGNOSIS — H5213 Myopia, bilateral: Secondary | ICD-10-CM | POA: Diagnosis not present

## 2023-04-09 DIAGNOSIS — H2513 Age-related nuclear cataract, bilateral: Secondary | ICD-10-CM | POA: Diagnosis not present

## 2023-04-22 ENCOUNTER — Ambulatory Visit: Payer: Medicare PPO | Attending: Cardiovascular Disease | Admitting: Student

## 2023-04-22 ENCOUNTER — Ambulatory Visit (INDEPENDENT_AMBULATORY_CARE_PROVIDER_SITE_OTHER): Payer: Medicare PPO

## 2023-04-22 ENCOUNTER — Encounter: Payer: Self-pay | Admitting: Student

## 2023-04-22 ENCOUNTER — Telehealth: Payer: Self-pay | Admitting: Pharmacist

## 2023-04-22 VITALS — BP 150/80 | HR 61

## 2023-04-22 DIAGNOSIS — I10 Essential (primary) hypertension: Secondary | ICD-10-CM | POA: Diagnosis not present

## 2023-04-22 DIAGNOSIS — R002 Palpitations: Secondary | ICD-10-CM

## 2023-04-22 MED ORDER — AMLODIPINE BESYLATE 5 MG PO TABS
5.0000 mg | ORAL_TABLET | Freq: Every day | ORAL | 3 refills | Status: DC
Start: 2023-04-22 — End: 2023-06-28

## 2023-04-22 NOTE — Progress Notes (Unsigned)
Enrolled for [employer] to mail a ZIO XT long term holter monitor to the patients address on file.  

## 2023-04-22 NOTE — Assessment & Plan Note (Signed)
Assessment: BP is uncontrolled in office BP 150/80 mmHg above the goal (<130/80) Home BP 169/91 highest 187/92 heart rate ~78 Tolerates metoprolol 12.5 mg twice daily well without any side effects Denies SOB, palpitation, chest pain, headaches,or swelling Reports having skipped heart beat - Exercise regularly, need improvement in salt intake; planing to go on weight loss diet starting from Jan 2025  Given stage 2 hypertension reasonable to add 2 antihypertensive but patient is willing to implement some lifestyle innervations (reduce salt and EtOH intake) with one antihypertensive   Plan:  Start taking amlodipine 5 mg daily  Continue taking metoprolol 12.5 mg twice daily  Patient to keep record of BP readings with heart rate and report to [name] at the next visit Patient to see PharmD in 5 weeks for follow up  Phone f/u on home BP readings on Jan 10 if BP still elevated will add ARB or thiazide to current CCB ( electrolytes and renal function stable) Follow up lab(s) : none

## 2023-04-22 NOTE — Addendum Note (Signed)
Addended by: Franchot Gallo on: 04/22/2023 12:23 PM   Modules accepted: Orders

## 2023-04-22 NOTE — Progress Notes (Signed)
Patient ID: Shane Wood                 DOB: 12/06/1955                      MRN: [id number]      HPI: Shane Wood is a 67 y.o. male referred by Dr. Rosemary Wood to HTN clinic. PMH is significant for hypertension, hyperlipidemia, CAD, hx of NSTEMI  2 separate blood pressure readings elevated so patient was advised to keep home BP log and bring it to PharmD at follow up visit   Today patient presented for hypertension clinic. Brought in home BP log, BP ~ 169/91 heart rate 78. Reports his heart beat is not regular- skips the beats sometimes and lately noticing more. Denies any SOB, palpitation, chest pain or swelling. He has been watching salt intake but still need improvement. Reports he occasionally have salty snacks ( salty nuts and chips) uses can food. He exercise regularly - stationary bike 1 heart rate per day and weight training at gym 2-3 times per week and walks his dog 1 mile everyday. He enjoys 3-4 cups of black coffee and 2 drinks of whisky every day. He admits that he could improve on his water intake. He has lost weight in the past with lifestyle interventions but he gained it right back. Next week he has appointment with nutritionist to get guidance on weight loss.    Current HTN meds: metoprolol tartrate 12.5 mg twice daily   Previously tried: none  BP goal: <130/80    Family History:  Relation Problem Comments  Mother Metallurgist) Depression   Hypertension   Obesity     Father (Deceased) Lung cancer smoker    Maternal Grandmother (Deceased) Diabetes     Maternal Grandfather (Deceased) Diabetes     Paternal Grandmother (Deceased)   Paternal Grandfather (Deceased)   Other Heart attack uncle MI at age 12s    Other Stroke GM, in her 31      Social History:  Alcohol: 2 shots per day  Smoking: quit 30  yrs ago  Recreational drug use: never   Diet: low sat diet but still have canned food  Ocassionaly snack on salted nuts and chips  Drink: coffee 3-4 cups per day,  water -  Exercise: 60 min stationary bike daily, weight training 2-3 times per week     Home BP readings: 169/91 highest 187/92 heart rate ~78  Wt Readings from Last 3 Encounters:  03/07/23 242 lb 6.4 oz (110 kg)  01/14/23 239 lb 2 oz (108.5 kg)  07/25/22 233 lb 6.4 oz (105.9 kg)   BP Readings from Last 3 Encounters:  04/22/23 (!) 150/80  03/07/23 (!) 132/94  01/14/23 126/64   Pulse Readings from Last 3 Encounters:  04/22/23 61  03/07/23 76  01/14/23 (!) 58    Renal function: CrCl cannot be calculated (Patient's most recent lab result is older than the maximum 21 days allowed.).  Past Medical History:  Diagnosis Date   Allergy    BACK PAIN    ELEVATED BP READING WITHOUT DX HYPERTENSION 05/03/2008   Qualifier: Diagnosis of  By: Shane Novel MD, Nolon Rod.    GERD (gastroesophageal reflux disease)    H/O cardiovascular stress test 2005    (-)   Hyperlipidemia    INSOMNIA-SLEEP DISORDER-UNSPEC    PVC (premature ventricular contraction)     Current Outpatient Medications on File Prior to Visit  Medication Sig Dispense Refill  Ascorbic Acid (VITAMIN C) 1000 MG tablet Take 1,000 mg by mouth every evening.     B Complex Vitamins (VITAMIN B-COMPLEX) TABS Take 1 tablet by mouth daily.     Cholecalciferol (VITAMIN D) 125 MCG (5000 UT) CAPS Take 1 capsule by mouth in the morning.     clopidogrel (PLAVIX) 75 MG tablet TAKE 1 TABLET BY MOUTH EVERY DAY 90 tablet 3   Cyanocobalamin (VITAMIN B 12 PO) Take 1 tablet by mouth every evening.     ezetimibe (ZETIA) 10 MG tablet TAKE 1 TABLET BY MOUTH EVERY DAY 90 tablet 3   metoprolol tartrate (LOPRESSOR) 25 MG tablet TAKE HALF A TABLET BY MOUTH 2 TIMES DAILY. 90 tablet 3   Multiple Vitamin (MULTIVITAMIN) tablet Take 1 tablet by mouth every evening.     nitroGLYCERIN (NITROSTAT) 0.4 MG SL tablet Place 1 tablet (0.4 mg total) under the tongue every 5 (five) minutes x 3 doses as needed for chest pain. 25 tablet 3   pantoprazole (PROTONIX) 40 MG tablet  TAKE 1 TABLET BY MOUTH EVERY DAY 90 tablet 3   Probiotic Product (PROBIOTIC DAILY PO) Take by mouth.     rosuvastatin (CRESTOR) 40 MG tablet Take 1 tablet (40 mg total) by mouth at bedtime. 90 tablet 2   No current facility-administered medications on file prior to visit.    No Known Allergies  Blood pressure (!) 150/80, pulse 61, SpO2 98%.   Assessment/Plan:  1. Hypertension -  Essential (primary) hypertension Assessment: BP is uncontrolled in office BP 150/80 mmHg above the goal (<130/80) Home BP 169/91 highest 187/92 heart rate ~78 Tolerates metoprolol 12.5 mg twice daily well without any side effects Denies SOB, palpitation, chest pain, headaches,or swelling Reports having skipped heart beat - Exercise regularly, need improvement in salt intake; planing to go on weight loss diet starting from Jan 2025  Given stage 2 hypertension reasonable to add 2 antihypertensive but patient is willing to implement some lifestyle innervations (reduce salt and EtOH intake) with one antihypertensive   Plan:  Start taking amlodipine 5 mg daily  Continue taking metoprolol 12.5 mg twice daily  Patient to keep record of BP readings with heart rate and report to [name] at the next visit Patient to see PharmD in 5 weeks for follow up  Phone f/u on home BP readings on Jan 10 if BP still elevated will add ARB or thiazide to current CCB ( electrolytes and renal function stable) Follow up lab(s) : none     Thank you  Carmela Hurt, Pharm.D [facility] A Division of [facility] [address], [place], [place] [address]  Phone: [phone]; Fax: [phone]

## 2023-04-22 NOTE — Telephone Encounter (Signed)
Discussed Dr. Damian Leavell recommendation for heart monitor with patient:  Thank you. Can we get him on 2 week Zio for palpitations please?   Thanks MJP      2 week Zio will be mailed to patient's home. Patient verbalized understanding.

## 2023-04-22 NOTE — Patient Instructions (Signed)
Changes made by your pharmacist Carmela Hurt, PharmD at today's visit:    Instructions/Changes  (what do you need to do) Your Notes  (what you did and when you did it)  Continue taking metoprolol 12.5 mg twice daily and start taking amlodipine 5 mg daily    Cut down on salt intake    Exercise regularly     Bring all of your meds, your BP cuff and your record of home blood pressures to your next appointment.    HOW TO TAKE YOUR BLOOD PRESSURE AT HOME  Rest 5 minutes before taking your blood pressure.  Don't smoke or drink caffeinated beverages for at least 30 minutes before. Take your blood pressure before (not after) you eat. Sit comfortably with your back supported and both feet on the floor (don't cross your legs). Elevate your arm to heart level on a table or a desk. Use the proper sized cuff. It should fit smoothly and snugly around your bare upper arm. There should be enough room to slip a fingertip under the cuff. The bottom edge of the cuff should be 1 inch above the crease of the elbow. Ideally, take 3 measurements at one sitting and record the average.  Important lifestyle changes to control high blood pressure  Intervention  Effect on the BP  Lose extra pounds and watch your waistline Weight loss is one of the most effective lifestyle changes for controlling blood pressure. If you're overweight or obese, losing even a small amount of weight can help reduce blood pressure. Blood pressure might go down by about 1 millimeter of mercury (mm Hg) with each kilogram (about 2.2 pounds) of weight lost.  Exercise regularly As a general goal, aim for at least 30 minutes of moderate physical activity every day. Regular physical activity can lower high blood pressure by about 5 to 8 mm Hg.  Eat a healthy diet Eating a diet rich in whole grains, fruits, vegetables, and low-fat dairy products and low in saturated fat and cholesterol. A healthy diet can lower high blood pressure by up to  11 mm Hg.  Reduce salt (sodium) in your diet Even a small reduction of sodium in the diet can improve heart health and reduce high blood pressure by about 5 to 6 mm Hg.  Limit alcohol One drink equals 12 ounces of beer, 5 ounces of wine, or 1.5 ounces of 80-proof liquor.  Limiting alcohol to less than one drink a day for women or two drinks a day for men can help lower blood pressure by about 4 mm Hg.   If you have any questions or concerns please use My Chart to send questions or call the office at [phone]

## 2023-04-22 NOTE — Telephone Encounter (Signed)
Thank you. Can we get him on 2 week Zio for palpitations please?  Thanks MJP

## 2023-04-22 NOTE — Telephone Encounter (Signed)
Saw patient today for hypertension. He reports lately he notices skipped heart beats more often than before.Will route it to Dr.Patwardhan further assessment

## 2023-04-25 ENCOUNTER — Encounter: Payer: Self-pay | Admitting: Cardiology

## 2023-04-25 DIAGNOSIS — R002 Palpitations: Secondary | ICD-10-CM

## 2023-04-25 DIAGNOSIS — I493 Ventricular premature depolarization: Secondary | ICD-10-CM

## 2023-04-25 DIAGNOSIS — I252 Old myocardial infarction: Secondary | ICD-10-CM

## 2023-04-26 NOTE — Telephone Encounter (Signed)
 Yes. 30 day please.  04/26/23

## 2023-04-29 ENCOUNTER — Ambulatory Visit: Payer: Medicare PPO | Attending: Cardiology

## 2023-04-29 DIAGNOSIS — R002 Palpitations: Secondary | ICD-10-CM

## 2023-04-29 DIAGNOSIS — I493 Ventricular premature depolarization: Secondary | ICD-10-CM

## 2023-04-29 DIAGNOSIS — I252 Old myocardial infarction: Secondary | ICD-10-CM

## 2023-04-29 NOTE — Telephone Encounter (Signed)
 I spoke with the patient, He is open to trying 2 week Zio monitor for now. I am in agreement with this. No further action needed at this time.   Thanks MJP

## 2023-04-29 NOTE — Progress Notes (Unsigned)
 Enrolled again  for [employer] to mail a ZIO XT long term holter monitor to the patients address on file. Unaware if patient has returned initial ZIO monitor he declined.

## 2023-04-29 NOTE — Addendum Note (Signed)
 Addended by: Loa Socks on: 04/29/2023 04:15 PM   Modules accepted: Orders

## 2023-04-30 DIAGNOSIS — R002 Palpitations: Secondary | ICD-10-CM

## 2023-05-15 DIAGNOSIS — R002 Palpitations: Secondary | ICD-10-CM | POA: Diagnosis not present

## 2023-05-15 NOTE — Progress Notes (Signed)
No significant heart rhythm abnormalities noted. 1 very brief episode of rapid heartbeat from top chamber of the heart, lasting only for 5 beats. <1% ectopies from bottom chamber of the heart (PVCs), that did correlate with 3 episodes of patient symptoms. Generally, such low percentage of PVCs can be monitored without any further changes. If symptoms worsen, could increase metoprolol dose.  Thanks MJP

## 2023-05-31 ENCOUNTER — Encounter: Payer: Self-pay | Admitting: Student

## 2023-05-31 ENCOUNTER — Ambulatory Visit: Payer: Medicare PPO | Attending: Cardiovascular Disease | Admitting: Student

## 2023-05-31 VITALS — BP 152/79 | HR 80 | Ht 71.0 in | Wt 220.0 lb

## 2023-05-31 DIAGNOSIS — I10 Essential (primary) hypertension: Secondary | ICD-10-CM | POA: Diagnosis not present

## 2023-05-31 MED ORDER — IRBESARTAN 150 MG PO TABS: 150.0000 mg | ORAL_TABLET | Freq: Every day | ORAL | 3 refills | Status: DC

## 2023-05-31 MED ORDER — CARVEDILOL 6.25 MG PO TABS: 6.2500 mg | ORAL_TABLET | Freq: Two times a day (BID) | ORAL | 3 refills | Status: DC

## 2023-05-31 NOTE — Patient Instructions (Signed)
 Changes made by your pharmacist Robbi Blanch, PharmD at today's visit:    Instructions/Changes  (what do you need to do) Your Notes  (what you did and when you did it)  Start taking irbesartan  150 mg daily and carvedilol  6.25 mg twice daily Stop taking metoprolol  12.5 mg twice daily    Continue taking amlodipine  5 mg daily    Get the lab done in 3 weeks of starting irbesartan  150 mg     Bring all of your meds, your BP cuff and your record of home blood pressures to your next appointment.    HOW TO TAKE YOUR BLOOD PRESSURE AT HOME  Rest 5 minutes before taking your blood pressure.  Don't smoke or drink caffeinated beverages for at least 30 minutes before. Take your blood pressure before (not after) you eat. Sit comfortably with your back supported and both feet on the floor (don't cross your legs). Elevate your arm to heart level on a table or a desk. Use the proper sized cuff. It should fit smoothly and snugly around your bare upper arm. There should be enough room to slip a fingertip under the cuff. The bottom edge of the cuff should be 1 inch above the crease of the elbow. Ideally, take 3 measurements at one sitting and record the average.  Important lifestyle changes to control high blood pressure  Intervention  Effect on the BP  Lose extra pounds and watch your waistline Weight loss is one of the most effective lifestyle changes for controlling blood pressure. If you're overweight or obese, losing even a small amount of weight can help reduce blood pressure. Blood pressure might go down by about 1 millimeter of mercury (mm Hg) with each kilogram (about 2.2 pounds) of weight lost.  Exercise regularly As a general goal, aim for at least 30 minutes of moderate physical activity every day. Regular physical activity can lower high blood pressure by about 5 to 8 mm Hg.  Eat a healthy diet Eating a diet rich in whole grains, fruits, vegetables, and low-fat dairy products and low in  saturated fat and cholesterol. A healthy diet can lower high blood pressure by up to 11 mm Hg.  Reduce salt (sodium) in your diet Even a small reduction of sodium in the diet can improve heart health and reduce high blood pressure by about 5 to 6 mm Hg.  Limit alcohol One drink equals 12 ounces of beer, 5 ounces of wine, or 1.5 ounces of 80-proof liquor.  Limiting alcohol to less than one drink a day for women or two drinks a day for men can help lower blood pressure by about 4 mm Hg.   If you have any questions or concerns please use My Chart to send questions or call the office at [phone]

## 2023-05-31 NOTE — Assessment & Plan Note (Signed)
 Assessment: BP is uncontrolled in office BP 152/79 mmHg above the goal (<130/80) Home BP  ~ ~ 164/86  heart rate high 70's Tolerates metoprolol  12.5 mg twice daily and amlodipine  5 mg daily well without any side effects Denies SOB, palpitation, chest pain, headaches,or swelling Exercise regularly, watches salt intake   Given stage 2 hypertension reasonable to add 2 antihypertensive will change current betablocker to carvedilol  to better better BP lowering effect and add ARB Plan:  Stop taking metoprolol  12.5 mg twice daily and start taking carvedilol  6.25 mg twice daily and irbesartan  150 mg daily  Continue  taking amlodipine  5 mg daily  Patient to bring BP monitor for validation at next OV  Patient to keep record of BP readings with heart rate and report to us  at the next visit Patient to see PharmD in 4 weeks for follow up  Follow up lab(s) : BMP in 3 weeks of starting irbesartan 

## 2023-05-31 NOTE — Progress Notes (Signed)
 Patient ID: Shane Wood                 DOB: 25-Nov-1955                      MRN: [id number]      HPI: Shane Wood is a 68 y.o. male referred by Dr. Elmira to HTN clinic. PMH is significant for hypertension, hyperlipidemia, CAD, hx of NSTEMI  2 separate blood pressure readings elevated so patient was advised to keep home BP log and bring it to PharmD at follow up visit. At last visit with PharmD BP was elevated (150/80-on 04/22/23) amlodipine  5 mg was added to metoprolol  12.5 mg twice daily. Pt also reported skipped heart beats - Zio patch was ordered by Dr.Patwardhan.    Today patient presented for for follow up. Home BP ~ 164/86  heart rate high 70's reports he has cut down on salt intake, switched afternoon coffee to decaf kind  and exercise regularly as reported last time. Tolerates current BP medications well. He reports he does not wait for 5 min before checking BP at home. [place] going [place] of [place] on [place] 11 so wants to come back for follow up before leaving country.   Current HTN meds: carvedilol  6.25 mg twice daily, Irbesartan  150 mg daily, and amlodipine  5 mg daily Previously tried: none  BP goal: <130/80    Family History:  Relation Problem Comments  Mother Metallurgist) Depression   Hypertension   Obesity     Father (Deceased) Lung cancer smoker    Maternal Grandmother (Deceased) Diabetes     Maternal Grandfather (Deceased) Diabetes     Paternal Grandmother (Deceased)   Paternal Grandfather (Deceased)   Other Heart attack uncle MI at age 74s    Other Stroke GM, in her 64      Social History:  Alcohol: 2 shots per day  Smoking: quit 30  yrs ago  Recreational drug use: never   Diet: low sat diet but still have canned food  Ocassionaly snack on salted nuts and chips  Drink: coffee 3-4 cups per day, water  -  Exercise: 60 min stationary bike daily, weight training 2-3 times per week     Home BP readings: 169/91 highest 187/92 heart rate ~78  Wt  Readings from Last 3 Encounters:  05/31/23 220 lb (99.8 kg)  03/07/23 242 lb 6.4 oz (110 kg)  01/14/23 239 lb 2 oz (108.5 kg)   BP Readings from Last 3 Encounters:  05/31/23 (!) 152/79  04/22/23 (!) 150/80  03/07/23 (!) 132/94   Pulse Readings from Last 3 Encounters:  05/31/23 80  04/22/23 61  03/07/23 76    Renal function: CrCl cannot be calculated (Patient's most recent lab result is older than the maximum 21 days allowed.).  Past Medical History:  Diagnosis Date   Allergy    BACK PAIN    ELEVATED BP READING WITHOUT DX HYPERTENSION 05/03/2008   Qualifier: Diagnosis of  By: Amon MD, Aloysius BRAVO.    GERD (gastroesophageal reflux disease)    H/O cardiovascular stress test 2005    (-)   Hyperlipidemia    INSOMNIA-SLEEP DISORDER-UNSPEC    PVC (premature ventricular contraction)     Current Outpatient Medications on File Prior to Visit  Medication Sig Dispense Refill   amLODipine  (NORVASC ) 5 MG tablet Take 1 tablet (5 mg total) by mouth daily. 180 tablet 3   Ascorbic Acid (VITAMIN C) 1000 MG tablet  Take 1,000 mg by mouth every evening.     B Complex Vitamins (VITAMIN B-COMPLEX) TABS Take 1 tablet by mouth daily.     Cholecalciferol (VITAMIN D ) 125 MCG (5000 UT) CAPS Take 1 capsule by mouth in the morning.     clopidogrel  (PLAVIX ) 75 MG tablet TAKE 1 TABLET BY MOUTH EVERY DAY 90 tablet 3   Cyanocobalamin  (VITAMIN B 12 PO) Take 1 tablet by mouth every evening.     ezetimibe  (ZETIA ) 10 MG tablet TAKE 1 TABLET BY MOUTH EVERY DAY 90 tablet 3   Multiple Vitamin (MULTIVITAMIN) tablet Take 1 tablet by mouth every evening.     nitroGLYCERIN  (NITROSTAT ) 0.4 MG SL tablet Place 1 tablet (0.4 mg total) under the tongue every 5 (five) minutes x 3 doses as needed for chest pain. 25 tablet 3   pantoprazole  (PROTONIX ) 40 MG tablet TAKE 1 TABLET BY MOUTH EVERY DAY 90 tablet 3   Probiotic Product (PROBIOTIC DAILY PO) Take by mouth.     rosuvastatin  (CRESTOR ) 40 MG tablet Take 1 tablet (40 mg total)  by mouth at bedtime. 90 tablet 2   No current facility-administered medications on file prior to visit.    No Known Allergies  Blood pressure (!) 152/79, pulse 80, height 5' 11 (1.803 m), weight 220 lb (99.8 kg), SpO2 98%.   Assessment/Plan:  1. Hypertension -  Essential (primary) hypertension Assessment: BP is uncontrolled in office BP 152/79 mmHg above the goal (<130/80) Home BP  ~ ~ 164/86  heart rate high 70's Tolerates metoprolol  12.5 mg twice daily and amlodipine  5 mg daily well without any side effects Denies SOB, palpitation, chest pain, headaches,or swelling Exercise regularly, watches salt intake   Given stage 2 hypertension reasonable to add 2 antihypertensive will change current betablocker to carvedilol  to better better BP lowering effect and add ARB Plan:  Stop taking metoprolol  12.5 mg twice daily and start taking carvedilol  6.25 mg twice daily and irbesartan  150 mg daily  Continue  taking amlodipine  5 mg daily  Patient to bring BP monitor for validation at next OV  Patient to keep record of BP readings with heart rate and report to us  at the next visit Patient to see PharmD in 4 weeks for follow up  Follow up lab(s) : BMP in 3 weeks of starting irbesartan      Thank you  Robbi Blanch, Pharm.D [facility] A Division of [facility] [address] [place], [place] [address]  Phone: [phone]; Fax: [phone]

## 2023-06-24 ENCOUNTER — Encounter: Payer: Self-pay | Admitting: Internal Medicine

## 2023-06-28 ENCOUNTER — Ambulatory Visit: Payer: Medicare PPO | Attending: Internal Medicine | Admitting: Pharmacist

## 2023-06-28 ENCOUNTER — Encounter: Payer: Self-pay | Admitting: Pharmacist

## 2023-06-28 VITALS — BP 132/79 | HR 60

## 2023-06-28 DIAGNOSIS — I10 Essential (primary) hypertension: Secondary | ICD-10-CM

## 2023-06-28 MED ORDER — AMLODIPINE BESYLATE 5 MG PO TABS: 5.0000 mg | ORAL_TABLET | Freq: Every day | ORAL | 3 refills | Status: DC

## 2023-06-28 NOTE — Progress Notes (Signed)
 Patient ID: [name]                 DOB: 1955-08-27                      MRN: [id number]      HPI: Shane Wood is a 68 y.o. male referred by Dr. Rosemary Wood to HTN clinic. PMH is significant for hypertension, hyperlipidemia, CAD, hx of NSTEMI  2 separate blood pressure readings elevated so patient was advised to keep home BP log and bring it to PharmD at follow up visit. At last visit with PharmD BP was elevated (150/80-on 04/22/23) amlodipine 5 mg was added to metoprolol 12.5 mg twice daily. Pt also reported skipped heart beats - Zio patch was ordered by Dr.Patwardhan.  at last visit with me BP was elevated so irbesartan 150 mg was added to amlodipine and metoprolol was changed to Carvedilol for better BP response.  Today patient presented for for follow up. Reports he does not check BP at home from last 2 weeks. Feels great. Has been following low salt diet and exercising regularly. Tolerates new BP medications well. Forgot to get BMP check. Will go to lab today. He figured out that Airborne was causing occasional palpitation so he has stopped that supplement.    Current HTN meds: carvedilol 6.25 mg twice daily, Irbesartan 150 mg daily, and amlodipine 5 mg daily Previously tried: none  BP goal: <130/80     Family History:  Relation Problem Comments  Mother Metallurgist) Depression   Hypertension   Obesity     Father (Deceased) Lung cancer smoker    Maternal Grandmother (Deceased) Diabetes     Maternal Grandfather (Deceased) Diabetes     Paternal Grandmother (Deceased)   Paternal Grandfather (Deceased)   Other Heart attack uncle MI at age 65s    Other Stroke GM, in her 71      Social History:  Alcohol: 2 shots per day  Smoking: quit 30  yrs ago  Recreational drug use: never   Diet: low sat diet but still have canned food  Ocassionaly snack on salted nuts and chips  Drink: coffee 3-4 cups per day, water -  Exercise: 60 min stationary bike daily, weight training 2-3  times per week     Home BP readings: not been checking from last 2 weeks   Wt Readings from Last 3 Encounters:  05/31/23 220 lb (99.8 kg)  03/07/23 242 lb 6.4 oz (110 kg)  01/14/23 239 lb 2 oz (108.5 kg)   BP Readings from Last 3 Encounters:  06/28/23 132/79  05/31/23 (!) 152/79  04/22/23 (!) 150/80   Pulse Readings from Last 3 Encounters:  06/28/23 60  05/31/23 80  04/22/23 61    Renal function: CrCl cannot be calculated (Patient's most recent lab result is older than the maximum 21 days allowed.).  Past Medical History:  Diagnosis Date   Allergy    BACK PAIN    ELEVATED BP READING WITHOUT DX HYPERTENSION 05/03/2008   Qualifier: Diagnosis of  By: Shane Novel MD, Shane Wood.    GERD (gastroesophageal reflux disease)    H/O cardiovascular stress test 2005    (-)   Hyperlipidemia    INSOMNIA-SLEEP DISORDER-UNSPEC    PVC (premature ventricular contraction)     Current Outpatient Medications on File Prior to Visit  Medication Sig Dispense Refill   Ascorbic Acid (VITAMIN C) 1000 MG tablet Take 1,000 mg by mouth every evening.  B Complex Vitamins (VITAMIN B-COMPLEX) TABS Take 1 tablet by mouth daily.     carvedilol (COREG) 6.25 MG tablet Take 1 tablet (6.25 mg total) by mouth 2 (two) times daily. 180 tablet 3   Cholecalciferol (VITAMIN D) 125 MCG (5000 UT) CAPS Take 1 capsule by mouth in the morning.     clopidogrel (PLAVIX) 75 MG tablet TAKE 1 TABLET BY MOUTH EVERY DAY 90 tablet 3   Cyanocobalamin (VITAMIN B 12 PO) Take 1 tablet by mouth every evening.     ezetimibe (ZETIA) 10 MG tablet TAKE 1 TABLET BY MOUTH EVERY DAY 90 tablet 3   irbesartan (AVAPRO) 150 MG tablet Take 1 tablet (150 mg total) by mouth daily. 90 tablet 3   Multiple Vitamin (MULTIVITAMIN) tablet Take 1 tablet by mouth every evening.     nitroGLYCERIN (NITROSTAT) 0.4 MG SL tablet Place 1 tablet (0.4 mg total) under the tongue every 5 (five) minutes x 3 doses as needed for chest pain. 25 tablet 3   pantoprazole  (PROTONIX) 40 MG tablet TAKE 1 TABLET BY MOUTH EVERY DAY 90 tablet 3   Probiotic Product (PROBIOTIC DAILY PO) Take by mouth.     rosuvastatin (CRESTOR) 40 MG tablet Take 1 tablet (40 mg total) by mouth at bedtime. 90 tablet 2   No current facility-administered medications on file prior to visit.    No Known Allergies  Blood pressure 132/79, pulse 60, SpO2 99%.   Assessment/Plan:  1. Hypertension -  Essential (primary) hypertension Assessment: BP is controlled in office BP 132/79 mmHg heart rate 60 (goal <130/80) Tolerates carvedilol 6.25 mg twice daily, Irbesartan 150 mg daily, and amlodipine 5 mg daily  well without any side effects Denies SOB, palpitation, chest pain, headaches,or swelling Exercise regularly, watches salt intake   Will get updated BMP today post ARB addition  Plan:  Continue  taking carvedilol 6.25 mg twice daily, Irbesartan 150 mg daily, and amlodipine 5 mg daily  Patient to keep record of BP readings with heart rate and report to [name] at the next visit Patient to see PharmD in 5-6 weeks for follow up  Follow up lab(s) : BMP today     Thank you  Shane Wood, Pharm.D [facility] A Division of [facility] [address], [place], [place] [address]  Phone: [phone]; Fax: [phone]

## 2023-06-28 NOTE — Assessment & Plan Note (Signed)
 Assessment: BP is controlled in office BP 132/79 mmHg heart rate 60 (goal <130/80) Tolerates carvedilol 6.25 mg twice daily, Irbesartan 150 mg daily, and amlodipine 5 mg daily  well without any side effects Denies SOB, palpitation, chest pain, headaches,or swelling Exercise regularly, watches salt intake   Will get updated BMP today post ARB addition  Plan:  Continue  taking carvedilol 6.25 mg twice daily, Irbesartan 150 mg daily, and amlodipine 5 mg daily  Patient to keep record of BP readings with heart rate and report to [name] at the next visit Patient to see PharmD in 5-6 weeks for follow up  Follow up lab(s) : BMP today

## 2023-06-29 LAB — BASIC METABOLIC PANEL
BUN/Creatinine Ratio: 21 (ref 10–24)
BUN: 23 mg/dL (ref 8–27)
CO2: 23 mmol/L (ref 20–29)
Calcium: 9.7 mg/dL (ref 8.6–10.2)
Chloride: 106 mmol/L (ref 96–106)
Creatinine, Ser: 1.1 mg/dL (ref 0.76–1.27)
Glucose: 99 mg/dL (ref 70–99)
Potassium: 5.5 mmol/L — ABNORMAL HIGH (ref 3.5–5.2)
Sodium: 144 mmol/L (ref 134–144)
eGFR: 73 mL/min/{1.73_m2} (ref >59)

## 2023-07-03 ENCOUNTER — Telehealth: Payer: Self-pay

## 2023-07-03 DIAGNOSIS — Z79899 Other long term (current) drug therapy: Secondary | ICD-10-CM

## 2023-07-03 DIAGNOSIS — E875 Hyperkalemia: Secondary | ICD-10-CM

## 2023-07-03 NOTE — Telephone Encounter (Signed)
-----   Message from [facility] sent at 07/03/2023  5:06 PM EDT ----- Potassium 5.5 on this BMP.  Irbesartan could be contributing.  Before changing irbesartan dose, recommend reducing potassium intake-foods like banana, broccoli, avocado, potato have high potassium.  Repeat BMP in 2 weeks.  Thanks MJP

## 2023-07-03 NOTE — Progress Notes (Signed)
 Potassium 5.5 on this BMP.  Irbesartan could be contributing.  Before changing irbesartan dose, recommend reducing potassium intake-foods like banana, broccoli, avocado, potato have high potassium.  Repeat BMP in 2 weeks.  Thanks MJP

## 2023-07-03 NOTE — Telephone Encounter (Signed)
 Spoke with pt regarding his potassium. Pt was told his potassium came back high and that he should reduce his intake of potassium rich foods. Pt was told to go to any [facility] in 2 weeks for a repeat BMP. Pt verbalized understanding. All questions, if any, were answered. A BMP was ordered and released.

## 2023-07-04 ENCOUNTER — Other Ambulatory Visit: Payer: Self-pay | Admitting: Interventional Cardiology

## 2023-08-19 DIAGNOSIS — Z79899 Other long term (current) drug therapy: Secondary | ICD-10-CM | POA: Diagnosis not present

## 2023-08-19 DIAGNOSIS — E875 Hyperkalemia: Secondary | ICD-10-CM | POA: Diagnosis not present

## 2023-08-19 LAB — BASIC METABOLIC PANEL WITH GFR
BUN/Creatinine Ratio: 19 (ref 10–24)
BUN: 20 mg/dL (ref 8–27)
CO2: 22 mmol/L (ref 20–29)
Calcium: 9.2 mg/dL (ref 8.6–10.2)
Chloride: 106 mmol/L (ref 96–106)
Creatinine, Ser: 1.08 mg/dL (ref 0.76–1.27)
Glucose: 96 mg/dL (ref 70–99)
Potassium: 5.1 mmol/L (ref 3.5–5.2)
Sodium: 143 mmol/L (ref 134–144)
eGFR: 75 mL/min/{1.73_m2} (ref >59)

## 2023-08-20 ENCOUNTER — Encounter: Payer: Self-pay | Admitting: Cardiology

## 2023-08-21 ENCOUNTER — Ambulatory Visit: Attending: Cardiovascular Disease | Admitting: Pharmacist

## 2023-08-21 ENCOUNTER — Encounter: Payer: Self-pay | Admitting: Pharmacist

## 2023-08-21 DIAGNOSIS — I10 Essential (primary) hypertension: Secondary | ICD-10-CM

## 2023-08-21 MED ORDER — IRBESARTAN 300 MG PO TABS: 300.0000 mg | ORAL_TABLET | Freq: Every day | ORAL | 3 refills | Status: DC

## 2023-08-21 NOTE — Assessment & Plan Note (Addendum)
 Assessment: BP is controlled in office BP 133/73 mmHg heart rate 67 (goal <130/80) Tolerates carvedilol  6.25 mg twice daily, Irbesartan  150 mg daily, and amlodipine  5 mg daily  well without any side effects Denies SOB, palpitation, chest pain, headaches,or swelling Exercise regularly, watches salt intake    Plan:  Increase irbesartan  dose from 150 mg to 300 mg daily  Continue  taking carvedilol  6.25 mg twice daily, amlodipine  5 mg daily  Patient to keep record of BP readings with heart rate and report to us  at the next visit Patient to see PharmD in 6-8 weeks for follow up  Follow up lab(s) : BMP in 3-4 weeks

## 2023-08-21 NOTE — Patient Instructions (Addendum)
 Changes made by your pharmacist Nickola Baron, PharmD at today's visit:    Instructions/Changes  (what do you need to do) Your Notes  (what you did and when you did it)  Increase irbesartan  dose from 150 mg daily to 300 mg daily    Continue taking carvedilol  6.25 mg twice daily, amlodipine  5 mg daily    Bring all of your meds, your BP cuff and your record of home blood pressures to your next appointment.    HOW TO TAKE YOUR BLOOD PRESSURE AT HOME  Rest 5 minutes before taking your blood pressure.  Don't smoke or drink caffeinated beverages for at least 30 minutes before. Take your blood pressure before (not after) you eat. Sit comfortably with your back supported and both feet on the floor (don't cross your legs). Elevate your arm to heart level on a table or a desk. Use the proper sized cuff. It should fit smoothly and snugly around your bare upper arm. There should be enough room to slip a fingertip under the cuff. The bottom edge of the cuff should be 1 inch above the crease of the elbow. Ideally, take 3 measurements at one sitting and record the average.  Important lifestyle changes to control high blood pressure  Intervention  Effect on the BP  Lose extra pounds and watch your waistline Weight loss is one of the most effective lifestyle changes for controlling blood pressure. If you're overweight or obese, losing even a small amount of weight can help reduce blood pressure. Blood pressure might go down by about 1 millimeter of mercury (mm Hg) with each kilogram (about 2.2 pounds) of weight lost.  Exercise regularly As a general goal, aim for at least 30 minutes of moderate physical activity every day. Regular physical activity can lower high blood pressure by about 5 to 8 mm Hg.  Eat a healthy diet Eating a diet rich in whole grains, fruits, vegetables, and low-fat dairy products and low in saturated fat and cholesterol. A healthy diet can lower high blood pressure by up to 11 mm  Hg.  Reduce salt (sodium) in your diet Even a small reduction of sodium in the diet can improve heart health and reduce high blood pressure by about 5 to 6 mm Hg.  Limit alcohol One drink equals 12 ounces of beer, 5 ounces of wine, or 1.5 ounces of 80-proof liquor.  Limiting alcohol to less than one drink a day for women or two drinks a day for men can help lower blood pressure by about 4 mm Hg.   If you have any questions or concerns please use My Chart to send questions or call the office at [phone]

## 2023-08-21 NOTE — Progress Notes (Signed)
 Patient ID: Shane Wood                 DOB: 1955/10/06                      MRN: [id number]      HPI: Shane Wood is a 68 y.o. male referred by Dr. Filiberto Hug to HTN clinic. PMH is significant for hypertension, hyperlipidemia, CAD, hx of NSTEMI  2 separate blood pressure readings elevated so patient was advised to keep home BP log and bring it to PharmD at follow up visit. At last visit with PharmD BP was elevated (150/80-on 04/22/23) amlodipine  5 mg was added to metoprolol  12.5 mg twice daily. Pt also reported skipped heart beats - Zio patch was ordered by Dr.Patwardhan.  at last visit with me BP was elevated so irbesartan  150 mg was added to amlodipine  and metoprolol  was changed to Carvedilol  for better BP response. Irbesartan  was added to other BP meds to lower BP to goal post Aavapro initiation BMP was WNL.  Today patient presented for for follow up. Reports he does not check BP at home at all. Denies dizziness,HA, chest pain, SOB. Has been exercising regularly and follows low salt diet.    Current HTN meds: carvedilol  6.25 mg twice daily, Irbesartan  300 mg daily, and amlodipine  5 mg daily Previously tried: none  BP goal: <130/80     Family History:  Relation Problem Comments  Mother Metallurgist) Depression   Hypertension   Obesity     Father (Deceased) Lung cancer smoker    Maternal Grandmother (Deceased) Diabetes     Maternal Grandfather (Deceased) Diabetes     Paternal Grandmother (Deceased)   Paternal Grandfather (Deceased)   Other Heart attack uncle MI at age 19s    Other Stroke GM, in her 69      Social History:  Alcohol: 2 shots per day  Smoking: quit 30  yrs ago  Recreational drug use: never   Diet: low sat diet but still have canned food  Ocassionaly snack on salted nuts and chips  Drink: coffee 3-4 cups per day, water  - rest of the day   Exercise: 60 min stationary bike daily, weight training 2-3 times per week     Home BP readings: does not check BP  at home  Wt Readings from Last 3 Encounters:  08/21/23 225 lb (102.1 kg)  05/31/23 220 lb (99.8 kg)  03/07/23 242 lb 6.4 oz (110 kg)   BP Readings from Last 3 Encounters:  08/21/23 136/73  06/28/23 132/79  05/31/23 (!) 152/79   Pulse Readings from Last 3 Encounters:  08/21/23 67  06/28/23 60  05/31/23 80    Renal function: Estimated Creatinine Clearance: 79.6 mL/min (by C-G formula based on SCr of 1.08 mg/dL).  Past Medical History:  Diagnosis Date   Allergy    BACK PAIN    ELEVATED BP READING WITHOUT DX HYPERTENSION 05/03/2008   Qualifier: Diagnosis of  By: Neomi Banks MD, Anitra Ket.    GERD (gastroesophageal reflux disease)    H/O cardiovascular stress test 2005    (-)   Hyperlipidemia    INSOMNIA-SLEEP DISORDER-UNSPEC    PVC (premature ventricular contraction)     Current Outpatient Medications on File Prior to Visit  Medication Sig Dispense Refill   amLODipine  (NORVASC ) 5 MG tablet Take 1 tablet (5 mg total) by mouth daily. 180 tablet 3   Ascorbic Acid (VITAMIN C) 1000 MG tablet Take 1,000 mg by  mouth every evening.     B Complex Vitamins (VITAMIN B-COMPLEX) TABS Take 1 tablet by mouth daily.     carvedilol  (COREG ) 6.25 MG tablet Take 1 tablet (6.25 mg total) by mouth 2 (two) times daily. 180 tablet 3   Cholecalciferol (VITAMIN D ) 125 MCG (5000 UT) CAPS Take 1 capsule by mouth in the morning.     clopidogrel  (PLAVIX ) 75 MG tablet TAKE 1 TABLET BY MOUTH EVERY DAY 90 tablet 3   Cyanocobalamin  (VITAMIN B 12 PO) Take 1 tablet by mouth every evening.     ezetimibe  (ZETIA ) 10 MG tablet TAKE 1 TABLET BY MOUTH EVERY DAY 90 tablet 2   Multiple Vitamin (MULTIVITAMIN) tablet Take 1 tablet by mouth every evening.     nitroGLYCERIN  (NITROSTAT ) 0.4 MG SL tablet Place 1 tablet (0.4 mg total) under the tongue every 5 (five) minutes x 3 doses as needed for chest pain. 25 tablet 3   pantoprazole  (PROTONIX ) 40 MG tablet TAKE 1 TABLET BY MOUTH EVERY DAY 90 tablet 2   Probiotic Product (PROBIOTIC  DAILY PO) Take by mouth.     rosuvastatin  (CRESTOR ) 40 MG tablet Take 1 tablet (40 mg total) by mouth at bedtime. 90 tablet 2   No current facility-administered medications on file prior to visit.    No Known Allergies  Blood pressure 136/73, pulse 67, height 5\' 11"  (1.803 m), weight 225 lb (102.1 kg).   Assessment/Plan:  1. Hypertension -  Essential (primary) hypertension Assessment: BP is controlled in office BP 133/73 mmHg heart rate 67 (goal <130/80) Tolerates carvedilol  6.25 mg twice daily, Irbesartan  150 mg daily, and amlodipine  5 mg daily  well without any side effects Denies SOB, palpitation, chest pain, headaches,or swelling Exercise regularly, watches salt intake    Plan:  Increase irbesartan  dose from 150 mg to 300 mg daily  Continue  taking carvedilol  6.25 mg twice daily, amlodipine  5 mg daily  Patient to keep record of BP readings with heart rate and report to us  at the next visit Patient to see PharmD in 6-8 weeks for follow up  Follow up lab(s) : BMP in 3-4 weeks     Thank you  Nickola Baron, Pharm.D [facility] A Division of [facility] [address], [place], [place] [address]  Phone: [phone]; Fax: [phone]

## 2023-09-01 ENCOUNTER — Ambulatory Visit
Admission: EM | Admit: 2023-09-01 | Discharge: 2023-09-01 | Disposition: A | Discharge status: Home / Self Care | Attending: Urgent Care | Admitting: Urgent Care

## 2023-09-01 ENCOUNTER — Ambulatory Visit
Admission: RE | Admit: 2023-09-01 | Discharge: 2023-09-01 | Disposition: A | Discharge status: Home / Self Care | Source: Ambulatory Visit | Attending: Family Medicine | Admitting: Family Medicine

## 2023-09-01 VITALS — BP 126/78 | HR 69 | Temp 98.0°F | Resp 16 | Ht 71.0 in | Wt 225.0 lb

## 2023-09-01 DIAGNOSIS — K921 Melena: Secondary | ICD-10-CM

## 2023-09-01 DIAGNOSIS — K529 Noninfective gastroenteritis and colitis, unspecified: Secondary | ICD-10-CM | POA: Diagnosis not present

## 2023-09-01 LAB — POCT URINALYSIS DIP (MANUAL ENTRY)
Bilirubin, UA: NEGATIVE
Blood, UA: NEGATIVE
Glucose, UA: NEGATIVE mg/dL
Ketones, POC UA: NEGATIVE mg/dL
Leukocytes, UA: NEGATIVE
Nitrite, UA: NEGATIVE
Protein Ur, POC: NEGATIVE mg/dL
Spec Grav, UA: 1.01
Urobilinogen, UA: 0.2 U/dL
pH, UA: 5.5

## 2023-09-01 LAB — C DIFFICILE QUICK SCREEN W PCR REFLEX
C Diff antigen: NEGATIVE
C Diff interpretation: NOT DETECTED
C Diff toxin: NEGATIVE

## 2023-09-01 MED ORDER — DICYCLOMINE HCL 20 MG PO TABS
20.0000 mg | ORAL_TABLET | Freq: Two times a day (BID) | ORAL | 0 refills | Status: DC
Start: 2023-09-01 — End: 2023-10-31

## 2023-09-01 MED ORDER — ONDANSETRON 4 MG PO TBDP: 4.0000 mg | ORAL_TABLET | Freq: Three times a day (TID) | ORAL | 0 refills | Status: DC | PRN

## 2023-09-01 NOTE — ED Triage Notes (Signed)
 Patient c/o diarrhea x 1 day, denies any nausea or vomiting.  Patient did notice blood in his stool this morning.  Has taken Immodium yesterday and today which eases the sx's but diarrhea has returned.  Patient doesn't remember eating anything different from family and he's the only one w/sx's.

## 2023-09-01 NOTE — ED Provider Notes (Signed)
 [facility]  Note:  This document was prepared using Conservation officer, historic buildings and may include unintentional dictation errors.  MRN: [id number] DOB: January 11, 1956  Subjective:   Shane Wood is a 68 y.o. male presenting for 2-day history of persistent bouts of diarrhea.  He started to notice blood in the stool this morning and was very concerning to the patient.  No fever, recent antibiotic use, hospitalizations or long distance travel.  Has not eaten raw foods, drank unfiltered water .  No history of GI disorders including Crohn's, IBS, ulcerative colitis.  No other sick contacts.  No current facility-administered medications for this encounter.  Current Outpatient Medications:    amLODipine  (NORVASC ) 5 MG tablet, Take 1 tablet (5 mg total) by mouth daily., Disp: 180 tablet, Rfl: 3   Ascorbic Acid (VITAMIN C) 1000 MG tablet, Take 1,000 mg by mouth every evening., Disp: , Rfl:    B Complex Vitamins (VITAMIN B-COMPLEX) TABS, Take 1 tablet by mouth daily., Disp: , Rfl:    carvedilol  (COREG ) 6.25 MG tablet, Take 1 tablet (6.25 mg total) by mouth 2 (two) times daily., Disp: 180 tablet, Rfl: 3   Cholecalciferol (VITAMIN D ) 125 MCG (5000 UT) CAPS, Take 1 capsule by mouth in the morning., Disp: , Rfl:    clopidogrel  (PLAVIX ) 75 MG tablet, TAKE 1 TABLET BY MOUTH EVERY DAY, Disp: 90 tablet, Rfl: 3   Cyanocobalamin  (VITAMIN B 12 PO), Take 1 tablet by mouth every evening., Disp: , Rfl:    ezetimibe  (ZETIA ) 10 MG tablet, TAKE 1 TABLET BY MOUTH EVERY DAY, Disp: 90 tablet, Rfl: 2   irbesartan  (AVAPRO ) 300 MG tablet, Take 1 tablet (300 mg total) by mouth daily., Disp: 90 tablet, Rfl: 3   Multiple Vitamin (MULTIVITAMIN) tablet, Take 1 tablet by mouth every evening., Disp: , Rfl:    nitroGLYCERIN  (NITROSTAT ) 0.4 MG SL tablet, Place 1 tablet (0.4 mg total) under the tongue every 5 (five) minutes x 3 doses as needed for chest pain., Disp: 25 tablet, Rfl: 3   pantoprazole   (PROTONIX ) 40 MG tablet, TAKE 1 TABLET BY MOUTH EVERY DAY, Disp: 90 tablet, Rfl: 2   Probiotic Product (PROBIOTIC DAILY PO), Take by mouth., Disp: , Rfl:    rosuvastatin  (CRESTOR ) 40 MG tablet, Take 1 tablet (40 mg total) by mouth at bedtime., Disp: 90 tablet, Rfl: 2   No Known Allergies  Past Medical History:  Diagnosis Date   Allergy    BACK PAIN    ELEVATED BP READING WITHOUT DX HYPERTENSION 05/03/2008   Qualifier: Diagnosis of  By: Neomi Banks MD, Anitra Ket.    GERD (gastroesophageal reflux disease)    H/O cardiovascular stress test 2005    (-)   Hyperlipidemia    INSOMNIA-SLEEP DISORDER-UNSPEC    PVC (premature ventricular contraction)      Past Surgical History:  Procedure Laterality Date   ARTERY REPAIR Right 08/23/2017   Procedure: EXPLORATION RIGHT RADIAL ARTERY,  RIGHT FOREARM FASCIOTOMY;  Surgeon: Richrd Char, MD;  Location: [facility] OR;  Service: Vascular;  Laterality: Right;   BUBBLE STUDY  03/24/2021   Procedure: BUBBLE STUDY;  Surgeon: Jacqueline Matsu, MD;  Location: [facility] ENDOSCOPY;  Service: Cardiovascular;;   CHOLECYSTECTOMY     CORONARY STENT INTERVENTION N/A 08/23/2017   Procedure: CORONARY STENT INTERVENTION;  Surgeon: Lucendia Rusk, MD;  Location: [facility] INVASIVE CV LAB;  Service: Cardiovascular;  Laterality: N/A;   LEFT HEART CATH AND CORONARY ANGIOGRAPHY N/A 08/23/2017   Procedure: LEFT  HEART CATH AND CORONARY ANGIOGRAPHY;  Surgeon: Lucendia Rusk, MD;  Location: [facility] INVASIVE CV LAB;  Service: Cardiovascular;  Laterality: N/A;   POLYPECTOMY     TEE WITHOUT CARDIOVERSION N/A 03/24/2021   Procedure: TRANSESOPHAGEAL ECHOCARDIOGRAM (TEE);  Surgeon: Jacqueline Matsu, MD;  Location: [facility] ENDOSCOPY;  Service: Cardiovascular;  Laterality: N/A;   TONSILLECTOMY     age 44 y/o    Family History  Problem Relation Age of Onset   Lung cancer Father        smoker   Diabetes Maternal Grandmother    Diabetes Maternal Grandfather    Hypertension Mother    Depression Mother    Obesity  Mother    Heart attack Other        uncle MI at age 72s   Stroke Other        GM, in her 27   Colon cancer Neg Hx    Prostate cancer Neg Hx    Colon polyps Neg Hx    Esophageal cancer Neg Hx    Rectal cancer Neg Hx    Stomach cancer Neg Hx    Pancreatic cancer Neg Hx     Social History   Tobacco Use   Smoking status: Former   Smokeless tobacco: Former    Types: Chew    Quit date: 1970   Tobacco comments:    1 ppd , quit 1989  Vaping Use   Vaping status: Never Used  Substance Use Topics   Alcohol use: Yes    Alcohol/week: 4.0 standard drinks of alcohol    Types: 4 Glasses of wine per week    Comment: occasionally on weekends   Drug use: No    ROS   Objective:   Vitals: BP 126/78 (BP Location: Right Arm)   Pulse 69   Temp 98 F (36.7 C) (Oral)   Resp 16   Ht 5\' 11"  (1.803 m)   Wt 225 lb (102.1 kg)   SpO2 98%   BMI 31.38 kg/m   Physical Exam Constitutional:      General: He is not in acute distress.    Appearance: Normal appearance. He is well-developed and normal weight. He is not ill-appearing, toxic-appearing or diaphoretic.  HENT:     Head: Normocephalic and atraumatic.     Right Ear: External ear normal.     Left Ear: External ear normal.     Nose: Nose normal.     Mouth/Throat:     Pharynx: Oropharynx is clear.  Eyes:     General: No scleral icterus.       Right eye: No discharge.        Left eye: No discharge.     Extraocular Movements: Extraocular movements intact.  Cardiovascular:     Rate and Rhythm: Normal rate.  Pulmonary:     Effort: Pulmonary effort is normal.  Abdominal:     General: Bowel sounds are increased. There is no distension.     Palpations: Abdomen is soft. There is no mass.     Tenderness: There is no abdominal tenderness. There is no right CVA tenderness, left CVA tenderness, guarding or rebound.  Musculoskeletal:     Cervical back: Normal range of motion.  Neurological:     Mental Status: He is alert and oriented to  person, place, and time.  Psychiatric:        Mood and Affect: Mood normal.        Behavior: Behavior normal.  Thought Content: Thought content normal.        Judgment: Judgment normal.     Results for orders placed or performed during the hospital encounter of 09/01/23 (from the past 24 hours)  POCT urinalysis dipstick     Status: Normal   Collection Time: 09/01/23  1:37 PM  Result Value Ref Range   Color, UA yellow    Clarity, UA clear    Glucose, UA negative mg/dL   Bilirubin, UA negative    Ketones, POC UA negative mg/dL   Spec Grav, UA 1.610    Blood, UA negative    pH, UA 5.5    Protein Ur, POC negative mg/dL   Urobilinogen, UA 0.2 E.U./dL   Nitrite, UA Negative    Leukocytes, UA Negative     Assessment and Plan :   PDMP not reviewed this encounter.  1. Acute hemorrhagic colitis   2. Bloody stools    GI panel and C. difficile testing pending.  Will manage for suspected colitis with supportive care.  Recommended patient hydrate well, eat light meals and maintain electrolytes.  Will use Zofran  and Bentyl for nausea, vomiting and diarrhea. Counseled patient on potential for adverse effects with medications prescribed/recommended today, ER and return-to-clinic precautions discussed, patient verbalized understanding.    Adolph Hoop, [place] 09/01/23 [id number]

## 2023-09-01 NOTE — ED Notes (Signed)
 Pt given stool collection kit and advised to return sample to clinic

## 2023-09-01 NOTE — ED Triage Notes (Addendum)
 Patient returned with stool specimen.  Shane Wood will place orders again.

## 2023-09-01 NOTE — Discharge Instructions (Addendum)
 Please return to clinic tomorrow to return the stool sample for testing.  Make sure you push fluids drinking mostly water  but mix it with Gatorade.  Try to eat light meals including soups, broths and soft foods, fruits.  You may use Zofran  for your nausea and vomiting once every 8 hours.  Bentyl can help with diarrhea but use this carefully limiting it to 1-2 times per day only if you are having a lot of diarrhea.  Please return to the clinic if symptoms worsen or you start having severe abdominal pain not helped by taking Tylenol  or start having bloody stools or blood in the vomit.

## 2023-09-01 NOTE — ED Notes (Signed)
 Pt states unable to give stool sample at this time

## 2023-09-02 ENCOUNTER — Encounter: Payer: Self-pay | Admitting: Cardiology

## 2023-09-02 ENCOUNTER — Encounter: Payer: Self-pay | Admitting: Internal Medicine

## 2023-09-02 ENCOUNTER — Ambulatory Visit: Attending: Cardiology | Admitting: Cardiology

## 2023-09-02 VITALS — BP 138/68 | HR 80 | Resp 16 | Ht 71.0 in | Wt 231.2 lb

## 2023-09-02 DIAGNOSIS — I10 Essential (primary) hypertension: Secondary | ICD-10-CM

## 2023-09-02 DIAGNOSIS — I2510 Atherosclerotic heart disease of native coronary artery without angina pectoris: Secondary | ICD-10-CM

## 2023-09-02 DIAGNOSIS — K529 Noninfective gastroenteritis and colitis, unspecified: Secondary | ICD-10-CM

## 2023-09-02 LAB — GASTROINTESTINAL PANEL BY PCR, STOOL (REPLACES STOOL CULTURE)

## 2023-09-02 MED ORDER — AMLODIPINE BESYLATE 10 MG PO TABS: 10.0000 mg | ORAL_TABLET | Freq: Every day | ORAL | Status: DC

## 2023-09-02 NOTE — Progress Notes (Signed)
 Cardiology Office Note:  .   Date:  09/02/2023  ID:  Shane Wood, DOB 1955-10-03, MRN [id number] PCP: Shane Hollow, MD  [facility] Providers Cardiologist:  Shane Ivy, MD PCP: Shane Hollow, MD  Chief Complaint  Patient presents with   Coronary artery disease involving native coronary artery of   Follow-up      History of Present Illness: .    Shane Wood is a 68 y.o. male with hypertension, hyperlipidemia, CAD  Patient was recently seen in urgent care with complaints of bloody diarrhea last 3 days.  Patient wonders if this is related to increase his dose of irbesartan  from 150 mg daily to 300 mg daily, that he started taking all May 7.  He denies any other complaints, other than increased frequency, mild abdominal cramping, and bloody diarrhea.  He has never had this before.  C. difficile was excluded during urgent care visit.  Of note, patient is currently taking Plavix , which she has been on as monotherapy since PCI >5 years ago.  Vitals:   09/02/23 1455  BP: 138/68  Pulse: 80  Resp: 16  SpO2: 96%      ROS:  Review of Systems  Cardiovascular:  Negative for chest pain, dyspnea on exertion, leg swelling, palpitations and syncope.  Gastrointestinal:  Positive for diarrhea.     Studies Reviewed: Shane Wood       EKG 03/07/2023: Normal sinus rhythm Possible Left atrial enlargement Nonspecific ST and T wave abnormality When compared with ECG of 11-Mar-2021 10:41, No significant change was found  Zio patch monitor 11 days 04/30/2023 - 05/11/2023: Dominant rhythm: Sinus. HR 48-144 bpm. Avg HR 74 bpm, at sinus rhythm. 1 episode of atrial tachycardia, at 194 bpm for 5 beats. <1% isolated SVE, couplet/triplets. 0 episodes of VT. <1% isolated VE, no couplet/triplets. No atrial fibrillation/atrial flutter/VT/high grade AV block, sinus pause >3sec noted. 3 patient triggered event, correlated with VE.     Independently interpreted 07/2023: Cr 1.0   Labs  12/2022: LDL 54   Independently interpreted Coronary angiogram 08/2017: Occluded proximal RCA, treated with overlapping stents, as well as stent to RPDA severe stenosis Moderate proximal LAD and left circumflex disease with occluded OM branches    Physical Exam:   Physical Exam Vitals and nursing note reviewed.  Constitutional:      General: He is not in acute distress. Neck:     Vascular: No JVD.  Cardiovascular:     Rate and Rhythm: Normal rate and regular rhythm.     Heart sounds: Normal heart sounds. No murmur heard. Pulmonary:     Effort: Pulmonary effort is normal.     Breath sounds: Normal breath sounds. No wheezing or rales.  Musculoskeletal:     Right lower leg: No edema.     Left lower leg: No edema.      VISIT DIAGNOSES:   ICD-10-CM   1. Acute hemorrhagic colitis  K52.9 CBC    Basic metabolic panel with GFR    Ambulatory referral to Gastroenterology    2. Essential (primary) hypertension  I10     3. Coronary artery disease involving native coronary artery of native heart without angina pectoris  I25.10         ASSESSMENT AND PLAN: .    Shane Wood is a 68 y.o. male with hypertension, hyperlipidemia, CAD, with acute bloody diarrhea  Acute bloody diarrhea: Possibly acute hemorrhagic colitis.  While irbesartan  can cause diarrhea and 5 to 6%  in patients, I would not expect this to be bloody diarrhea.  I do suspect there is a separate gastrointestinal etiology for his symptoms.  Nevertheless, have discontinued irbesartan  as well as Plavix .  Placed stat GI consult.  Check CBC and BMP today.  If GI etiology ruled out, he can be placed on aspirin  81 mg daily in future.  Hypertension: Fairly well-controlled on irbesartan  300 mg daily, Coreg  6.25 mg twice daily, amlodipine  5 mg daily. As I am stopping his irbesartan  today, I have increased his amlodipine  to 10 mg daily.  If blood pressure stays elevated after this change, Coreg  can be increased to 12.5 mg  twice daily. Keep follow-up with Pharm.D. on 10/14/2023.  CAD: Stop Plavix  given acute bloody diarrhea. In future, if GI etiology is similar, I would recommend monotherapy with aspirin  81 mg daily. Lipids very well-controlled.  F/u in 4-6 weeks w/pharmD< 3 months w/me  Signed, Shane Das, MD

## 2023-09-02 NOTE — Telephone Encounter (Signed)
 Spoke to patient and appointment moved to today.

## 2023-09-02 NOTE — Telephone Encounter (Signed)
 Very unlikely that irbesartan  is causing these symptoms. Happy to see him today or tomorrow. I am not in the office Wednesday onwards.  Thanks MJP

## 2023-09-02 NOTE — Patient Instructions (Signed)
 Medication Instructions:  INCREASE Amlodipine  to 10 mg   STOP Plavix   STOP Irbesartan    *If you need a refill on your cardiac medications before your next appointment, please call your pharmacy*  Lab Work: CBC BMP  If you have labs (blood work) drawn today and your tests are completely normal, you will receive your results only by: MyChart Message (if you have MyChart) OR A paper copy in the mail If you have any lab test that is abnormal or we need to change your treatment, we will call you to review the results.  REFERRAL TO GI    Follow-Up: At [facility], you and your health needs are our priority.  As part of our continuing mission to provide you with exceptional heart care, our providers are all part of one team.  This team includes your primary Cardiologist (physician) and Advanced Practice Providers or APPs (Physician Assistants and Nurse Practitioners) who all work together to provide you with the care you need, when you need it.  Your next appointment:   3 month(s)  Provider:   One of our Advanced Practice Providers (APPs): Melita Springer, PA-C  Friddie Jetty, NP Evaline Hill, NP  Theotis Flake, PA-C Lawana Pray, NP  Willis Harter, PA-C Lovette Rud, PA-C  [place], [place] Charles Connor, NP  Marlana Silvan, NP Marcie Sever, PA-C  Laquita Plant, PA-C    Dayna Dunn, PA-C  Marlyse Single, PA-C Palmer Bobo, NP Katlyn West, NP Callie Goodrich, PA-C  Evan Williams, PA-C Sheng Haley, PA-C  Xika Zhao, NP Kathleen Johnson, PA-C

## 2023-09-03 ENCOUNTER — Ambulatory Visit: Payer: Medicare PPO | Admitting: Cardiology

## 2023-09-03 ENCOUNTER — Ambulatory Visit: Admitting: Cardiology

## 2023-09-03 NOTE — Telephone Encounter (Signed)
 Spoke with the patient today. He had blood in the stools, some diarrhea and abdominal cramps. Did not have fever. At this point symptoms are subsiding. Today he had a bowel movement with semisolid stools and no blood. CBC from yesterday showed normal white count and no anemia. Transient colitis? We agreed on the following: Reach out if symptoms are not gradually going away. Seek attention if severe symptoms such as fever, more blood in the stools, severe abdominal pain. He feels comfortable with this plan.

## 2023-09-04 ENCOUNTER — Ambulatory Visit: Payer: Self-pay

## 2023-09-04 LAB — BASIC METABOLIC PANEL WITH GFR
BUN/Creatinine Ratio: 15 (ref 10–24)
BUN: 18 mg/dL (ref 8–27)
CO2: 21 mmol/L (ref 20–29)
Calcium: 9.4 mg/dL (ref 8.6–10.2)
Chloride: 104 mmol/L (ref 96–106)
Creatinine, Ser: 1.17 mg/dL (ref 0.76–1.27)
Glucose: 65 mg/dL — ABNORMAL LOW (ref 70–99)
Potassium: 4.4 mmol/L (ref 3.5–5.2)
Sodium: 142 mmol/L (ref 134–144)
eGFR: 68 mL/min/{1.73_m2} (ref >59)

## 2023-09-04 LAB — CBC
Hematocrit: 41.6 % (ref 37.5–51.0)
Hemoglobin: 13.4 g/dL (ref 13.0–17.7)
MCH: 29.1 pg (ref 26.6–33.0)
MCHC: 32.2 g/dL (ref 31.5–35.7)
MCV: 90 fL (ref 79–97)
Platelets: 233 10*3/uL (ref 150–450)
RBC: 4.6 x10E6/uL (ref 4.14–5.80)
RDW: 14 % (ref 11.6–15.4)
WBC: 7.5 10*3/uL (ref 3.4–10.8)

## 2023-09-19 ENCOUNTER — Other Ambulatory Visit: Payer: Self-pay

## 2023-09-19 ENCOUNTER — Encounter: Payer: Self-pay | Admitting: Cardiology

## 2023-09-19 MED ORDER — AMLODIPINE BESYLATE 10 MG PO TABS: 10.0000 mg | ORAL_TABLET | Freq: Every day | ORAL | 3 refills | Status: DC

## 2023-09-19 NOTE — Progress Notes (Signed)
 Amlodipine  10 mg once daily sent to preferred pharmacy.

## 2023-09-20 NOTE — Telephone Encounter (Signed)
 fyi

## 2023-09-20 NOTE — Telephone Encounter (Signed)
 Agree with above recommendations.  Occasional use is okay, but definitely avoid regular use.  Thanks MJP

## 2023-10-06 ENCOUNTER — Other Ambulatory Visit: Payer: Self-pay | Admitting: Interventional Cardiology

## 2023-10-06 ENCOUNTER — Encounter: Payer: Self-pay | Admitting: Cardiology

## 2023-10-07 ENCOUNTER — Ambulatory Visit: Admitting: Cardiology

## 2023-10-07 MED ORDER — AMLODIPINE BESYLATE 5 MG PO TABS
7.5000 mg | ORAL_TABLET | Freq: Every day | ORAL | 3 refills | Status: DC
Start: 2023-10-07 — End: 2023-10-14

## 2023-10-07 NOTE — Telephone Encounter (Signed)
 Recommend reducing amlodipine  to 7.5 mg daily, there is 1.5 tablet of 5 mg daily, and instead, increase carvedilol  to 25 mg twice daily.  Please keep us  posted how this works.  Thanks MJP

## 2023-10-07 NOTE — Telephone Encounter (Signed)
 Please review and advise.

## 2023-10-08 ENCOUNTER — Ambulatory Visit (INDEPENDENT_AMBULATORY_CARE_PROVIDER_SITE_OTHER): Admitting: *Deleted

## 2023-10-08 VITALS — Ht 71.0 in | Wt 239.0 lb

## 2023-10-08 DIAGNOSIS — Z0000 Encounter for general adult medical examination without abnormal findings: Secondary | ICD-10-CM

## 2023-10-08 MED ORDER — CARVEDILOL 25 MG PO TABS: 25.0000 mg | ORAL_TABLET | Freq: Two times a day (BID) | ORAL | 3 refills | Status: AC

## 2023-10-08 NOTE — Progress Notes (Signed)
 Subjective:   Shane Wood is a 68 y.o. who presents for a Medicare Wellness preventive visit.  As a reminder, Annual Wellness Visits don't include a physical exam, and some assessments may be limited, especially if this visit is performed virtually. We may recommend an in-person follow-up visit with your provider if needed.  Visit Complete: Virtual I connected with  Shane Wood on 10/08/23 by a audio enabled telemedicine application and verified that I am speaking with the correct person using two identifiers.  Patient Location: Home  Provider Location: Office/Clinic  I discussed the limitations of evaluation and management by telemedicine. The patient expressed understanding and agreed to proceed.  Vital Signs: Because this visit was a virtual/telehealth visit, some criteria may be missing or patient reported. Any vitals not documented were not able to be obtained and vitals that have been documented are patient reported.  VideoDeclined- This patient declined Librarian, academic. Therefore the visit was completed with audio only.  Persons Participating in Visit: Patient.  AWV Questionnaire: Yes: Patient Medicare AWV questionnaire was completed by the patient on 10/01/23; I have confirmed that all information answered by patient is correct and no changes since this date.  Cardiac Risk Factors include: advanced age (>27men, >61 women);dyslipidemia;hypertension;obesity (BMI >30kg/m2);Other (see comment), Risk factor comments: PVC, CAD    Objective:    Today's Vitals   10/08/23 1459  Weight: 239 lb (108.4 kg)  Height: 5' 11 (1.803 m)   Body mass index is 33.33 kg/m.     09/27/2022    8:27 AM 09/25/2021    9:19 AM 03/24/2021    8:17 AM 03/10/2021   12:41 PM 08/23/2017    6:30 AM 08/22/2017   10:38 PM 12/05/2016    8:17 AM  Advanced Directives  Does Patient Have a Medical Advance Directive? Yes Yes No No  No  No   Type of Special educational needs teacher of [place];Living will Healthcare Power of Attorney       Copy of Healthcare Power of Attorney in Chart? No - copy requested No - copy requested       Would patient like information on creating a medical advance directive?    No - Patient declined No - Patient declined        Data saved with a previous flowsheet row definition    Current Medications (verified) Outpatient Encounter Medications as of 10/08/2023  Medication Sig   amLODipine  (NORVASC ) 5 MG tablet Take 1.5 tablets (7.5 mg total) by mouth daily.   Ascorbic Acid (VITAMIN C) 1000 MG tablet Take 1,000 mg by mouth every evening.   B Complex Vitamins (VITAMIN B-COMPLEX) TABS Take 1 tablet by mouth daily.   carvedilol  (COREG ) 25 MG tablet Take 1 tablet (25 mg total) by mouth 2 (two) times daily.   Cholecalciferol (VITAMIN D ) 125 MCG (5000 UT) CAPS Take 1 capsule by mouth in the morning.   Cyanocobalamin  (VITAMIN B 12 PO) Take 1 tablet by mouth every evening.   dicyclomine  (BENTYL ) 20 MG tablet Take 1 tablet (20 mg total) by mouth 2 (two) times daily.   ezetimibe  (ZETIA ) 10 MG tablet TAKE 1 TABLET BY MOUTH EVERY DAY   Multiple Vitamin (MULTIVITAMIN) tablet Take 1 tablet by mouth every evening.   nitroGLYCERIN  (NITROSTAT ) 0.4 MG SL tablet Place 1 tablet (0.4 mg total) under the tongue every 5 (five) minutes x 3 doses as needed for chest pain.   ondansetron  (ZOFRAN -ODT) 4 MG disintegrating tablet Take 1  tablet (4 mg total) by mouth every 8 (eight) hours as needed for nausea or vomiting.   pantoprazole  (PROTONIX ) 40 MG tablet TAKE 1 TABLET BY MOUTH EVERY DAY   Probiotic Product (PROBIOTIC DAILY PO) Take by mouth.   rosuvastatin  (CRESTOR ) 40 MG tablet Take 1 tablet (40 mg total) by mouth at bedtime.   No facility-administered encounter medications on file as of 10/08/2023.    Allergies (verified) Patient has no known allergies.   History: Past Medical History:  Diagnosis Date   Allergy    BACK PAIN    ELEVATED BP  READING WITHOUT DX HYPERTENSION 05/03/2008   Qualifier: Diagnosis of  By: Neomi Banks MD, Anitra Ket.    GERD (gastroesophageal reflux disease)    H/O cardiovascular stress test 2005    (-)   Hyperlipidemia    INSOMNIA-SLEEP DISORDER-UNSPEC    PVC (premature ventricular contraction)    Past Surgical History:  Procedure Laterality Date   ARTERY REPAIR Right 08/23/2017   Procedure: EXPLORATION RIGHT RADIAL ARTERY,  RIGHT FOREARM FASCIOTOMY;  Surgeon: Richrd Char, MD;  Location: [facility] OR;  Service: Vascular;  Laterality: Right;   BUBBLE STUDY  03/24/2021   Procedure: BUBBLE STUDY;  Surgeon: Jacqueline Matsu, MD;  Location: [facility] ENDOSCOPY;  Service: Cardiovascular;;   CHOLECYSTECTOMY     CORONARY STENT INTERVENTION N/A 08/23/2017   Procedure: CORONARY STENT INTERVENTION;  Surgeon: Lucendia Rusk, MD;  Location: [facility] INVASIVE CV LAB;  Service: Cardiovascular;  Laterality: N/A;   LEFT HEART CATH AND CORONARY ANGIOGRAPHY N/A 08/23/2017   Procedure: LEFT HEART CATH AND CORONARY ANGIOGRAPHY;  Surgeon: Lucendia Rusk, MD;  Location[facility] INVASIVE CV LAB;  Service: Cardiovascular;  Laterality: N/A;   POLYPECTOMY     TEE WITHOUT CARDIOVERSION N/A 03/24/2021   Procedure: TRANSESOPHAGEAL ECHOCARDIOGRAM (TEE);  Surgeon: Jacqueline Matsu, MD;  Location: [facility] ENDOSCOPY;  Service: Cardiovascular;  Laterality: N/A;   TONSILLECTOMY     age 34 y/o   Family History  Problem Relation Age of Onset   Lung cancer Father        smoker   Diabetes Maternal Grandmother    Diabetes Maternal Grandfather    Hypertension Mother    Depression Mother    Obesity Mother    Heart attack Other        uncle MI at age 43s   Stroke Other        GM, in her 69   Colon cancer Neg Hx    Prostate cancer Neg Hx    Colon polyps Neg Hx    Esophageal cancer Neg Hx    Rectal cancer Neg Hx    Stomach cancer Neg Hx    Pancreatic cancer Neg Hx    Social History   Socioeconomic History   Marital status: Married    Spouse name: [name]    Number of children: 2   Years of education: Not on file   Highest education level: Master's degree (e.g., MA, MS, MEng, MEd, MSW, MBA)  Occupational History   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  Tobacco Use   Smoking status: Former    Average packs/day: 1 pack/day for 14.0 years (14.0 ttl pk-yrs)    Types: Cigarettes    Start date: 41   Smokeless tobacco: Former    Types: Chew    Quit date: 1970   Tobacco comments:    1 ppd , quit 1989  Vaping Use   Vaping status: Never Used  Substance and Sexual Activity   Alcohol use: Yes    Alcohol/week: 4.0 standard drinks of alcohol    Types: 4 Glasses of wine per week    Comment: occasionally on weekends   Drug use: No   Sexual activity: Not on file  Other Topics Concern   Not on file  Social History Narrative   Household pt and wife    2 adult married children   Social Drivers of Corporate investment banker Strain: Low Risk  (10/01/2023)   Overall Financial Resource Strain (CARDIA)    Difficulty of Paying Living Expenses: Not hard at all  Food Insecurity: No Food Insecurity (10/01/2023)   Hunger Vital Sign    Worried About Running Out of Food in the Last Year: Never true    Ran Out of Food in the Last Year: Never true  Transportation Needs: No Transportation Needs (10/01/2023)   PRAPARE - Administrator, Civil Service (Medical): No    Lack of Transportation (Non-Medical): No  Physical Activity: Sufficiently Active (10/01/2023)   Exercise Vital Sign    Days of Exercise per Week: 7 days    Minutes of Exercise per Session: 60 min  Stress: No Stress Concern Present (10/01/2023)   [employer] of Occupational Health - Occupational Stress Questionnaire    Feeling of Stress : Only a little  Social Connections: Socially Integrated (10/01/2023)   Social Connection and Isolation Panel    Frequency of Communication with Friends and Family: More than three times a week    Frequency of Social  Gatherings with Friends and Family: More than three times a week    Attends Religious Services: More than 4 times per year    Active Member of [employer] or Organizations: Yes    Attends Engineer, structural: More than 4 times per year    Marital Status: Married    Tobacco Counseling Counseling given: Not Answered Tobacco comments: 1 ppd , quit 1989    Clinical Intake:  Pre-visit preparation completed: Yes  Pain : No/denies pain    BMI - recorded: 33.33 Nutritional Status: BMI > 30  Obese Diabetes: No  Lab Results  Component Value Date   HGBA1C 5.8 01/14/2023   HGBA1C 5.8 01/08/2022   HGBA1C 5.3 03/11/2021     How often do you need to have someone help you when you read instructions, pamphlets, or other written materials from your doctor or pharmacy?: 1 - Never What is the last grade level you completed in school?: Master's Degree  Interpreter Needed?: No  Information entered by :: Susa Engman, CMA   Activities of Daily Living     10/01/2023    9:24 AM  In your present state of health, do you have any difficulty performing the following activities:  Hearing? 0  Vision? 0  Difficulty concentrating or making decisions? 0  Walking or climbing stairs? 0  Dressing or bathing? 0  Doing errands, shopping? 0  Preparing Food and eating ? N  Using the Toilet? N  In the past six months, have you accidently leaked urine? N  Do you have problems with loss of bowel control? N  Managing your Medications? N  Managing your Finances? N  Housekeeping or managing your Housekeeping? N    Patient Care Team: Ezell Hollow, MD as PCP - General Patwardhan, Kaye Parsons, MD as PCP - Cardiology (Cardiology) Loyde Rule, MD as Consulting Physician (Cardiology) Daneil Dunker Lytle Saunders, DO as Consulting Physician (Optometry)  I have updated your Care Teams any recent Medical Services you may have received from other providers in the past year.     Assessment:   This is a routine  wellness examination for Mcclellan.  Hearing/Vision screen Hearing Screening - Comments:: Denies hearing difficulties.  Vision Screening - Comments:: Wears RX glasses -- up to date with routine eye exams. Last Exam December.   Goals Addressed   None    Depression Screen     10/08/2023    3:16 PM 01/14/2023    7:58 AM 09/27/2022    8:30 AM 01/08/2022    8:37 AM 09/25/2021    9:21 AM 04/11/2021   11:09 AM 12/13/2020    4:06 PM  PHQ 2/9 Scores  PHQ - 2 Score 0 0 0 0 0 0 0    Fall Risk     10/01/2023    9:24 AM 01/14/2023    7:58 AM 09/27/2022    8:28 AM 01/08/2022    8:37 AM 09/25/2021    9:20 AM  Fall Risk   Falls in the past year? 0 0 0 0 0  Number falls in past yr: 0 0 0 0 0  Injury with Fall? 0 0 0 0 0  Risk for fall due to : No Fall Risks  No Fall Risks    Follow up Education provided Falls evaluation completed Falls evaluation completed Falls evaluation completed  Falls prevention discussed      Data saved with a previous flowsheet row definition    MEDICARE RISK AT HOME:  Medicare Risk at Home Any stairs in or around the home?: (Patient-Rptd) Yes If so, are there any without handrails?: (Patient-Rptd) No Home free of loose throw rugs in walkways, pet beds, electrical cords, etc?: (Patient-Rptd) Yes Adequate lighting in your home to reduce risk of falls?: (Patient-Rptd) Yes Life alert?: (Patient-Rptd) No Use of a cane, walker or w/c?: (Patient-Rptd) No Grab bars in the bathroom?: (Patient-Rptd) No Shower chair or bench in shower?: (Patient-Rptd) No Elevated toilet seat or a handicapped toilet?: (Patient-Rptd) No  TIMED UP AND GO:  Was the test performed?  No, audio  Cognitive Function: 6CIT completed        10/08/2023    3:09 PM 09/27/2022    8:35 AM  6CIT Screen  What Year? 0 points 0 points  What month? 0 points 0 points  What time? 0 points 0 points  Count back from 20 0 points 0 points  Months in reverse 0 points 0 points  Repeat phrase 0 points 0 points   Total Score 0 points 0 points    Immunizations Immunization History  Administered Date(s) Administered   Fluad Quad(high Dose 65+) 01/05/2021, 12/31/2021   Influenza Split 12/28/2017   Influenza Whole 01/21/2010   Influenza, High Dose Seasonal PF 12/23/2022   Influenza,inj,Quad PF,6+ Mos 12/22/2018   Influenza-Unspecified 01/30/2015, 04/07/2016, 12/29/2016, 02/07/2020   Moderna Covid-19 Fall Seasonal Vaccine 17yrs & older 12/23/2022   Moderna Covid-19 Vaccine Bivalent Booster 79yrs & up 01/22/2022   Moderna Sars-Covid-2 Vaccination 10/16/2020   PFIZER(Purple Top)SARS-COV-2 Vaccination 07/03/2019, 07/29/2019, 02/21/2020   PNEUMOCOCCAL CONJUGATE-20 01/05/2021   Pfizer Covid-19 Vaccine Bivalent Booster 84yrs & up 02/21/2021   Pneumococcal Polysaccharide-23 12/22/2018   Respiratory Syncytial Virus Vaccine,Recomb Aduvanted(Arexvy) 01/22/2022   Td 07/06/2005   Tdap 07/08/2015   Zoster Recombinant(Shingrix) 01/01/2020, 03/04/2020   Zoster, Live 07/08/2015    Screening Tests Health Maintenance  Topic Date Due   COVID-19 Vaccine (8 - Pfizer risk 2024-25  season) 06/22/2023   INFLUENZA VACCINE  11/22/2023   Medicare Annual Wellness (AWV)  10/07/2024   DTaP/Tdap/Td (3 - Td or Tdap) 07/07/2025   Colonoscopy  12/20/2026   Pneumococcal Vaccine: 50+ Years  Completed   Hepatitis C Screening  Completed   Zoster Vaccines- Shingrix  Completed   HPV VACCINES  Aged Out   Meningococcal B Vaccine  Aged Out    Health Maintenance  Health Maintenance Due  Topic Date Due   COVID-19 Vaccine (8 - Pfizer risk 2024-25 season) 06/22/2023   Health Maintenance Items Addressed: All HM up to date.  Additional Screening:  Vision Screening: Recommended annual ophthalmology exams for early detection of glaucoma and other disorders of the eye. Would you like a referral to an eye doctor? No    Dental Screening: Recommended annual dental exams for proper oral hygiene  Community Resource Referral /  Chronic Care Management: CRR required this visit?  No   CCM required this visit?  No   Plan:    I have personally reviewed and noted the following in the patient's chart:   Medical and social history Use of alcohol, tobacco or illicit drugs  Current medications and supplements including opioid prescriptions. Patient is not currently taking opioid prescriptions. Functional ability and status Nutritional status Physical activity Advanced directives List of other physicians Hospitalizations, surgeries, and ER visits in previous 12 months Vitals Screenings to include cognitive, depression, and falls Referrals and appointments  In addition, I have reviewed and discussed with patient certain preventive protocols, quality metrics, and best practice recommendations. A written personalized care plan for preventive services as well as general preventive health recommendations were provided to patient.   Susa Engman, CMA   10/08/2023   After Visit Summary: (MyChart) Due to this being a telephonic visit, the after visit summary with patients personalized plan was offered to patient via MyChart   Notes: Nothing significant to report at this time.

## 2023-10-08 NOTE — Addendum Note (Signed)
 Addended by: Juda Norse on: 10/08/2023 02:12 PM   Modules accepted: Orders

## 2023-10-08 NOTE — Patient Instructions (Signed)
 Shane Wood , Thank you for taking time out of your busy schedule to complete your Annual Wellness Visit with me. I enjoyed our conversation and look forward to speaking with you again next year. I, as well as your care team,  appreciate your ongoing commitment to your health goals. Please review the following plan we discussed and let me know if I can assist you in the future. Your Game plan/ To Do List    Follow up Visits: Next Medicare AWV with our clinical staff: 10/09/23 3pm   Next Office Visit with your provider: 01/15/24 9am  Clinician Recommendations:  Aim for 30 minutes of exercise or brisk walking, 6-8 glasses of water , and 5 servings of fruits and vegetables each day.       This is a list of the screening recommended for you and due dates:  Health Maintenance  Topic Date Due   COVID-19 Vaccine (8 - Pfizer risk 2024-25 season) 06/22/2023   Medicare Annual Wellness Visit  09/27/2023   Flu Shot  11/22/2023   DTaP/Tdap/Td vaccine (3 - Td or Tdap) 07/07/2025   Colon Cancer Screening  12/20/2026   Pneumococcal Vaccine for age over 61  Completed   Hepatitis C Screening  Completed   Zoster (Shingles) Vaccine  Completed   HPV Vaccine  Aged Out   Meningitis B Vaccine  Aged Out    Advanced directives: (ACP Link)Information on Advanced Care Planning can be found at [facility] Advance Health Care Directives Advance Health Care Directives. [email or web address]  Advance Care Planning is important because it:  [x]  Makes sure you receive the medical care that is consistent with your values, goals, and preferences  [x]  It provides guidance to your family and loved ones and reduces their decisional burden about whether or not they are making the right decisions based on your wishes.  Follow the link provided in your after visit summary or read over the paperwork we have mailed to you to help you started getting your Advance Directives in place. If you need assistance in completing  these, please reach out to us  so that we can help you!  See attachments for Fall Prevention Tips.

## 2023-10-11 ENCOUNTER — Ambulatory Visit: Payer: Self-pay | Admitting: Gastroenterology

## 2023-10-11 ENCOUNTER — Other Ambulatory Visit: Payer: Self-pay

## 2023-10-11 ENCOUNTER — Other Ambulatory Visit

## 2023-10-11 ENCOUNTER — Encounter: Payer: Self-pay | Admitting: Gastroenterology

## 2023-10-11 ENCOUNTER — Ambulatory Visit: Admitting: Gastroenterology

## 2023-10-11 VITALS — BP 110/62 | HR 54 | Ht 71.0 in | Wt 237.2 lb

## 2023-10-11 DIAGNOSIS — R194 Change in bowel habit: Secondary | ICD-10-CM | POA: Diagnosis not present

## 2023-10-11 DIAGNOSIS — R109 Unspecified abdominal pain: Secondary | ICD-10-CM

## 2023-10-11 DIAGNOSIS — D649 Anemia, unspecified: Secondary | ICD-10-CM

## 2023-10-11 DIAGNOSIS — R140 Abdominal distension (gaseous): Secondary | ICD-10-CM | POA: Diagnosis not present

## 2023-10-11 DIAGNOSIS — R197 Diarrhea, unspecified: Secondary | ICD-10-CM | POA: Diagnosis not present

## 2023-10-11 DIAGNOSIS — Z860101 Personal history of adenomatous and serrated colon polyps: Secondary | ICD-10-CM

## 2023-10-11 LAB — CBC WITH DIFFERENTIAL/PLATELET
Basophils Absolute: 0 10*3/uL (ref 0.0–0.1)
Basophils Relative: 0.4 % (ref 0.0–3.0)
Eosinophils Absolute: 0.2 10*3/uL (ref 0.0–0.7)
Eosinophils Relative: 2.9 % (ref 0.0–5.0)
HCT: 36.5 % — ABNORMAL LOW (ref 39.0–52.0)
Hemoglobin: 12.3 g/dL — ABNORMAL LOW (ref 13.0–17.0)
Lymphocytes Relative: 26.2 % (ref 12.0–46.0)
Lymphs Abs: 1.5 10*3/uL (ref 0.7–4.0)
MCHC: 33.7 g/dL (ref 30.0–36.0)
MCV: 85.8 fl (ref 78.0–100.0)
Monocytes Absolute: 0.6 10*3/uL (ref 0.1–1.0)
Monocytes Relative: 9.8 % (ref 3.0–12.0)
Neutro Abs: 3.5 10*3/uL (ref 1.4–7.7)
Neutrophils Relative %: 60.7 % (ref 43.0–77.0)
Platelets: 233 10*3/uL (ref 150.0–400.0)
RBC: 4.26 Mil/uL (ref 4.22–5.81)
RDW: 14.2 % (ref 11.5–15.5)
WBC: 5.7 10*3/uL (ref 4.0–10.5)

## 2023-10-11 LAB — COMPREHENSIVE METABOLIC PANEL WITH GFR
ALT: 26 U/L (ref 0–53)
AST: 22 U/L (ref 0–37)
Albumin: 4.1 g/dL (ref 3.5–5.2)
Alkaline Phosphatase: 40 U/L (ref 39–117)
BUN: 23 mg/dL (ref 6–23)
CO2: 27 meq/L (ref 19–32)
Calcium: 8.8 mg/dL (ref 8.4–10.5)
Chloride: 107 meq/L (ref 96–112)
Creatinine, Ser: 1.07 mg/dL (ref 0.40–1.50)
GFR: 71.41 mL/min (ref >60.00)
Glucose, Bld: 105 mg/dL — ABNORMAL HIGH (ref 70–99)
Potassium: 4.5 meq/L (ref 3.5–5.1)
Sodium: 142 meq/L (ref 135–145)
Total Bilirubin: 0.4 mg/dL (ref 0.2–1.2)
Total Protein: 6.2 g/dL (ref 6.0–8.3)

## 2023-10-11 LAB — C-REACTIVE PROTEIN: CRP: <1 mg/dL (ref 0.5–20.0)

## 2023-10-11 LAB — SEDIMENTATION RATE: Sed Rate: 16 mm/h (ref 0–20)

## 2023-10-11 MED ORDER — NA SULFATE-K SULFATE-MG SULF 17.5-3.13-1.6 GM/177ML PO SOLN: 1.0000 | Freq: Once | ORAL | 0 refills | Status: AC

## 2023-10-11 NOTE — Patient Instructions (Signed)
 _______________________________________________________  If your blood pressure at your visit was 140/90 or greater, please contact your primary care physician to follow up on this.  _______________________________________________________  If you are age 68 or older, your body mass index should be between 23-30. Your Body mass index is 33.09 kg/m. If this is out of the aforementioned range listed, please consider follow up with your Primary Care Provider.  If you are age 75 or younger, your body mass index should be between 19-25. Your Body mass index is 33.09 kg/m. If this is out of the aformentioned range listed, please consider follow up with your Primary Care Provider.   ________________________________________________________  The [facility] providers would like to encourage you to use MYCHART to communicate with providers for non-urgent requests or questions.  Due to long hold times on the telephone, sending your provider a message by [facility] may be a faster and more efficient way to get a response.  Please allow 48 business hours for a response.  Please remember that this is for non-urgent requests.  _______________________________________________________  Your provider has requested that you go to the basement level for lab work before leaving today. Press B on the elevator. The lab is located at the first door on the left as you exit the elevator.  We have sent the following medications to your pharmacy for you to pick up at your convenience: Suprep  You have been scheduled for a colonoscopy. Please follow written instructions given to you at your visit today.   If you use inhalers (even only as needed), please bring them with you on the day of your procedure.  DO NOT TAKE 7 DAYS PRIOR TO TEST- Trulicity (dulaglutide) Ozempic, Wegovy (semaglutide ) Mounjaro (tirzepatide) Bydureon Bcise (exanatide extended release)  DO NOT TAKE 1 DAY PRIOR TO YOUR TEST Rybelsus   (semaglutide ) Adlyxin (lixisenatide) Victoza  (liraglutide ) Byetta (exanatide) ___________________________________________________________________________  It was a pleasure to see you today!  Thank you for trusting me with your gastrointestinal care!

## 2023-10-11 NOTE — Progress Notes (Signed)
 Shane Wood [id number] 06-21-1955   Chief Complaint: Bloody diarrhea, abdominal cramping  Referring Provider: Ezell Hollow, MD Primary GI MD: Para Bold (previous Dr. Howard Macho)  HPI: Shane Wood is a 68 y.o. male with past medical history of GERD, HTN, HLD, CAD, MI s/p coronary stenting 2019, cholecystectomy, adenomatous colon polyps 2012 who presents today for a complaint of bloody diarrhea.    09/01/2023 patient seen in urgent care for 2-day history of diarrhea with bloody stool.  No fever, recent antibiotic use, hospitalizations, travel.  GI pathogen panel and C. difficile testing were negative.  Patient managed with supportive care and prescribed Zofran  and Bentyl  for nausea, vomiting, diarrhea.  Labs 09/02/2023: Normal CBC, blood glucose 65, otherwise normal BMP.  09/02/2023 patient seen by cardiology.  Discussed possibility of acute hemorrhagic colitis.  Irbesartan  and Plavix  were both discontinued at this visit.  He was referred to GI for further workup with note that if GI etiology ruled out, patient can be placed on aspirin  81 mg daily in future.  Last colonoscopy 2018 was normal with 10-year recall recommended.   Patient states that last month he was having severe abdominal cramping and diarrhea which lasted 2 to 3 days.  He noticed blood in his stool during that time.  He had been on irbesartan  and had just had a dose increase prior to this diarrheal episode.  This has since been discontinued.  Otherwise, patient is unable to think of anything that may have caused this.  Did not have any other change in medication, antibiotic use, travel, sick contacts, or known contaminated food or water .  This was the first time he has ever seen blood in his stool.  He denies any associated nausea, vomiting, fever, chills.  He denies any family history of colon or stomach cancer.  Denies family history of IBD.  He denies NSAID use but takes Tylenol  occasionally.  He has been noticing  abdominal bloating for the last few months.  A while ago he was noticing some loose stools and started a probiotic which seems to have helped.  Occasionally has some constipation which is relieved by MiraLAX as needed.  Typically has a bowel movement once a day and aside from recent diarrheal episode denies seeing any blood on the toilet paper or in the toilet.  He denies any recent problems with hemorrhoids, states he had issues with this maybe about 25 years ago.  He has history of GERD which is controlled on PPI.  Denies dysphagia or breakthrough symptoms on medication.  He has history of MI May 2019.  Denies stroke history.  Denies oxygen use or problems with anesthesia.  Previous GI Procedures/Imaging   Colonoscopy 12/19/2016 - One 3 mm polyp in the sigmoid colon, removed with a cold snare. Resected and retrieved.  - The examination was otherwise normal on direct and retroflexion views. - Recall 10 years Path:  Surgical [P], sigmoid, polyp - BENIGN COLONIC MUCOSA. - NO ACTIVE INFLAMMATION OR EVIDENCE OF MICROSCOPIC COLITIS. - NO DYSPLASIA OR MALIGNANCY.  Colonoscopy 10/09/2010 - 3 polyps found, all removed and all sent to pathology - Otherwise normal examination Path: 2 adenomas  Past Medical History:  Diagnosis Date   Allergy    BACK PAIN    ELEVATED BP READING WITHOUT DX HYPERTENSION 05/03/2008   Qualifier: Diagnosis of  By: Neomi Banks MD, Anitra Ket.    GERD (gastroesophageal reflux disease)    H/O cardiovascular stress test 2005    (-)   Hyperlipidemia  INSOMNIA-SLEEP DISORDER-UNSPEC    PVC (premature ventricular contraction)     Past Surgical History:  Procedure Laterality Date   ARTERY REPAIR Right 08/23/2017   Procedure: EXPLORATION RIGHT RADIAL ARTERY,  RIGHT FOREARM FASCIOTOMY;  Surgeon: Richrd Char, MD;  Location: [facility] OR;  Service: Vascular;  Laterality: Right;   BUBBLE STUDY  03/24/2021   Procedure: BUBBLE STUDY;  Surgeon: Jacqueline Matsu, MD;  Location: [facility]  ENDOSCOPY;  Service: Cardiovascular;;   CHOLECYSTECTOMY     CORONARY STENT INTERVENTION N/A 08/23/2017   Procedure: CORONARY STENT INTERVENTION;  Surgeon: Lucendia Rusk, MD;  Location: [facility] INVASIVE CV LAB;  Service: Cardiovascular;  Laterality: N/A;   LEFT HEART CATH AND CORONARY ANGIOGRAPHY N/A 08/23/2017   Procedure: LEFT HEART CATH AND CORONARY ANGIOGRAPHY;  Surgeon: Lucendia Rusk, MD;  Location: [facility] INVASIVE CV LAB;  Service: Cardiovascular;  Laterality: N/A;   POLYPECTOMY     TEE WITHOUT CARDIOVERSION N/A 03/24/2021   Procedure: TRANSESOPHAGEAL ECHOCARDIOGRAM (TEE);  Surgeon: Jacqueline Matsu, MD;  Location: [facility] ENDOSCOPY;  Service: Cardiovascular;  Laterality: N/A;   TONSILLECTOMY     age 48 y/o    Current Outpatient Medications  Medication Sig Dispense Refill   amLODipine  (NORVASC ) 5 MG tablet Take 1.5 tablets (7.5 mg total) by mouth daily. 135 tablet 3   Ascorbic Acid (VITAMIN C) 1000 MG tablet Take 1,000 mg by mouth every evening.     B Complex Vitamins (VITAMIN B-COMPLEX) TABS Take 1 tablet by mouth daily.     carvedilol  (COREG ) 25 MG tablet Take 1 tablet (25 mg total) by mouth 2 (two) times daily. 180 tablet 3   Cholecalciferol (VITAMIN D ) 125 MCG (5000 UT) CAPS Take 1 capsule by mouth in the morning.     Cyanocobalamin  (VITAMIN B 12 PO) Take 1 tablet by mouth every evening.     dicyclomine  (BENTYL ) 20 MG tablet Take 1 tablet (20 mg total) by mouth 2 (two) times daily. 20 tablet 0   ezetimibe  (ZETIA ) 10 MG tablet TAKE 1 TABLET BY MOUTH EVERY DAY 90 tablet 2   Multiple Vitamin (MULTIVITAMIN) tablet Take 1 tablet by mouth every evening.     nitroGLYCERIN  (NITROSTAT ) 0.4 MG SL tablet Place 1 tablet (0.4 mg total) under the tongue every 5 (five) minutes x 3 doses as needed for chest pain. 25 tablet 3   ondansetron  (ZOFRAN -ODT) 4 MG disintegrating tablet Take 1 tablet (4 mg total) by mouth every 8 (eight) hours as needed for nausea or vomiting. 20 tablet 0   pantoprazole   (PROTONIX ) 40 MG tablet TAKE 1 TABLET BY MOUTH EVERY DAY 90 tablet 2   Probiotic Product (PROBIOTIC DAILY PO) Take by mouth.     rosuvastatin  (CRESTOR ) 40 MG tablet Take 1 tablet (40 mg total) by mouth at bedtime. 90 tablet 2   No current facility-administered medications for this visit.    Allergies as of 10/11/2023   (No Known Allergies)    Family History  Problem Relation Age of Onset   Lung cancer Father        smoker   Diabetes Maternal Grandmother    Diabetes Maternal Grandfather    Hypertension Mother    Depression Mother    Obesity Mother    Heart attack Other        uncle MI at age 37s   Stroke Other        GM, in her 26   Colon cancer Neg Hx    Prostate cancer Neg Hx  Colon polyps Neg Hx    Esophageal cancer Neg Hx    Rectal cancer Neg Hx    Stomach cancer Neg Hx    Pancreatic cancer Neg Hx     Social History   Tobacco Use   Smoking status: Former    Average packs/day: 1 pack/day for 14.0 years (14.0 ttl pk-yrs)    Types: Cigarettes    Start date: 66   Smokeless tobacco: Former    Types: Chew    Quit date: 1970   Tobacco comments:    1 ppd , quit 1989  Vaping Use   Vaping status: Never Used  Substance Use Topics   Alcohol use: Yes    Alcohol/week: 4.0 standard drinks of alcohol    Types: 4 Glasses of wine per week    Comment: occasionally on weekends   Drug use: No      Review of Systems:    Constitutional: No weight loss, fever, chills, weakness or fatigue Skin: No rash or itching Cardiovascular: No chest pain, chest pressure or palpitations   Respiratory: No SOB or cough Gastrointestinal: See HPI and otherwise negative Genitourinary: No dysuria or change in urinary frequency Neurological: No headache, dizziness or syncope Musculoskeletal: No new muscle or joint pain Hematologic: No bruising    Physical Exam:  Vital signs: BP 110/62   Pulse (!) 54   Ht 5' 11 (1.803 m)   Wt 237 lb 4 oz (107.6 kg)   BMI 33.09 kg/m    Wt  Readings from Last 3 Encounters:  10/11/23 237 lb 4 oz (107.6 kg)  10/08/23 239 lb (108.4 kg)  09/02/23 231 lb 3.2 oz (104.9 kg)    Constitutional: NAD, Well developed, Well nourished, alert and cooperative Head:  Normocephalic and atraumatic.  Eyes: No scleral icterus. Conjunctiva pink. Mouth: No oral lesions. Respiratory: Respirations even and unlabored. Lungs clear to auscultation bilaterally.  No wheezes, crackles, or rhonchi.  Cardiovascular:  Regular rate and rhythm. No murmurs. No peripheral edema. Gastrointestinal:  Soft, nondistended, nontender. No rebound or guarding. Normal bowel sounds. No appreciable masses or hepatomegaly. Rectal: Deferred to colonoscopy. Neurologic:  Alert and oriented x4;  grossly normal neurologically.  Skin:   Dry and intact without significant lesions or rashes. Psychiatric: Oriented to person, place and time. Demonstrates good judgement and reason without abnormal affect or behaviors.   RELEVANT LABS AND IMAGING: CBC    Component Value Date/Time   WBC 7.5 09/02/2023 1535   WBC 5.7 01/14/2023 0829   RBC 4.60 09/02/2023 1535   RBC 4.67 01/14/2023 0829   HGB 13.4 09/02/2023 1535   HCT 41.6 09/02/2023 1535   PLT 233 09/02/2023 1535   MCV 90 09/02/2023 1535   MCH 29.1 09/02/2023 1535   MCH 28.6 03/11/2021 0315   MCHC 32.2 09/02/2023 1535   MCHC 32.4 01/14/2023 0829   RDW 14.0 09/02/2023 1535   LYMPHSABS 1.6 01/14/2023 0829   MONOABS 0.5 01/14/2023 0829   EOSABS 0.1 01/14/2023 0829   BASOSABS 0.0 01/14/2023 0829    CMP     Component Value Date/Time   NA 142 09/02/2023 1535   K 4.4 09/02/2023 1535   CL 104 09/02/2023 1535   CO2 21 09/02/2023 1535   GLUCOSE 65 (L) 09/02/2023 1535   GLUCOSE 98 01/14/2023 0829   BUN 18 09/02/2023 1535   CREATININE 1.17 09/02/2023 1535   CREATININE 1.08 01/01/2020 1426   CALCIUM  9.4 09/02/2023 1535   PROT 6.5 01/14/2023 0829   PROT 6.7 05/14/2019  1721   ALBUMIN 4.2 01/14/2023 0829   ALBUMIN 4.8  05/14/2019 1721   AST 21 01/14/2023 0829   ALT 31 01/14/2023 0829   ALKPHOS 41 01/14/2023 0829   BILITOT 0.5 01/14/2023 0829   BILITOT 0.4 05/14/2019 1721   GFRNONAA >60 03/11/2021 0315   GFRAA 83 05/14/2019 1721   Echocardiogram 03/11/2021 1. Left ventricular ejection fraction, by estimation, is 60 to 65% . The left ventricle has normal function. The left ventricle has no regional wall motion abnormalities. There is mild left ventricular hypertrophy. Left ventricular diastolic parameters were normal.  2. Right ventricular systolic function is normal. The right ventricular size is normal. Tricuspid regurgitation signal is inadequate for assessing PA pressure.  3. There is a 1. 7 x 1. 3 cm semi mobile mass in the right atrium that appears to be attached to the atrial septum ( seen in subcostal views) . Differential would include atrial myxoma or possible thrombus. With patient presenting with acute vision loss and possible stroke would recommend TEE to better evaluate mass and also to look for any potential right to left shunting that could connect the right atrial finding to his clinical presentation.  4. The mitral valve is normal in structure. No evidence of mitral valve regurgitation. No evidence of mitral stenosis.  5. The aortic valve has an indeterminant number of cusps. There is moderate calcification of the aortic valve. There is moderate thickening of the aortic valve. Aortic valve regurgitation is not visualized. No aortic stenosis is present.  6. Aortic dilatation noted. There is mild dilatation of the ascending aorta, measuring 38 mm.  7. The inferior vena cava is normal in size with greater than 50% respiratory variability, suggesting right atrial pressure of 3 mmHg.  Assessment/Plan:   Bloody diarrhea Abdominal cramping Change in bowel habits History of adenomatous colon polyps Patient had 3 days of abdominal cramping and bloody diarrhea occurring about a month ago.  This has  since resolved. Denies any associated nausea, vomiting, fever, chills.  He had a negative GI pathogen panel and C. difficile. Last colonoscopy normal in 2018, but has history of adenomatous colon polyps seen on colonoscopy in 2012. Has been having abdominal bloating in the last few months as well as recent change in bowel habits.  Had been having some looser stools which resolved after starting a probiotic.  Also has intermittent constipation which is relieved by MiraLAX. Currently denies any abdominal pain. Prior to diarrheal episode his dose of irbesartan  had been increased.  He saw cardiology and this medication has been discontinued, Plavix  also discontinued.  He will start low-dose aspirin  pending GI workup. Differential includes ischemic colitis, infectious colitis, IBD, malignancy.  - Schedule colonoscopy. I thoroughly discussed the procedure with the patient to include nature of the procedure, alternatives, benefits, and risks (including but not limited to bleeding, infection, perforation, anesthesia/cardiac/pulmonary complications). Patient verbalized understanding and gave verbal consent to proceed with procedure.  - Check labs: CBC, CMP, ESR, CRP   Valiant Gaul, PA-C [facility] 10/11/2023, 7:59 AM  Patient Care Team: Ezell Hollow, MD as PCP - General Patwardhan, Kaye Parsons, MD as PCP - Cardiology (Cardiology) Loyde Rule, MD as Consulting Physician (Cardiology) Daneil Dunker Lytle Saunders, DO as Consulting Physician (Optometry)

## 2023-10-12 NOTE — Progress Notes (Signed)
 Attending Physician's Attestation   I have reviewed the chart.   I agree with the Advanced Practitioner's note, impression, and recommendations with any updates as below. Endoscopic evaluation is key at this point.  Though sounds like he may have had an ischemic colitis episode.  I am okay with patient being on aspirin  at this point, if it is needed prior to his upcoming procedure in the next couple of weeks.  Will rule out inflammatory bowel disease at that time as well.  Would add on celiac serologies to laboratories, just to ensure we do not need to do an upper endoscopy for his bloating symptoms.  Consider SIBO and EPI in future.   Aloha Finner, MD [facility] Advanced Endoscopy Office # [phone]

## 2023-10-13 NOTE — Progress Notes (Unsigned)
 Patient ID: Shane Wood                 DOB: 24-Jul-1955                      MRN: [id number]      HPI: Shane Wood is a 68 y.o. male referred by Dr. Elmira to HTN clinic. PMH is significant for hypertension, hyperlipidemia, CAD, hx of NSTEMI  2 separate blood pressure readings elevated so patient was advised to keep home BP log and bring it to PharmD at follow up visit. At last visit with PharmD BP was elevated (150/80-on 04/22/23) amlodipine  5 mg was added to metoprolol  12.5 mg twice daily. Pt also reported skipped heart beats - Zio patch was ordered by Dr.Patwardhan.  at last visit with me BP was elevated so irbesartan  150 mg was added to amlodipine  and metoprolol  was changed to Carvedilol  for better BP response. Irbesartan  was added to other BP meds to lower BP to goal post Aavapro initiation BMP was WNL.  Today patient presented for for follow up. Reports he does not check BP at home at all. Denies dizziness,HA, chest pain, SOB. Has been exercising regularly and follows low salt diet.    Current HTN meds: carvedilol  6.25 mg twice daily, Irbesartan  300 mg daily, and amlodipine  5 mg daily Previously tried: none  BP goal: <130/80     Family History:  Relation Problem Comments  Mother Metallurgist) Depression   Hypertension   Obesity     Father (Deceased) Lung cancer smoker    Maternal Grandmother (Deceased) Diabetes     Maternal Grandfather (Deceased) Diabetes     Paternal Grandmother (Deceased)   Paternal Grandfather (Deceased)   Other Heart attack uncle MI at age 61s    Other Stroke GM, in her 61      Social History:  Alcohol: 2 shots per day  Smoking: quit 30  yrs ago  Recreational drug use: never   Diet: low sat diet but still have canned food  Ocassionaly snack on salted nuts and chips  Drink: coffee 3-4 cups per day, water  - rest of the day   Exercise: 60 min stationary bike daily, weight training 2-3 times per week     Home BP readings: does not check BP  at home  Wt Readings from Last 3 Encounters:  10/11/23 237 lb 4 oz (107.6 kg)  10/08/23 239 lb (108.4 kg)  09/02/23 231 lb 3.2 oz (104.9 kg)   BP Readings from Last 3 Encounters:  10/11/23 110/62  09/02/23 138/68  09/01/23 126/78   Pulse Readings from Last 3 Encounters:  10/11/23 (!) 54  09/02/23 80  09/01/23 69    Renal function: Estimated Creatinine Clearance: 82.4 mL/min (by C-G formula based on SCr of 1.07 mg/dL).  Past Medical History:  Diagnosis Date   Allergy    BACK PAIN    ELEVATED BP READING WITHOUT DX HYPERTENSION 05/03/2008   Qualifier: Diagnosis of  By: Amon MD, Aloysius BRAVO.    GERD (gastroesophageal reflux disease)    H/O cardiovascular stress test 2005    (-)   Hyperlipidemia    INSOMNIA-SLEEP DISORDER-UNSPEC    PVC (premature ventricular contraction)     Current Outpatient Medications on File Prior to Visit  Medication Sig Dispense Refill   amLODipine  (NORVASC ) 5 MG tablet Take 1.5 tablets (7.5 mg total) by mouth daily. 135 tablet 3   Ascorbic Acid (VITAMIN C) 1000 MG tablet Take 1,000  mg by mouth every evening.     carvedilol  (COREG ) 25 MG tablet Take 1 tablet (25 mg total) by mouth 2 (two) times daily. 180 tablet 3   Cholecalciferol (VITAMIN D ) 125 MCG (5000 UT) CAPS Take 1 capsule by mouth in the morning.     dicyclomine  (BENTYL ) 20 MG tablet Take 1 tablet (20 mg total) by mouth 2 (two) times daily. (Patient not taking: Reported on 10/11/2023) 20 tablet 0   ezetimibe  (ZETIA ) 10 MG tablet TAKE 1 TABLET BY MOUTH EVERY DAY 90 tablet 2   Multiple Vitamin (MULTIVITAMIN) tablet Take 1 tablet by mouth every evening.     nitroGLYCERIN  (NITROSTAT ) 0.4 MG SL tablet Place 1 tablet (0.4 mg total) under the tongue every 5 (five) minutes x 3 doses as needed for chest pain. 25 tablet 3   ondansetron  (ZOFRAN -ODT) 4 MG disintegrating tablet Take 1 tablet (4 mg total) by mouth every 8 (eight) hours as needed for nausea or vomiting. (Patient not taking: Reported on 10/11/2023) 20  tablet 0   pantoprazole  (PROTONIX ) 40 MG tablet TAKE 1 TABLET BY MOUTH EVERY DAY 90 tablet 2   Probiotic Product (PROBIOTIC DAILY PO) Take by mouth.     rosuvastatin  (CRESTOR ) 40 MG tablet Take 1 tablet (40 mg total) by mouth at bedtime. 90 tablet 2   No current facility-administered medications on file prior to visit.    No Known Allergies  There were no vitals taken for this visit.   Assessment/Plan:  1. Hypertension -  No problem-specific Assessment & Plan notes found for this encounter.    Thank you  Robbi Blanch, Pharm.D [facility] A Division of [facility] [address] [place], [place] [address]  Phone: [phone]; Fax: [phone]

## 2023-10-14 ENCOUNTER — Encounter: Payer: Self-pay | Admitting: Pharmacist

## 2023-10-14 ENCOUNTER — Ambulatory Visit: Attending: Internal Medicine | Admitting: Pharmacist

## 2023-10-14 ENCOUNTER — Telehealth: Payer: Self-pay | Admitting: Gastroenterology

## 2023-10-14 VITALS — BP 148/80 | HR 58

## 2023-10-14 DIAGNOSIS — R109 Unspecified abdominal pain: Secondary | ICD-10-CM

## 2023-10-14 DIAGNOSIS — D649 Anemia, unspecified: Secondary | ICD-10-CM

## 2023-10-14 DIAGNOSIS — I10 Essential (primary) hypertension: Secondary | ICD-10-CM

## 2023-10-14 MED ORDER — AMLODIPINE BESYLATE 5 MG PO TABS: 5.0000 mg | ORAL_TABLET | Freq: Every day | ORAL | Status: AC

## 2023-10-14 MED ORDER — CHLORTHALIDONE 25 MG PO TABS: 25.0000 mg | ORAL_TABLET | Freq: Every day | ORAL | 3 refills | Status: AC

## 2023-10-14 NOTE — Telephone Encounter (Signed)
The pt has been advised 

## 2023-10-14 NOTE — Telephone Encounter (Signed)
Labs have been entered. 

## 2023-10-14 NOTE — Telephone Encounter (Signed)
 Please add on TTG and IgA to patient's future lab order, and also let him know that Dr. Wilhelmenia is okay with him starting low dose aspirin  if his cardiologist wants to do that prior to colonoscopy. Thank you

## 2023-10-14 NOTE — Patient Instructions (Signed)
 Changes made by your pharmacist Robbi Blanch, PharmD at today's visit:    Instructions/Changes  (what do you need to do) Your Notes  (what you did and when you did it)  Start taking Chlorthalidone 25 mg daily in the morning and reduce amlodipine  dose to 5 mg and if swelling persist reduce it to 2.5 mg daily    Continue taking Carvedilol  25 mg twice daily     Bring all of your meds, your BP cuff and your record of home blood pressures to your next appointment.    HOW TO TAKE YOUR BLOOD PRESSURE AT HOME  Rest 5 minutes before taking your blood pressure.  Don't smoke or drink caffeinated beverages for at least 30 minutes before. Take your blood pressure before (not after) you eat. Sit comfortably with your back supported and both feet on the floor (don't cross your legs). Elevate your arm to heart level on a table or a desk. Use the proper sized cuff. It should fit smoothly and snugly around your bare upper arm. There should be enough room to slip a fingertip under the cuff. The bottom edge of the cuff should be 1 inch above the crease of the elbow. Ideally, take 3 measurements at one sitting and record the average.  Important lifestyle changes to control high blood pressure  Intervention  Effect on the BP  Lose extra pounds and watch your waistline Weight loss is one of the most effective lifestyle changes for controlling blood pressure. If you're overweight or obese, losing even a small amount of weight can help reduce blood pressure. Blood pressure might go down by about 1 millimeter of mercury (mm Hg) with each kilogram (about 2.2 pounds) of weight lost.  Exercise regularly As a general goal, aim for at least 30 minutes of moderate physical activity every day. Regular physical activity can lower high blood pressure by about 5 to 8 mm Hg.  Eat a healthy diet Eating a diet rich in whole grains, fruits, vegetables, and low-fat dairy products and low in saturated fat and cholesterol. A  healthy diet can lower high blood pressure by up to 11 mm Hg.  Reduce salt (sodium) in your diet Even a small reduction of sodium in the diet can improve heart health and reduce high blood pressure by about 5 to 6 mm Hg.  Limit alcohol One drink equals 12 ounces of beer, 5 ounces of wine, or 1.5 ounces of 80-proof liquor.  Limiting alcohol to less than one drink a day for women or two drinks a day for men can help lower blood pressure by about 4 mm Hg.   If you have any questions or concerns please use My Chart to send questions or call the office at [phone]

## 2023-10-14 NOTE — Assessment & Plan Note (Signed)
 Assessment: BP is uncontrolled in office BP 148/80 mmHg heart rate 58 (goal<130/80). Home BP ~  148/89 mmHg  Still has lower leg swelling on amlodipine  7.5 mg dose and tolerates carvedilol  25 mg twice daily  Denies SOB, palpitation, chest pain, headaches,or swelling Follows heart healthy diet and exercise regularly   Plan:  Start taking chlorthalidone 25 mg daily and reduce dose of amlodipine  to 5 mg daily  Continue taking  carvedilol  25 mg twice daily  Patient to keep record of BP readings with heart rate and report to us  at the next visit Patient to see PharmD in 3-4 weeks for follow up  Follow up lab(s): BMP at the next OV in 3 weeks to assess electrolytes and renal function

## 2023-10-18 ENCOUNTER — Other Ambulatory Visit (INDEPENDENT_AMBULATORY_CARE_PROVIDER_SITE_OTHER)

## 2023-10-18 DIAGNOSIS — D649 Anemia, unspecified: Secondary | ICD-10-CM

## 2023-10-18 DIAGNOSIS — R109 Unspecified abdominal pain: Secondary | ICD-10-CM | POA: Diagnosis not present

## 2023-10-18 LAB — CBC WITH DIFFERENTIAL/PLATELET
Basophils Absolute: 0 10*3/uL (ref 0.0–0.1)
Basophils Relative: 0.3 % (ref 0.0–3.0)
Eosinophils Absolute: 0.2 10*3/uL (ref 0.0–0.7)
Eosinophils Relative: 2 % (ref 0.0–5.0)
HCT: 40.8 % (ref 39.0–52.0)
Hemoglobin: 13.8 g/dL (ref 13.0–17.0)
Lymphocytes Relative: 22.5 % (ref 12.0–46.0)
Lymphs Abs: 1.7 10*3/uL (ref 0.7–4.0)
MCHC: 33.7 g/dL (ref 30.0–36.0)
MCV: 85.3 fl (ref 78.0–100.0)
Monocytes Absolute: 0.6 10*3/uL (ref 0.1–1.0)
Monocytes Relative: 7.7 % (ref 3.0–12.0)
Neutro Abs: 5.1 10*3/uL (ref 1.4–7.7)
Neutrophils Relative %: 67.5 % (ref 43.0–77.0)
Platelets: 254 10*3/uL (ref 150.0–400.0)
RBC: 4.78 Mil/uL (ref 4.22–5.81)
RDW: 13.8 % (ref 11.5–15.5)
WBC: 7.5 10*3/uL (ref 4.0–10.5)

## 2023-10-18 LAB — FOLATE: Folate: 9.5 ng/mL (ref >5.9)

## 2023-10-18 LAB — IBC + FERRITIN
Ferritin: 65.2 ng/mL (ref 22.0–322.0)
Iron: 74 ug/dL (ref 42–165)
Saturation Ratios: 20.7 % (ref 20.0–50.0)
TIBC: 357 ug/dL (ref 250.0–450.0)
Transferrin: 255 mg/dL (ref 212.0–360.0)

## 2023-10-18 LAB — VITAMIN B12: Vitamin B-12: 411 pg/mL (ref 211–911)

## 2023-10-19 LAB — IGA: Immunoglobulin A: 165 mg/dL (ref 70–320)

## 2023-10-19 LAB — TISSUE TRANSGLUTAMINASE, IGA: (tTG) Ab, IgA: <1 U/mL

## 2023-10-21 ENCOUNTER — Ambulatory Visit: Payer: Self-pay | Admitting: Gastroenterology

## 2023-10-22 ENCOUNTER — Encounter: Payer: Self-pay | Admitting: Gastroenterology

## 2023-10-30 ENCOUNTER — Ambulatory Visit: Admitting: Gastroenterology

## 2023-10-30 ENCOUNTER — Encounter: Payer: Self-pay | Admitting: Gastroenterology

## 2023-10-30 VITALS — BP 145/74 | HR 63 | Temp 97.5°F | Resp 12 | Ht 71.0 in | Wt 237.0 lb

## 2023-10-30 DIAGNOSIS — D124 Benign neoplasm of descending colon: Secondary | ICD-10-CM

## 2023-10-30 DIAGNOSIS — I493 Ventricular premature depolarization: Secondary | ICD-10-CM | POA: Diagnosis not present

## 2023-10-30 DIAGNOSIS — D123 Benign neoplasm of transverse colon: Secondary | ICD-10-CM | POA: Diagnosis not present

## 2023-10-30 DIAGNOSIS — Z1211 Encounter for screening for malignant neoplasm of colon: Secondary | ICD-10-CM | POA: Diagnosis not present

## 2023-10-30 DIAGNOSIS — Z860101 Personal history of adenomatous and serrated colon polyps: Secondary | ICD-10-CM

## 2023-10-30 DIAGNOSIS — D125 Benign neoplasm of sigmoid colon: Secondary | ICD-10-CM

## 2023-10-30 DIAGNOSIS — K641 Second degree hemorrhoids: Secondary | ICD-10-CM

## 2023-10-30 DIAGNOSIS — D120 Benign neoplasm of cecum: Secondary | ICD-10-CM | POA: Diagnosis not present

## 2023-10-30 DIAGNOSIS — R197 Diarrhea, unspecified: Secondary | ICD-10-CM | POA: Diagnosis not present

## 2023-10-30 DIAGNOSIS — D122 Benign neoplasm of ascending colon: Secondary | ICD-10-CM

## 2023-10-30 MED ORDER — SODIUM CHLORIDE 0.9 % IV SOLN: 500.0000 mL | Freq: Once | INTRAVENOUS | Status: DC

## 2023-10-30 NOTE — Progress Notes (Unsigned)
 Sedate, gd SR, tolerated procedure well, VSS, report to RN

## 2023-10-30 NOTE — Progress Notes (Unsigned)
 Called to room to assist during endoscopic procedure.  Patient ID and intended procedure confirmed with present staff. Received instructions for my participation in the procedure from the performing physician.

## 2023-10-30 NOTE — Progress Notes (Unsigned)
 GASTROENTEROLOGY PROCEDURE H&P NOTE   Primary Care Physician: Amon Aloysius BRAVO, MD  HPI: Shane Wood is a 68 y.o. male  who presents for Colonoscopy for change in bowel habits and blood in stools and reported history of prior polyps.  Past Medical History:  Diagnosis Date   Allergy    BACK PAIN    ELEVATED BP READING WITHOUT DX HYPERTENSION 05/03/2008   Qualifier: Diagnosis of  By: Amon MD, Aloysius BRAVO.    GERD (gastroesophageal reflux disease)    H/O cardiovascular stress test 2005    (-)   Hyperlipidemia    INSOMNIA-SLEEP DISORDER-UNSPEC    PVC (premature ventricular contraction)    Past Surgical History:  Procedure Laterality Date   ARTERY REPAIR Right 08/23/2017   Procedure: EXPLORATION RIGHT RADIAL ARTERY,  RIGHT FOREARM FASCIOTOMY;  Surgeon: Harvey Carlin BRAVO, MD;  Location: [facility] OR;  Service: Vascular;  Laterality: Right;   BUBBLE STUDY  03/24/2021   Procedure: BUBBLE STUDY;  Surgeon: Shlomo Wilbert SAUNDERS, MD;  Location: [facility] ENDOSCOPY;  Service: Cardiovascular;;   CHOLECYSTECTOMY     CORONARY STENT INTERVENTION N/A 08/23/2017   Procedure: CORONARY STENT INTERVENTION;  Surgeon: Dann Candyce RAMAN, MD;  Location: [facility] INVASIVE CV LAB;  Service: Cardiovascular;  Laterality: N/A;   LEFT HEART CATH AND CORONARY ANGIOGRAPHY N/A 08/23/2017   Procedure: LEFT HEART CATH AND CORONARY ANGIOGRAPHY;  Surgeon: Dann Candyce RAMAN, MD;  Location: [facility] INVASIVE CV LAB;  Service: Cardiovascular;  Laterality: N/A;   POLYPECTOMY     TEE WITHOUT CARDIOVERSION N/A 03/24/2021   Procedure: TRANSESOPHAGEAL ECHOCARDIOGRAM (TEE);  Surgeon: Shlomo Wilbert SAUNDERS, MD;  Location: [facility] ENDOSCOPY;  Service: Cardiovascular;  Laterality: N/A;   TONSILLECTOMY     age 31 y/o   Current Outpatient Medications  Medication Sig Dispense Refill   amLODipine  (NORVASC ) 5 MG tablet Take 1 tablet (5 mg total) by mouth daily.     Ascorbic Acid (VITAMIN C) 1000 MG tablet Take 1,000 mg by mouth every evening.     carvedilol  (COREG ) 25 MG  tablet Take 1 tablet (25 mg total) by mouth 2 (two) times daily. 180 tablet 3   chlorthalidone  (HYGROTON ) 25 MG tablet Take 1 tablet (25 mg total) by mouth daily. 90 tablet 3   Cholecalciferol (VITAMIN D ) 125 MCG (5000 UT) CAPS Take 1 capsule by mouth in the morning.     dicyclomine  (BENTYL ) 20 MG tablet Take 1 tablet (20 mg total) by mouth 2 (two) times daily. (Patient not taking: Reported on 10/11/2023) 20 tablet 0   ezetimibe  (ZETIA ) 10 MG tablet TAKE 1 TABLET BY MOUTH EVERY DAY 90 tablet 2   Multiple Vitamin (MULTIVITAMIN) tablet Take 1 tablet by mouth every evening.     nitroGLYCERIN  (NITROSTAT ) 0.4 MG SL tablet Place 1 tablet (0.4 mg total) under the tongue every 5 (five) minutes x 3 doses as needed for chest pain. 25 tablet 3   ondansetron  (ZOFRAN -ODT) 4 MG disintegrating tablet Take 1 tablet (4 mg total) by mouth every 8 (eight) hours as needed for nausea or vomiting. (Patient not taking: Reported on 10/11/2023) 20 tablet 0   pantoprazole  (PROTONIX ) 40 MG tablet TAKE 1 TABLET BY MOUTH EVERY DAY 90 tablet 2   Probiotic Product (PROBIOTIC DAILY PO) Take by mouth.     rosuvastatin  (CRESTOR ) 40 MG tablet Take 1 tablet (40 mg total) by mouth at bedtime. 90 tablet 2   No current facility-administered medications for this visit.    Current Outpatient Medications:  amLODipine  (NORVASC ) 5 MG tablet, Take 1 tablet (5 mg total) by mouth daily., Disp: , Rfl:    Ascorbic Acid (VITAMIN C) 1000 MG tablet, Take 1,000 mg by mouth every evening., Disp: , Rfl:    carvedilol  (COREG ) 25 MG tablet, Take 1 tablet (25 mg total) by mouth 2 (two) times daily., Disp: 180 tablet, Rfl: 3   chlorthalidone  (HYGROTON ) 25 MG tablet, Take 1 tablet (25 mg total) by mouth daily., Disp: 90 tablet, Rfl: 3   Cholecalciferol (VITAMIN D ) 125 MCG (5000 UT) CAPS, Take 1 capsule by mouth in the morning., Disp: , Rfl:    dicyclomine  (BENTYL ) 20 MG tablet, Take 1 tablet (20 mg total) by mouth 2 (two) times daily. (Patient not  taking: Reported on 10/11/2023), Disp: 20 tablet, Rfl: 0   ezetimibe  (ZETIA ) 10 MG tablet, TAKE 1 TABLET BY MOUTH EVERY DAY, Disp: 90 tablet, Rfl: 2   Multiple Vitamin (MULTIVITAMIN) tablet, Take 1 tablet by mouth every evening., Disp: , Rfl:    nitroGLYCERIN  (NITROSTAT ) 0.4 MG SL tablet, Place 1 tablet (0.4 mg total) under the tongue every 5 (five) minutes x 3 doses as needed for chest pain., Disp: 25 tablet, Rfl: 3   ondansetron  (ZOFRAN -ODT) 4 MG disintegrating tablet, Take 1 tablet (4 mg total) by mouth every 8 (eight) hours as needed for nausea or vomiting. (Patient not taking: Reported on 10/11/2023), Disp: 20 tablet, Rfl: 0   pantoprazole  (PROTONIX ) 40 MG tablet, TAKE 1 TABLET BY MOUTH EVERY DAY, Disp: 90 tablet, Rfl: 2   Probiotic Product (PROBIOTIC DAILY PO), Take by mouth., Disp: , Rfl:    rosuvastatin  (CRESTOR ) 40 MG tablet, Take 1 tablet (40 mg total) by mouth at bedtime., Disp: 90 tablet, Rfl: 2 No Known Allergies Family History  Problem Relation Age of Onset   Lung cancer Father        smoker   Diabetes Maternal Grandmother    Diabetes Maternal Grandfather    Hypertension Mother    Depression Mother    Obesity Mother    Heart attack Other        uncle MI at age 70s   Stroke Other        GM, in her 13   Colon cancer Neg Hx    Prostate cancer Neg Hx    Colon polyps Neg Hx    Esophageal cancer Neg Hx    Rectal cancer Neg Hx    Stomach cancer Neg Hx    Pancreatic cancer Neg Hx    Social History   Socioeconomic History   Marital status: Married    Spouse name: Slater   Number of children: 2   Years of education: Not on file   Highest education level: Master's degree (e.g., MA, MS, MEng, MEd, MSW, MBA)  Occupational History   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  Tobacco Use   Smoking status: Former    Average packs/day: 1 pack/day for 14.0 years (14.0 ttl pk-yrs)    Types: Cigarettes    Start date: 19   Smokeless tobacco: Former     Types: Chew    Quit date: 1970   Tobacco comments:    1 ppd , quit 1989  Vaping Use   Vaping status: Never Used  Substance and Sexual Activity   Alcohol use: Yes    Alcohol/week: 4.0 standard drinks of alcohol    Types: 4 Glasses of wine per week    Comment: occasionally on weekends  Drug use: No   Sexual activity: Not on file  Other Topics Concern   Not on file  Social History Narrative   Household pt and wife    2 adult married children   Social Drivers of Health   Financial Resource Strain: Low Risk  (10/01/2023)   Overall Financial Resource Strain (CARDIA)    Difficulty of Paying Living Expenses: Not hard at all  Food Insecurity: No Food Insecurity (10/01/2023)   Hunger Vital Sign    Worried About Running Out of Food in the Last Year: Never true    Ran Out of Food in the Last Year: Never true  Transportation Needs: No Transportation Needs (10/01/2023)   PRAPARE - Administrator, Civil Service (Medical): No    Lack of Transportation (Non-Medical): No  Physical Activity: Sufficiently Active (10/01/2023)   Exercise Vital Sign    Days of Exercise per Week: 7 days    Minutes of Exercise per Session: 60 min  Stress: No Stress Concern Present (10/01/2023)   [employer] of Occupational Health - Occupational Stress Questionnaire    Feeling of Stress : Only a little  Social Connections: Socially Integrated (10/01/2023)   Social Connection and Isolation Panel    Frequency of Communication with Friends and Family: More than three times a week    Frequency of Social Gatherings with Friends and Family: More than three times a week    Attends Religious Services: More than 4 times per year    Active Member of [employer] or Organizations: Yes    Attends Engineer, structural: More than 4 times per year    Marital Status: Married  Catering manager Violence: Not At Risk (09/27/2022)   Humiliation, Afraid, Rape, and Kick questionnaire    Fear of Current or Ex-Partner: No     Emotionally Abused: No    Physically Abused: No    Sexually Abused: No    Physical Exam: There were no vitals filed for this visit. There is no height or weight on file to calculate BMI. GEN: NAD EYE: Sclerae anicteric ENT: MMM CV: Non-tachycardic GI: Soft, NT/ND NEURO:  Alert & Oriented x 3  Lab Results: No results for input(s): WBC, HGB, HCT, PLT in the last 72 hours. BMET No results for input(s): NA, K, CL, CO2, GLUCOSE, BUN, CREATININE, CALCIUM  in the last 72 hours. LFT No results for input(s): PROT, ALBUMIN, AST, ALT, ALKPHOS, BILITOT, BILIDIR, IBILI in the last 72 hours. PT/INR No results for input(s): LABPROT, INR in the last 72 hours.   Impression / Plan: This is a 68 y.o.male who presents for Colonoscopy for change in bowel habits and blood in stools and reported history of prior polyps.  The risks and benefits of endoscopic evaluation/treatment were discussed with the patient and/or family; these include but are not limited to the risk of perforation, infection, bleeding, missed lesions, lack of diagnosis, severe illness requiring hospitalization, as well as anesthesia and sedation related illnesses.  The patient's history has been reviewed, patient examined, no change in status, and deemed stable for procedure.  The patient and/or family is agreeable to proceed.    Aloha Finner, MD [facility] Office # [phone]

## 2023-10-30 NOTE — Op Note (Signed)
 [facility] Patient Name: Shane Wood Procedure Date: 10/30/2023 9:42 AM MRN: [id number] Endoscopist: Aloha Finner , MD, [id number] Age: 68 Referring MD:  Date of Birth: 1956-01-24 Gender: Male Account #: [id number] Procedure:                Colonoscopy Indications:              Surveillance: Personal history of adenomatous                            polyps on last colonoscopy > 5 years ago, History                            of prior hemorrhagic colitis and diarrhea Medicines:                Monitored Anesthesia Care Procedure:                Pre-Anesthesia Assessment:                           - Prior to the procedure, a History and Physical                            was performed, and patient medications and                            allergies were reviewed. The patient's tolerance of                            previous anesthesia was also reviewed. The risks                            and benefits of the procedure and the sedation                            options and risks were discussed with the patient.                            All questions were answered, and informed consent                            was obtained. Prior Anticoagulants: The patient has                            taken no anticoagulant or antiplatelet agents. ASA                            Grade Assessment: II - A patient with mild systemic                            disease. After reviewing the risks and benefits,                            the patient was deemed in satisfactory condition to  undergo the procedure.                           After obtaining informed consent, the colonoscope                            was passed under direct vision. Throughout the                            procedure, the patient's blood pressure, pulse, and                            oxygen saturations were monitored continuously. The                            Olympus Scope SN:  [id number] was introduced through                            the anus and advanced to the the cecum, identified                            by appendiceal orifice and ileocecal valve. The                            colonoscopy was performed without difficulty. The                            patient tolerated the procedure. The quality of the                            bowel preparation was adequate. The ileocecal                            valve, appendiceal orifice, and rectum were                            photographed. Scope In: 9:54:37 AM Scope Out: 11:00:58 AM Scope Withdrawal Time: 1 hour 2 minutes 52 seconds  Total Procedure Duration: 1 hour 6 minutes 21 seconds  Findings:                 The digital rectal exam findings include                            hemorrhoids. Pertinent negatives include no                            palpable rectal lesions.                           A large amount of semi-liquid stool was found in                            the entire colon, interfering with visualization.  Lavage of the area was performed using copious                            amounts, resulting in clearance with adequate                            visualization.                           Ten sessile polyps were found in the transverse                            colon, hepatic flexure, ascending colon and cecum.                            The polyps were 2 to 9 mm in size. These polyps                            were removed with a cold snare. Resection and                            retrieval were complete.                           Two pedunculated polyps were found in the sigmoid                            colon and descending colon. The polyps were 15 to                            20 mm in size. These polyps were removed with a hot                            snare. Resection and retrieval were complete.                           A 35 mm polyp was found in the  sigmoid colon. The                            polyp was pedunculated. Preparations were made for                            mucosal resection. Demarcation of the lesion was                            performed with high-definition white light and                            narrow band imaging to clearly identify the                            boundaries of the lesion. Saline was injected to  raise the lesion with good effect to make a major                            cushion. Snare mucosal resection was performed.                            Resection and retrieval were complete. Resected                            tissue margins were examined and clear of polyp                            tissue. Unfortunately, bleeding began to ensue.                            Using snare tip soft coagulation I was able to slow                            the bleeding down. The resection area was                            successfully injected with 3 mL of a 1:100,000                            solution of epinephrine for hemostasis (3 mL) that                            further aided in visualization and hemostasis. To                            further prevent bleeding after mucosal resection,                            five hemostatic clips were successfully placed (MR                            conditional). There was no bleeding at the end of                            the procedure. Area on contralateral wall was                            tattooed with an injection of Spot (carbon black).                           Multiple medium-mouthed and small-mouthed                            diverticula were found in the recto-sigmoid colon,                            sigmoid colon and descending colon.  Normal mucosa was found in the entire colon                            otherwise. Biopsies for histology were taken with a                            cold  forceps from the entire colon for evaluation                            of microscopic colitis.                           Non-bleeding non-thrombosed external and internal                            hemorrhoids were found during retroflexion, during                            perianal exam and during digital exam. The                            hemorrhoids were Grade II (internal hemorrhoids                            that prolapse but reduce spontaneously). Complications:            No immediate complications. Estimated blood loss:                            Minimal. Estimated Blood Loss:     Estimated blood loss was minimal. Impression:               - Hemorrhoids found on digital rectal exam.                           - Stool in the entire examined colon.                           - Ten 2 to 9 mm polyps in the transverse colon, at                            the hepatic flexure, in the ascending colon and in                            the cecum, removed with a cold snare. Resected and                            retrieved.                           - Two 15 to 20 mm polyps in the sigmoid colon and                            in the descending colon, removed with a hot snare.  Resected and retrieved.                           - One 35 mm polyp in the sigmoid colon, removed                            with mucosal resection. Resected and retrieved.                            Bleeding occurred. STSC to the bleeding vessel                            performed. Injected epinephrine. Clips (MR                            conditional) were placed. Tattoo placed on                            contralateral wall.                           - Diverticulosis in the recto-sigmoid colon, in the                            sigmoid colon and in the descending colon.                           - Normal mucosa in the entire examined colon                            otherwise.  Biopsied.                           - Non-bleeding non-thrombosed external and internal                            hemorrhoids. Recommendation:           - The patient will be observed post-procedure,                            until all discharge criteria are met.                           - Discharge patient to home.                           - Patient has a contact number available for                            emergencies. The signs and symptoms of potential                            delayed complications were discussed with the  patient. Return to normal activities tomorrow.                            Written discharge instructions were provided to the                            patient.                           - High fiber diet.                           - Use FiberCon 1-2 tablets PO daily.                           - No nonsteroidals or aspirin  for 1 week to                            decrease risk of post interventional bleeding.                           - Continue present medications.                           - Await pathology results.                           - Repeat colonoscopy in 1 year for surveillance,                            due to number of polyps found.                           - The findings and recommendations were discussed                            with the patient.                           - The findings and recommendations were discussed                            with the patient's family. Aloha Finner, MD 10/30/2023 11:13:55 AM

## 2023-10-30 NOTE — Patient Instructions (Signed)
 Educational handout provided to patient related to Hemorrhoids, Polyps, and Diverticulosis  High fiber diet  CLIP CARD GIVEN  Continue present medications- NO Non steroidals or Asprin for 1 week to decrease risk of post interventional bleeding.  Awaiting pathology results   YOU HAD AN ENDOSCOPIC PROCEDURE TODAY AT THE [facility]:   Refer to the procedure report that was given to you for any specific questions about what was found during the examination.  If the procedure report does not answer your questions, please call your gastroenterologist to clarify.  If you requested that your care partner not be given the details of your procedure findings, then the procedure report has been included in a sealed envelope for you to review at your convenience later.  YOU SHOULD EXPECT: Some feelings of bloating in the abdomen. Passage of more gas than usual.  Walking can help get rid of the air that was put into your GI tract during the procedure and reduce the bloating. If you had a lower endoscopy (such as a colonoscopy or flexible sigmoidoscopy) you may notice spotting of blood in your stool or on the toilet paper. If you underwent a bowel prep for your procedure, you may not have a normal bowel movement for a few days.  Please Note:  You might notice some irritation and congestion in your nose or some drainage.  This is from the oxygen used during your procedure.  There is no need for concern and it should clear up in a day or so.  SYMPTOMS TO REPORT IMMEDIATELY:  Following lower endoscopy (colonoscopy or flexible sigmoidoscopy):  Excessive amounts of blood in the stool  Significant tenderness or worsening of abdominal pains  Swelling of the abdomen that is new, acute  Fever of 100F or higher  For urgent or emergent issues, a gastroenterologist can be reached at any hour by calling [phone]. Do not use MyChart messaging for urgent concerns.    DIET:  We do recommend a  small meal at first, but then you may proceed to your regular diet.  Drink plenty of fluids but you should avoid alcoholic beverages for 24 hours.  ACTIVITY:  You should plan to take it easy for the rest of today and you should NOT DRIVE or use heavy machinery until tomorrow (because of the sedation medicines used during the test).    FOLLOW UP: Our staff will call the number listed on your records the next business day following your procedure.  We will call around 7:15- 8:00 am to check on you and address any questions or concerns that you may have regarding the information given to you following your procedure. If we do not reach you, we will leave a message.     If any biopsies were taken you will be contacted by phone or by letter within the next 1-3 weeks.  Please call us  at [phone] if you have not heard about the biopsies in 3 weeks.    SIGNATURES/CONFIDENTIALITY: You and/or your care partner have signed paperwork which will be entered into your electronic medical record.  These signatures attest to the fact that that the information above on your After Visit Summary has been reviewed and is understood.  Full responsibility of the confidentiality of this discharge information lies with you and/or your care-partner.

## 2023-10-30 NOTE — Progress Notes (Unsigned)
 Per MD to watch patient extra 30 minutes due to bleeding during procedure.

## 2023-10-31 ENCOUNTER — Telehealth: Payer: Self-pay

## 2023-10-31 NOTE — Telephone Encounter (Signed)
  Follow up Call-     10/30/2023    9:26 AM  Call back number  Post procedure Call Back phone  # [phone]  Permission to leave phone message Yes     Patient questions:  Do you have a fever, pain , or abdominal swelling? No. Pain Score  0 *  Have you tolerated food without any problems? Yes.    Have you been able to return to your normal activities? Yes.    Do you have any questions about your discharge instructions: Diet   No. Medications  No. Follow up visit  No.  Do you have questions or concerns about your Care? No.  Actions: * If pain score is 4 or above: No action needed, pain <4.

## 2023-11-04 DIAGNOSIS — I10 Essential (primary) hypertension: Secondary | ICD-10-CM | POA: Diagnosis not present

## 2023-11-04 LAB — BASIC METABOLIC PANEL WITH GFR
BUN/Creatinine Ratio: 15 (ref 10–24)
BUN: 21 mg/dL (ref 8–27)
CO2: 25 mmol/L (ref 20–29)
Calcium: 9.8 mg/dL (ref 8.6–10.2)
Chloride: 100 mmol/L (ref 96–106)
Creatinine, Ser: 1.38 mg/dL — ABNORMAL HIGH (ref 0.76–1.27)
Glucose: 96 mg/dL (ref 70–99)
Potassium: 3.8 mmol/L (ref 3.5–5.2)
Sodium: 142 mmol/L (ref 134–144)
eGFR: 56 mL/min/1.73 — ABNORMAL LOW (ref >59)

## 2023-11-04 LAB — SURGICAL PATHOLOGY

## 2023-11-05 ENCOUNTER — Ambulatory Visit: Payer: Self-pay | Admitting: Gastroenterology

## 2023-11-07 ENCOUNTER — Encounter: Payer: Self-pay | Admitting: Pharmacist

## 2023-11-07 ENCOUNTER — Ambulatory Visit: Attending: Cardiology | Admitting: Pharmacist

## 2023-11-07 VITALS — BP 124/74 | HR 67

## 2023-11-07 DIAGNOSIS — I10 Essential (primary) hypertension: Secondary | ICD-10-CM

## 2023-11-07 NOTE — Assessment & Plan Note (Addendum)
 Assessment: 1st BP reading 143/79 heart rate 67 2nd reading 124/74  Home BP >130 - 150/80- 89 mmHg - will be buying new BP monitor  Leg swelling improved now that he is on lower dose amlodipine , SrCr went up last BMP - post thiazide start however pt went for colonoscopy 4 day before BMP so will get updated BMP to assess SrCr   Notices occasional orthostatic dizziness  Denies SOB, palpitation, chest pain, headaches,or swelling Follows heart healthy diet and exercise regularly   Responced well on irbesartan  - developed GI intolerance so d/c'd it   Plan:  Continue taking current BP medications (carvedilol  25 mg twice daily, amlodipine  5 mg daily, chlorthalidone  25 mg daily) Will get updated BMP to get SrCr. If renal function back to baseline may consider changing thiazide diuretics to ARB agent since past good response were noted on irbesartan  ( potentially valsartan 80 mg twice daily)  Patient to keep record of BP readings with heart rate and report to us  at the next visit Patient to see PharmD in 4 weeks for follow up  Follow up lab(s): BMP today

## 2023-11-07 NOTE — Patient Instructions (Addendum)
 Changes made by your pharmacist Robbi Blanch, PharmD at today's visit:    Instructions/Changes  (what do you need to do) Your Notes  (what you did and when you did it)  Continue taking current BP medications    Get BMP checked today and if kidney function back to normal we will add Valsartan to current BP medication    Continue following low salt diet     Bring all of your meds, your BP cuff and your record of home blood pressures to your next appointment.    HOW TO TAKE YOUR BLOOD PRESSURE AT HOME  Rest 5 minutes before taking your blood pressure.  Don't smoke or drink caffeinated beverages for at least 30 minutes before. Take your blood pressure before (not after) you eat. Sit comfortably with your back supported and both feet on the floor (don't cross your legs). Elevate your arm to heart level on a table or a desk. Use the proper sized cuff. It should fit smoothly and snugly around your bare upper arm. There should be enough room to slip a fingertip under the cuff. The bottom edge of the cuff should be 1 inch above the crease of the elbow. Ideally, take 3 measurements at one sitting and record the average.  Important lifestyle changes to control high blood pressure  Intervention  Effect on the BP  Lose extra pounds and watch your waistline Weight loss is one of the most effective lifestyle changes for controlling blood pressure. If you're overweight or obese, losing even a small amount of weight can help reduce blood pressure. Blood pressure might go down by about 1 millimeter of mercury (mm Hg) with each kilogram (about 2.2 pounds) of weight lost.  Exercise regularly As a general goal, aim for at least 30 minutes of moderate physical activity every day. Regular physical activity can lower high blood pressure by about 5 to 8 mm Hg.  Eat a healthy diet Eating a diet rich in whole grains, fruits, vegetables, and low-fat dairy products and low in saturated fat and cholesterol. A  healthy diet can lower high blood pressure by up to 11 mm Hg.  Reduce salt (sodium) in your diet Even a small reduction of sodium in the diet can improve heart health and reduce high blood pressure by about 5 to 6 mm Hg.  Limit alcohol One drink equals 12 ounces of beer, 5 ounces of wine, or 1.5 ounces of 80-proof liquor.  Limiting alcohol to less than one drink a day for women or two drinks a day for men can help lower blood pressure by about 4 mm Hg.   If you have any questions or concerns please use My Chart to send questions or call the office at [phone]

## 2023-11-07 NOTE — Progress Notes (Signed)
 Patient ID: Shane Wood                 DOB: [birth year]                      MRN: [id number]      HPI: Shane Wood is a 68 y.o. male referred by Dr. Elmira to HTN clinic. PMH is significant for hypertension, hyperlipidemia, CAD, hx of NSTEMI  2 separate blood pressure readings elevated so patient was advised to keep home BP log and bring it to PharmD at follow up visit. At last visit with PharmD BP was elevated (150/80-on 04/22/23) amlodipine  5 mg was added to metoprolol  12.5 mg twice daily. Pt also reported skipped heart beats - Zio patch was ordered by Dr.Patwardhan.  at last visit with me BP was elevated so irbesartan  150 mg was added to amlodipine  and metoprolol  was changed to Carvedilol  for better BP response. Irbesartan  was added to other BP meds to lower BP to goal post Aavapro initiation BMP was WNL.  Patient had difficulty tolerating irbesartan  300 mg due to severe gastrointestinal upset. Medication was switched to amlodipine . However, doses >5 mg of amlodipine  led to lower extremity swelling. Currently on 7.5 mg amlodipine , with noted persistent swelling--more pronounced in the left leg.  Carvedilol  dose was recently increased from 6.25 mg BID to 25 mg BID (1 week ago). Patient reports that since the dose increase, exercise heart rate does not rise above 100 bpm. At last visit we reduce amlodipine  dose down to 5 mg from 7.5 mg to improve swelling. And added Chlorthalidone  25 mg daily. Post BMP SrCr went up almost 28%  Today, the patient presented for follow-up and reports that his home blood pressure readings remain elevated, generally between 130 and 150 mmHg systolic. He states that chlorthalidone  has not made a noticeable difference, and he has not experienced any diuretic effects from it. The patient recently underwent a colonoscopy a few days prior to his last BMP; an updated BMP will be obtained today. He responded well to irbesartan  but developed gastrointestinal intolerance.  If the updated BMP shows his serum creatinine has returned to baseline, we may consider replacing chlorthalidone  with valsartan 80 mg twice daily. The patient also believes his home blood pressure monitor consistently reads about 10 points higher than his office measurements, despite it being new; he plans to purchase a new monitor for accuracy. His swelling has improved, and he denies shortness of breath or palpitations but reports dizziness when changing positions.  Current HTN meds: carvedilol  25 mg twice daily, amlodipine  5 mg daily, chlorthalidone  25 mg daily  Previously tried: Irbesartan  300 mg - diarrhea and Norvasc  >5 mg dose -lower legs swelling  BP goal: <130/80     Family History:  Relation Problem Comments  Mother Metallurgist) Depression   Hypertension   Obesity     Father (Deceased) Lung cancer smoker    Maternal Grandmother (Deceased) Diabetes     Maternal Grandfather (Deceased) Diabetes     Paternal Grandmother (Deceased)   Paternal Grandfather (Deceased)   Other Heart attack uncle MI at age 68s    Other Stroke GM, in her 12      Social History:  Alcohol: 2 shots per day  Smoking: quit 30  yrs ago  Recreational drug use: never   Diet: low sat diet but still have canned food  Ocassionaly snack on salted nuts and chips  Drink: coffee 3-4 cups per day, water  -  rest of the day   Exercise: 60 min stationary bike daily, weight training 2-3 times per week     Home BP readings: does not check BP at home  Wt Readings from Last 3 Encounters:  10/30/23 237 lb (107.5 kg)  10/11/23 237 lb 4 oz (107.6 kg)  10/08/23 239 lb (108.4 kg)   BP Readings from Last 3 Encounters:  11/07/23 124/74  10/30/23 (!) 145/74  10/14/23 (!) 148/80   Pulse Readings from Last 3 Encounters:  11/07/23 67  10/30/23 63  10/14/23 (!) 58    Renal function: Estimated Creatinine Clearance: 63.9 mL/min (A) (by C-G formula based on SCr of 1.38 mg/dL (H)).  Past Medical History:  Diagnosis  Date   Allergy    BACK PAIN    ELEVATED BP READING WITHOUT DX HYPERTENSION 05/03/2008   Qualifier: Diagnosis of  By: Amon MD, Aloysius BRAVO.    GERD (gastroesophageal reflux disease)    H/O cardiovascular stress test 2005    (-)   Hyperlipidemia    INSOMNIA-SLEEP DISORDER-UNSPEC    PVC (premature ventricular contraction)     Current Outpatient Medications on File Prior to Visit  Medication Sig Dispense Refill   amLODipine  (NORVASC ) 5 MG tablet Take 1 tablet (5 mg total) by mouth daily.     Ascorbic Acid (VITAMIN C) 1000 MG tablet Take 1,000 mg by mouth every evening.     carvedilol  (COREG ) 25 MG tablet Take 1 tablet (25 mg total) by mouth 2 (two) times daily. 180 tablet 3   chlorthalidone  (HYGROTON ) 25 MG tablet Take 1 tablet (25 mg total) by mouth daily. 90 tablet 3   Cholecalciferol (VITAMIN D ) 125 MCG (5000 UT) CAPS Take 1 capsule by mouth in the morning.     ezetimibe  (ZETIA ) 10 MG tablet TAKE 1 TABLET BY MOUTH EVERY DAY 90 tablet 2   Multiple Vitamin (MULTIVITAMIN) tablet Take 1 tablet by mouth every evening.     nitroGLYCERIN  (NITROSTAT ) 0.4 MG SL tablet Place 1 tablet (0.4 mg total) under the tongue every 5 (five) minutes x 3 doses as needed for chest pain. 25 tablet 3   pantoprazole  (PROTONIX ) 40 MG tablet TAKE 1 TABLET BY MOUTH EVERY DAY 90 tablet 2   Probiotic Product (PROBIOTIC DAILY PO) Take by mouth.     rosuvastatin  (CRESTOR ) 40 MG tablet Take 1 tablet (40 mg total) by mouth at bedtime. 90 tablet 2   No current facility-administered medications on file prior to visit.    No Known Allergies  Blood pressure 124/74, pulse 67, SpO2 96%.   Assessment/Plan:  1. Hypertension -  Essential (primary) hypertension Assessment: 1st BP reading 143/79 heart rate 67 2nd reading 124/74  Home BP >130 - 150/80- 89 mmHg - will be buying new BP monitor  Leg swelling improved now that he is on lower dose amlodipine , SrCr went up last BMP - post thiazide start however pt went for colonoscopy  4 day before BMP so will get updated BMP to assess SrCr   Notices occasional orthostatic dizziness  Denies SOB, palpitation, chest pain, headaches,or swelling Follows heart healthy diet and exercise regularly   Responced well on irbesartan  - developed GI intolerance so d/c'd it   Plan:  Continue taking current BP medications (carvedilol  25 mg twice daily, amlodipine  5 mg daily, chlorthalidone  25 mg daily) Will get updated BMP to get SrCr. If renal function back to baseline may consider changing thiazide diuretics to ARB agent since past good response were noted on irbesartan  (  potentially valsartan 80 mg twice daily)  Patient to keep record of BP readings with heart rate and report to us  at the next visit Patient to see PharmD in 4 weeks for follow up  Follow up lab(s): BMP today    Thank you  Robbi Blanch, Pharm.D [facility] of [facility] [address] [place], [place] [address]  Phone: [phone]; Fax: [phone]

## 2023-11-08 ENCOUNTER — Ambulatory Visit: Payer: Self-pay | Admitting: Pharmacist

## 2023-11-08 ENCOUNTER — Encounter: Payer: Self-pay | Admitting: Cardiology

## 2023-11-08 LAB — BASIC METABOLIC PANEL WITH GFR
BUN/Creatinine Ratio: 18 (ref 10–24)
BUN: 22 mg/dL (ref 8–27)
CO2: 23 mmol/L (ref 20–29)
Calcium: 9.5 mg/dL (ref 8.6–10.2)
Chloride: 99 mmol/L (ref 96–106)
Creatinine, Ser: 1.25 mg/dL (ref 0.76–1.27)
Glucose: 94 mg/dL (ref 70–99)
Potassium: 3.6 mmol/L (ref 3.5–5.2)
Sodium: 141 mmol/L (ref 134–144)
eGFR: 63 mL/min/1.73 (ref >59)

## 2023-11-08 NOTE — Telephone Encounter (Signed)
 Discussed the recent BMP results with the patient over the phone; serum creatinine is returning to baseline. We plan to collect home blood pressure readings over the next two weeks before initiating valsartan, potentially at 80 mg twice daily. If blood pressure remains at goal on current therapy, adding an ARB may not be necessary. The patient was advised to share home BP readings via MyChart and has a follow-up office visit scheduled in 4 weeks.

## 2023-12-03 ENCOUNTER — Ambulatory Visit: Attending: Physician Assistant | Admitting: Physician Assistant

## 2023-12-03 ENCOUNTER — Encounter: Payer: Self-pay | Admitting: Physician Assistant

## 2023-12-03 VITALS — BP 128/72 | HR 55 | Ht 71.0 in | Wt 231.0 lb

## 2023-12-03 DIAGNOSIS — I2510 Atherosclerotic heart disease of native coronary artery without angina pectoris: Secondary | ICD-10-CM | POA: Diagnosis not present

## 2023-12-03 DIAGNOSIS — I10 Essential (primary) hypertension: Secondary | ICD-10-CM | POA: Diagnosis not present

## 2023-12-03 DIAGNOSIS — E785 Hyperlipidemia, unspecified: Secondary | ICD-10-CM

## 2023-12-03 MED ORDER — ASPIRIN 81 MG PO TBEC: 81.0000 mg | DELAYED_RELEASE_TABLET | Freq: Every day | ORAL | Status: DC

## 2023-12-03 MED ORDER — ASPIRIN 81 MG PO TBEC: 81.0000 mg | DELAYED_RELEASE_TABLET | Freq: Every day | ORAL | 2 refills | Status: AC

## 2023-12-03 NOTE — Patient Instructions (Signed)
 Medication Instructions:  START TAKING ASPIRIN  81 MG DAILY.  Lab Work: NONE TO BE DONE TODAY.  Testing/Procedures: NONE  Your next appointment:   6 MONTHS  Provider:   Newman JINNY Lawrence, MD

## 2023-12-03 NOTE — Progress Notes (Signed)
 Cardiology Office Note   Date:  12/03/2023  ID:  Shane Wood, DOB 1956-01-15, MRN [id number] PCP: Amon Aloysius BRAVO, MD  [name], MD     History of Present Illness Shane Wood is a 68 y.o. male with past medical history of CAD, hypertension and hyperlipidemia.  Cardiac catheterization performed on 09/19/2017 by Dr. Dann showed 40% proximal RCA, occluded proximal and mid RCA treated with Synergy 4.0 x 12 mm DES, 75% mid RCA lesion treated with Synergy 2.75 x 38 mm stent, 90% RPDA lesion treated with another Synergy 2.5 x 16 mm stent, 100% occluded OM1 and OM 2, 80% mid left circumflex lesion, 50% proximal LAD lesion, EF 50 to 55%.  Echocardiogram at the time showed EF 60 to 65% with mild LVH, no regional wall motion abnormality, mild MR, mild to moderately dilated left atrium, mildly reduced RVEF.  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  Patient was last seen by Dr. Lawrence on 09/02/2023 at which time he was still recovering from hemorrhagic colitis and was having acute bloody diarrhea.  Irbesartan  and Plavix  were discontinued and plans to place the patient on 81 mg aspirin  in the future.  Amlodipine  was increased to 10 mg daily.  Patient presents today for follow-up.  He denies any significant chest discomfort recently.  He has not had any bloody diarrhea since May.  We will start on a 81 mg aspirin .  Blood pressures were very well-controlled.  Recent blood work showed stable renal function and electrolytes, normal red blood cell.  He has shortness of breath with more strenuous activity but not  with everyday activity.  Will continue with medical therapy.  EKG is unchanged.  He can follow-up with Dr. Lawrence in 6 months.  Will defer to PCP annual lipid panel as last year.  Last year's lipid panel showed very well-controlled cholesterol  ROS:   He denies chest pain, palpitations, dyspnea, pnd, orthopnea, n, v, dizziness, syncope, edema, weight gain, or early satiety. All other systems reviewed and are otherwise negative except as noted above.    Studies Reviewed      Cardiac Studies & Procedures   ______________________________________________________________________________________________ CARDIAC CATHETERIZATION  CARDIAC CATHETERIZATION 08/23/2017  Conclusion  Prox RCA lesion is 40% stenosed.  Prox RCA to Mid RCA lesion is 100% stenosed.A drug-eluting stent was successfully placed using a STENT SYNERGY DES 4X12.  Mid RCA lesion is 75% stenosed. A drug-eluting stent was successfully placed using a STENT SYNERGY DES [id number].  THe above stents overlap. Post intervention, there is a 0% residual stenosis.  Ost RPDA to RPDA lesion is 90% stenosed. A drug-eluting stent was successfully placed using a STENT SYNERGY DES 2.5X16.  Post intervention, there is a 0% residual stenosis.  Ost 1st Mrg lesion is 100% stenosed. Ost 2nd Mrg to 2nd Mrg lesion is 100% stenosed. These small vessels are filled by collaterals.  Mid Cx lesion is 80% stenosed.  Prox LAD lesion is 50% stenosed.  The left ventricular ejection fraction is 50-55% by visual estimate.  The left ventricular systolic function is normal.  LV end diastolic  pressure is normal.  There is a mild aortic valve gradient.  Continue dual antiplatelet therapy for at least one year.  Aggressive medical therapy including lipid lowering therapy and healthy diet.  Possible discharge tomorrow.  Prominent accesory radial branch.  Need fluoroscopic guidance to navigate right radial. AL1 guide needed to engage  RCA.  Findings Coronary Findings Diagnostic  Dominance: Right  Left Anterior Descending Prox LAD lesion is 50% stenosed.  Left Circumflex Mid Cx lesion is 80% stenosed. The lesion is eccentric.  First Obtuse Marginal Branch Collaterals 1st Mrg filled by collaterals from 3rd Mrg.  Ost 1st Mrg lesion is 100% stenosed.  Second Obtuse Marginal Branch Collaterals 2nd Mrg filled by collaterals from 3rd Mrg.  Ost 2nd Mrg to 2nd Mrg lesion is 100% stenosed.  Right Coronary Artery Prox RCA lesion is 40% stenosed. Prox RCA to Mid RCA lesion is 100% stenosed. Mid RCA lesion is 75% stenosed.  Right Posterior Descending Artery Collaterals RPDA filled by collaterals from 3rd Sept.  Ost RPDA to RPDA lesion is 90% stenosed.  Intervention  Prox RCA to Mid RCA lesion Stent Lesion crossed with guidewire using a WIRE ASAHI PROWATER 180CM. Pre-stent angioplasty was performed using a BALLOON SAPPHIRE 2.5X15. A drug-eluting stent was successfully placed using a STENT SYNERGY DES 4X12. Maximum pressure: 16 atm. Stent strut is well apposed. Stent overlaps previously placed stent. Post-stent angioplasty was not performed. Post-Intervention Lesion Assessment The intervention was successful. Pre-interventional TIMI flow is 0. Post-intervention TIMI flow is 3. No complications occurred at this lesion. There is a 0% residual stenosis post intervention.  Mid RCA lesion Stent Lesion crossed with guidewire using a WIRE ASAHI PROWATER 180CM. Pre-stent angioplasty was performed using a BALLOON SAPPHIRE 2.5X15. A drug-eluting stent was successfully placed using a STENT SYNERGY DES [id number]. Stent strut is well apposed. Post-stent angioplasty was performed using a BALLOON Gueydan EMERGE MR 3.25X20. Post-Intervention Lesion Assessment The intervention was successful. Pre-interventional TIMI flow is 3. Post-intervention TIMI flow is 3. No complications occurred at this lesion. There is a 0% residual stenosis  post intervention.  Ost RPDA to RPDA lesion Stent Lesion crossed with guidewire using a WIRE ASAHI PROWATER 180CM. Pre-stent angioplasty was performed using a BALLOON SAPPHIRE 2.5X15. A drug-eluting stent was successfully placed using a STENT SYNERGY DES 2.5X16. Stent strut is well apposed. Post-stent angioplasty was not performed. Post-Intervention Lesion Assessment The intervention was successful. Pre-interventional TIMI flow is 3. Post-intervention TIMI flow is 3. No complications occurred at this lesion. There is a 0% residual stenosis post intervention.     ECHOCARDIOGRAM  ECHOCARDIOGRAM COMPLETE 03/11/2021  Narrative ECHOCARDIOGRAM REPORT    Patient Name:   Shane Wood Date of Exam: 03/11/2021 Medical Rec #:  [id number]        Height:       71.0 in Accession #:    [id number]       Weight:       221.0 lb Date of Birth:  Oct 21, 1955         BSA:          2.200 m Patient Age:    65 years         BP:           179/89 mmHg Patient Gender: M                HR:           81 bpm. Exam Location:  Inpatient  Procedure: 2D Echo, 3D Echo, Cardiac Doppler  and Color Doppler  Indications:    TIA  History:        Patient has prior history of Echocardiogram examinations, most recent 08/24/2017. Previous Myocardial Infarction and CAD, Abnormal ECG; Arrythmias:PVC.  Sonographer:    Ellouise Mose RDCS Referring Phys: [id number] TIMOTHY S OPYD   Sonographer Comments: Interrupted for consult. IMPRESSIONS   1. Left ventricular ejection fraction, by estimation, is 60 to 65%. The left ventricle has normal function. The left ventricle has no regional wall motion abnormalities. There is mild left ventricular hypertrophy. Left ventricular diastolic parameters were normal. 2. Right ventricular systolic function is normal. The right ventricular size is normal. Tricuspid regurgitation signal is inadequate for assessing PA pressure. 3. There is a 1.7 x 1.3 cm semi mobile mass in the right atrium that  appears to be attached to the atrial septum (seen in subcostal views). Differential would include atrial myxoma or possible thrombus. With patient presenting with acute vision loss and possible stroke would recommend TEE to better evaluate mass and also to look for any potential right to left shunting that could connect the right atrial finding to his clinical presentation. 4. The mitral valve is normal in structure. No evidence of mitral valve regurgitation. No evidence of mitral stenosis. 5. The aortic valve has Shane indeterminant number of cusps. There is moderate calcification of the aortic valve. There is moderate thickening of the aortic valve. Aortic valve regurgitation is not visualized. No aortic stenosis is present. 6. Aortic dilatation noted. There is mild dilatation of the ascending aorta, measuring 38 mm. 7. The inferior vena cava is normal in size with greater than 50% respiratory variability, suggesting right atrial pressure of 3 mmHg.  FINDINGS Left Ventricle: Left ventricular ejection fraction, by estimation, is 60 to 65%. The left ventricle has normal function. The left ventricle has no regional wall motion abnormalities. The left ventricular internal cavity size was normal in size. There is mild left ventricular hypertrophy. Left ventricular diastolic parameters were normal.  Right Ventricle: The right ventricular size is normal. No increase in right ventricular wall thickness. Right ventricular systolic function is normal. Tricuspid regurgitation signal is inadequate for assessing PA pressure.  Left Atrium: Left atrial size was normal in size.  Right Atrium: There is a 1.7 x 1.3 cm semi mobile mass in the right atrium that appears to be attached to the atrial septum (seen in subcostal views). Differential would include atrial myxoma or possible thrombus. With patient presenting with acute vision loss and possible stroke would recommend TEE to better evaluate mass and also to look  for any potential right to left shunting that could connect the right atrial finding to his clinical presentation. Right atrial size was normal in size.  Pericardium: There is no evidence of pericardial effusion.  Mitral Valve: The mitral valve is normal in structure. No evidence of mitral valve regurgitation. No evidence of mitral valve stenosis.  Tricuspid Valve: The tricuspid valve is normal in structure. Tricuspid valve regurgitation is not demonstrated. No evidence of tricuspid stenosis.  Aortic Valve: The aortic valve has Shane indeterminant number of cusps. There is moderate calcification of the aortic valve. There is moderate thickening of the aortic valve. There is moderate aortic valve annular calcification. Aortic valve regurgitation is not visualized. No aortic stenosis is present. Aortic valve mean gradient measures 6.2 mmHg. Aortic valve peak gradient measures 11.7 mmHg. Aortic valve area, by VTI measures 2.85 cm.  Pulmonic Valve: The pulmonic valve was not well visualized. Pulmonic valve regurgitation is  not visualized. No evidence of pulmonic stenosis.  Aorta: The aortic root is normal in size and structure and aortic dilatation noted. There is mild dilatation of the ascending aorta, measuring 38 mm.  Venous: The inferior vena cava is normal in size with greater than 50% respiratory variability, suggesting right atrial pressure of 3 mmHg.  IAS/Shunts: No atrial level shunt detected by color flow Doppler.   LEFT VENTRICLE PLAX 2D LVIDd:         4.30 cm     Diastology LVIDs:         2.80 cm     LV e' medial:    8.59 cm/s LV PW:         1.10 cm     LV E/e' medial:  9.1 LV IVS:        1.10 cm     LV e' lateral:   12.00 cm/s LVOT diam:     2.40 cm     LV E/e' lateral: 6.5 LV SV:         97 LV SV Index:   44 LVOT Area:     4.52 cm  LV Volumes (MOD) LV vol d, MOD A2C: 71.1 ml LV vol d, MOD A4C: 79.8 ml LV vol s, MOD A2C: 25.8 ml LV vol s, MOD A4C: 30.3 ml LV SV MOD A2C:      45.3 ml LV SV MOD A4C:     79.8 ml LV SV MOD BP:      48.9 ml  RIGHT VENTRICLE             IVC RV S prime:     12.20 cm/s  IVC diam: 2.10 cm TAPSE (M-mode): 2.3 cm  LEFT ATRIUM             Index        RIGHT ATRIUM           Index LA diam:        4.10 cm 1.86 cm/m   RA Area:     13.80 cm LA Vol (A2C):   38.3 ml 17.41 ml/m  RA Volume:   33.90 ml  15.41 ml/m LA Vol (A4C):   35.2 ml 16.00 ml/m LA Biplane Vol: 39.6 ml 18.00 ml/m AORTIC VALVE AV Area (Vmax):    3.44 cm AV Area (Vmean):   3.11 cm AV Area (VTI):     2.85 cm AV Vmax:           170.83 cm/s AV Vmean:          113.587 cm/s AV VTI:            0.341 m AV Peak Grad:      11.7 mmHg AV Mean Grad:      6.2 mmHg LVOT Vmax:         130.00 cm/s LVOT Vmean:        78.200 cm/s LVOT VTI:          0.215 m LVOT/AV VTI ratio: 0.63  AORTA Ao Root diam: 3.80 cm Ao Asc diam:  3.80 cm  MITRAL VALVE MV Area (PHT): 3.91 cm    SHUNTS MV Decel Time: 194 msec    Systemic VTI:  0.21 m MV E velocity: 78.50 cm/s  Systemic Diam: 2.40 cm MV A velocity: 89.70 cm/s MV E/A ratio:  0.88  Dorn Ross MD Electronically signed by Dorn Ross MD Signature Date/Time: 03/11/2021/2:07:29 PM    Final   TEE  ECHO TEE 03/24/2021  Narrative TRANSESOPHOGEAL ECHO REPORT    Patient Name:   Shane Wood Date of Exam: 03/24/2021 Medical Rec #:  [id number]        Height:       71.0 in Accession #:    [id number]       Weight:       220.9 lb Date of Birth:  06-May-1955         BSA:          2.200 m Patient Age:    65 years         BP:           114/56 mmHg Patient Gender: M                HR:           64 bpm. Exam Location:  Inpatient  Procedure: Transesophageal Echo, Color Doppler and Cardiac Doppler  Indications:     Right atrial mass.  History:         Patient has prior history of Echocardiogram examinations, most recent 03/11/2021.  Sonographer:     Charlie Jointer RDCS Referring Phys:  [id number] CANDYCE GORMAN REEK Diagnosing Phys: Wilbert Bihari MD  PROCEDURE: After discussion of the risks and benefits of a TEE, Shane informed consent was obtained from the patient. The transesophogeal probe was passed without difficulty through the esophogus of the patient. Imaged were obtained with the patient in a left lateral decubitus position. Sedation performed by different physician. The patient was monitored while under deep sedation. Anesthestetic sedation was provided intravenously by Anesthesiology: 230mg  of Propofol . Image quality was technically difficult. The patient's vital signs; including heart rate, blood pressure, and oxygen saturation; remained stable throughout the procedure. The patient developed no complications during the procedure.  IMPRESSIONS   1. Left ventricular ejection fraction, by estimation, is 60 to 65%. The left ventricle has normal function. The left ventricle has no regional wall motion abnormalities. 2. Right ventricular systolic function is normal. The right ventricular size is normal. 3. No left atrial/left atrial appendage thrombus was detected. 4. The mitral valve is normal in structure. Trivial mitral valve regurgitation. No evidence of mitral stenosis. 5. The aortic valve is tricuspid. Aortic valve regurgitation is not visualized. Aortic valve sclerosis is present, with no evidence of aortic valve stenosis. 6. The inferior vena cava is normal in size with greater than 50% respiratory variability, suggesting right atrial pressure of 3 mmHg. 7. 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 Images of the RA are suboptimol due to significant shadowing from the lipomatous interatrial septum. No obvious RA mass or mass off the interatrial septum but poorly visualized. Recommend cMRI for further evaluation. This was discussed with Dr. REEK Aye  Conclusion(s)/Recommendation(s): Normal biventricular function without evidence of hemodynamically significant valvular  heart disease.  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 No obvious RA mass or mass off the interatrial septum but poorly visualized. I  FINDINGS Left Ventricle: Left ventricular ejection fraction, by estimation, is 60 to 65%. The left ventricle has normal function. The left ventricle has no regional wall motion abnormalities. The left ventricular internal cavity size was normal in size. There is no left ventricular hypertrophy.  Right Ventricle: The right ventricular size is normal. No increase in right ventricular wall thickness. Right ventricular systolic function is normal.  Left Atrium: Left atrial size was normal in  size. No left atrial/left atrial appendage thrombus was detected.  Right Atrium: Right atrial size was not well visualized.  Pericardium: There is no evidence of pericardial effusion.  Mitral Valve: The mitral valve is normal in structure. Trivial mitral valve regurgitation. No evidence of mitral valve stenosis.  Tricuspid Valve: The tricuspid valve is normal in structure. Tricuspid valve regurgitation is not demonstrated. No evidence of tricuspid stenosis.  Aortic Valve: The aortic valve is tricuspid. Aortic valve regurgitation is not visualized. Aortic valve sclerosis is present, with no evidence of aortic valve stenosis.  Pulmonic Valve: The pulmonic valve was normal in structure. Pulmonic valve regurgitation is trivial. No evidence of pulmonic stenosis.  Aorta: The aortic root is normal in size and structure.  Venous: The inferior vena cava is normal in size with greater than 50% respiratory variability, suggesting right atrial pressure of 3 mmHg.  IAS/Shunts: The interatrial septum appears to be lipomatous. No atrial level shunt detected by color flow Doppler. Agitated saline contrast was given intravenously to evaluate for intracardiac  shunting.  Wilbert Bihari MD Electronically signed by Wilbert Bihari MD Signature Date/Time: 03/31/2021/9:51:29 PM    Final  MONITORS  LONG TERM MONITOR (3-14 DAYS) 05/15/2023  Narrative Zio patch monitor 11 days 04/30/2023 - 05/11/2023: Dominant rhythm: Sinus. HR 48-144 bpm. Avg HR 74 bpm, at sinus rhythm. 1 episode of atrial tachycardia, at 194 bpm for 5 beats. <1% isolated SVE, couplet/triplets. 0 episodes of VT. <1% isolated VE, no couplet/triplets. No atrial fibrillation/atrial flutter/VT/high grade AV block, sinus pause >3sec noted. 3 patient triggered event, correlated with VE.     CARDIAC MRI  MR CARDIAC MORPHOLOGY W WO CONTRAST 05/16/2021  Narrative CLINICAL DATA:  Clinical question of interatrial mass Study assumes BSA of 2.3 m2.  EXAM: CARDIAC MRI  TECHNIQUE: The patient was scanned on a 1.5 Tesla GE magnet. A dedicated cardiac coil was used. Functional imaging was done using Fiesta sequences. 2,3, and 4 chamber views were done to assess for RWMA's. Modified Simpson's rule using a short axis stack was used to calculate Shane ejection fraction on a dedicated work [employer]. The patient received 10 cc of Gadavist . After 10 minutes inversion recovery sequences were used to assess for infiltration and scar tissue.  CONTRAST:  10 cc  of Gadavist   FINDINGS: 1. Normal left ventricular size, with LVEDD 57 mm, and LVEDVi 71 mL/m2.  Asymmetric,concentric remodeling with intraventricular septal thickness of 14 mm, posterior wall thickness of 9 mm, and myocardial mass index of 60 g/m2.  Normal left ventricular systolic function (LVEF =64%). There are no regional wall motion abnormalities.  There is late gadolinium enhancement in the left ventricular myocardium. Subendocardial LGE in the basal inferior.  2.  Mild increase in right ventricular size with RVEDVI 90 mL/m2.  Normal right ventricular thickness.  Normal right ventricular systolic  function (RVEF =61%). There are no regional wall motion abnormalities or aneurysms.  3.  Normal left and right atrial size.  There is bilobar interatrial septal thickening.  There is high signal in the T1 dark blood sequence, with low signal in fat suppression sequence.  Low signal in T2 dark blood with no change in fat suppression sequence, but in the setting of notable artifact.  There is no late gadolinium enhancement in the interatrial septum.  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  4. Normal size of the aortic root, ascending aorta and pulmonary artery.  5. Valve assessment:  Aortic Valve:Tri-leaflet aortic valve. Qualitatively no significant regurgitation.  Pulmonic Valve: Qualitatively no significant regurgitation.  Tricuspid Valve: Qualitatively no significant regurgitation.  Mitral Valve: No significant regurgitation.  6.  Normal pericardium.  No pericardial effusion.  7. Grossly, no extracardiac findings. Recommended dedicated study if concerned for non-cardiac pathology.  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  Stanly Leavens MD   Electronically Signed By: Stanly Leavens M.D. On: 05/20/2021 14:48   ______________________________________________________________________________________________      Risk Assessment/Calculations           Physical Exam VS:  BP 128/72   Pulse (!) 55   Ht 5' 11 (1.803 m)   Wt 231 lb (104.8 kg)   SpO2 98%   BMI 32.22 kg/m        Wt Readings from Last 3 Encounters:  12/03/23 231 lb (104.8 kg)  10/30/23 237 lb (107.5 kg)  10/11/23 237 lb 4 oz (107.6 kg)    GEN: Well nourished, well developed in no acute distress NECK: No JVD; No carotid bruits CARDIAC: RRR, no murmurs, rubs, gallops RESPIRATORY:  Clear to auscultation without rales, wheezing or rhonchi   ABDOMEN: Soft, non-tender, non-distended EXTREMITIES:  No edema; No deformity   ASSESSMENT AND PLAN  CAD: Denies any recent chest pain.  Previous PCI was in 2019.  Continue aspirin  81 mg daily.  Hypertension: Blood pressure well-controlled.  Hyperlipidemia: On rosuvastatin  40 mg daily.       Dispo: Follow-up with Dr. Elmira in 6 months.  Signed, Scot Ford, PA

## 2023-12-16 NOTE — Progress Notes (Unsigned)
 Patient ID: [name]                 DOB: 1956-03-12                      MRN: [id number]      HPI: Shane Wood is a 68 y.o. male referred by Dr. Elmira to HTN clinic. PMH is significant for hypertension, hyperlipidemia, CAD, hx of NSTEMI  2 separate blood pressure readings elevated so patient was advised to keep home BP log and bring it to PharmD at follow up visit. At last visit with PharmD BP was elevated (150/80-on 04/22/23) amlodipine  5 mg was added to metoprolol  12.5 mg twice daily. Pt also reported skipped heart beats - Zio patch was ordered by Dr.Patwardhan.  at last visit with me BP was elevated so irbesartan  150 mg was added to amlodipine  and metoprolol  was changed to Carvedilol  for better BP response. Irbesartan  was added to other BP meds to lower BP to goal post Aavapro initiation BMP was WNL.  Patient had difficulty tolerating irbesartan  300 mg due to severe gastrointestinal upset. Medication was switched to amlodipine . However, doses >5 mg of amlodipine  led to lower extremity swelling. Currently on 7.5 mg amlodipine , with noted persistent swelling--more pronounced in the left leg.  Carvedilol  dose was recently increased from 6.25 mg BID to 25 mg BID (1 week ago). Patient reports that since the dose increase, exercise heart rate does not rise above 100 bpm. At last visit we reduce amlodipine  dose down to 5 mg from 7.5 mg to improve swelling. And added Chlorthalidone  25 mg daily. Post BMP SrCr went up almost 28%  Today, the patient presented for follow-up and reports that his home blood pressure readings remain elevated, generally between 130 and 150 mmHg systolic. He states that chlorthalidone  has not made a noticeable difference, and he has not experienced any diuretic effects from it. The patient recently underwent a colonoscopy a few days prior to his last BMP; an updated BMP will be obtained today. He responded well to irbesartan  but developed gastrointestinal intolerance.  If the updated BMP shows his serum creatinine has returned to baseline, we may consider replacing chlorthalidone  with valsartan 80 mg twice daily. The patient also believes his home blood pressure monitor consistently reads about 10 points higher than his office measurements, despite it being new; he plans to purchase a new monitor for accuracy. His swelling has improved, and he denies shortness of breath or palpitations but reports dizziness when changing positions.  Current HTN meds: carvedilol  25 mg twice daily, amlodipine  5 mg daily, chlorthalidone  25 mg daily  Previously tried: Irbesartan  300 mg - diarrhea and Norvasc  >5 mg dose -lower legs swelling  BP goal: <130/80     Family History:  Relation Problem Comments  Mother Metallurgist) Depression   Hypertension   Obesity     Father (Deceased) Lung cancer smoker    Maternal Grandmother (Deceased) Diabetes     Maternal Grandfather (Deceased) Diabetes     Paternal Grandmother (Deceased)   Paternal Grandfather (Deceased)   Other Heart attack uncle MI at age 37s    Other Stroke GM, in her 77      Social History:  Alcohol: 2 shots per day  Smoking: quit 30  yrs ago  Recreational drug use: never   Diet: low sat diet but still have canned food  Ocassionaly snack on salted nuts and chips  Drink: coffee 3-4 cups per day, water  -  rest of the day   Exercise: 60 min stationary bike daily, weight training 2-3 times per week     Home BP readings: does not check BP at home  Wt Readings from Last 3 Encounters:  12/03/23 231 lb (104.8 kg)  10/30/23 237 lb (107.5 kg)  10/11/23 237 lb 4 oz (107.6 kg)   BP Readings from Last 3 Encounters:  12/03/23 128/72  11/07/23 124/74  10/30/23 (!) 145/74   Pulse Readings from Last 3 Encounters:  12/03/23 (!) 55  11/07/23 67  10/30/23 63    Renal function: CrCl cannot be calculated (Patient's most recent lab result is older than the maximum 21 days allowed.).  Past Medical History:  Diagnosis  Date   Allergy    BACK PAIN    ELEVATED BP READING WITHOUT DX HYPERTENSION 05/03/2008   Qualifier: Diagnosis of  By: Amon MD, Shane Wood.    GERD (gastroesophageal reflux disease)    H/O cardiovascular stress test 2005    (-)   Hyperlipidemia    INSOMNIA-SLEEP DISORDER-UNSPEC    PVC (premature ventricular contraction)     Current Outpatient Medications on File Prior to Visit  Medication Sig Dispense Refill   amLODipine  (NORVASC ) 5 MG tablet Take 1 tablet (5 mg total) by mouth daily.     Ascorbic Acid (VITAMIN C) 1000 MG tablet Take 1,000 mg by mouth every evening.     aspirin  EC 81 MG tablet Take 1 tablet (81 mg total) by mouth daily. Swallow whole. 90 tablet 2   carvedilol  (COREG ) 25 MG tablet Take 1 tablet (25 mg total) by mouth 2 (two) times daily. 180 tablet 3   chlorthalidone  (HYGROTON ) 25 MG tablet Take 1 tablet (25 mg total) by mouth daily. 90 tablet 3   Cholecalciferol (VITAMIN D ) 125 MCG (5000 UT) CAPS Take 1 capsule by mouth in the morning.     ezetimibe  (ZETIA ) 10 MG tablet TAKE 1 TABLET BY MOUTH EVERY DAY 90 tablet 2   Multiple Vitamin (MULTIVITAMIN) tablet Take 1 tablet by mouth every evening.     nitroGLYCERIN  (NITROSTAT ) 0.4 MG SL tablet Place 1 tablet (0.4 mg total) under the tongue every 5 (five) minutes x 3 doses as needed for chest pain. 25 tablet 3   pantoprazole  (PROTONIX ) 40 MG tablet TAKE 1 TABLET BY MOUTH EVERY DAY 90 tablet 2   Probiotic Product (PROBIOTIC DAILY PO) Take by mouth.     rosuvastatin  (CRESTOR ) 40 MG tablet Take 1 tablet (40 mg total) by mouth at bedtime. 90 tablet 2   No current facility-administered medications on file prior to visit.    No Known Allergies  There were no vitals taken for this visit.   Assessment/Plan:  1. Hypertension -  No problem-specific Assessment & Plan notes found for this encounter.   Thank you  Shane Wood, Pharm.D [facility] A Division of [facility] [address],  [place], [place] [address]  Phone: [phone]; Fax: [phone]

## 2023-12-17 ENCOUNTER — Ambulatory Visit: Attending: Cardiology | Admitting: Pharmacist

## 2023-12-17 ENCOUNTER — Encounter: Payer: Self-pay | Admitting: Pharmacist

## 2023-12-17 VITALS — BP 115/73 | HR 57

## 2023-12-17 DIAGNOSIS — I10 Essential (primary) hypertension: Secondary | ICD-10-CM

## 2023-12-17 NOTE — Assessment & Plan Note (Signed)
 Assessment: In office blood pressure of 115/73 mmHg and heart rate of 57 bpm, well below the goal of <130/80 mmHg.  Home systolic blood pressure readings vary widely from the 125 to 160 range, with diastolic pressures between 70 and 83 mmHg.  Patient reports feeling well and tolerating current antihypertensive medications without issues.  Recent office measurement by another provider showed blood pressure at goal.  Variable stress levels may be contributing to intermittent elevated readings.  Patient is mindful of salt intake and willing to further reduce it. Plan: No medication changes at this time given satisfactory office readings and patient preference Continue monitoring home blood pressure regularly Reinforce lifestyle modifications, including further salt reduction and stress management If home blood pressure readings remain persistently elevated, consider adding an angiotensin receptor blocker (ARB) in the future Routine follow-up as scheduled or sooner if concerns arise

## 2023-12-17 NOTE — Patient Instructions (Signed)
 No Changes made to  by your pharmacist Robbi Blanch, PharmD at today's visit:    Instructions/Changes  (what do you need to do) Your Notes  (what you did and when you did it)  1.   2.   3.    Bring all of your meds, your BP cuff and your record of home blood pressures to your next appointment.    HOW TO TAKE YOUR BLOOD PRESSURE AT HOME  Rest 5 minutes before taking your blood pressure.  Don't smoke or drink caffeinated beverages for at least 30 minutes before. Take your blood pressure before (not after) you eat. Sit comfortably with your back supported and both feet on the floor (don't cross your legs). Elevate your arm to heart level on a table or a desk. Use the proper sized cuff. It should fit smoothly and snugly around your bare upper arm. There should be enough room to slip a fingertip under the cuff. The bottom edge of the cuff should be 1 inch above the crease of the elbow. Ideally, take 3 measurements at one sitting and record the average.  Important lifestyle changes to control high blood pressure  Intervention  Effect on the BP  Lose extra pounds and watch your waistline Weight loss is one of the most effective lifestyle changes for controlling blood pressure. If you're overweight or obese, losing even a small amount of weight can help reduce blood pressure. Blood pressure might go down by about 1 millimeter of mercury (mm Hg) with each kilogram (about 2.2 pounds) of weight lost.  Exercise regularly As a general goal, aim for at least 30 minutes of moderate physical activity every day. Regular physical activity can lower high blood pressure by about 5 to 8 mm Hg.  Eat a healthy diet Eating a diet rich in whole grains, fruits, vegetables, and low-fat dairy products and low in saturated fat and cholesterol. A healthy diet can lower high blood pressure by up to 11 mm Hg.  Reduce salt (sodium) in your diet Even a small reduction of sodium in the diet can improve heart health  and reduce high blood pressure by about 5 to 6 mm Hg.  Limit alcohol One drink equals 12 ounces of beer, 5 ounces of wine, or 1.5 ounces of 80-proof liquor.  Limiting alcohol to less than one drink a day for women or two drinks a day for men can help lower blood pressure by about 4 mm Hg.   If you have any questions or concerns please use My Chart to send questions or call the office at [phone]

## 2023-12-19 NOTE — Addendum Note (Signed)
 Addended by: Rhondalyn Clingan K on: 12/19/2023 11:11 AM   Modules accepted: Level of Service

## 2024-01-02 ENCOUNTER — Other Ambulatory Visit: Payer: Self-pay | Admitting: Internal Medicine

## 2024-01-15 ENCOUNTER — Encounter: Payer: Self-pay | Admitting: Internal Medicine

## 2024-01-15 ENCOUNTER — Ambulatory Visit (INDEPENDENT_AMBULATORY_CARE_PROVIDER_SITE_OTHER): Payer: Medicare PPO | Admitting: Internal Medicine

## 2024-01-15 VITALS — BP 116/72 | HR 59 | Temp 97.8°F | Resp 16 | Ht 71.0 in | Wt 236.2 lb

## 2024-01-15 DIAGNOSIS — Z0000 Encounter for general adult medical examination without abnormal findings: Secondary | ICD-10-CM

## 2024-01-15 DIAGNOSIS — Z0001 Encounter for general adult medical examination with abnormal findings: Secondary | ICD-10-CM | POA: Diagnosis not present

## 2024-01-15 DIAGNOSIS — R739 Hyperglycemia, unspecified: Secondary | ICD-10-CM

## 2024-01-15 DIAGNOSIS — E66811 Obesity, class 1: Secondary | ICD-10-CM

## 2024-01-15 DIAGNOSIS — Z6832 Body mass index (BMI) 32.0-32.9, adult: Secondary | ICD-10-CM | POA: Diagnosis not present

## 2024-01-15 DIAGNOSIS — I10 Essential (primary) hypertension: Secondary | ICD-10-CM

## 2024-01-15 DIAGNOSIS — E782 Mixed hyperlipidemia: Secondary | ICD-10-CM | POA: Diagnosis not present

## 2024-01-15 DIAGNOSIS — N522 Drug-induced erectile dysfunction: Secondary | ICD-10-CM

## 2024-01-15 LAB — LIPID PANEL
Cholesterol: 127 mg/dL (ref 0–200)
HDL: 41.6 mg/dL (ref >39.00)
LDL Cholesterol: 40 mg/dL (ref 0–99)
NonHDL: 85.44
Total CHOL/HDL Ratio: 3
Triglycerides: 229 mg/dL — ABNORMAL HIGH (ref 0.0–149.0)
VLDL: 45.8 mg/dL — ABNORMAL HIGH (ref 0.0–40.0)

## 2024-01-15 LAB — HEMOGLOBIN A1C: Hgb A1c MFr Bld: 6.3 % (ref 4.6–6.5)

## 2024-01-15 LAB — PSA: PSA: 0.63 ng/mL (ref 0.10–4.00)

## 2024-01-15 LAB — TSH: TSH: 1.54 u[IU]/mL (ref 0.35–5.50)

## 2024-01-15 MED ORDER — SILDENAFIL CITRATE 20 MG PO TABS: 60.0000 mg | ORAL_TABLET | Freq: Every evening | ORAL | 3 refills | Status: AC | PRN

## 2024-01-15 NOTE — Patient Instructions (Signed)
 Try sildenafil  as discussed, 1 hour before intercourse. Never take nitroglycerin  after you take sildenafil .  Continue checking your blood pressure regularly Blood pressure goal:  between 110/65 and  135/85. If it is consistently higher or lower, let me know     GO TO THE LAB :  Get the blood work   Your results will be posted on MyChart with my comments  Go to the front desk for the checkout Please make an appointment in 1 year for another physical exam

## 2024-01-15 NOTE — Assessment & Plan Note (Signed)
 Here for CPX - Td 2017 - s/p zostavax ; s/p  shingrix; PNM 23: 11/2018;  PNM 20: 2022; s/p RSV   - plans to get a  flu and covid booster --CCS:  Colonoscopy 09/2010, cscope again 11-2016, had colitis 08/2023, Cscope 12/31/2023, next 1 year per pt    --Prostate ca screening:   no symptoms, check PSA --Exercise,diet: Discussed, see comments under obesity - labs see orders

## 2024-01-15 NOTE — Progress Notes (Signed)
 Subjective:    Patient ID: Shane Wood, male    DOB: 06/06/1955, 68 y.o.   MRN: [id number]  DOS:  01/15/2024 Type of visit - description: cpx  Here for CPX. Chronic medical problems addressed. He denies chest pain or difficulty breathing. No claudication Reports difficulty with erections for few months.  Decreased quality, frequency.  Libido is okay.  BP Readings from Last 3 Encounters:  01/15/24 116/72  12/17/23 115/73  12/03/23 128/72   Wt Readings from Last 3 Encounters:  01/15/24 236 lb 4 oz (107.2 kg)  12/03/23 231 lb (104.8 kg)  10/30/23 237 lb (107.5 kg)     Review of Systems See above   Past Medical History:  Diagnosis Date   Allergy    BACK PAIN    ELEVATED BP READING WITHOUT DX HYPERTENSION 05/03/2008   Qualifier: Diagnosis of  By: Amon MD, Aloysius BRAVO.    GERD (gastroesophageal reflux disease)    H/O cardiovascular stress test 2005    (-)   Hyperlipidemia    INSOMNIA-SLEEP DISORDER-UNSPEC    PVC (premature ventricular contraction)     Past Surgical History:  Procedure Laterality Date   ARTERY REPAIR Right 08/23/2017   Procedure: EXPLORATION RIGHT RADIAL ARTERY,  RIGHT FOREARM FASCIOTOMY;  Surgeon: Harvey Carlin BRAVO, MD;  Location: [facility] OR;  Service: Vascular;  Laterality: Right;   BUBBLE STUDY  03/24/2021   Procedure: BUBBLE STUDY;  Surgeon: Shlomo Wilbert SAUNDERS, MD;  Location: [facility] ENDOSCOPY;  Service: Cardiovascular;;   CHOLECYSTECTOMY     CORONARY STENT INTERVENTION N/A 08/23/2017   Procedure: CORONARY STENT INTERVENTION;  Surgeon: Dann Candyce RAMAN, MD;  Location: [facility] INVASIVE CV LAB;  Service: Cardiovascular;  Laterality: N/A;   LEFT HEART CATH AND CORONARY ANGIOGRAPHY N/A 08/23/2017   Procedure: LEFT HEART CATH AND CORONARY ANGIOGRAPHY;  Surgeon: Dann Candyce RAMAN, MD;  Location: [facility] INVASIVE CV LAB;  Service: Cardiovascular;  Laterality: N/A;   POLYPECTOMY     TEE WITHOUT CARDIOVERSION N/A 03/24/2021   Procedure: TRANSESOPHAGEAL ECHOCARDIOGRAM (TEE);   Surgeon: Shlomo Wilbert SAUNDERS, MD;  Location: [facility] ENDOSCOPY;  Service: Cardiovascular;  Laterality: N/A;   TONSILLECTOMY     age 25 y/o    Current Outpatient Medications  Medication Instructions   amLODipine  (NORVASC ) 5 mg, Oral, Daily   aspirin  EC 81 mg, Oral, Daily, Swallow whole.   carvedilol  (COREG ) 25 mg, Oral, 2 times daily   chlorthalidone  (HYGROTON ) 25 mg, Oral, Daily   Cholecalciferol (VITAMIN D ) 125 MCG (5000 UT) CAPS 1 capsule, Every morning   ezetimibe  (ZETIA ) 10 mg, Oral, Daily   Multiple Vitamin (MULTIVITAMIN) tablet 1 tablet, Every evening   nitroGLYCERIN  (NITROSTAT ) 0.4 mg, Sublingual, Every 5 min x3 PRN   pantoprazole  (PROTONIX ) 40 mg, Oral, Daily   Probiotic Product (PROBIOTIC DAILY PO) Take by mouth.   rosuvastatin  (CRESTOR ) 40 mg, Oral, Daily at bedtime   sildenafil  (REVATIO ) 60-80 mg, Oral, At bedtime PRN   vitamin C 1,000 mg, Every evening       Objective:   Physical Exam BP 116/72   Pulse (!) 59   Temp 97.8 F (36.6 C) (Oral)   Resp 16   Ht 5' 11 (1.803 m)   Wt 236 lb 4 oz (107.2 kg)   SpO2 96%   BMI 32.95 kg/m  General: Well developed, NAD, BMI noted Neck: No  thyromegaly  HEENT:  Normocephalic . Face symmetric, atraumatic Lungs:  CTA B Normal respiratory effort, no intercostal retractions, no accessory muscle use. Heart: RRR,  no murmur.  Abdomen:  Not distended, soft, non-tender. No rebound or rigidity.   Lower extremities: no pretibial edema bilaterally  Skin: Exposed areas without rash. Not pale. Not jaundice Neurologic:  alert & oriented X3.  Speech normal, gait appropriate for age and unassisted Strength symmetric and appropriate for age.  Psych: Cognition and judgment appear intact.  Cooperative with normal attention span and concentration.  Behavior appropriate. No anxious or depressed appearing.     Assessment   Assessment  HTN Hyperlipidemia CV: --CAD: Non-STEMI 08/23/2017, 2 stents --L Branch retinal artery occlusion  (02-2022) -- PVCs -palpitations-- BB prn, sx usually related to anxiety Anxiety, insomnia   PLAN Here for CPX - Td 2017 - s/p zostavax ; s/p  shingrix; PNM 23: 11/2018;  PNM 20: 2022; s/p RSV   - plans to get a  flu and covid booster --CCS:  Colonoscopy 09/2010, cscope again 11-2016, had colitis 08/2023, Cscope 12/31/2023, next 1 year per pt    --Prostate ca screening:   no symptoms, check PSA --Exercise,diet: Discussed, see comments under obesity - labs see orders  Other issues addressed: Hyperlipidemia: On rosuvastatin , Zetia .  Checking labs. HTN: Since last visit, develop HTN, beta-blocker  changed to carvedilol  also the added chlorthalidone  and amlodipine . In the last few weeks BPs has been in the 130s.  At the office BPs are great.  No change. CAD: Follow-up by cardiology, denies symptoms. ED: New issue, patient is frustrated and request treatment.  I talk about the pros and cons of sildenafil , we had a long conversation about the risk of taking nitroglycerin  after sildenafil  including death.  He verbalized complete understanding of that and we agreed to try sildenafil  as needed. Obesity: BMI 32, with CAD, high cholesterol, hyperglycemia.  Unable to lose weight despite what he describes as a reasonable diet and exercising regularly.  We talk about GLP-1's pros and cons, cost.  Plan: Refer to nutritionist, come back in 3 months if unable to lose weight.  GLP-1's?SABRA RTC 1 year

## 2024-01-15 NOTE — Assessment & Plan Note (Signed)
 Here for CPX   Other issues addressed: Hyperlipidemia: On rosuvastatin , Zetia .  Checking labs. HTN: Since last visit, develop HTN, beta-blocker  changed to carvedilol  also the added chlorthalidone  and amlodipine . In the last few weeks BPs has been in the 130s.  At the office BPs are great.  No change. CAD: Follow-up by cardiology, denies symptoms. ED: New issue, patient is frustrated and request treatment.  I talk about the pros and cons of sildenafil , we had a long conversation about the risk of taking nitroglycerin  after sildenafil  including death.  He verbalized complete understanding of that and we agreed to try sildenafil  as needed. Obesity: BMI 32, with CAD, high cholesterol, hyperglycemia.  Unable to lose weight despite what he describes as a reasonable diet and exercising regularly.  We talk about GLP-1's pros and cons, cost.  Plan: Refer to nutritionist, come back in 3 months if unable to lose weight.  GLP-1's?SABRA RTC 1 year

## 2024-01-17 ENCOUNTER — Ambulatory Visit: Payer: Self-pay | Admitting: Internal Medicine

## 2024-01-17 ENCOUNTER — Encounter: Payer: Self-pay | Admitting: Internal Medicine

## 2024-02-09 ENCOUNTER — Encounter: Payer: Self-pay | Admitting: Internal Medicine

## 2024-02-10 ENCOUNTER — Telehealth: Payer: Self-pay

## 2024-02-10 ENCOUNTER — Other Ambulatory Visit (HOSPITAL_COMMUNITY): Payer: Self-pay

## 2024-02-10 NOTE — Telephone Encounter (Signed)
 Pharmacy Patient Advocate Encounter   Received notification from Patient Advice Request messages that prior authorization for Sildenafil  20mg  tabs is required/requested.   Insurance verification completed.   The patient is insured through [employer].   Per test claim: Per test claim, medication is not covered due to plan/benefit exclusion, PA not submitted at this time   Medicare Part D does not cover Sildenafil  for ED. With GoodRx card, 30 tablets would cost $28.73 at [facility]

## 2024-02-20 ENCOUNTER — Other Ambulatory Visit: Payer: Self-pay | Admitting: Medical Genetics

## 2024-02-20 DIAGNOSIS — Z006 Encounter for examination for normal comparison and control in clinical research program: Secondary | ICD-10-CM

## 2024-03-30 ENCOUNTER — Other Ambulatory Visit: Payer: Self-pay | Admitting: Cardiology

## 2024-10-08 ENCOUNTER — Ambulatory Visit

## 2025-01-15 ENCOUNTER — Encounter: Admitting: Internal Medicine
